# Patient Record
Sex: Male | Born: 1940 | Race: White | Hispanic: No | Smoking: Former smoker
Health system: Southern US, Community
[De-identification: ages and names within clinical notes are randomized; demographics above are authoritative.]

## PROBLEM LIST (undated history)

## (undated) DIAGNOSIS — M109 Gout, unspecified: Secondary | ICD-10-CM

## (undated) DIAGNOSIS — G4700 Insomnia, unspecified: Secondary | ICD-10-CM

## (undated) DIAGNOSIS — K219 Gastro-esophageal reflux disease without esophagitis: Secondary | ICD-10-CM

## (undated) DIAGNOSIS — I700 Atherosclerosis of aorta: Secondary | ICD-10-CM

## (undated) DIAGNOSIS — E669 Obesity, unspecified: Secondary | ICD-10-CM

## (undated) DIAGNOSIS — I7121 Aneurysm of the ascending aorta, without rupture: Secondary | ICD-10-CM

## (undated) DIAGNOSIS — R9431 Abnormal electrocardiogram [ECG] [EKG]: Secondary | ICD-10-CM

## (undated) DIAGNOSIS — F419 Anxiety disorder, unspecified: Secondary | ICD-10-CM

## (undated) DIAGNOSIS — I2510 Atherosclerotic heart disease of native coronary artery without angina pectoris: Secondary | ICD-10-CM

## (undated) DIAGNOSIS — I10 Essential (primary) hypertension: Secondary | ICD-10-CM

## (undated) DIAGNOSIS — Z87442 Personal history of urinary calculi: Secondary | ICD-10-CM

## (undated) DIAGNOSIS — E785 Hyperlipidemia, unspecified: Secondary | ICD-10-CM

## (undated) DIAGNOSIS — I712 Thoracic aortic aneurysm, without rupture: Secondary | ICD-10-CM

## (undated) HISTORY — DX: Abnormal electrocardiogram (ECG) (EKG): R94.31

## (undated) HISTORY — DX: Thoracic aortic aneurysm, without rupture: I71.2

## (undated) HISTORY — DX: Anxiety disorder, unspecified: F41.9

## (undated) HISTORY — DX: Atherosclerotic heart disease of native coronary artery without angina pectoris: I25.10

## (undated) HISTORY — PX: KIDNEY STONE SURGERY: SHX686

## (undated) HISTORY — DX: Hyperlipidemia, unspecified: E78.5

## (undated) HISTORY — DX: Aneurysm of the ascending aorta, without rupture: I71.21

## (undated) HISTORY — DX: Personal history of urinary calculi: Z87.442

## (undated) HISTORY — DX: Essential (primary) hypertension: I10

## (undated) HISTORY — DX: Insomnia, unspecified: G47.00

## (undated) HISTORY — DX: Gout, unspecified: M10.9

## (undated) HISTORY — DX: Obesity, unspecified: E66.9

## (undated) HISTORY — DX: Gastro-esophageal reflux disease without esophagitis: K21.9

## (undated) HISTORY — PX: HERNIA REPAIR: SHX51

## (undated) HISTORY — DX: Atherosclerosis of aorta: I70.0

---

## 1999-01-18 ENCOUNTER — Ambulatory Visit (HOSPITAL_COMMUNITY): Admission: RE | Admit: 1999-01-18 | Discharge: 1999-01-18 | Payer: Self-pay | Admitting: Urology

## 1999-01-18 ENCOUNTER — Encounter: Payer: Self-pay | Admitting: Urology

## 2001-07-06 ENCOUNTER — Observation Stay (HOSPITAL_COMMUNITY): Admission: EM | Admit: 2001-07-06 | Discharge: 2001-07-07 | Payer: Self-pay | Admitting: Emergency Medicine

## 2001-07-06 ENCOUNTER — Encounter: Payer: Self-pay | Admitting: Emergency Medicine

## 2001-07-09 ENCOUNTER — Encounter: Admission: RE | Admit: 2001-07-09 | Discharge: 2001-07-09 | Payer: Self-pay | Admitting: Family Medicine

## 2001-09-30 ENCOUNTER — Other Ambulatory Visit: Admission: RE | Admit: 2001-09-30 | Discharge: 2001-09-30 | Payer: Self-pay | Admitting: Gastroenterology

## 2001-09-30 ENCOUNTER — Encounter (INDEPENDENT_AMBULATORY_CARE_PROVIDER_SITE_OTHER): Payer: Self-pay | Admitting: Specialist

## 2007-08-18 ENCOUNTER — Ambulatory Visit (HOSPITAL_COMMUNITY): Admission: RE | Admit: 2007-08-18 | Discharge: 2007-08-18 | Payer: Self-pay | Admitting: Orthopedic Surgery

## 2010-08-01 ENCOUNTER — Ambulatory Visit (HOSPITAL_COMMUNITY): Admission: RE | Admit: 2010-08-01 | Discharge: 2010-08-01 | Payer: Self-pay | Admitting: Ophthalmology

## 2010-08-22 ENCOUNTER — Ambulatory Visit (HOSPITAL_COMMUNITY): Admission: RE | Admit: 2010-08-22 | Discharge: 2010-08-22 | Payer: Self-pay | Admitting: Ophthalmology

## 2011-02-14 LAB — HEMOGLOBIN AND HEMATOCRIT, BLOOD
HCT: 39.7 % (ref 39.0–52.0)
Hemoglobin: 13.4 g/dL (ref 13.0–17.0)

## 2011-02-14 LAB — BASIC METABOLIC PANEL
BUN: 11 mg/dL (ref 6–23)
CO2: 26 mEq/L (ref 19–32)
Calcium: 9.1 mg/dL (ref 8.4–10.5)
Chloride: 105 mEq/L (ref 96–112)
Creatinine, Ser: 1 mg/dL (ref 0.4–1.5)
GFR calc Af Amer: >60 mL/min (ref >60)
GFR calc non Af Amer: >60 mL/min (ref >60)
Glucose, Bld: 96 mg/dL (ref 70–99)
Potassium: 3.8 mEq/L (ref 3.5–5.1)
Sodium: 138 mEq/L (ref 135–145)

## 2011-04-15 NOTE — Op Note (Signed)
NAMESTEPFON, Joseph [name]                  ACCOUNT NO.:  [id number]   MEDICAL RECORD NO.:  [id number]          PATIENT TYPE:  AMB   LOCATION:  DAY                          FACILITY:  [facility]   PHYSICIAN:  Ronald A. Gioffre, M.D.DATE OF BIRTH:  January 18, 1941   DATE OF PROCEDURE:  08/18/2007  DATE OF DISCHARGE:                               OPERATIVE REPORT   SURGEON:  Dr. Darrelyn Hillock.   ASSISTANT:  Nurse.   PREOPERATIVE DIAGNOSES:  1. Partial tear of the quadriceps tendon right knee.  2. Torn medial meniscus right knee.  3. 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   POSTOPERATIVE DIAGNOSES:  1. Partial tear of the quadriceps tendon right knee.  2. Torn medial meniscus right knee.  3. 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   OPERATION:  1. Diagnostic arthroscopy right knee.  2. Medial meniscectomy right knee.  3. Abrasion chondroplasty medial femoral condyle right knee.   DESCRIPTION OF PROCEDURE:  Under general anesthesia, routine orthopedic  prep and drape of the right lower extremity was carried out.  He had 1  gram of IV Ancef.  A small punctate incision was made in the  suprapatellar pouch, inflow cannula was inserted, knee was distended  with saline.  Another small punctate incision made in the anterolateral  joint.  The arthroscope was entered from a lateral approach and a  complete diagnostic arthroscopy was carried out.  I went up into the  suprapatellar pouch, there was no obvious complete disruption of the  quadriceps tendon.  MRI showed the same. It just showed a small tear  that was really a nonoperative problem. We went down to the lateral  joint.  The lateral joint was clean, it looked fine, meniscus was  intact.  The anterior cruciate and posterior cruciates were definitely  intact. I probed those and took a photo of those.  I went over the  medial joint.  He had obvious degenerative changes in the medial femoral  condyle or it could have just been  a chondral-type defect from the fall  that he had.  Anyway, I introduced the shaver suction device and did an  abrasion chondroplasty.  Following that I went down and probed the  medial meniscus. As the medial meniscus was split from the posterior up  anteriorly, I simply shaved out the torn portion and left the normal  portion intact.  I probed the rim of the meniscus as well as the entire  meniscus and made sure there were no loose pieces, there were not.  I  thoroughly irrigated out the area, removed the fluid, closed all three  punctate incisions with 3-0 nylon suture and injected 30 mL of 0.5%  Marcaine with epinephrine in the knee joint and a sterile Neosporin  dressing was applied.  1. Postop he will be on aspirin 325 mg one twice a day starting today      and for a 2 weeks.  2. He will be on Percocet 10/650 one every 4 hours p.r.n. for pain.  3. He will be on crutches, partial to full weightbearing.  4. I will see him in 10-12 days for follow-up.           ______________________________  Georges Lynch. Darrelyn Hillock, M.D.     RAG/MEDQ  D:  08/18/2007  T:  08/18/2007  Job:  [id number]

## 2011-04-18 NOTE — Discharge Summary (Signed)
[place]  Patient:    Joseph [name], Joseph [name]                         MRN: [id number] Adm. Date:  [id number] Disc. Date: [id number] Attending:  Doneta Public Dictator:   Michell Heinrich, M.D. CC:         Monica Becton, M.D., [facility].   Discharge Summary  ADMISSION DIAGNOSIS:  Atypical chest pain.  DISCHARGE DIAGNOSIS:  Atypical chest pain, ruled out for myocardial infarction.  DISCHARGE MEDICATIONS: 1. Protonix 40 mg 1 tablet daily. 2. Aspirin 81 mg 1 tablet daily.  CONSULTATIONS:  None.  PROCEDURES:  Two-dimensional echo done on July 07, 2001, to evaluate ejection fraction and wall motion.  Results are pending at the time of discharge.  HISTORY OF PRESENT ILLNESS:  For complete H&P, please see resident H&P on chart.  Briefly, this is a 70 year old white man with no prior cardiac history.  He presented from [facility] with complaint of chest pain and shortness of breath.  Initial evaluation at [facility] did reveal asymmetrical right lower extremity swelling, and there was worry about possible pulmonary embolism.  He was evaluated in our emergency department and received a spiral CT to rule out pulmonary embolism.  The spiral CT showed no pulmonary embolism and extended down to the legs and ruled out DVT.  Further evaluation in the emergency department consisted of cardiac enzymes and an EKG.  Cardiac enzymes were negative, and EKG showed T-wave inversion in lead V1 and in lead III, which matched an EKG which was done earlier in the day at [facility]. No prior EKG for comparison.  Given the presentation and the patients age and family history of coronary artery disease, he was admitted for further rule out of myocardial infarction and possible risk stratification.  HOSPITAL COURSE: #1 - CHEST PAIN:  The patient became chest pain-free without nitroglycerin  or morphine.  He was admitted to a telemetry unit, and serial cardiac enzymes were obtained which were all negative.  Repeat EKG on the morning after admission was unchanged from admission EKG.  Due to his history of increasing shortness of breath with exertion, there was worry about possible mild heart failure symptoms.  For this reason, he was sent for a 2-D echocardiogram. This was done prior to his discharge, but the results were pending at the time of discharge.  Results of a fasting lipid panel were also pending at the time of discharge.  It was believed that the patients chest pain could have been GI in nature, and the patient was placed on Protonix at discharge.  He was also told to continue his 81 mg aspirin per day.  Appropriate follow-up would most likely consist of an outpatient treadmill or Cardiolite stress test to further assess for coronary artery disease.  DISPOSITION:  The patient was discharged to home.  CONDITION ON DISCHARGE:  Improved.  DISCHARGE INSTRUCTIONS:  He was instructed that if he should have recurrent chest pain or shortness of breath he should seek medical attention.  FOLLOW-UP:  He was instructed to call Dr. Christell Constant at [facility] for further follow-up appointment and, at that time, should discuss cardiology referral/stress test with him. DD:  07/07/01 TD:  07/09/01 Job: [id number]

## 2011-09-11 LAB — BASIC METABOLIC PANEL
BUN: 11
Creatinine, Ser: 0.93
GFR calc Af Amer: >60
GFR calc non Af Amer: >60
Potassium: 3.7

## 2011-09-11 LAB — HEMOGLOBIN AND HEMATOCRIT, BLOOD
HCT: 41.4
Hemoglobin: 14.3

## 2011-09-11 LAB — PROTIME-INR: INR: 0.9

## 2013-03-22 ENCOUNTER — Ambulatory Visit (INDEPENDENT_AMBULATORY_CARE_PROVIDER_SITE_OTHER): Payer: Medicare Other | Admitting: Nurse Practitioner

## 2013-03-22 ENCOUNTER — Encounter: Payer: Self-pay | Admitting: Nurse Practitioner

## 2013-03-22 VITALS — BP 115/70 | HR 76 | Temp 96.9°F | Ht 67.0 in | Wt 257.0 lb

## 2013-03-22 DIAGNOSIS — R972 Elevated prostate specific antigen [PSA]: Secondary | ICD-10-CM

## 2013-03-22 DIAGNOSIS — I10 Essential (primary) hypertension: Secondary | ICD-10-CM | POA: Insufficient documentation

## 2013-03-22 DIAGNOSIS — E785 Hyperlipidemia, unspecified: Secondary | ICD-10-CM | POA: Insufficient documentation

## 2013-03-22 DIAGNOSIS — G4700 Insomnia, unspecified: Secondary | ICD-10-CM

## 2013-03-22 LAB — COMPLETE METABOLIC PANEL WITH GFR
AST: 19 U/L (ref 0–37)
Albumin: 4.1 g/dL (ref 3.5–5.2)
Alkaline Phosphatase: 55 U/L (ref 39–117)
GFR, Est Non African American: 78 mL/min
Glucose, Bld: 103 mg/dL — ABNORMAL HIGH (ref 70–99)
Potassium: 4.6 mEq/L (ref 3.5–5.3)
Sodium: 139 mEq/L (ref 135–145)
Total Protein: 6.9 g/dL (ref 6.0–8.3)

## 2013-03-22 MED ORDER — ZOLPIDEM TARTRATE 10 MG PO TABS: 10.0000 mg | ORAL_TABLET | Freq: Every evening | ORAL | Status: DC | PRN

## 2013-03-22 NOTE — Progress Notes (Signed)
  Subjective:    Patient ID: Joseph [name], male    DOB: 1941/04/05, 72 y.o.   MRN: [id number]  Hypertension This is a chronic problem. The current episode started more than 1 year ago. The problem is unchanged. The problem is controlled. Pertinent negatives include no chest pain, headaches, malaise/fatigue, neck pain, palpitations, peripheral edema or shortness of breath. There are no associated agents to hypertension. Risk factors for coronary artery disease include male gender, obesity, sedentary lifestyle and dyslipidemia. Past treatments include ACE inhibitors. The current treatment provides significant improvement. Compliance problems include diet and exercise.   Hyperlipidemia This is a chronic problem. The current episode started more than 1 year ago. The problem is uncontrolled. Recent lipid tests were reviewed and are high. Exacerbating diseases include obesity. Factors aggravating his hyperlipidemia include thiazides. Pertinent negatives include no chest pain, leg pain, myalgias or shortness of breath. He is currently on no antihyperlipidemic treatment (patient stopped taking because made him hurt). The current treatment provides moderate improvement of lipids. Compliance problems include adherence to diet and adherence to exercise.  Risk factors for coronary artery disease include male sex and obesity.  INsomnia Ambien to sleep. Works well most of the time. Feels rested upon awakening High normal PSA level Patient denies any trouble passing water. Stream weaker than use to be. Has seen Dr. Annabell Howells in the past.    Review of Systems  Constitutional: Negative for malaise/fatigue.  HENT: Negative for neck pain.   Respiratory: Negative for shortness of breath.   Cardiovascular: Negative for chest pain and palpitations.  Musculoskeletal: Negative for myalgias.  Neurological: Negative for headaches.  All other systems reviewed and are negative.       Objective:   Physical Exam   Constitutional: He is oriented to person, place, and time. He appears well-developed and well-nourished.  HENT:  Head: Normocephalic.  Right Ear: External ear normal.  Left Ear: External ear normal.  Nose: Nose normal.  Mouth/Throat: Oropharynx is clear and moist.  Eyes: EOM are normal. Pupils are equal, round, and reactive to light.  Neck: Normal range of motion. Neck supple. No thyromegaly present.  Cardiovascular: Normal rate, regular rhythm, normal heart sounds and intact distal pulses.   No murmur heard. Pulmonary/Chest: Effort normal and breath sounds normal. He has no wheezes. He has no rales.  Abdominal: Soft. Bowel sounds are normal.  Genitourinary:  Prostate exam deferred to urologist  Musculoskeletal: Normal range of motion.  Neurological: He is alert and oriented to person, place, and time.  Skin: Skin is warm and dry.  Psychiatric: He has a normal mood and affect. His behavior is normal. Judgment and thought content normal.   BP 115/70  Pulse 76  Temp(Src) 96.9 F (36.1 C) (Oral)  Ht 5\' 7"  (1.702 m)  Wt 257 lb (116.574 kg)  BMI 40.24 kg/m2        Assessment & Plan:  1. Essential hypertension, benign Low Na+ diet - COMPLETE METABOLIC PANEL WITH GFR  2. Other and unspecified hyperlipidemia Low fat diet and exercise - NMR Lipoprofile with Lipids  3. Insomnia Bedtime routine - zolpidem (AMBIEN) 10 MG tablet; Take 1 tablet (10 mg total) by mouth at bedtime as needed for sleep.  Dispense: 30 tablet; Refill: 2  4. Rising PSA level Referral to Dr. Annabell Howells - PSA Mary-Margaret Daphine Deutscher, FNP

## 2013-03-22 NOTE — Patient Instructions (Signed)
Prostate-Specific Antigen The prostate-specific antigen (PSA) is a blood test. It is used to help detect early forms of prostate cancer. The test is usually used along with other tests. The test is also used to follow the course of those who already have prostate cancer or who have been treated for prostate cancer. Some factors interfere with the results of the PSA. The factors listed below will either increase or decrease the PSA levels. They are:  Prescriptions used for male baldness.  Some herbs.  Active prostate infection.  Prior instrumentation or urinary catheterization.  Ejaculation up to 2 days prior to testing.  A noncancerous enlargement of the prostate.  Inflammation of the prostate.  Active urinary tract infection. If your test results are elevated, your caregiver will discuss the results with you. Your caregiver will also let you know if more evaluation is needed. PREPARATION FOR TEST No preparation or fasting is necessary. NORMAL FINDINGS Less than 4 ng/mL or Less than 35mcg/L (SI units) Ranges for normal findings may vary among different laboratories and hospitals. You should always check with your caregiver after having lab work or other tests done to discuss the meaning of your test results and whether your values are considered within normal limits. MEANING OF TEST  A normal value means prostate cancer is less likely. The chance of having prostate cancer increases if the value is between 4 ng/mL and 10 ng/mL. However, further testing will be needed. Values above 10 ng/mL indicate that there is a much higher chance of having prostate cancer (if the above situations that raise PSA are not present). Your caregiver will go over your test results with you and discuss the importance of this test. If this value is elevated, your caregiver may recommend further testing or evaluation. OBTAINING THE TEST RESULTS It is your responsibility to obtain your test results. Ask the lab or  department performing the test when and how you will get your results. Document Released: 12/20/2004 Document Revised: 02/09/2012 Document Reviewed: 06/25/2007 [facility] Patient Information 2013 [place], [place].

## 2013-03-23 ENCOUNTER — Other Ambulatory Visit: Payer: Self-pay | Admitting: Nurse Practitioner

## 2013-03-23 LAB — NMR LIPOPROFILE WITH LIPIDS
Cholesterol, Total: 208 mg/dL — ABNORMAL HIGH (ref <200)
Large HDL-P: 1.3 umol/L — ABNORMAL LOW (ref >=4.8)
Large VLDL-P: 5 nmol/L — ABNORMAL HIGH (ref <=2.7)
Triglycerides: 163 mg/dL — ABNORMAL HIGH (ref <150)

## 2013-03-23 MED ORDER — ATORVASTATIN CALCIUM 40 MG PO TABS: 40.0000 mg | ORAL_TABLET | Freq: Every day | ORAL | Status: DC

## 2013-03-23 NOTE — Progress Notes (Signed)
PATIENT DOES NOT TAKE LIPTOR I SHEERED RX

## 2013-03-24 ENCOUNTER — Telehealth: Payer: Self-pay | Admitting: Nurse Practitioner

## 2013-03-24 NOTE — Telephone Encounter (Signed)
Patient aware calling back on labs

## 2013-03-24 NOTE — Progress Notes (Signed)
Called patient on labs

## 2013-04-06 ENCOUNTER — Other Ambulatory Visit: Payer: Self-pay | Admitting: Nurse Practitioner

## 2013-04-06 MED ORDER — ROSUVASTATIN CALCIUM 10 MG PO TABS: 10.0000 mg | ORAL_TABLET | Freq: Every day | ORAL | Status: DC

## 2013-04-06 NOTE — Telephone Encounter (Signed)
Pt aware and rx sent in 

## 2013-04-09 ENCOUNTER — Telehealth: Payer: Self-pay | Admitting: Nurse Practitioner

## 2013-04-12 ENCOUNTER — Other Ambulatory Visit: Payer: Self-pay | Admitting: Nurse Practitioner

## 2013-04-12 MED ORDER — ATORVASTATIN CALCIUM 40 MG PO TABS: 40.0000 mg | ORAL_TABLET | Freq: Every day | ORAL | Status: DC

## 2013-04-13 NOTE — Telephone Encounter (Signed)
Patient aware.

## 2013-04-13 NOTE — Telephone Encounter (Signed)
lipitor RX sent in on 04/12/13

## 2013-04-29 ENCOUNTER — Ambulatory Visit (INDEPENDENT_AMBULATORY_CARE_PROVIDER_SITE_OTHER): Payer: Medicare Other | Admitting: Urology

## 2013-04-29 DIAGNOSIS — N509 Disorder of male genital organs, unspecified: Secondary | ICD-10-CM

## 2013-04-29 DIAGNOSIS — N138 Other obstructive and reflux uropathy: Secondary | ICD-10-CM

## 2013-04-29 DIAGNOSIS — R351 Nocturia: Secondary | ICD-10-CM

## 2013-04-29 DIAGNOSIS — R972 Elevated prostate specific antigen [PSA]: Secondary | ICD-10-CM

## 2013-04-29 DIAGNOSIS — N401 Enlarged prostate with lower urinary tract symptoms: Secondary | ICD-10-CM

## 2013-05-30 ENCOUNTER — Other Ambulatory Visit (INDEPENDENT_AMBULATORY_CARE_PROVIDER_SITE_OTHER): Payer: Medicare Other

## 2013-05-30 DIAGNOSIS — R7309 Other abnormal glucose: Secondary | ICD-10-CM

## 2013-05-30 DIAGNOSIS — R7989 Other specified abnormal findings of blood chemistry: Secondary | ICD-10-CM

## 2013-05-30 LAB — GLUCOSE, POCT (MANUAL RESULT ENTRY)

## 2013-06-22 ENCOUNTER — Other Ambulatory Visit: Payer: Self-pay | Admitting: Nurse Practitioner

## 2013-06-23 NOTE — Telephone Encounter (Signed)
Last seen and filled 03/22/13   MMM   If approved route to nurse and call in

## 2013-06-24 NOTE — Telephone Encounter (Signed)
Called into pharmacy and left on voicemail 

## 2013-06-29 ENCOUNTER — Ambulatory Visit (INDEPENDENT_AMBULATORY_CARE_PROVIDER_SITE_OTHER): Payer: Medicare Other | Admitting: Nurse Practitioner

## 2013-06-29 ENCOUNTER — Encounter: Payer: Self-pay | Admitting: Nurse Practitioner

## 2013-06-29 VITALS — BP 134/82 | HR 66 | Temp 97.8°F | Ht 67.0 in | Wt 250.5 lb

## 2013-06-29 DIAGNOSIS — G4700 Insomnia, unspecified: Secondary | ICD-10-CM

## 2013-06-29 DIAGNOSIS — E785 Hyperlipidemia, unspecified: Secondary | ICD-10-CM

## 2013-06-29 DIAGNOSIS — I10 Essential (primary) hypertension: Secondary | ICD-10-CM

## 2013-06-29 DIAGNOSIS — N400 Enlarged prostate without lower urinary tract symptoms: Secondary | ICD-10-CM

## 2013-06-29 MED ORDER — ZOLPIDEM TARTRATE 10 MG PO TABS: 10.0000 mg | ORAL_TABLET | Freq: Every evening | ORAL | Status: DC | PRN

## 2013-06-29 MED ORDER — BENAZEPRIL HCL 20 MG PO TABS: 20.0000 mg | ORAL_TABLET | Freq: Every day | ORAL | Status: DC

## 2013-06-29 MED ORDER — ATORVASTATIN CALCIUM 40 MG PO TABS: 40.0000 mg | ORAL_TABLET | Freq: Every day | ORAL | Status: DC

## 2013-06-29 MED ORDER — FINASTERIDE 5 MG PO TABS: 5.0000 mg | ORAL_TABLET | Freq: Every day | ORAL | Status: DC

## 2013-06-29 MED ORDER — HYDROCHLOROTHIAZIDE 25 MG PO TABS: 25.0000 mg | ORAL_TABLET | Freq: Every day | ORAL | Status: DC

## 2013-06-29 NOTE — Progress Notes (Signed)
Subjective:    Patient ID: Joseph [name], male    DOB: 1941-03-05, 72 y.o.   MRN: [id number]  Hypertension This is a chronic problem. The current episode started more than 1 year ago. The problem has been resolved since onset. The problem is controlled. Associated symptoms include anxiety. Pertinent negatives include no blurred vision, headaches, malaise/fatigue, orthopnea, palpitations, peripheral edema, shortness of breath or sweats. There are no associated agents to hypertension. Risk factors for coronary artery disease include dyslipidemia, obesity and male gender. Past treatments include ACE inhibitors and diuretics. The current treatment provides moderate improvement. Compliance problems include diet and exercise.   Hyperlipidemia This is a chronic problem. The current episode started more than 1 year ago. The problem is uncontrolled. Recent lipid tests were reviewed and are high. Exacerbating diseases include obesity. He has no history of diabetes or hypothyroidism. Pertinent negatives include no shortness of breath. Current antihyperlipidemic treatment includes statins. The current treatment provides moderate improvement of lipids. Compliance problems include adherence to diet and adherence to exercise.  Risk factors for coronary artery disease include hypertension, male sex and obesity.  Insomnia Ambien- works well - can't sleep without it BPH Has appointment with Dr. Donnetta Hail in a month and he wants PSA level drawn- Voiding without difficulty   Review of Systems  Constitutional: Negative for malaise/fatigue.  Eyes: Negative for blurred vision.  Respiratory: Negative for shortness of breath.   Cardiovascular: Negative for palpitations and orthopnea.  Neurological: Negative for headaches.       Objective:   Physical Exam  Constitutional: He is oriented to person, place, and time. He appears well-developed and well-nourished.  HENT:  Head: Normocephalic.  Right Ear: External ear  normal.  Left Ear: External ear normal.  Nose: Nose normal.  Mouth/Throat: Oropharynx is clear and moist.  Eyes: EOM are normal. Pupils are equal, round, and reactive to light.  Neck: Normal range of motion. Neck supple. No thyromegaly present.  Cardiovascular: Normal rate, regular rhythm, normal heart sounds and intact distal pulses.   No murmur heard. Large varicose veins bilaterally   Pulmonary/Chest: Effort normal and breath sounds normal. He has no wheezes. He has no rales.  Abdominal: Soft. Bowel sounds are normal.  Musculoskeletal: Normal range of motion.  Neurological: He is alert and oriented to person, place, and time.  Skin: Skin is warm and dry.  Psychiatric: He has a normal mood and affect. His behavior is normal. Judgment and thought content normal.          Assessment & Plan:  1. Essential hypertension, benign Low Na diet and encourage excerise - CMP14+EGFR - hydrochlorothiazide (HYDRODIURIL) 25 MG tablet; Take 1 tablet (25 mg total) by mouth daily.  Dispense: 30 tablet; Refill: 6 - benazepril (LOTENSIN) 20 MG tablet; Take 1 tablet (20 mg total) by mouth daily.  Dispense: 30 tablet; Refill: 6  2. Other and unspecified hyperlipidemia Low fat diet - NMR, lipoprofile - atorvastatin (LIPITOR) 40 MG tablet; Take 1 tablet (40 mg total) by mouth daily.  Dispense: 30 tablet; Refill: 5  3. Insomnia Bed time ritual - zolpidem (AMBIEN) 10 MG tablet; Take 1 tablet (10 mg total) by mouth at bedtime as needed for sleep.  Dispense: 30 tablet; Refill: 2  4. BPH (benign prostatic hyperplasia) Keep follow up with Dr. Wilson Singer - PSA, total and free - finasteride (PROSCAR) 5 MG tablet; Take 1 tablet (5 mg total) by mouth daily.  Dispense: 30 tablet; Refill: 6  Health Maintenance discussed. Hemoccult cards given.  Mary-Margaret Hassell Done, FNP

## 2013-06-29 NOTE — Patient Instructions (Signed)
Health Maintenance, Males A healthy lifestyle and preventative care can promote health and wellness.  Maintain regular health, dental, and eye exams.  Eat a healthy diet. Foods like vegetables, fruits, whole grains, low-fat dairy products, and lean protein foods contain the nutrients you need without too many calories. Decrease your intake of foods high in solid fats, added sugars, and salt. Get information about a proper diet from your caregiver, if necessary.  Regular physical exercise is one of the most important things you can do for your health. Most adults should get at least 150 minutes of moderate-intensity exercise (any activity that increases your heart rate and causes you to sweat) each week. In addition, most adults need muscle-strengthening exercises on 2 or more days a week.   Maintain a healthy weight. The body mass index (BMI) is a screening tool to identify possible weight problems. It provides an estimate of body fat based on height and weight. Your caregiver can help determine your BMI, and can help you achieve or maintain a healthy weight. For adults 20 years and older:  A BMI below 18.5 is considered underweight.  A BMI of 18.5 to 24.9 is normal.  A BMI of 25 to 29.9 is considered overweight.  A BMI of 30 and above is considered obese.  Maintain normal blood lipids and cholesterol by exercising and minimizing your intake of saturated fat. Eat a balanced diet with plenty of fruits and vegetables. Blood tests for lipids and cholesterol should begin at age 20 and be repeated every 5 years. If your lipid or cholesterol levels are high, you are over 50, or you are a high risk for heart disease, you may need your cholesterol levels checked more frequently.Ongoing high lipid and cholesterol levels should be treated with medicines, if diet and exercise are not effective.  If you smoke, find out from your caregiver how to quit. If you do not use tobacco, do not start.  If you  choose to drink alcohol, do not exceed 2 drinks per day. One drink is considered to be 12 ounces (355 mL) of beer, 5 ounces (148 mL) of wine, or 1.5 ounces (44 mL) of liquor.  Avoid use of street drugs. Do not share needles with anyone. Ask for help if you need support or instructions about stopping the use of drugs.  High blood pressure causes heart disease and increases the risk of stroke. Blood pressure should be checked at least every 1 to 2 years. Ongoing high blood pressure should be treated with medicines if weight loss and exercise are not effective.  If you are 45 to 70 years old, ask your caregiver if you should take aspirin to prevent heart disease.  Diabetes screening involves taking a blood sample to check your fasting blood sugar level. This should be done once every 3 years, after age 45, if you are within normal weight and without risk factors for diabetes. Testing should be considered at a younger age or be carried out more frequently if you are overweight and have at least 1 risk factor for diabetes.  Colorectal cancer can be detected and often prevented. Most routine colorectal cancer screening begins at the age of 50 and continues through age 75. However, your caregiver may recommend screening at an earlier age if you have risk factors for colon cancer. On a yearly basis, your caregiver may provide home test kits to check for hidden blood in the stool. Use of a small camera at the end of a tube,   to directly examine the colon (sigmoidoscopy or colonoscopy), can detect the earliest forms of colorectal cancer. Talk to your caregiver about this at age 50, when routine screening begins. Direct examination of the colon should be repeated every 5 to 10 years through age 75, unless early forms of pre-cancerous polyps or small growths are found.  Hepatitis C blood testing is recommended for all people born from 1945 through 1965 and any individual with known risks for hepatitis C.  Healthy  men should no longer receive prostate-specific antigen (PSA) blood tests as part of routine cancer screening. Consult with your caregiver about prostate cancer screening.  Testicular cancer screening is not recommended for adolescents or adult males who have no symptoms. Screening includes self-exam, caregiver exam, and other screening tests. Consult with your caregiver about any symptoms you have or any concerns you have about testicular cancer.  Practice safe sex. Use condoms and avoid high-risk sexual practices to reduce the spread of sexually transmitted infections (STIs).  Use sunscreen with a sun protection factor (SPF) of 30 or greater. Apply sunscreen liberally and repeatedly throughout the day. You should seek shade when your shadow is shorter than you. Protect yourself by wearing long sleeves, pants, a wide-brimmed hat, and sunglasses year round, whenever you are outdoors.  Notify your caregiver of new moles or changes in moles, especially if there is a change in shape or color. Also notify your caregiver if a mole is larger than the size of a pencil eraser.  A one-time screening for abdominal aortic aneurysm (AAA) and surgical repair of large AAAs by sound wave imaging (ultrasonography) is recommended for ages 65 to 75 years who are current or former smokers.  Stay current with your immunizations. Document Released: 05/15/2008 Document Revised: 02/09/2012 Document Reviewed: 04/14/2011 [employer] Patient Information 2014 [employer].  

## 2013-06-30 ENCOUNTER — Other Ambulatory Visit (INDEPENDENT_AMBULATORY_CARE_PROVIDER_SITE_OTHER): Payer: Medicare Other

## 2013-06-30 DIAGNOSIS — Z1212 Encounter for screening for malignant neoplasm of rectum: Secondary | ICD-10-CM

## 2013-07-01 LAB — FECAL OCCULT BLOOD, IMMUNOCHEMICAL: Fecal Occult Bld: NEGATIVE

## 2013-07-05 LAB — NMR, LIPOPROFILE
Cholesterol: 179 mg/dL (ref <200)
HDL Particle Number: 31.8 umol/L (ref >=30.5)
LDL Particle Number: 1687 nmol/L — ABNORMAL HIGH (ref <1000)
LDLC SERPL CALC-MCNC: 111 mg/dL — ABNORMAL HIGH (ref <100)
Triglycerides by NMR: 129 mg/dL (ref <150)

## 2013-07-05 LAB — PSA, TOTAL AND FREE: PSA, Free Pct: 13.1 %

## 2013-07-05 LAB — CMP14+EGFR
ALT: 16 IU/L (ref 0–44)
Albumin: 3.9 g/dL (ref 3.5–4.8)
BUN: 10 mg/dL (ref 8–27)
Calcium: 9.7 mg/dL (ref 8.6–10.2)
Chloride: 100 mmol/L (ref 97–108)
GFR calc Af Amer: 96 mL/min/{1.73_m2} (ref >59)
GFR calc non Af Amer: 83 mL/min/{1.73_m2} (ref >59)
Glucose: 98 mg/dL (ref 65–99)
Potassium: 4.6 mmol/L (ref 3.5–5.2)
Total Protein: 6.6 g/dL (ref 6.0–8.5)

## 2013-07-06 ENCOUNTER — Other Ambulatory Visit: Payer: Self-pay | Admitting: Nurse Practitioner

## 2013-07-06 DIAGNOSIS — E785 Hyperlipidemia, unspecified: Secondary | ICD-10-CM

## 2013-07-06 MED ORDER — EZETIMIBE 10 MG PO TABS: 10.0000 mg | ORAL_TABLET | Freq: Every day | ORAL | Status: DC

## 2013-07-29 ENCOUNTER — Ambulatory Visit (INDEPENDENT_AMBULATORY_CARE_PROVIDER_SITE_OTHER): Payer: Medicare Other | Admitting: Urology

## 2013-07-29 DIAGNOSIS — N401 Enlarged prostate with lower urinary tract symptoms: Secondary | ICD-10-CM

## 2013-07-29 DIAGNOSIS — R6882 Decreased libido: Secondary | ICD-10-CM

## 2013-07-29 DIAGNOSIS — N529 Male erectile dysfunction, unspecified: Secondary | ICD-10-CM

## 2013-08-19 ENCOUNTER — Ambulatory Visit (INDEPENDENT_AMBULATORY_CARE_PROVIDER_SITE_OTHER): Payer: Medicare Other | Admitting: Family Medicine

## 2013-08-19 VITALS — BP 119/74 | HR 84 | Temp 97.5°F | Wt 257.0 lb

## 2013-08-19 DIAGNOSIS — L0390 Cellulitis, unspecified: Secondary | ICD-10-CM

## 2013-08-19 DIAGNOSIS — R21 Rash and other nonspecific skin eruption: Secondary | ICD-10-CM

## 2013-08-19 DIAGNOSIS — L0291 Cutaneous abscess, unspecified: Secondary | ICD-10-CM

## 2013-08-19 MED ORDER — NYSTATIN-TRIAMCINOLONE 100000-0.1 UNIT/GM-% EX OINT: TOPICAL_OINTMENT | Freq: Two times a day (BID) | CUTANEOUS | Status: DC

## 2013-08-19 MED ORDER — CEPHALEXIN 500 MG PO CAPS: 500.0000 mg | ORAL_CAPSULE | Freq: Four times a day (QID) | ORAL | Status: DC

## 2013-08-19 NOTE — Progress Notes (Signed)
  Subjective:    Patient ID: Joseph [name], male    DOB: 30-Jul-1941, 72 y.o.   MRN: [id number]  HPI This 72 y.o. male presents for evaluation of rash on his left ankle.  He was working In the yard last week and he noticed a rash on his left ankle.  He states he gets some Itching and he put some hydrocortisone cream on it.  He states it did have fever in it but is now Getting better..   Review of Systems C/o rash No chest pain, SOB, HA, dizziness, vision change, N/V, diarrhea, constipation, dysuria, urinary urgency or frequency, myalgias, arthralgias or rash.     Objective:   Physical Exam Vital signs noted  Well developed well nourished male.  HEENT - Head atraumatic Normocephalic Respiratory - Lungs CTA bilateral Cardiac - RRR S1 and S2 without murmur Skin - Anular erythematous rash on left ankle.       Assessment & Plan:  Cellulitis - Plan: cephALEXin (KEFLEX) 500 MG capsule, nystatin-triamcinolone ointment (MYCOLOG)  Rash and nonspecific skin eruption - Plan: nystatin-triamcinolone ointment (MYCOLOG)  Follow up prn if rash persist or worsens.

## 2013-08-19 NOTE — Patient Instructions (Signed)

## 2013-09-30 ENCOUNTER — Ambulatory Visit (INDEPENDENT_AMBULATORY_CARE_PROVIDER_SITE_OTHER): Payer: Medicare Other | Admitting: Nurse Practitioner

## 2013-09-30 ENCOUNTER — Encounter: Payer: Self-pay | Admitting: Nurse Practitioner

## 2013-09-30 VITALS — BP 131/76 | HR 68 | Temp 97.4°F | Ht 67.0 in | Wt 250.0 lb

## 2013-09-30 DIAGNOSIS — G4700 Insomnia, unspecified: Secondary | ICD-10-CM

## 2013-09-30 DIAGNOSIS — E785 Hyperlipidemia, unspecified: Secondary | ICD-10-CM

## 2013-09-30 DIAGNOSIS — I10 Essential (primary) hypertension: Secondary | ICD-10-CM

## 2013-09-30 MED ORDER — ATORVASTATIN CALCIUM 40 MG PO TABS: 40.0000 mg | ORAL_TABLET | Freq: Every day | ORAL | Status: DC

## 2013-09-30 MED ORDER — BENAZEPRIL HCL 20 MG PO TABS: 20.0000 mg | ORAL_TABLET | Freq: Every day | ORAL | Status: DC

## 2013-09-30 MED ORDER — ZOLPIDEM TARTRATE 10 MG PO TABS: 10.0000 mg | ORAL_TABLET | Freq: Every evening | ORAL | Status: DC | PRN

## 2013-09-30 MED ORDER — HYDROCHLOROTHIAZIDE 25 MG PO TABS: 25.0000 mg | ORAL_TABLET | Freq: Every day | ORAL | Status: DC

## 2013-09-30 NOTE — Progress Notes (Signed)
Subjective:    Patient ID: Joseph [name], male    DOB: 1941/06/13, 72 y.o.   MRN: [id number]  Hypertension This is a chronic problem. The current episode started more than 1 year ago. The problem has been resolved since onset. The problem is controlled. Associated symptoms include anxiety and palpitations. Pertinent negatives include no blurred vision, headaches, malaise/fatigue, orthopnea, peripheral edema, shortness of breath or sweats. There are no associated agents to hypertension. Risk factors for coronary artery disease include dyslipidemia, obesity and male gender. Past treatments include ACE inhibitors and diuretics. The current treatment provides moderate improvement. Compliance problems include diet and exercise.  There is no history of a thyroid problem. There is no history of sleep apnea.  Hyperlipidemia This is a chronic problem. The current episode started more than 1 year ago. The problem is uncontrolled. Recent lipid tests were reviewed and are high. Exacerbating diseases include obesity. He has no history of diabetes or hypothyroidism. Pertinent negatives include no shortness of breath. Current antihyperlipidemic treatment includes statins. The current treatment provides moderate improvement of lipids. Compliance problems include adherence to diet and adherence to exercise.  Risk factors for coronary artery disease include hypertension, male sex and obesity.  Insomnia Ambien- works well - can't sleep without it BPH Pt sees Dr. Donnetta Hail and he took pt off proscar-Pt was developing breast, wt gain, and decreased sex drive- Voiding without difficulty   Review of Systems  Constitutional: Negative for malaise/fatigue.  Eyes: Negative for blurred vision.  Respiratory: Negative for shortness of breath.   Cardiovascular: Positive for palpitations. Negative for orthopnea.  Neurological: Negative for headaches.  All other systems reviewed and are negative.       Objective:   Physical  Exam  Constitutional: He is oriented to person, place, and time. He appears well-developed and well-nourished.  HENT:  Head: Normocephalic.  Right Ear: External ear normal.  Left Ear: External ear normal.  Nose: Nose normal.  Mouth/Throat: Oropharynx is clear and moist.  Eyes: EOM are normal. Pupils are equal, round, and reactive to light.  Neck: Normal range of motion. Neck supple. No thyromegaly present.  Cardiovascular: Normal rate, regular rhythm, normal heart sounds and intact distal pulses.   No murmur heard.    Pulmonary/Chest: Effort normal and breath sounds normal. He has no wheezes. He has no rales.  Abdominal: Soft. Bowel sounds are normal.  Musculoskeletal: Normal range of motion.  Neurological: He is alert and oriented to person, place, and time.  Skin: Skin is warm and dry.  Psychiatric: He has a normal mood and affect. His behavior is normal. Judgment and thought content normal.    BP 131/76  Pulse 68  Temp(Src) 97.4 F (36.3 C) (Oral)  Ht 5\' 7"  (1.702 m)  Wt 250 lb (113.399 kg)  BMI 39.15 kg/m2       Assessment & Plan:   1. Hypertension   2. Insomnia   3. Hyperlipidemia LDL goal < 100   4. Other and unspecified hyperlipidemia   5. Essential hypertension, benign    Orders Placed This Encounter  Procedures  . CMP14+EGFR  . NMR, lipoprofile   Meds ordered this encounter  Medications  . atorvastatin (LIPITOR) 40 MG tablet    Sig: Take 1 tablet (40 mg total) by mouth daily.    Dispense:  90 tablet    Refill:  3    Order Specific Question:  Supervising Provider    Answer:  Ernestina Penna [[id number]]  . hydrochlorothiazide (HYDRODIURIL) 25 MG  tablet    Sig: Take 1 tablet (25 mg total) by mouth daily.    Dispense:  90 tablet    Refill:  3    Order Specific Question:  Supervising Provider    Answer:  Ernestina Penna [[id number]]  . benazepril (LOTENSIN) 20 MG tablet    Sig: Take 1 tablet (20 mg total) by mouth daily.    Dispense:  90 tablet    Refill:  3     Order Specific Question:  Supervising Provider    Answer:  Ernestina Penna [[id number]]  . zolpidem (AMBIEN) 10 MG tablet    Sig: Take 1 tablet (10 mg total) by mouth at bedtime as needed for sleep.    Dispense:  30 tablet    Refill:  2    Order Specific Question:  Supervising Provider    Answer:  Deborra Medina    Continue all meds Labs pending Diet and exercise encouraged Health maintenance reviewed Follow up in 6 months  Mary-Margaret Daphine Deutscher, FNP

## 2013-09-30 NOTE — Patient Instructions (Signed)
Palpitations  A palpitation is the feeling that your heartbeat is irregular or is faster than normal. It may feel like your heart is fluttering or skipping a beat. Palpitations are usually not a serious problem. However, in some cases, you may need further medical evaluation. CAUSES  Palpitations can be caused by:  Smoking.  Caffeine or other stimulants, such as diet pills or energy drinks.  Alcohol.  Stress and anxiety.  Strenuous physical activity.  Fatigue.  Certain medicines.  Heart disease, especially if you have a history of arrhythmias. This includes atrial fibrillation, atrial flutter, or supraventricular tachycardia.  An improperly working pacemaker or defibrillator. DIAGNOSIS  To find the cause of your palpitations, your caregiver will take your history and perform a physical exam. Tests may also be done, including:  Electrocardiography (ECG). This test records the heart's electrical activity.  Cardiac monitoring. This allows your caregiver to monitor your heart rate and rhythm in real time.  Holter monitor. This is a portable device that records your heartbeat and can help diagnose heart arrhythmias. It allows your caregiver to track your heart activity for several days, if needed.  Stress tests by exercise or by giving medicine that makes the heart beat faster. TREATMENT  Treatment of palpitations depends on the cause of your symptoms and can vary greatly. Most cases of palpitations do not require any treatment other than time, relaxation, and monitoring your symptoms. Other causes, such as atrial fibrillation, atrial flutter, or supraventricular tachycardia, usually require further treatment. HOME CARE INSTRUCTIONS   Avoid:  Caffeinated coffee, tea, soft drinks, diet pills, and energy drinks.  Chocolate.  Alcohol.  Stop smoking if you smoke.  Reduce your stress and anxiety. Things that can help you relax include:  A method that measures bodily functions so  you can learn to control them (biofeedback).  Yoga.  Meditation.  Physical activity such as swimming, jogging, or walking.  Get plenty of rest and sleep. SEEK MEDICAL CARE IF:   You continue to have a fast or irregular heartbeat beyond 24 hours.  Your palpitations occur more often. SEEK IMMEDIATE MEDICAL CARE IF:  You develop chest pain or shortness of breath.  You have a severe headache.  You feel dizzy, or you faint. MAKE SURE YOU:  Understand these instructions.  Will watch your condition.  Will get help right away if you are not doing well or get worse. Document Released: 11/14/2000 Document Revised: 05/18/2012 Document Reviewed: 01/16/2012 [employer] Patient Information 2014 [employer].  

## 2013-10-02 LAB — CMP14+EGFR
ALT: 15 IU/L (ref 0–44)
AST: 19 IU/L (ref 0–40)
Alkaline Phosphatase: 61 IU/L (ref 39–117)
BUN/Creatinine Ratio: 10 (ref 10–22)
CO2: 26 mmol/L (ref 18–29)
Calcium: 8.5 mg/dL — ABNORMAL LOW (ref 8.6–10.2)
Chloride: 97 mmol/L (ref 97–108)
Creatinine, Ser: 0.96 mg/dL (ref 0.76–1.27)
Potassium: 3.7 mmol/L (ref 3.5–5.2)
Sodium: 139 mmol/L (ref 134–144)

## 2013-10-02 LAB — NMR, LIPOPROFILE
HDL Cholesterol by NMR: 37 mg/dL — ABNORMAL LOW (ref >=40)
HDL Particle Number: 28.9 umol/L — ABNORMAL LOW (ref >=30.5)
LDL Particle Number: 796 nmol/L (ref <1000)
LDLC SERPL CALC-MCNC: 54 mg/dL (ref <100)
LP-IR Score: 67 — ABNORMAL HIGH (ref <=45)

## 2013-10-21 ENCOUNTER — Ambulatory Visit (INDEPENDENT_AMBULATORY_CARE_PROVIDER_SITE_OTHER): Payer: Medicare Other | Admitting: Urology

## 2013-10-21 DIAGNOSIS — N529 Male erectile dysfunction, unspecified: Secondary | ICD-10-CM

## 2013-10-21 DIAGNOSIS — N401 Enlarged prostate with lower urinary tract symptoms: Secondary | ICD-10-CM

## 2014-01-02 ENCOUNTER — Ambulatory Visit (INDEPENDENT_AMBULATORY_CARE_PROVIDER_SITE_OTHER): Payer: Medicare HMO | Admitting: Nurse Practitioner

## 2014-01-02 ENCOUNTER — Encounter: Payer: Self-pay | Admitting: Nurse Practitioner

## 2014-01-02 VITALS — BP 135/75 | HR 76 | Temp 97.3°F | Ht 67.0 in | Wt 255.0 lb

## 2014-01-02 DIAGNOSIS — Z23 Encounter for immunization: Secondary | ICD-10-CM

## 2014-01-02 DIAGNOSIS — E785 Hyperlipidemia, unspecified: Secondary | ICD-10-CM

## 2014-01-02 DIAGNOSIS — G4700 Insomnia, unspecified: Secondary | ICD-10-CM

## 2014-01-02 DIAGNOSIS — I10 Essential (primary) hypertension: Secondary | ICD-10-CM

## 2014-01-02 MED ORDER — ZOLPIDEM TARTRATE 10 MG PO TABS: 10.0000 mg | ORAL_TABLET | Freq: Every evening | ORAL | Status: DC | PRN

## 2014-01-02 NOTE — Patient Instructions (Signed)

## 2014-01-02 NOTE — Progress Notes (Signed)
Subjective:    Patient ID: Joseph [name], male    DOB: [birth year], 73 y.o.   MRN: [id number]  Patient in today for follow up- doing well- no complaints today.  Hypertension This is a chronic problem. The current episode started more than 1 year ago. The problem has been resolved since onset. The problem is controlled. Associated symptoms include anxiety and palpitations. Pertinent negatives include no blurred vision, headaches, malaise/fatigue, orthopnea, peripheral edema, shortness of breath or sweats. There are no associated agents to hypertension. Risk factors for coronary artery disease include dyslipidemia, obesity and male gender. Past treatments include ACE inhibitors and diuretics. The current treatment provides moderate improvement. Compliance problems include diet and exercise.  There is no history of a thyroid problem. There is no history of sleep apnea.  Hyperlipidemia This is a chronic problem. The current episode started more than 1 year ago. The problem is uncontrolled. Recent lipid tests were reviewed and are high. Exacerbating diseases include obesity. He has no history of diabetes or hypothyroidism. Pertinent negatives include no shortness of breath. Current antihyperlipidemic treatment includes statins. The current treatment provides moderate improvement of lipids. Compliance problems include adherence to diet and adherence to exercise.  Risk factors for coronary artery disease include hypertension, male sex and obesity.  Insomnia Ambien- works well - can't sleep without it BPH Pt sees Dr. Carmelina Noun and he took pt off proscar-Pt was developing breast, wt gain, and decreased sex drive- Voiding without difficulty   Review of Systems  Constitutional: Negative for malaise/fatigue.  Eyes: Negative for blurred vision.  Respiratory: Negative for shortness of breath.   Cardiovascular: Positive for palpitations. Negative for orthopnea.  Neurological: Negative for headaches.  All other  systems reviewed and are negative.       Objective:   Physical Exam  Constitutional: He is oriented to person, place, and time. He appears well-developed and well-nourished.  HENT:  Head: Normocephalic.  Right Ear: External ear normal.  Left Ear: External ear normal.  Nose: Nose normal.  Mouth/Throat: Oropharynx is clear and moist.  Eyes: EOM are normal. Pupils are equal, round, and reactive to light.  Neck: Normal range of motion. Neck supple. No thyromegaly present.  Cardiovascular: Normal rate, regular rhythm, normal heart sounds and intact distal pulses.   No murmur heard.    Pulmonary/Chest: Effort normal and breath sounds normal. He has no wheezes. He has no rales.  Abdominal: Soft. Bowel sounds are normal.  Musculoskeletal: Normal range of motion.  Neurological: He is alert and oriented to person, place, and time.  Skin: Skin is warm and dry.  Psychiatric: He has a normal mood and affect. His behavior is normal. Judgment and thought content normal.    BP 135/75  Pulse 76  Temp(Src) 97.3 F (36.3 C) (Oral)  Ht _0  (1.702 m)  Wt 255 lb (115.667 kg)  BMI 39.93 kg/m2       Assessment & Plan:   1. Essential hypertension, benign   2. Insomnia   3. Other and unspecified hyperlipidemia    Orders Placed This Encounter  Procedures  . CMP14+EGFR  . NMR, lipoprofile     Meds ordered this encounter  Medications  . zolpidem (AMBIEN) 10 MG tablet    Sig: Take 1 tablet (10 mg total) by mouth at bedtime as needed for sleep.    Dispense:  90 tablet    Refill:  0    Order Specific Question:  Supervising Provider    Answer:  Chipper Herb [  [id number]]    Labs pending Health maintenance reviewed Diet and exercise encouraged Continue all meds Follow up  In 3 months   [place], FNP

## 2014-01-02 NOTE — Addendum Note (Signed)
Addended by: Thana Ates on: 01/02/2014 09:34 AM   Modules accepted: Orders

## 2014-01-03 LAB — CMP14+EGFR
ALK PHOS: 65 IU/L (ref 39–117)
ALT: 14 IU/L (ref 0–44)
AST: 19 IU/L (ref 0–40)
Albumin/Globulin Ratio: 2 (ref 1.1–2.5)
Albumin: 4.1 g/dL (ref 3.5–4.8)
BUN / CREAT RATIO: 12 (ref 10–22)
BUN: 12 mg/dL (ref 8–27)
CHLORIDE: 101 mmol/L (ref 97–108)
CO2: 24 mmol/L (ref 18–29)
CREATININE: 0.98 mg/dL (ref 0.76–1.27)
Calcium: 9.6 mg/dL (ref 8.6–10.2)
GFR calc Af Amer: 89 mL/min/{1.73_m2} (ref >59)
GFR calc non Af Amer: 77 mL/min/{1.73_m2} (ref >59)
GLOBULIN, TOTAL: 2.1 g/dL (ref 1.5–4.5)
Glucose: 89 mg/dL (ref 65–99)
Potassium: 4.4 mmol/L (ref 3.5–5.2)
SODIUM: 141 mmol/L (ref 134–144)
Total Bilirubin: 0.6 mg/dL (ref 0.0–1.2)
Total Protein: 6.2 g/dL (ref 6.0–8.5)

## 2014-01-03 LAB — NMR, LIPOPROFILE
Cholesterol: 129 mg/dL (ref <200)
HDL Cholesterol by NMR: 35 mg/dL — ABNORMAL LOW (ref >=40)
HDL PARTICLE NUMBER: 27.3 umol/L — AB (ref >=30.5)
LDL Particle Number: 1031 nmol/L — ABNORMAL HIGH (ref <1000)
LDL Size: 20.7 nm (ref >20.5)
LDLC SERPL CALC-MCNC: 64 mg/dL (ref <100)
LP-IR SCORE: 65 — AB (ref <=45)
SMALL LDL PARTICLE NUMBER: 487 nmol/L (ref <=527)
Triglycerides by NMR: 150 mg/dL — ABNORMAL HIGH (ref <150)

## 2014-01-04 ENCOUNTER — Telehealth: Payer: Self-pay | Admitting: *Deleted

## 2014-01-04 NOTE — Telephone Encounter (Signed)
LM, all labs look good.

## 2014-01-04 NOTE — Telephone Encounter (Signed)
Message copied by Shelbie Ammons on Wed Jan 04, 2014  9:49 AM ------      Message from: Chevis Pretty      Created: Wed Jan 04, 2014  7:59 AM       All lba slook really goood continue current meds and recheck in 3 months ------

## 2014-03-09 ENCOUNTER — Ambulatory Visit (INDEPENDENT_AMBULATORY_CARE_PROVIDER_SITE_OTHER): Payer: Medicare HMO

## 2014-03-09 ENCOUNTER — Encounter: Payer: Self-pay | Admitting: Nurse Practitioner

## 2014-03-09 ENCOUNTER — Ambulatory Visit (INDEPENDENT_AMBULATORY_CARE_PROVIDER_SITE_OTHER): Payer: Medicare HMO | Admitting: Nurse Practitioner

## 2014-03-09 ENCOUNTER — Encounter (INDEPENDENT_AMBULATORY_CARE_PROVIDER_SITE_OTHER): Payer: Self-pay

## 2014-03-09 VITALS — BP 147/86 | HR 96 | Temp 97.8°F | Ht 67.0 in | Wt 252.2 lb

## 2014-03-09 DIAGNOSIS — M25579 Pain in unspecified ankle and joints of unspecified foot: Secondary | ICD-10-CM

## 2014-03-09 MED ORDER — TRAMADOL HCL 50 MG PO TABS: 50.0000 mg | ORAL_TABLET | Freq: Three times a day (TID) | ORAL | Status: DC | PRN

## 2014-03-09 NOTE — Progress Notes (Signed)
   Subjective:    Patient ID: Joseph [name], male    DOB: [birth year], 73 y.o.   MRN: [id number]  HPI Patient in c/o left foot pain that started 2 weeks ago- hurts to walk on- swelling-worse at night.    Review of Systems  Respiratory: Negative.   Cardiovascular: Negative.   Musculoskeletal: Positive for joint swelling.  All other systems reviewed and are negative.      Objective:   Physical Exam  Constitutional: He is oriented to person, place, and time. He appears well-developed and well-nourished.  Cardiovascular: Normal rate, regular rhythm and normal heart sounds.   Pulmonary/Chest: Effort normal and breath sounds normal.  Musculoskeletal:  Left foot swelling with pain on dorsal surface on palpation.  Neurological: He is alert and oriented to person, place, and time.  Skin: Skin is warm.   BP 147/86  Pulse 96  Temp(Src) 97.8 F (36.6 C) (Oral)  Ht 5\' 7"  (1.702 m)  Wt 252 lb 3.2 oz (114.397 kg)  BMI 39.49 kg/m2 Left foot xray- no fracture visible-Preliminary reading by Ronnald Collum, FNP  [facility]        Assessment & Plan:   1. Pain in joint, ankle and foot   * Possible gout Meds ordered this encounter  Medications  . traMADol (ULTRAM) 50 MG tablet    Sig: Take 1 tablet (50 mg total) by mouth every 8 (eight) hours as needed.    Dispense:  30 tablet    Refill:  0    Order Specific Question:  Supervising Provider    Answer:  Chipper Herb [[id number]]   Orders Placed This Encounter  Procedures  . DG Foot Complete Left    Standing Status: Future     Number of Occurrences: 1     Standing Expiration Date: 05/09/2015    Order Specific Question:  Reason for Exam (SYMPTOM  OR DIAGNOSIS REQUIRED)    Answer:  left lateral foot pain    Order Specific Question:  Preferred imaging location?    Answer:  Internal  . Arthritis Panel  . Vitamin B12   Rest Ice foot Keep elevated Sedation precautions with ultram Labs pending  [place], FNP

## 2014-03-09 NOTE — Patient Instructions (Signed)

## 2014-03-10 LAB — ARTHRITIS PANEL
BASOS ABS: 0.1 10*3/uL (ref 0.0–0.2)
Basos: 1 %
Eos: 3 %
Eosinophils Absolute: 0.2 10*3/uL (ref 0.0–0.4)
HCT: 42.2 % (ref 37.5–51.0)
Hemoglobin: 14.3 g/dL (ref 12.6–17.7)
IMMATURE GRANULOCYTES: 0 %
Immature Grans (Abs): 0 10*3/uL (ref 0.0–0.1)
Lymphocytes Absolute: 1.2 10*3/uL (ref 0.7–3.1)
Lymphs: 18 %
MCH: 31.5 pg (ref 26.6–33.0)
MCHC: 33.9 g/dL (ref 31.5–35.7)
MCV: 93 fL (ref 79–97)
Monocytes Absolute: 0.6 10*3/uL (ref 0.1–0.9)
Monocytes: 8 %
Neutrophils Absolute: 4.9 10*3/uL (ref 1.4–7.0)
Neutrophils Relative %: 70 %
PLATELETS: 212 10*3/uL (ref 150–379)
RBC: 4.54 x10E6/uL (ref 4.14–5.80)
RDW: 13.6 % (ref 12.3–15.4)
Rhuematoid fact SerPl-aCnc: 8.2 IU/mL (ref 0.0–13.9)
Uric Acid: 10.1 mg/dL — ABNORMAL HIGH (ref 3.7–8.6)
WBC: 7.1 10*3/uL (ref 3.4–10.8)

## 2014-03-10 LAB — VITAMIN B12: Vitamin B-12: 731 pg/mL (ref 211–946)

## 2014-03-13 ENCOUNTER — Other Ambulatory Visit: Payer: Self-pay | Admitting: Nurse Practitioner

## 2014-03-13 MED ORDER — ALLOPURINOL 300 MG PO TABS: 300.0000 mg | ORAL_TABLET | Freq: Every day | ORAL | Status: DC

## 2014-04-04 ENCOUNTER — Encounter: Payer: Self-pay | Admitting: Nurse Practitioner

## 2014-04-04 ENCOUNTER — Ambulatory Visit (INDEPENDENT_AMBULATORY_CARE_PROVIDER_SITE_OTHER): Payer: Medicare HMO | Admitting: Nurse Practitioner

## 2014-04-04 VITALS — BP 127/76 | HR 78 | Temp 97.4°F | Ht 67.0 in | Wt 248.6 lb

## 2014-04-04 DIAGNOSIS — Z6838 Body mass index (BMI) 38.0-38.9, adult: Secondary | ICD-10-CM

## 2014-04-04 DIAGNOSIS — M109 Gout, unspecified: Secondary | ICD-10-CM

## 2014-04-04 DIAGNOSIS — G4700 Insomnia, unspecified: Secondary | ICD-10-CM

## 2014-04-04 DIAGNOSIS — Z713 Dietary counseling and surveillance: Secondary | ICD-10-CM

## 2014-04-04 DIAGNOSIS — I10 Essential (primary) hypertension: Secondary | ICD-10-CM

## 2014-04-04 DIAGNOSIS — Z125 Encounter for screening for malignant neoplasm of prostate: Secondary | ICD-10-CM

## 2014-04-04 DIAGNOSIS — E785 Hyperlipidemia, unspecified: Secondary | ICD-10-CM

## 2014-04-04 MED ORDER — ZOLPIDEM TARTRATE 10 MG PO TABS: 10.0000 mg | ORAL_TABLET | Freq: Every evening | ORAL | Status: DC | PRN

## 2014-04-04 MED ORDER — TRAMADOL HCL 50 MG PO TABS: 50.0000 mg | ORAL_TABLET | Freq: Three times a day (TID) | ORAL | Status: DC | PRN

## 2014-04-04 NOTE — Patient Instructions (Signed)

## 2014-04-04 NOTE — Progress Notes (Signed)
Subjective:    Patient ID: Joseph [name], male    DOB: 1941/07/30, 73 y.o.   MRN: [id number]  Patient here today for follow up of chronic medical problems.  Hypertension This is a chronic problem. The current episode started more than 1 year ago. The problem has been resolved since onset. The problem is controlled. Associated symptoms include anxiety and palpitations. Pertinent negatives include no blurred vision, headaches, malaise/fatigue, orthopnea, peripheral edema, shortness of breath or sweats. There are no associated agents to hypertension. Risk factors for coronary artery disease include dyslipidemia, obesity and male gender. Past treatments include ACE inhibitors and diuretics. The current treatment provides moderate improvement. Compliance problems include diet and exercise.  There is no history of a thyroid problem. There is no history of sleep apnea.  Hyperlipidemia This is a chronic problem. The current episode started more than 1 year ago. The problem is uncontrolled. Recent lipid tests were reviewed and are high. Exacerbating diseases include obesity. He has no history of diabetes or hypothyroidism. Pertinent negatives include no shortness of breath. Current antihyperlipidemic treatment includes statins. The current treatment provides moderate improvement of lipids. Compliance problems include adherence to diet and adherence to exercise.  Risk factors for coronary artery disease include hypertension, male sex and obesity.  Insomnia Ambien- works well - can't sleep without it BPH Pt sees Dr. Carmelina Noun and he took pt off proscar-Pt was developing breast, wt gain, and decreased sex drive- Voiding without difficulty   Review of Systems  Constitutional: Negative for malaise/fatigue.  Eyes: Negative for blurred vision.  Respiratory: Negative for shortness of breath.   Cardiovascular: Positive for palpitations. Negative for orthopnea.  Neurological: Negative for headaches.  All other systems  reviewed and are negative.      Objective:   Physical Exam  Constitutional: He is oriented to person, place, and time. He appears well-developed and well-nourished.  HENT:  Head: Normocephalic.  Right Ear: External ear normal.  Left Ear: External ear normal.  Nose: Nose normal.  Mouth/Throat: Oropharynx is clear and moist.  Eyes: EOM are normal. Pupils are equal, round, and reactive to light.  Neck: Normal range of motion. Neck supple. No thyromegaly present.  Cardiovascular: Normal rate, regular rhythm, normal heart sounds and intact distal pulses.   No murmur heard.    Pulmonary/Chest: Effort normal and breath sounds normal. He has no wheezes. He has no rales.  Abdominal: Soft. Bowel sounds are normal.  Musculoskeletal: Normal range of motion.  Neurological: He is alert and oriented to person, place, and time.  Skin: Skin is warm and dry.  Psychiatric: He has a normal mood and affect. His behavior is normal. Judgment and thought content normal.    BP 127/76  Pulse 78  Temp(Src) 97.4 F (36.3 C) (Oral)  Ht '5\' 7"'  (1.702 m)  Wt 248 lb 9.6 oz (112.764 kg)  BMI 38.93 kg/m2       Assessment & Plan:   1. Essential hypertension, benign   2. Insomnia   3. Hyperlipidemia LDL goal < 100   4. Gout   5. BMI 38.0-38.9,adult   6. Weight loss counseling, encounter for    Orders Placed This Encounter  Procedures  . CMP14+EGFR  . NMR, lipoprofile   Meds ordered this encounter  Medications  . zolpidem (AMBIEN) 10 MG tablet    Sig: Take 1 tablet (10 mg total) by mouth at bedtime as needed for sleep.    Dispense:  90 tablet    Refill:  0  Order Specific Question:  Supervising Provider    Answer:  Chipper Herb [[id number]]  . traMADol (ULTRAM) 50 MG tablet    Sig: Take 1 tablet (50 mg total) by mouth every 8 (eight) hours as needed.    Dispense:  30 tablet    Refill:  0    Order Specific Question:  Supervising Provider    Answer:  Chipper Herb [[id number]]   Refuses  colonoscopy  Labs pending Health maintenance reviewed Diet and exercise encouraged Continue all meds Follow up  In 3 months   [place], FNP

## 2014-04-05 LAB — CMP14+EGFR
ALBUMIN: 4 g/dL (ref 3.5–4.8)
ALK PHOS: 72 IU/L (ref 39–117)
ALT: 12 IU/L (ref 0–44)
AST: 19 IU/L (ref 0–40)
Albumin/Globulin Ratio: 1.6 (ref 1.1–2.5)
BUN/Creatinine Ratio: 14 (ref 10–22)
BUN: 13 mg/dL (ref 8–27)
CALCIUM: 9.6 mg/dL (ref 8.6–10.2)
CO2: 24 mmol/L (ref 18–29)
CREATININE: 0.94 mg/dL (ref 0.76–1.27)
Chloride: 98 mmol/L (ref 97–108)
GFR calc Af Amer: 94 mL/min/{1.73_m2} (ref >59)
GFR calc non Af Amer: 81 mL/min/{1.73_m2} (ref >59)
GLOBULIN, TOTAL: 2.5 g/dL (ref 1.5–4.5)
Glucose: 95 mg/dL (ref 65–99)
Potassium: 4.2 mmol/L (ref 3.5–5.2)
SODIUM: 138 mmol/L (ref 134–144)
Total Bilirubin: 0.6 mg/dL (ref 0.0–1.2)
Total Protein: 6.5 g/dL (ref 6.0–8.5)

## 2014-04-05 LAB — NMR, LIPOPROFILE
CHOLESTEROL: 114 mg/dL (ref <200)
HDL CHOLESTEROL BY NMR: 35 mg/dL — AB (ref >=40)
HDL PARTICLE NUMBER: 26.6 umol/L — AB (ref >=30.5)
LDL Particle Number: 842 nmol/L (ref <1000)
LDL Size: 20.9 nm (ref >20.5)
LDLC SERPL CALC-MCNC: 64 mg/dL (ref <100)
LP-IR Score: 57 — ABNORMAL HIGH (ref <=45)
SMALL LDL PARTICLE NUMBER: 366 nmol/L (ref <=527)
Triglycerides by NMR: 77 mg/dL (ref <150)

## 2014-04-05 LAB — PSA, TOTAL AND FREE
PSA FREE: 0.78 ng/mL
PSA, Free Pct: 15.9 %
PSA: 4.9 ng/mL — ABNORMAL HIGH (ref 0.0–4.0)

## 2014-04-06 ENCOUNTER — Telehealth: Payer: Self-pay | Admitting: Nurse Practitioner

## 2014-04-07 NOTE — Telephone Encounter (Signed)
Patient aware of results and labs mailed to patient

## 2014-04-21 ENCOUNTER — Ambulatory Visit (INDEPENDENT_AMBULATORY_CARE_PROVIDER_SITE_OTHER): Payer: Commercial Managed Care - HMO | Admitting: Urology

## 2014-04-21 DIAGNOSIS — N401 Enlarged prostate with lower urinary tract symptoms: Secondary | ICD-10-CM

## 2014-04-21 DIAGNOSIS — R972 Elevated prostate specific antigen [PSA]: Secondary | ICD-10-CM

## 2014-04-21 DIAGNOSIS — N138 Other obstructive and reflux uropathy: Secondary | ICD-10-CM | POA: Diagnosis not present

## 2014-04-21 DIAGNOSIS — R351 Nocturia: Secondary | ICD-10-CM | POA: Diagnosis not present

## 2014-05-10 ENCOUNTER — Other Ambulatory Visit: Payer: Self-pay | Admitting: Nurse Practitioner

## 2014-05-11 NOTE — Telephone Encounter (Signed)
Last seen 04/04/14  MMM  IF approved print and route to nurse

## 2014-05-12 NOTE — Telephone Encounter (Signed)
Ca pick rx up Monday when i return to sign

## 2014-05-12 NOTE — Telephone Encounter (Signed)
Will have to wait until I return for refill

## 2014-05-12 NOTE — Telephone Encounter (Signed)
Patient aware.

## 2014-05-25 ENCOUNTER — Telehealth: Payer: Self-pay | Admitting: Nurse Practitioner

## 2014-05-25 MED ORDER — TRAMADOL HCL 50 MG PO TABS: ORAL_TABLET | ORAL | Status: DC

## 2014-05-25 NOTE — Telephone Encounter (Signed)
Patient aware.

## 2014-05-25 NOTE — Telephone Encounter (Signed)
rx ready for pickup 

## 2014-06-13 ENCOUNTER — Other Ambulatory Visit (INDEPENDENT_AMBULATORY_CARE_PROVIDER_SITE_OTHER): Payer: Medicare HMO

## 2014-06-13 DIAGNOSIS — Z125 Encounter for screening for malignant neoplasm of prostate: Secondary | ICD-10-CM

## 2014-06-14 LAB — PSA, TOTAL AND FREE
PSA, Free Pct: 20.3 %
PSA, Free: 0.59 ng/mL
PSA: 2.9 ng/mL (ref 0.0–4.0)

## 2014-06-16 ENCOUNTER — Ambulatory Visit (INDEPENDENT_AMBULATORY_CARE_PROVIDER_SITE_OTHER): Payer: Commercial Managed Care - HMO | Admitting: Urology

## 2014-06-16 DIAGNOSIS — Z87442 Personal history of urinary calculi: Secondary | ICD-10-CM

## 2014-06-16 DIAGNOSIS — N401 Enlarged prostate with lower urinary tract symptoms: Secondary | ICD-10-CM

## 2014-06-16 DIAGNOSIS — R972 Elevated prostate specific antigen [PSA]: Secondary | ICD-10-CM

## 2014-07-06 ENCOUNTER — Telehealth: Payer: Self-pay | Admitting: Nurse Practitioner

## 2014-07-06 DIAGNOSIS — G4700 Insomnia, unspecified: Secondary | ICD-10-CM

## 2014-07-06 MED ORDER — ZOLPIDEM TARTRATE 10 MG PO TABS: 10.0000 mg | ORAL_TABLET | Freq: Every evening | ORAL | Status: DC | PRN

## 2014-07-06 MED ORDER — TRAMADOL HCL 50 MG PO TABS: ORAL_TABLET | ORAL | Status: DC

## 2014-07-06 NOTE — Telephone Encounter (Signed)
Patient came to pick up.

## 2014-07-06 NOTE — Telephone Encounter (Signed)
rx ready for pickup 

## 2014-07-10 ENCOUNTER — Ambulatory Visit (INDEPENDENT_AMBULATORY_CARE_PROVIDER_SITE_OTHER): Payer: Medicare HMO

## 2014-07-10 ENCOUNTER — Encounter: Payer: Self-pay | Admitting: Nurse Practitioner

## 2014-07-10 ENCOUNTER — Ambulatory Visit (INDEPENDENT_AMBULATORY_CARE_PROVIDER_SITE_OTHER): Payer: Medicare HMO | Admitting: Nurse Practitioner

## 2014-07-10 VITALS — BP 124/80 | HR 76 | Temp 97.3°F | Ht 67.0 in | Wt 249.2 lb

## 2014-07-10 DIAGNOSIS — Z87891 Personal history of nicotine dependence: Secondary | ICD-10-CM

## 2014-07-10 DIAGNOSIS — Z713 Dietary counseling and surveillance: Secondary | ICD-10-CM

## 2014-07-10 DIAGNOSIS — M1A09X0 Idiopathic chronic gout, multiple sites, without tophus (tophi): Secondary | ICD-10-CM

## 2014-07-10 DIAGNOSIS — N400 Enlarged prostate without lower urinary tract symptoms: Secondary | ICD-10-CM | POA: Insufficient documentation

## 2014-07-10 DIAGNOSIS — I10 Essential (primary) hypertension: Secondary | ICD-10-CM

## 2014-07-10 DIAGNOSIS — G4700 Insomnia, unspecified: Secondary | ICD-10-CM

## 2014-07-10 DIAGNOSIS — M1A00X0 Idiopathic chronic gout, unspecified site, without tophus (tophi): Secondary | ICD-10-CM

## 2014-07-10 DIAGNOSIS — Z6839 Body mass index (BMI) 39.0-39.9, adult: Secondary | ICD-10-CM

## 2014-07-10 DIAGNOSIS — E785 Hyperlipidemia, unspecified: Secondary | ICD-10-CM

## 2014-07-10 MED ORDER — TRAMADOL HCL 50 MG PO TABS: ORAL_TABLET | ORAL | Status: DC

## 2014-07-10 MED ORDER — ALLOPURINOL 300 MG PO TABS: 300.0000 mg | ORAL_TABLET | Freq: Every day | ORAL | Status: DC

## 2014-07-10 MED ORDER — BENAZEPRIL HCL 20 MG PO TABS: 20.0000 mg | ORAL_TABLET | Freq: Every day | ORAL | Status: DC

## 2014-07-10 MED ORDER — HYDROCHLOROTHIAZIDE 25 MG PO TABS: 25.0000 mg | ORAL_TABLET | Freq: Every day | ORAL | Status: DC

## 2014-07-10 MED ORDER — ATORVASTATIN CALCIUM 40 MG PO TABS: 40.0000 mg | ORAL_TABLET | Freq: Every day | ORAL | Status: DC

## 2014-07-10 NOTE — Progress Notes (Addendum)
Subjective:    Patient ID: Joseph [name], male    DOB: 1941-06-05, 73 y.o.   MRN: [id number]  Patient here today for follow up of chronic medical problems. NO complaints  today  Hypertension This is a chronic problem. The current episode started more than 1 year ago. The problem has been resolved since onset. The problem is controlled. Associated symptoms include anxiety and palpitations. Pertinent negatives include no blurred vision, headaches, malaise/fatigue, orthopnea, peripheral edema, shortness of breath or sweats. There are no associated agents to hypertension. Risk factors for coronary artery disease include dyslipidemia, obesity and male gender. Past treatments include ACE inhibitors and diuretics. The current treatment provides moderate improvement. Compliance problems include diet and exercise.  There is no history of a thyroid problem. There is no history of sleep apnea.  Hyperlipidemia This is a chronic problem. The current episode started more than 1 year ago. The problem is uncontrolled. Recent lipid tests were reviewed and are high. Exacerbating diseases include obesity. He has no history of diabetes or hypothyroidism. Pertinent negatives include no shortness of breath. Current antihyperlipidemic treatment includes statins. The current treatment provides moderate improvement of lipids. Compliance problems include adherence to diet and adherence to exercise.  Risk factors for coronary artery disease include hypertension, male sex and obesity.  Insomnia Ambien- works well - can't sleep without it BPH Pt sees Dr. Carmelina Noun and he took pt off proscar-Pt was developing breast, wt gain, and decreased sex drive- Voiding without difficulty GOUT  Usually in his feet- had a flare up about 2 months ago- only last couple of days  Review of Systems  Constitutional: Negative for malaise/fatigue.  Eyes: Negative for blurred vision.  Respiratory: Negative for shortness of breath.   Cardiovascular:  Positive for palpitations. Negative for orthopnea.  Neurological: Negative for headaches.  All other systems reviewed and are negative.      Objective:   Physical Exam  Constitutional: He is oriented to person, place, and time. He appears well-developed and well-nourished.  HENT:  Head: Normocephalic.  Right Ear: External ear normal.  Left Ear: External ear normal.  Nose: Nose normal.  Mouth/Throat: Oropharynx is clear and moist.  Eyes: EOM are normal. Pupils are equal, round, and reactive to light.  Neck: Normal range of motion. Neck supple. No thyromegaly present.  Cardiovascular: Normal rate, regular rhythm, normal heart sounds and intact distal pulses.   No murmur heard.    Pulmonary/Chest: Effort normal and breath sounds normal. He has no wheezes. He has no rales.  Abdominal: Soft. Bowel sounds are normal.  Musculoskeletal: Normal range of motion.  Neurological: He is alert and oriented to person, place, and time.  Skin: Skin is warm and dry.  Psychiatric: He has a normal mood and affect. His behavior is normal. Judgment and thought content normal.    BP 124/80  Pulse 76  Temp(Src) 97.3 F (36.3 C) (Oral)  Ht 5' 7" (1.702 m)  Wt 249 lb 3.2 oz (113.036 kg)  BMI 39.02 kg/m2  EKG- NSR-Mary-Margaret Hassell Done, FNP  Chest x ray- normal- cardiomegaly-Preliminary reading by Ronnald Collum, FNP  [facility]      Assessment & Plan:    1. Insomnia   2. Hyperlipidemia with target LDL less than 100   3. Idiopathic chronic gout of multiple sites without tophus   4. Essential hypertension, benign   5. BMI 39.0-39.9,adult   6. Weight loss counseling, encounter for   7. BPH (benign prostatic hyperplasia)   8. Hx of tobacco use,  presenting hazards to health   9. Other and unspecified hyperlipidemia    Orders Placed This Encounter  Procedures  . DG Chest 2 View    Standing Status: Future     Number of Occurrences:      Standing Expiration Date: 09/09/2015    Order Specific Question:   Reason for Exam (SYMPTOM  OR DIAGNOSIS REQUIRED)    Answer:  tobacco hx    Order Specific Question:  Preferred imaging location?    Answer:  Internal  . CMP14+EGFR  . NMR, lipoprofile  . EKG 12-Lead   Meds ordered this encounter  Medications  . hydrochlorothiazide (HYDRODIURIL) 25 MG tablet    Sig: Take 1 tablet (25 mg total) by mouth daily.    Dispense:  90 tablet    Refill:  3    Order Specific Question:  Supervising Provider    Answer:  MOORE, DONALD W [[id number]]  . benazepril (LOTENSIN) 20 MG tablet    Sig: Take 1 tablet (20 mg total) by mouth daily.    Dispense:  90 tablet    Refill:  3    Order Specific Question:  Supervising Provider    Answer:  MOORE, DONALD W [[id number]]  . atorvastatin (LIPITOR) 40 MG tablet    Sig: Take 1 tablet (40 mg total) by mouth daily.    Dispense:  90 tablet    Refill:  3    Order Specific Question:  Supervising Provider    Answer:  MOORE, DONALD W [[id number]]  . allopurinol (ZYLOPRIM) 300 MG tablet    Sig: Take 1 tablet (300 mg total) by mouth daily.    Dispense:  90 tablet    Refill:  1    Order Specific Question:  Supervising Provider    Answer:  MOORE, DONALD W [[id number]]  . traMADol (ULTRAM) 50 MG tablet    Sig: TAKE ONE TABLET BY MOUTH EVERY 8 HOURS AS NEEDED    Dispense:  30 tablet    Refill:  1    DO NOT FILL TILL 08/08/14    Order Specific Question:  Supervising Provider    Answer:  MOORE, DONALD W [[id number]]  hemoccult cards given to patient with directions Refuses colonoscopy, zostavax and tetanus- told to come in for tetanus if gets cut pr dog bite Labs pending Health maintenance reviewed Diet and exercise encouraged Continue all meds Follow up  In 3 months   Mary-Margaret Martin, FNP    

## 2014-07-10 NOTE — Patient Instructions (Signed)
Exercise to Lose Weight Exercise and a healthy diet may help you lose weight. Your doctor may suggest specific exercises. EXERCISE IDEAS AND TIPS  Choose low-cost things you enjoy doing, such as walking, bicycling, or exercising to workout videos.  Take stairs instead of the elevator.  Walk during your lunch break.  Park your car further away from work or school.  Go to a gym or an exercise class.  Start with 5 to 10 minutes of exercise each day. Build up to 30 minutes of exercise 4 to 6 days a week.  Wear shoes with good support and comfortable clothes.  Stretch before and after working out.  Work out until you breathe harder and your heart beats faster.  Drink extra water when you exercise.  Do not do so much that you hurt yourself, feel dizzy, or get very short of breath. Exercises that burn about 150 calories:  Running 1  miles in 15 minutes.  Playing volleyball for 45 to 60 minutes.  Washing and waxing a car for 45 to 60 minutes.  Playing touch football for 45 minutes.  Walking 1  miles in 35 minutes.  Pushing a stroller 1  miles in 30 minutes.  Playing basketball for 30 minutes.  Raking leaves for 30 minutes.  Bicycling 5 miles in 30 minutes.  Walking 2 miles in 30 minutes.  Dancing for 30 minutes.  Shoveling snow for 15 minutes.  Swimming laps for 20 minutes.  Walking up stairs for 15 minutes.  Bicycling 4 miles in 15 minutes.  Gardening for 30 to 45 minutes.  Jumping rope for 15 minutes.  Washing windows or floors for 45 to 60 minutes. Document Released: 12/20/2010 Document Revised: 02/09/2012 Document Reviewed: 12/20/2010 [employer] Patient Information 2015 [employer]. This information is not intended to replace advice given to you by your health care provider. Make sure you discuss any questions you have with your health care provider.  

## 2014-07-11 LAB — CMP14+EGFR
A/G RATIO: 1.7 (ref 1.1–2.5)
ALT: 15 IU/L (ref 0–44)
AST: 16 IU/L (ref 0–40)
Albumin: 3.9 g/dL (ref 3.5–4.8)
Alkaline Phosphatase: 66 IU/L (ref 39–117)
BUN/Creatinine Ratio: 14 (ref 10–22)
BUN: 12 mg/dL (ref 8–27)
CALCIUM: 9.7 mg/dL (ref 8.6–10.2)
CO2: 24 mmol/L (ref 18–29)
Chloride: 101 mmol/L (ref 97–108)
Creatinine, Ser: 0.88 mg/dL (ref 0.76–1.27)
GFR calc Af Amer: 100 mL/min/{1.73_m2} (ref >59)
GFR, EST NON AFRICAN AMERICAN: 86 mL/min/{1.73_m2} (ref >59)
GLOBULIN, TOTAL: 2.3 g/dL (ref 1.5–4.5)
Glucose: 97 mg/dL (ref 65–99)
Potassium: 4.1 mmol/L (ref 3.5–5.2)
SODIUM: 140 mmol/L (ref 134–144)
Total Bilirubin: 0.4 mg/dL (ref 0.0–1.2)
Total Protein: 6.2 g/dL (ref 6.0–8.5)

## 2014-07-11 LAB — NMR, LIPOPROFILE
CHOLESTEROL: 123 mg/dL (ref 100–199)
HDL CHOLESTEROL BY NMR: 37 mg/dL — AB (ref >39)
HDL PARTICLE NUMBER: 29 umol/L — AB (ref >=30.5)
LDL Particle Number: 797 nmol/L (ref <1000)
LDL Size: 20.4 nm (ref >20.5)
LDLC SERPL CALC-MCNC: 65 mg/dL (ref 0–99)
LP-IR Score: 76 — ABNORMAL HIGH (ref <=45)
SMALL LDL PARTICLE NUMBER: 399 nmol/L (ref <=527)
Triglycerides by NMR: 104 mg/dL (ref 0–149)

## 2014-07-13 ENCOUNTER — Other Ambulatory Visit: Payer: Medicare HMO

## 2014-07-13 DIAGNOSIS — Z1212 Encounter for screening for malignant neoplasm of rectum: Secondary | ICD-10-CM

## 2014-07-14 LAB — FECAL OCCULT BLOOD, IMMUNOCHEMICAL: Fecal Occult Bld: NEGATIVE

## 2014-10-05 ENCOUNTER — Encounter: Payer: Self-pay | Admitting: Nurse Practitioner

## 2014-10-05 ENCOUNTER — Ambulatory Visit (INDEPENDENT_AMBULATORY_CARE_PROVIDER_SITE_OTHER): Payer: Medicare HMO | Admitting: Nurse Practitioner

## 2014-10-05 VITALS — BP 131/78 | HR 82 | Temp 97.6°F | Ht 67.0 in | Wt 251.4 lb

## 2014-10-05 DIAGNOSIS — Z23 Encounter for immunization: Secondary | ICD-10-CM

## 2014-10-05 DIAGNOSIS — M1A09X0 Idiopathic chronic gout, multiple sites, without tophus (tophi): Secondary | ICD-10-CM

## 2014-10-05 DIAGNOSIS — E785 Hyperlipidemia, unspecified: Secondary | ICD-10-CM

## 2014-10-05 DIAGNOSIS — I10 Essential (primary) hypertension: Secondary | ICD-10-CM

## 2014-10-05 DIAGNOSIS — G4700 Insomnia, unspecified: Secondary | ICD-10-CM

## 2014-10-05 DIAGNOSIS — N400 Enlarged prostate without lower urinary tract symptoms: Secondary | ICD-10-CM

## 2014-10-05 MED ORDER — BENAZEPRIL HCL 20 MG PO TABS: 20.0000 mg | ORAL_TABLET | Freq: Every day | ORAL | Status: DC

## 2014-10-05 MED ORDER — ALLOPURINOL 300 MG PO TABS: 300.0000 mg | ORAL_TABLET | Freq: Every day | ORAL | Status: DC

## 2014-10-05 MED ORDER — HYDROCHLOROTHIAZIDE 25 MG PO TABS
25.0000 mg | ORAL_TABLET | Freq: Every day | ORAL | Status: DC
Start: 2014-10-05 — End: 2015-10-30

## 2014-10-05 MED ORDER — ATORVASTATIN CALCIUM 40 MG PO TABS: 40.0000 mg | ORAL_TABLET | Freq: Every day | ORAL | Status: DC

## 2014-10-05 MED ORDER — ZOLPIDEM TARTRATE 10 MG PO TABS: 10.0000 mg | ORAL_TABLET | Freq: Every evening | ORAL | Status: DC | PRN

## 2014-10-05 MED ORDER — TRAMADOL HCL 50 MG PO TABS: ORAL_TABLET | ORAL | Status: DC

## 2014-10-05 NOTE — Patient Instructions (Addendum)

## 2014-10-05 NOTE — Addendum Note (Signed)
Addended by: Jamelle Haring on: 10/05/2014 11:35 AM   Modules accepted: Orders

## 2014-10-05 NOTE — Progress Notes (Signed)
Subjective:    Patient ID: Joseph [name], male    DOB: 1941-11-21, 73 y.o.   MRN: [id number]  Patient here today for follow up of chronic medical problems. NO complaints  today  Hypertension This is a chronic problem. The current episode started more than 1 year ago. The problem is unchanged. The problem is controlled. Associated symptoms include palpitations. Pertinent negatives include no headaches or shortness of breath. Risk factors for coronary artery disease include dyslipidemia and male gender. Past treatments include diuretics and ACE inhibitors. The current treatment provides significant improvement. Compliance problems include diet and exercise.   Hyperlipidemia This is a chronic problem. The current episode started more than 1 year ago. The problem is controlled. Recent lipid tests were reviewed and are normal. He has no history of diabetes, hypothyroidism or obesity. There are no known factors aggravating his hyperlipidemia. Pertinent negatives include no shortness of breath. Current antihyperlipidemic treatment includes statins. The current treatment provides moderate improvement of lipids. Compliance problems include adherence to diet and adherence to exercise.  Risk factors for coronary artery disease include dyslipidemia, hypertension and obesity.  Insomnia Ambien- works well - can't sleep without it BPH Pt sees Dr. Donnetta Hail and he took pt off proscar-Pt was developing breast, wt gain, and decreased sex drive- Voiding without difficulty GOUT  Usually in his feet- had a flare up about 2 months ago- only last couple of days  Review of Systems  Respiratory: Negative for shortness of breath.   Cardiovascular: Positive for palpitations.  Neurological: Negative for headaches.  All other systems reviewed and are negative.      Objective:   Physical Exam  Constitutional: He is oriented to person, place, and time. He appears well-developed and well-nourished.  HENT:  Head:  Normocephalic.  Right Ear: External ear normal.  Left Ear: External ear normal.  Nose: Nose normal.  Mouth/Throat: Oropharynx is clear and moist.  Eyes: EOM are normal. Pupils are equal, round, and reactive to light.  Neck: Normal range of motion. Neck supple. No JVD present. No thyromegaly present.  Cardiovascular: Normal rate, regular rhythm, normal heart sounds and intact distal pulses.  Exam reveals no gallop and no friction rub.   No murmur heard. Pulmonary/Chest: Effort normal and breath sounds normal. No respiratory distress. He has no wheezes. He has no rales. He exhibits no tenderness.  Abdominal: Soft. Bowel sounds are normal. He exhibits no mass. There is no tenderness.  Genitourinary:  Refuses rectal exam  Musculoskeletal: Normal range of motion. He exhibits no edema.  Lymphadenopathy:    He has no cervical adenopathy.  Neurological: He is alert and oriented to person, place, and time. No cranial nerve deficit.  Skin: Skin is warm and dry.  Psychiatric: He has a normal mood and affect. His behavior is normal. Judgment and thought content normal.    BP 131/78 mmHg  Pulse 82  Temp(Src) 97.6 F (36.4 C) (Oral)  Ht 5\' 7"  (1.702 m)  Wt 251 lb 6.4 oz (114.034 kg)  BMI 39.37 kg/m2        Assessment & Plan:  1. Essential hypertension, benign Low na+ diet - CMP14+EGFR - hydrochlorothiazide (HYDRODIURIL) 25 MG tablet; Take 1 tablet (25 mg total) by mouth daily.  Dispense: 90 tablet; Refill: 3 - benazepril (LOTENSIN) 20 MG tablet; Take 1 tablet (20 mg total) by mouth daily.  Dispense: 90 tablet; Refill: 3  2. BPH (benign prostatic hyperplasia) - PSA, total and free  3. Hyperlipidemia with target LDL less than  100 Low fat diet - NMR, lipoprofile - atorvastatin (LIPITOR) 40 MG tablet; Take 1 tablet (40 mg total) by mouth daily.  Dispense: 90 tablet; Refill: 3  4. Insomnia Bedtime ritual - zolpidem (AMBIEN) 10 MG tablet; Take 1 tablet (10 mg total) by mouth at bedtime  as needed for sleep.  Dispense: 90 tablet; Refill: 0  5. Idiopathic chronic gout of multiple sites without tophus - allopurinol (ZYLOPRIM) 300 MG tablet; Take 1 tablet (300 mg total) by mouth daily.  Dispense: 90 tablet; Refill: 1 - traMADol (ULTRAM) 50 MG tablet; TAKE ONE TABLET BY MOUTH EVERY 8 HOURS AS NEEDED  Dispense: 30 tablet; Refill: 1  6. BMI >40 Discussed diet and exercise for person with BMI >25 Will recheck weight in 3-6 months   Labs pending Health maintenance reviewed Diet and exercise encouraged Continue all meds Follow up  In 3 months   [place], FNP

## 2014-10-06 LAB — CMP14+EGFR
ALT: 17 IU/L (ref 0–44)
AST: 20 IU/L (ref 0–40)
Albumin/Globulin Ratio: 1.6 (ref 1.1–2.5)
Albumin: 4.1 g/dL (ref 3.5–4.8)
Alkaline Phosphatase: 71 IU/L (ref 39–117)
BUN/Creatinine Ratio: 11 (ref 10–22)
BUN: 11 mg/dL (ref 8–27)
CALCIUM: 9.5 mg/dL (ref 8.6–10.2)
CHLORIDE: 97 mmol/L (ref 97–108)
CO2: 23 mmol/L (ref 18–29)
Creatinine, Ser: 1.02 mg/dL (ref 0.76–1.27)
GFR calc Af Amer: 85 mL/min/{1.73_m2} (ref >59)
GFR calc non Af Amer: 74 mL/min/{1.73_m2} (ref >59)
GLUCOSE: 83 mg/dL (ref 65–99)
Globulin, Total: 2.5 g/dL (ref 1.5–4.5)
POTASSIUM: 4 mmol/L (ref 3.5–5.2)
SODIUM: 140 mmol/L (ref 134–144)
TOTAL PROTEIN: 6.6 g/dL (ref 6.0–8.5)
Total Bilirubin: 0.8 mg/dL (ref 0.0–1.2)

## 2014-10-06 LAB — NMR, LIPOPROFILE
Cholesterol: 135 mg/dL (ref 100–199)
HDL CHOLESTEROL BY NMR: 39 mg/dL — AB (ref >39)
HDL Particle Number: 28.4 umol/L — ABNORMAL LOW (ref >=30.5)
LDL PARTICLE NUMBER: 1022 nmol/L — AB (ref <1000)
LDL Size: 20.5 nm (ref >20.5)
LDL-C: 70 mg/dL (ref 0–99)
LP-IR Score: 75 — ABNORMAL HIGH (ref <=45)
Small LDL Particle Number: 413 nmol/L (ref <=527)
TRIGLYCERIDES BY NMR: 130 mg/dL (ref 0–149)

## 2014-10-06 LAB — PSA, TOTAL AND FREE
PSA FREE PCT: 25 %
PSA, Free: 0.85 ng/mL
PSA: 3.4 ng/mL (ref 0.0–4.0)

## 2014-12-28 ENCOUNTER — Other Ambulatory Visit: Payer: Self-pay | Admitting: Nurse Practitioner

## 2014-12-28 NOTE — Telephone Encounter (Signed)
rx ready for pickup 

## 2014-12-28 NOTE — Telephone Encounter (Signed)
Last seen 10/05/14 MMM If approved print and route to nurse

## 2015-01-08 ENCOUNTER — Ambulatory Visit (INDEPENDENT_AMBULATORY_CARE_PROVIDER_SITE_OTHER): Payer: Medicare HMO | Admitting: Nurse Practitioner

## 2015-01-08 ENCOUNTER — Encounter: Payer: Self-pay | Admitting: Nurse Practitioner

## 2015-01-08 VITALS — BP 126/79 | HR 79 | Temp 96.7°F | Ht 67.0 in | Wt 253.0 lb

## 2015-01-08 DIAGNOSIS — E785 Hyperlipidemia, unspecified: Secondary | ICD-10-CM | POA: Diagnosis not present

## 2015-01-08 DIAGNOSIS — N400 Enlarged prostate without lower urinary tract symptoms: Secondary | ICD-10-CM | POA: Diagnosis not present

## 2015-01-08 DIAGNOSIS — G8929 Other chronic pain: Secondary | ICD-10-CM

## 2015-01-08 DIAGNOSIS — M1A09X0 Idiopathic chronic gout, multiple sites, without tophus (tophi): Secondary | ICD-10-CM | POA: Diagnosis not present

## 2015-01-08 DIAGNOSIS — M79606 Pain in leg, unspecified: Secondary | ICD-10-CM

## 2015-01-08 DIAGNOSIS — G4700 Insomnia, unspecified: Secondary | ICD-10-CM

## 2015-01-08 DIAGNOSIS — I10 Essential (primary) hypertension: Secondary | ICD-10-CM | POA: Diagnosis not present

## 2015-01-08 MED ORDER — TRAMADOL HCL 50 MG PO TABS: 50.0000 mg | ORAL_TABLET | Freq: Three times a day (TID) | ORAL | Status: DC | PRN

## 2015-01-08 MED ORDER — ZOLPIDEM TARTRATE 10 MG PO TABS: 10.0000 mg | ORAL_TABLET | Freq: Every evening | ORAL | Status: DC | PRN

## 2015-01-08 NOTE — Patient Instructions (Addendum)
°Insomnia °Insomnia is frequent trouble falling and/or staying asleep. Insomnia can be a long term problem or a short term problem. Both are common. Insomnia can be a short term problem when the wakefulness is related to a certain stress or worry. Long term insomnia is often related to ongoing stress during waking hours and/or poor sleeping habits. Overtime, sleep deprivation itself can make the problem worse. Every little thing feels more severe because you are overtired and your ability to cope is decreased. °CAUSES  °· Stress, anxiety, and depression. °· Poor sleeping habits. °· Distractions such as TV in the bedroom. °· Naps close to bedtime. °· Engaging in emotionally charged conversations before bed. °· Technical reading before sleep. °· Alcohol and other sedatives. They may make the problem worse. They can hurt normal sleep patterns and normal dream activity. °· Stimulants such as caffeine for several hours prior to bedtime. °· Pain syndromes and shortness of breath can cause insomnia. °· Exercise late at night. °· Changing time zones may cause sleeping problems (jet lag). °It is sometimes helpful to have someone observe your sleeping patterns. They should look for periods of not breathing during the night (sleep apnea). They should also look to see how long those periods last. If you live alone or observers are uncertain, you can also be observed at a sleep clinic where your sleep patterns will be professionally monitored. Sleep apnea requires a checkup and treatment. Give your caregivers your medical history. Give your caregivers observations your family has made about your sleep.  °SYMPTOMS  °· Not feeling rested in the morning. °· Anxiety and restlessness at bedtime. °· Difficulty falling and staying asleep. °TREATMENT  °· Your caregiver may prescribe treatment for an underlying medical disorders. Your caregiver can give advice or help if you are using alcohol or other drugs for self-medication.  Treatment of underlying problems will usually eliminate insomnia problems. °· Medications can be prescribed for short time use. They are generally not recommended for lengthy use. °· Over-the-counter sleep medicines are not recommended for lengthy use. They can be habit forming. °· You can promote easier sleeping by making lifestyle changes such as: °¨ Using relaxation techniques that help with breathing and reduce muscle tension. °¨ Exercising earlier in the day. °¨ Changing your diet and the time of your last meal. No night time snacks. °¨ Establish a regular time to go to bed. °· Counseling can help with stressful problems and worry. °· Soothing music and white noise may be helpful if there are background noises you cannot remove. °· Stop tedious detailed work at least one hour before bedtime. °HOME CARE INSTRUCTIONS  °· Keep a diary. Inform your caregiver about your progress. This includes any medication side effects. See your caregiver regularly. Take note of: °¨ Times when you are asleep. °¨ Times when you are awake during the night. °¨ The quality of your sleep. °¨ How you feel the next day. °This information will help your caregiver care for you. °· Get out of bed if you are still awake after 15 minutes. Read or do some quiet activity. Keep the lights down. Wait until you feel sleepy and go back to bed. °· Keep regular sleeping and waking hours. Avoid naps. °· Exercise regularly. °· Avoid distractions at bedtime. Distractions include watching television or engaging in any intense or detailed activity like attempting to balance the household checkbook. °· Develop a bedtime ritual. Keep a familiar routine of bathing, brushing your teeth, climbing into bed at the same   time each night, listening to soothing music. Routines increase the success of falling to sleep faster.  Use relaxation techniques. This can be using breathing and muscle tension release routines. It can also include visualizing peaceful scenes. You can  also help control troubling or intruding thoughts by keeping your mind occupied with boring or repetitive thoughts like the old concept of counting sheep. You can make it more creative like imagining planting one beautiful flower after another in your backyard garden.  During your day, work to eliminate stress. When this is not possible use some of the previous suggestions to help reduce the anxiety that accompanies stressful situations. MAKE SURE YOU:   Understand these instructions.  Will watch your condition.  Will get help right away if you are not doing well or get worse. Document Released: 11/14/2000 Document Revised: 02/09/2012 Document Reviewed: 12/15/2007 [facility] Patient Information 2015 [place], [place]. This information is not intended to replace advice given to you by your health care provider. Make sure you discuss any questions you have with your health care provider.   What are Advance Directives? A living will allows you to document your wishes concerning medical treatments at the end of life.   Before your living will can guide medical decision-making two physicians must certify: You are unable to make medical decisions,  You are in the medical condition specified in the state's living will law (such as "terminal illness" or "permanent unconsciousness"),  Other requirements also may apply, depending upon the state. A medical power of attorney (or healthcare proxy) allows you to appoint a person you trust as your healthcare agent (or surrogate decision maker), who is authorized to make medical decisions on your behalf.   Before a medical power of attorney goes into effect a persons physician must conclude that they are unable to make their own medical decisions. In addition: If a person regains the ability to make decisions, the agent cannot continue to act on the person's behalf.  Many states have additional requirements that apply only to decisions about life-sustaining  medical treatments.  For example, before your agent can refuse a life-sustaining treatment on your behalf, a second physician may have to confirm your doctor's assessment that you are incapable of making treatment decisions. What Else Do I Need to Know?  Advance directives are legally valid throughout the [place]. While you do not need a lawyer to fill out an advance directive, your advance directive becomes legally valid as soon as you sign them in front of the required witnesses. The laws governing advance directives vary from state to state, so it is important to complete and sign advance directives that comply with your state's law. Also, advance directives can have different titles in different states.  Emergency medical technicians cannot honor living wills or medical powers of attorney. Once emergency personnel have been called, they must do what is necessary to stabilize a person for transfer to a hospital, both from accident sites and from a home or other facility. After a physician fully evaluates the person's condition and determines the underlying conditions, advance directives can be implemented.  One states advance directive does not always work in another state. Some states do honor advance directives from another state; others will honor out-of-state advance directives as long as they are similar to the state's own law; and some states do not have an answer to this question. The best solution is if you spend a significant amount of time in more than one state, you  should complete the advance directives for all the states you spend a significant amount of time in.  Advance directives do not expire. An advance directive remains in effect until you change it. If you complete a new advance directive, it invalidates the previous one.  You should review your advance directives periodically to ensure that they still reflect your wishes. If you want to change anything in an advance directive  once you have completed it, you should complete a whole new document. [facility], [email or web address]

## 2015-01-08 NOTE — Progress Notes (Signed)
Subjective:    Patient ID: Joseph [name], male    DOB: 1941-07-21, 74 y.o.   MRN: [id number]  Patient here today for follow up of chronic medical problems. No acute complaints  Today.  Hypertension This is a chronic problem. The current episode started more than 1 year ago. The problem is unchanged. The problem is controlled. Pertinent negatives include no headaches or shortness of breath. Risk factors for coronary artery disease include dyslipidemia and male gender. Past treatments include diuretics and ACE inhibitors. The current treatment provides significant improvement. Compliance problems include diet and exercise.   Hyperlipidemia This is a chronic problem. The current episode started more than 1 year ago. The problem is controlled. Recent lipid tests were reviewed and are normal. He has no history of diabetes, hypothyroidism or obesity. There are no known factors aggravating his hyperlipidemia. Pertinent negatives include no shortness of breath. Current antihyperlipidemic treatment includes statins. The current treatment provides moderate improvement of lipids. Compliance problems include adherence to diet and adherence to exercise.  Risk factors for coronary artery disease include dyslipidemia, hypertension and obesity.  Insomnia Ambien- works well - can't sleep without it BPH Pt voiding without difficulty. He stopped seeing Joseph [name].  GOUT  Usually in his feet- he has not have an attack for some time. Taking allopurinol 330m.   Review of Systems  Respiratory: Negative for shortness of breath.   Musculoskeletal: Negative.   Skin: Negative.   Neurological: Negative for headaches.  Hematological: Negative.   Psychiatric/Behavioral: Negative.   All other systems reviewed and are negative.      Objective:   Physical Exam  Constitutional: He is oriented to person, place, and time. He appears well-developed and well-nourished.  HENT:  Head: Normocephalic.  Right Ear: External ear  normal.  Left Ear: External ear normal.  Nose: Nose normal.  Mouth/Throat: Oropharynx is clear and moist.  Eyes: EOM are normal. Pupils are equal, round, and reactive to light.  Neck: Normal range of motion. Neck supple. No JVD present. No thyromegaly present.  Cardiovascular: Normal rate, regular rhythm, normal heart sounds and intact distal pulses.  Exam reveals no gallop and no friction rub.   No murmur heard. Pulmonary/Chest: Effort normal and breath sounds normal. No respiratory distress. He has no wheezes. He has no rales. He exhibits no tenderness.  Abdominal: Soft. Bowel sounds are normal. He exhibits no mass. There is no tenderness.  Musculoskeletal: Normal range of motion. He exhibits no edema.  Lymphadenopathy:    He has no cervical adenopathy.  Neurological: He is alert and oriented to person, place, and time. No cranial nerve deficit.  Skin: Skin is warm and dry.  Psychiatric: He has a normal mood and affect. His behavior is normal. Judgment and thought content normal.    BP 126/79 mmHg  Pulse 79  Temp(Src) 96.7 F (35.9 C) (Oral)  Ht '5\' 7"'  (1.702 m)  Wt 253 lb (114.76 kg)  BMI 39.62 kg/m2        Assessment & Plan:  1. Hyperlipidemia with target LDL less than 100 Low fat diet - NMR, lipoprofile  2. Essential hypertension, benign Do not add slat to diet - CMP14+EGFR  3. BPH (benign prostatic hyperplasia) Is no longer seeing dr. WJeffie [name] patient choise - PSA, total and free  4. Idiopathic chronic gout of multiple sites without tophus  5. Insomnia Bedtime ritual - zolpidem (AMBIEN) 10 MG tablet; Take 1 tablet (10 mg total) by mouth at bedtime as needed. for sleep  Dispense:  90 tablet; Refill: 1  6. Chronic leg pain, unspecified laterality Keep legs warm at night - traMADol (ULTRAM) 50 MG tablet; Take 1 tablet (50 mg total) by mouth every 8 (eight) hours as needed.  Dispense: 30 tablet; Refill: 1    Labs pending Health maintenance reviewed Diet and  exercise encouraged Continue all meds Follow up  In 3 month   [place], FNP

## 2015-01-09 LAB — CMP14+EGFR
A/G RATIO: 1.5 (ref 1.1–2.5)
ALT: 14 IU/L (ref 0–44)
AST: 23 IU/L (ref 0–40)
Albumin: 4 g/dL (ref 3.5–4.8)
Alkaline Phosphatase: 70 IU/L (ref 39–117)
BUN/Creatinine Ratio: 11 (ref 10–22)
BUN: 11 mg/dL (ref 8–27)
Bilirubin Total: 0.7 mg/dL (ref 0.0–1.2)
CO2: 23 mmol/L (ref 18–29)
CREATININE: 1 mg/dL (ref 0.76–1.27)
Calcium: 9.5 mg/dL (ref 8.6–10.2)
Chloride: 100 mmol/L (ref 97–108)
GFR calc Af Amer: 87 mL/min/{1.73_m2} (ref >59)
GFR, EST NON AFRICAN AMERICAN: 75 mL/min/{1.73_m2} (ref >59)
GLOBULIN, TOTAL: 2.6 g/dL (ref 1.5–4.5)
Glucose: 93 mg/dL (ref 65–99)
Potassium: 4.3 mmol/L (ref 3.5–5.2)
Sodium: 139 mmol/L (ref 134–144)
TOTAL PROTEIN: 6.6 g/dL (ref 6.0–8.5)

## 2015-01-09 LAB — NMR, LIPOPROFILE
Cholesterol: 133 mg/dL (ref 100–199)
HDL Cholesterol by NMR: 38 mg/dL — ABNORMAL LOW (ref >39)
HDL PARTICLE NUMBER: 30.9 umol/L (ref >=30.5)
LDL Particle Number: 914 nmol/L (ref <1000)
LDL SIZE: 20.5 nm (ref >20.5)
LDL-C: 69 mg/dL (ref 0–99)
LP-IR SCORE: 70 — AB (ref <=45)
Small LDL Particle Number: 422 nmol/L (ref <=527)
TRIGLYCERIDES BY NMR: 130 mg/dL (ref 0–149)

## 2015-01-09 LAB — PSA, TOTAL AND FREE
PSA FREE: 0.75 ng/mL
PSA, Free Pct: 22.7 %
PSA: 3.3 ng/mL (ref 0.0–4.0)

## 2015-03-22 ENCOUNTER — Other Ambulatory Visit: Payer: Self-pay | Admitting: Nurse Practitioner

## 2015-03-26 ENCOUNTER — Other Ambulatory Visit: Payer: Self-pay | Admitting: Nurse Practitioner

## 2015-03-27 NOTE — Telephone Encounter (Signed)
Pt aware written Rx is at front desk ready for pickup  

## 2015-03-27 NOTE — Telephone Encounter (Signed)
Ultram rx ready for pick up  

## 2015-03-27 NOTE — Telephone Encounter (Signed)
Last seen 01/28/15  MMM If approved print

## 2015-04-18 ENCOUNTER — Ambulatory Visit (INDEPENDENT_AMBULATORY_CARE_PROVIDER_SITE_OTHER): Payer: Commercial Managed Care - HMO | Admitting: Nurse Practitioner

## 2015-04-18 ENCOUNTER — Encounter: Payer: Self-pay | Admitting: Nurse Practitioner

## 2015-04-18 VITALS — BP 140/85 | HR 84 | Temp 96.6°F | Ht 67.0 in | Wt 260.0 lb

## 2015-04-18 DIAGNOSIS — M1A09X0 Idiopathic chronic gout, multiple sites, without tophus (tophi): Secondary | ICD-10-CM | POA: Diagnosis not present

## 2015-04-18 DIAGNOSIS — N400 Enlarged prostate without lower urinary tract symptoms: Secondary | ICD-10-CM | POA: Diagnosis not present

## 2015-04-18 DIAGNOSIS — G4700 Insomnia, unspecified: Secondary | ICD-10-CM

## 2015-04-18 DIAGNOSIS — I10 Essential (primary) hypertension: Secondary | ICD-10-CM

## 2015-04-18 DIAGNOSIS — I7300 Raynaud's syndrome without gangrene: Secondary | ICD-10-CM | POA: Diagnosis not present

## 2015-04-18 DIAGNOSIS — Z23 Encounter for immunization: Secondary | ICD-10-CM | POA: Diagnosis not present

## 2015-04-18 DIAGNOSIS — E785 Hyperlipidemia, unspecified: Secondary | ICD-10-CM

## 2015-04-18 MED ORDER — ALLOPURINOL 300 MG PO TABS: 300.0000 mg | ORAL_TABLET | Freq: Every day | ORAL | Status: DC

## 2015-04-18 MED ORDER — TRAMADOL HCL 50 MG PO TABS: 50.0000 mg | ORAL_TABLET | Freq: Three times a day (TID) | ORAL | Status: DC | PRN

## 2015-04-18 MED ORDER — NIFEDIPINE ER OSMOTIC RELEASE 30 MG PO TB24: 30.0000 mg | ORAL_TABLET | Freq: Every day | ORAL | Status: DC

## 2015-04-18 MED ORDER — ATORVASTATIN CALCIUM 40 MG PO TABS: 40.0000 mg | ORAL_TABLET | Freq: Every day | ORAL | Status: DC

## 2015-04-18 NOTE — Patient Instructions (Signed)
Raynaud's Syndrome Raynaud's Syndrome is a disorder of the blood vessels in your hands and feet. It occurs when small arteries of the arms/hands or legs/feet become sensitive to cold or emotional upset. This causes the arteries to constrict, or narrow, and reduces blood flow to the area. The color in the fingers or toes changes from white to bluish to red and this is not usually painful. There may be numbness and tingling. Sores on the skin (ulcers) can form. Symptoms are usually relieved by warming. HOME CARE INSTRUCTIONS   Avoid exposure to cold. Keep your whole body warm and dry. Dress in layers. Wear mittens or gloves when handling ice or frozen food and when outdoors. Use holders for glasses or cans containing cold drinks. If possible, stay indoors during cold weather.  Limit your use of caffeine. Switch to decaffeinated coffee, tea, and soda pop. Avoid chocolate.  Avoid smoking or being around cigarette smoke. Smoke will make symptoms worse.  Wear loose fitting socks and comfortable, roomy shoes.  Avoid vibrating tools and machinery.  If possible, avoid stressful and emotional situations. Exercise, meditation and yoga may help you cope with stress. Biofeedback may be useful.  Ask your caregiver about medicine (calcium channel blockers) that may control Raynaud's phenomena. SEEK MEDICAL CARE IF:   Your discomfort becomes worse, despite conservative treatment.  You develop sores on your fingers and toes that do not heal. Document Released: 11/14/2000 Document Revised: 02/09/2012 Document Reviewed: 11/21/2008 [employer] Patient Information 2015 [employer]. This information is not intended to replace advice given to you by your health care provider. Make sure you discuss any questions you have with your health care provider.  

## 2015-04-18 NOTE — Progress Notes (Signed)
Subjective:    Patient ID: Joseph [name], male    DOB: [birth year], 74 y.o.   MRN: [id number]  Patient here today for follow up of chronic medical problems. No acute complaints  Today. Patient says tips of fingers are cold all the time and turn blue- sits with gloves on at home.  Hypertension This is a chronic problem. The current episode started more than 1 year ago. The problem is unchanged. The problem is controlled. Pertinent negatives include no headaches or shortness of breath. Risk factors for coronary artery disease include dyslipidemia and male gender. Past treatments include diuretics and ACE inhibitors. The current treatment provides significant improvement. Compliance problems include diet and exercise.   Hyperlipidemia This is a chronic problem. The current episode started more than 1 year ago. The problem is controlled. Recent lipid tests were reviewed and are normal. He has no history of diabetes, hypothyroidism or obesity. There are no known factors aggravating his hyperlipidemia. Pertinent negatives include no shortness of breath. Current antihyperlipidemic treatment includes statins. The current treatment provides moderate improvement of lipids. Compliance problems include adherence to diet and adherence to exercise.  Risk factors for coronary artery disease include dyslipidemia, hypertension and obesity.  Insomnia Ambien- works well - can't sleep without it BPH Pt voiding without difficulty. He stopped seeing Carmelina Noun.  GOUT  Usually in his feet- he has not have an attack for some time. Taking allopurinol 3110m.   Review of Systems  Respiratory: Negative for shortness of breath.   Musculoskeletal: Negative.   Skin: Negative.   Neurological: Negative for headaches.  Hematological: Negative.   Psychiatric/Behavioral: Negative.   All other systems reviewed and are negative.      Objective:   Physical Exam  Constitutional: He is oriented to person, place, and time. He appears  well-developed and well-nourished.  HENT:  Head: Normocephalic.  Right Ear: External ear normal.  Left Ear: External ear normal.  Nose: Nose normal.  Mouth/Throat: Oropharynx is clear and moist.  Eyes: EOM are normal. Pupils are equal, round, and reactive to light.  Neck: Normal range of motion. Neck supple. No JVD present. No thyromegaly present.  Cardiovascular: Normal rate, regular rhythm, normal heart sounds and intact distal pulses.  Exam reveals no gallop and no friction rub.   No murmur heard. (+) allen test bil hands  Pulmonary/Chest: Effort normal and breath sounds normal. No respiratory distress. He has no wheezes. He has no rales. He exhibits no tenderness.  Abdominal: Soft. Bowel sounds are normal. He exhibits no mass. There is no tenderness.  Musculoskeletal: Normal range of motion. He exhibits no edema.  Lymphadenopathy:    He has no cervical adenopathy.  Neurological: He is alert and oriented to person, place, and time. No cranial nerve deficit.  Skin: Skin is warm and dry.  Psychiatric: He has a normal mood and affect. His behavior is normal. Judgment and thought content normal.    BP 140/85 mmHg  Pulse 84  Temp(Src) 96.6 F (35.9 C) (Oral)  Ht _0  (1.702 m)  Wt 260 lb (117.935 kg)  BMI 40.71 kg/m2         Assessment & Plan:   1. Essential hypertension, benign Do not add salt to diet - CMP14+EGFR  2. BPH (benign prostatic hyperplasia)  3. Insomnia Bedtime ritual  4. Hyperlipidemia with target LDL less than 100 Low fat diet - atorvastatin (LIPITOR) 40 MG tablet; Take 1 tablet (40 mg total) by mouth daily.  Dispense: 90 tablet; Refill: 3 -  NMR, lipoprofile  5. Idiopathic chronic gout of multiple sites without tophus Low purine diet - allopurinol (ZYLOPRIM) 300 MG tablet; Take 1 tablet (300 mg total) by mouth daily.  Dispense: 90 tablet; Refill: 1 - traMADol (ULTRAM) 50 MG tablet; Take 1 tablet (50 mg total) by mouth every 8 (eight) hours as needed.   Dispense: 30 tablet; Refill: 0  6. Severe obesity (BMI >= 40) Discussed diet and exercise for person with BMI >25 Will recheck weight in 3-6 months   7. Raynauds disease Keep hands warm- wear gloves if helps - NIFEdipine (PROCARDIA-XL/ADALAT-CC/NIFEDICAL-XL) 30 MG 24 hr tablet; Take 1 tablet (30 mg total) by mouth daily.  Dispense: 90 tablet; Refill: 1    Labs pending Health maintenance reviewed Diet and exercise encouraged Continue all meds Follow up  In 3 month   [place], FNP

## 2015-04-19 ENCOUNTER — Telehealth: Payer: Self-pay | Admitting: *Deleted

## 2015-04-19 LAB — CMP14+EGFR
ALBUMIN: 4.3 g/dL (ref 3.5–4.8)
ALT: 18 IU/L (ref 0–44)
AST: 18 IU/L (ref 0–40)
Albumin/Globulin Ratio: 1.7 (ref 1.1–2.5)
Alkaline Phosphatase: 71 IU/L (ref 39–117)
BUN/Creatinine Ratio: 12 (ref 10–22)
BUN: 12 mg/dL (ref 8–27)
Bilirubin Total: 0.6 mg/dL (ref 0.0–1.2)
CO2: 25 mmol/L (ref 18–29)
CREATININE: 1.02 mg/dL (ref 0.76–1.27)
Calcium: 9.4 mg/dL (ref 8.6–10.2)
Chloride: 100 mmol/L (ref 97–108)
GFR calc Af Amer: 84 mL/min/{1.73_m2} (ref >59)
GFR calc non Af Amer: 73 mL/min/{1.73_m2} (ref >59)
Globulin, Total: 2.6 g/dL (ref 1.5–4.5)
Glucose: 102 mg/dL — ABNORMAL HIGH (ref 65–99)
Potassium: 4.3 mmol/L (ref 3.5–5.2)
Sodium: 141 mmol/L (ref 134–144)
TOTAL PROTEIN: 6.9 g/dL (ref 6.0–8.5)

## 2015-04-19 LAB — NMR, LIPOPROFILE
CHOLESTEROL: 149 mg/dL (ref 100–199)
HDL Cholesterol by NMR: 43 mg/dL (ref >39)
HDL PARTICLE NUMBER: 31.5 umol/L (ref >=30.5)
LDL Particle Number: 1016 nmol/L — ABNORMAL HIGH (ref <1000)
LDL Size: 20.9 nm (ref >20.5)
LDL-C: 78 mg/dL (ref 0–99)
LP-IR Score: 82 — ABNORMAL HIGH (ref <=45)
Small LDL Particle Number: 492 nmol/L (ref <=527)
TRIGLYCERIDES BY NMR: 142 mg/dL (ref 0–149)

## 2015-04-19 NOTE — Telephone Encounter (Signed)
-----   Message from [facility], [place] sent at 04/19/2015  1:04 PM EDT ----- Kidney and liver function stable Cholesterol looks great Continue current meds- low fat diet and exercise and recheck in 3 months

## 2015-04-19 NOTE — Telephone Encounter (Signed)
Pt notified of results Verbalizes understanding 

## 2015-06-04 DIAGNOSIS — E785 Hyperlipidemia, unspecified: Secondary | ICD-10-CM | POA: Diagnosis not present

## 2015-06-04 DIAGNOSIS — N200 Calculus of kidney: Secondary | ICD-10-CM | POA: Diagnosis not present

## 2015-06-04 DIAGNOSIS — R079 Chest pain, unspecified: Secondary | ICD-10-CM | POA: Diagnosis not present

## 2015-06-04 DIAGNOSIS — R0789 Other chest pain: Secondary | ICD-10-CM | POA: Diagnosis not present

## 2015-06-04 DIAGNOSIS — N400 Enlarged prostate without lower urinary tract symptoms: Secondary | ICD-10-CM | POA: Diagnosis not present

## 2015-06-04 DIAGNOSIS — K219 Gastro-esophageal reflux disease without esophagitis: Secondary | ICD-10-CM | POA: Diagnosis not present

## 2015-06-04 DIAGNOSIS — D1803 Hemangioma of intra-abdominal structures: Secondary | ICD-10-CM | POA: Diagnosis not present

## 2015-06-04 DIAGNOSIS — I712 Thoracic aortic aneurysm, without rupture: Secondary | ICD-10-CM | POA: Diagnosis not present

## 2015-06-04 DIAGNOSIS — R110 Nausea: Secondary | ICD-10-CM | POA: Diagnosis not present

## 2015-06-04 DIAGNOSIS — R1013 Epigastric pain: Secondary | ICD-10-CM | POA: Diagnosis not present

## 2015-06-04 DIAGNOSIS — R0602 Shortness of breath: Secondary | ICD-10-CM | POA: Diagnosis not present

## 2015-06-04 DIAGNOSIS — I359 Nonrheumatic aortic valve disorder, unspecified: Secondary | ICD-10-CM | POA: Diagnosis not present

## 2015-06-04 DIAGNOSIS — I10 Essential (primary) hypertension: Secondary | ICD-10-CM | POA: Diagnosis not present

## 2015-06-05 DIAGNOSIS — K219 Gastro-esophageal reflux disease without esophagitis: Secondary | ICD-10-CM | POA: Diagnosis not present

## 2015-06-05 DIAGNOSIS — I359 Nonrheumatic aortic valve disorder, unspecified: Secondary | ICD-10-CM | POA: Diagnosis not present

## 2015-06-05 DIAGNOSIS — I351 Nonrheumatic aortic (valve) insufficiency: Secondary | ICD-10-CM | POA: Diagnosis not present

## 2015-06-05 DIAGNOSIS — R0602 Shortness of breath: Secondary | ICD-10-CM | POA: Diagnosis not present

## 2015-06-05 DIAGNOSIS — E785 Hyperlipidemia, unspecified: Secondary | ICD-10-CM | POA: Diagnosis not present

## 2015-06-05 DIAGNOSIS — D1803 Hemangioma of intra-abdominal structures: Secondary | ICD-10-CM | POA: Diagnosis not present

## 2015-06-05 DIAGNOSIS — N200 Calculus of kidney: Secondary | ICD-10-CM | POA: Diagnosis not present

## 2015-06-05 DIAGNOSIS — I517 Cardiomegaly: Secondary | ICD-10-CM | POA: Diagnosis not present

## 2015-06-05 DIAGNOSIS — I10 Essential (primary) hypertension: Secondary | ICD-10-CM | POA: Diagnosis not present

## 2015-06-05 DIAGNOSIS — R0789 Other chest pain: Secondary | ICD-10-CM | POA: Diagnosis not present

## 2015-06-05 DIAGNOSIS — R1013 Epigastric pain: Secondary | ICD-10-CM | POA: Diagnosis not present

## 2015-06-06 DIAGNOSIS — R079 Chest pain, unspecified: Secondary | ICD-10-CM | POA: Diagnosis not present

## 2015-06-06 DIAGNOSIS — K30 Functional dyspepsia: Secondary | ICD-10-CM | POA: Diagnosis not present

## 2015-06-06 DIAGNOSIS — D1803 Hemangioma of intra-abdominal structures: Secondary | ICD-10-CM | POA: Diagnosis not present

## 2015-06-06 DIAGNOSIS — R0602 Shortness of breath: Secondary | ICD-10-CM | POA: Diagnosis not present

## 2015-06-06 DIAGNOSIS — R0789 Other chest pain: Secondary | ICD-10-CM | POA: Diagnosis not present

## 2015-06-06 DIAGNOSIS — R1013 Epigastric pain: Secondary | ICD-10-CM | POA: Diagnosis not present

## 2015-06-06 DIAGNOSIS — K219 Gastro-esophageal reflux disease without esophagitis: Secondary | ICD-10-CM | POA: Diagnosis not present

## 2015-06-06 DIAGNOSIS — E785 Hyperlipidemia, unspecified: Secondary | ICD-10-CM | POA: Diagnosis not present

## 2015-06-06 DIAGNOSIS — I712 Thoracic aortic aneurysm, without rupture: Secondary | ICD-10-CM | POA: Diagnosis not present

## 2015-06-06 DIAGNOSIS — I10 Essential (primary) hypertension: Secondary | ICD-10-CM | POA: Diagnosis not present

## 2015-06-06 DIAGNOSIS — N200 Calculus of kidney: Secondary | ICD-10-CM | POA: Diagnosis not present

## 2015-06-06 DIAGNOSIS — I359 Nonrheumatic aortic valve disorder, unspecified: Secondary | ICD-10-CM | POA: Diagnosis not present

## 2015-06-12 ENCOUNTER — Other Ambulatory Visit: Payer: Self-pay | Admitting: Nurse Practitioner

## 2015-06-12 ENCOUNTER — Ambulatory Visit (INDEPENDENT_AMBULATORY_CARE_PROVIDER_SITE_OTHER): Payer: Commercial Managed Care - HMO | Admitting: Physician Assistant

## 2015-06-12 ENCOUNTER — Encounter: Payer: Self-pay | Admitting: Physician Assistant

## 2015-06-12 VITALS — BP 138/83 | HR 107 | Temp 98.3°F | Ht 67.0 in | Wt 258.0 lb

## 2015-06-12 DIAGNOSIS — I712 Thoracic aortic aneurysm, without rupture: Secondary | ICD-10-CM

## 2015-06-12 DIAGNOSIS — M1A09X0 Idiopathic chronic gout, multiple sites, without tophus (tophi): Secondary | ICD-10-CM

## 2015-06-12 DIAGNOSIS — I7121 Aneurysm of the ascending aorta, without rupture: Secondary | ICD-10-CM

## 2015-06-12 MED ORDER — TRAMADOL HCL 50 MG PO TABS: 50.0000 mg | ORAL_TABLET | Freq: Three times a day (TID) | ORAL | Status: DC | PRN

## 2015-06-12 NOTE — Progress Notes (Signed)
   Subjective:    Patient ID: Joseph [name], male    DOB: 03/29/1941, 74 y.o.   MRN: [id number]  HPI 74 y/o male with h/o HTN, hyperlipidemia, obesity presents for referral to Cardiothoracic surgeon after being diagnosed with a fusiform 4.8 cm ascending aortic aneurysm without dissection or acute vascular process on 06/12/15, seen on CT at [facility] after he presented to the ED with c/o SOB.   He would also like a refill of his Tramadol today for his gout     Review of Systems  Constitutional: Positive for diaphoresis.  Respiratory: Positive for shortness of breath.   Cardiovascular: Positive for chest pain (occasional ). Negative for leg swelling.  Gastrointestinal:       GERD  Psychiatric/Behavioral: The patient is nervous/anxious.        Objective:   Physical Exam  Constitutional: He is oriented to person, place, and time. He appears well-developed and well-nourished. No distress.  HENT:  Head: Normocephalic and atraumatic.  Cardiovascular: Normal rate, regular rhythm and normal heart sounds.  Exam reveals no gallop and no friction rub.   No murmur heard. Abdominal:  Abdominal obesity   Musculoskeletal: He exhibits no edema.  Neurological: He is alert and oriented to person, place, and time.  Skin: He is not diaphoretic.  Psychiatric: He has a normal mood and affect. His behavior is normal. Judgment and thought content normal.  Nursing note and vitals reviewed.  Filed Vitals:   06/12/15 1012  BP: 138/83  Pulse: 107  Temp: 98.3 F (36.8 C)          Assessment & Plan:  1. Ascending aortic aneurysm  - Ambulatory referral to Cardiothoracic Surgery  2. Idiopathic chronic gout of multiple sites without tophus  - traMADol (ULTRAM) 50 MG tablet; Take 1 tablet (50 mg total) by mouth every 8 (eight) hours as needed.  Dispense: 60 tablet; Refill: 0    Tiffany A. Benjamin Stain PA-C

## 2015-06-12 NOTE — Telephone Encounter (Signed)
Last seen 04/18/15 MMM If approved print

## 2015-06-18 ENCOUNTER — Other Ambulatory Visit: Payer: Self-pay

## 2015-06-18 ENCOUNTER — Encounter: Payer: Self-pay | Admitting: *Deleted

## 2015-06-20 ENCOUNTER — Encounter: Payer: Self-pay | Admitting: Cardiothoracic Surgery

## 2015-06-20 ENCOUNTER — Institutional Professional Consult (permissible substitution) (INDEPENDENT_AMBULATORY_CARE_PROVIDER_SITE_OTHER): Payer: Commercial Managed Care - HMO | Admitting: Cardiothoracic Surgery

## 2015-06-20 VITALS — BP 139/82 | HR 86 | Resp 16 | Ht 68.0 in | Wt 251.0 lb

## 2015-06-20 DIAGNOSIS — I712 Thoracic aortic aneurysm, without rupture, unspecified: Secondary | ICD-10-CM

## 2015-06-20 NOTE — Progress Notes (Signed)
[address]       [place] [address]             [phone]                    Cataldo F Latif [place] Medical Record #[id number] Date of Birth: [birth year]  Referring: Hinda Lenis Primary Care: Marline Backbone, PA-C  Chief Complaint:    Chief Complaint  Patient presents with  . TAA    eval..Marland KitchenCT CHEST @ [facility]    History of Present Illness:    Joseph [name] 74 y.o. male is seen in the office in follow-up after a recent admission to [facility]. The patient notes that on July 4 holiday he developed epigastric and substernal chest pain radiating to the left neck into both arms, with numbness in his hands. At the time he was camping at [facility] and went to the [facility] emergency room and was admitted. He was hospitalized for several days. No cardiac catheterization was performed. I do not have any results of cardiac enzymes. The patient did have a CT scan of the chest which revealed a 4.8 cm dilatation of the ascending aorta. On discharge the patient was told to see a thoracic surgeon. He comes to the office today at the urging of Tiffany Capri PA, at medical practice in [place]. He denies any similar symptoms since discharge home but was told to limit his activity. He does note that he fatigues easily and has shortness of breath with exertion. He has chronic lower extremity edema    Current Activity/ Functional Status:  Patient is independent with mobility/ambulation, transfers, ADL's, IADL's.   Zubrod Score: At the time of surgery this patient's most appropriate activity status/level should be described as: []     0    Normal activity, no symptoms [x]     1    Restricted in physical strenuous activity but ambulatory, able to do out light work []     2    Ambulatory and capable of self care, unable to do work activities, up and about               >50 % of waking hours                              []     3    Only limited self care, in bed greater  than 50% of waking hours []     4    Completely disabled, no self care, confined to bed or chair []     5    Moribund   Past Medical History  Diagnosis Date  . GERD (gastroesophageal reflux disease)   . Hypertension   . Anxiety   . Insomnia   . Gout   . History of kidney stones     Past Surgical History  Procedure Laterality Date  . Hernia repair    . Kidney stone surgery      Family History  Problem Relation Age of Onset  . Heart disease Father   . Diabetes Sister   . Heart disease Brother   . Deep vein thrombosis Brother   . Congestive Heart Failure Brother    patient has history of sudden death of his father at age 17 and his brother at age 55-  these were attributed to myocardial infarctions but there is no documented autopsy .  The patient notes both of them died at home suddenly    History   Social History  . Marital Status: Married    Spouse Name: N/A  . Number of Children: N/A  . Years of Education: N/A   Occupational History  . Not on file.   Social History Main Topics  . Smoking status: Former Smoker -- 0.50 packs/day for 1 years    Types: Cigarettes    Quit date: 03/22/1958  . Smokeless tobacco: Former Systems developer    Types: Chew     Comment: [place]  . Alcohol Use: No  . Drug Use: No  . Sexual Activity: No   Other Topics Concern  . Not on file   Social History Narrative    History  Smoking status  . Former Smoker -- 0.50 packs/day for 1 years  . Types: Cigarettes  . Quit date: 03/22/1958  Smokeless tobacco  . Former Systems developer  . Types: Chew    Comment: QUIT CHEWING IN 1989    History  Alcohol Use No     Allergies  Allergen Reactions  . Carafate [Sucralfate] Other (See Comments)    TOOK SKIN FROM HIS MOUTH  . Ciprofloxacin Hcl Other (See Comments)    CAN'T REMEMBER  . Prevacid [Lansoprazole] Other (See Comments)    MADE HIM FEEL BAD  . Sulfa Antibiotics Diarrhea    SEVERE    Current Outpatient Prescriptions  Medication Sig  Dispense Refill  . allopurinol (ZYLOPRIM) 300 MG tablet Take 1 tablet (300 mg total) by mouth daily. 90 tablet 1  . aspirin 81 MG tablet Take 81 mg by mouth daily.    Marland Kitchen atorvastatin (LIPITOR) 40 MG tablet Take 1 tablet (40 mg total) by mouth daily. 90 tablet 3  . benazepril (LOTENSIN) 20 MG tablet Take 1 tablet (20 mg total) by mouth daily. 90 tablet 3  . fish oil-omega-3 fatty acids 1000 MG capsule Take 2 g by mouth daily.    . hydrochlorothiazide (HYDRODIURIL) 25 MG tablet Take 1 tablet (25 mg total) by mouth daily. 90 tablet 3  . Multiple Vitamins-Minerals (COMPLETE MULTIVITAMIN/MINERAL PO) Take 1 tablet by mouth daily.    Marland Kitchen NIFEdipine (PROCARDIA-XL/ADALAT-CC/NIFEDICAL-XL) 30 MG 24 hr tablet Take 1 tablet (30 mg total) by mouth daily. 90 tablet 1  . Potassium 99 MG TABS Take 1 tablet by mouth daily.    . traMADol (ULTRAM) 50 MG tablet Take 1 tablet (50 mg total) by mouth every 8 (eight) hours as needed. (Patient taking differently: Take 50 mg by mouth every 8 (eight) hours as needed. USUALLY TAKES I/2 TAB) 60 tablet 0  . zolpidem (AMBIEN) 10 MG tablet Take 1 tablet (10 mg total) by mouth at bedtime as needed. for sleep 90 tablet 1   No current facility-administered medications for this visit.      Review of Systems:     Cardiac Review of Systems: Y or N  Chest Pain [  y  ]  Resting SOB [ y  ] Exertional SOB  Blue.Reese  ]  Orthopnea Blue.Reese  ]   Pedal Edema Blue.Reese   ]    Palpitations [ n ] Syncope  [n  ]   Presyncope [  y ]  General Review of Systems: [Y] = yes [  ]=no Constitional: recent weight change [  ];  Wt loss over the last 3 months [   ] anorexia [  ]; fatigue Blue.Reese  ]; nausea [  ]; night sweats [  n  ]; fever [  ]; or chills [  ];          Dental: poor dentition[  ]; Last Dentist visit:   Eye : blurred vision [  ]; diplopia [   ]; vision changes [  ];  Amaurosis fugax[  ]; Resp: cough [  ];  wheezing[  ];  hemoptysis[  ]; shortness of breathy[  ]; paroxysmal nocturnal dyspnea[  ]; dyspnea on  exertion[  ]; or orthopnea[  ];  GI:  gallstones[  ], vomiting[  ];  dysphagia[  ]; melena[  ];  hematochezia [  ]; heartburn[  ];   Hx of  Colonoscopy[ pylops removed ?10 years ago ]; GU: kidney stones [  ]; hematuria[  ];   dysuria [  ];  nocturia[  ];  history of     obstruction [  ]; urinary frequency [  ]             Skin: rash, swelling[  ];, hair loss[  ];  peripheral edema[  ];  or itching[  ]; Musculosketetal: myalgias[  ];  joint swelling[  ];  joint erythema[  ];  joint pain[  ];  back pain[  ];  Heme/Lymph: bruising[  ];  bleeding[  ];  anemia[  ];  Neuro: TIA[n  ];  headaches[ n ];  stroke[ n ];  vertigo[  ];  seizures[  ];   paresthesias[  ];  difficulty walking[  ];  Psych:depression[  ]; anxiety[  ];  Endocrine: diabetes[ n ];  thyroid dysfunction[n  ];  Immunizations: Flu up to date Blue.Reese  ]; Pneumococcal up to date Blue.Reese  ];  Other:  Physical Exam: BP 139/82 mmHg  Pulse 86  Resp 16  Ht 5\' 8"  (1.727 m)  Wt 251 lb (113.853 kg)  BMI 38.17 kg/m2  SpO2 95%  PHYSICAL EXAMINATION: General appearance: alert, cooperative, appears older than stated age, no distress and morbidly obese Head: Normocephalic, without obvious abnormality, atraumatic Neck: no adenopathy, no carotid bruit, no JVD, supple, symmetrical, trachea midline and thyroid not enlarged, symmetric, no tenderness/mass/nodules Lymph nodes: Cervical, supraclavicular, and axillary nodes normal. Resp: clear to auscultation bilaterally Back: symmetric, no curvature. ROM normal. No CVA tenderness. 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 the knee, he does have palpable DP and PT pulses bilaterally  Diagnostic  Studies & Laboratory data:     Recent Radiology Findings:  Results are viewed on disk will be loaded int pacs system     Recent Lab Findings: Lab Results  Component Value Date   WBC 7.1 03/09/2014   HGB 14.3 03/09/2014   HCT 42.2 03/09/2014   PLT 212 03/09/2014   GLUCOSE 102* 04/18/2015   CHOL 149 04/18/2015   TRIG 142 04/18/2015   HDL 43 04/18/2015   LDLCALC 65 07/10/2014   ALT 18 04/18/2015   AST 18 04/18/2015   NA 141 04/18/2015   K 4.3 04/18/2015   CL 100 04/18/2015   CREATININE 1.02 04/18/2015   BUN 12 04/18/2015   CO2 25 04/18/2015   INR 0.9 08/16/2007   Stress test:done [facility]  CLINICAL DATA:  Chest pain  EXAM: MYOCARDIAL IMAGING WITH SPECT (  REST AND PHARMACOLOGIC-STRESS)  GATED LEFT VENTRICULAR WALL MOTION STUDY  LEFT VENTRICULAR EJECTION FRACTION  TECHNIQUE: Standard myocardial SPECT imaging was performed after resting intravenous injection of 11.0 mCi Tc-89m sestamibi. Subsequently, intravenous infusion of Lexiscan was performed under the supervision of the Cardiology staff. At peak effect of the drug, 32.6 mCi Tc-81m sestamibi was injected intravenously and standard myocardial SPECT imaging was performed. Quantitative gated imaging was also performed to evaluate left ventricular wall motion, and estimate left ventricular ejection fraction.  COMPARISON:  None.  FINDINGS: Perfusion: Regional images of the left ventricular myocardium show no fixed defect to suggest infarct or reversible lesion to suggest ischemic change. No abnormality is identified on the SPECT study.  Wall Motion: Left ventricle is rather prominent. There is mild apical hypokinesis. No other wall motion abnormality is identified in the left ventricle.  Left Ventricular Ejection Fraction: 47 %  End diastolic volume 258 ml  End systolic volume 69 ml  IMPRESSION: 1. No reversible ischemia or infarction.  2. There is left ventricular prominence. There is mild  apical hypokinesis. No other wall motion abnormalities are identified.  3. Left ventricular ejection fraction 47%  4. Intermediate risk stress test findings*.  *2012 Appropriate Use Criteria for Coronary Revascularization Focused Update: J Am Coll Cardiol. 5277;82(4):235-361. http://content.airportbarriers.com.aspx?articleid=1201161   Electronically Signed   By: Lowella Grip III M.D.   On: 06/06/2015 13:05  No significant chest pain symptoms reported  No significant arrhythmia noted  This is not a final report, NM images to follow  No significant ST segment changes or arrhythmias were noted during  stress  Uneventful Lexiscan injection.  Nuclear perfusion report to follow under separate cover  CT: CLINICAL DATA:  Epigastric pain. Shortness of breath, nausea. Back pain. History of hypertension, Raynaud's disease, hernia repair.  EXAM: CT ANGIOGRAPHY CHEST, ABDOMEN AND PELVIS  TECHNIQUE: Multidetector CT imaging through the chest, abdomen and pelvis was performed using the standard protocol during bolus administration of intravenous contrast. Multiplanar reconstructed images and MIPs were obtained and reviewed to evaluate the vascular anatomy.  CONTRAST:  100 cc Omnipaque 350  COMPARISON:  None.  FINDINGS: CTA CHEST FINDINGS  Mediastinum: Fusiform ascending aorta is enlarged, 4.8 cm with moderate calcific atherosclerosis of the aortic arch and descending aorta. Subcentimeter focal intimal hematoma descending aorta. No abnormal density within upper along the course of the aorta. Small amount of free fluid in the is sulcus, normal. No dissection, suspicious luminal irregularity/ulceration, contrast extravasation or periaortic hematoma. Heart size is mildly enlarged. Mild coronary artery calcifications. No pericardial fluid collection. No mediastinal lymphadenopathy.  Lungs: No pleural effusion or focal consolidation. Minimal pleural thickening/scarring  of the RIGHT major fissure. Tracheobronchial tree is patent and midline, no pneumothorax.  Soft tissues and osseous structures: Mild degenerative change of the thoracic spine. No destructive bony lesions. Mild gynecomastia.  Review of the MIP images confirms the above findings.  CTA ABDOMEN AND PELVIS FINDINGS  Retroperitoneum: Aortoiliac vessels are normal in course and caliber with moderate to severe calcific atherosclerosis. Celiac trunk origin is widely patent. Mild calcific atherosclerosis of the origin of the renal arteries, superior mesenteric artery. Inferior mesenteric artery is patent.  Organs: 2.5 cm flash filling hemangioma RIGHT lobe of the liver. 2.3 cm low-density cyst segment 4. No intrahepatic biliary dilatation. Spleen, gallbladder are normal. Fatty pancreas, otherwise unremarkable. Thickened adrenal glands can be seen with hyperplasia without discrete nodule.  GI tract: Small hiatal hernia. Stomach, small large bowel are normal in course and caliber without inflammatory  changes though sensitivity may be decreased by lack of enteric contrast.  GU tract: Kidneys are orthotopic, normal size morphology. 4.7 cm exophytic LEFT lower pole cyst. 3 mm LEFT upper pole nonobstructing nephrolithiasis. 2.2 cm exophytic RIGHT interpolar cyst. Prostate is partially distended. Moderate prostatomegaly invading the base of the bladder.  Peritoneum: No intraperitoneal free fluid or free air. No lymphadenopathy by CT size criteria.  Soft tissues and osseous structures: Moderate to large LEFT, small RIGHT fat containing inguinal hernias. Small fat containing umbilical hernia. Grade 1 L5-S1 anterolisthesis, chronic bilateral L5 pars interarticularis defects, moderate to severe L5-S1 neural foraminal narrowing.  Review of the MIP images confirms the above findings.  IMPRESSION: CTA CHEST: Fusiform 4.8 cm ascending aortic aneurysm without dissection or acute vascular process.  Recommend semi-annual imaging followup by CTA or MRA and referral to cardiothoracic surgery if not already obtained. This recommendation follows 2010 ACCF/AHA/AATS/ACR/ASA/SCA/SCAI/SIR/STS/SVM Guidelines for the Diagnosis and Management of Patients With Thoracic Aortic Disease. Circulation. 2010; 121: G921-J941.  No acute cardiopulmonary process.  CTA ABDOMEN AND PELVIS: Atherosclerosis of the aortoiliac vessels without high-grade stenosis or acute vascular process.  No acute intra-abdominal or pelvic process. 3 mm nonobstructing LEFT upper pole nephrolithiasis.  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  Prostatomegaly.   Electronically Signed   By: Elon Alas M.D.   On: 06/05/2015 00:21  ECHO: Transthoracic Echocardiography Report (TTE)  Demographics  Patient Name      Joseph [name]     Gender                Male  Patient Number    [id number]   Race                  Caucasian  Visit Number      [id number]    Room Number           588  Accession Number  [id number]  Date of Study         06/05/2015  Date of Birth     10/13/1941     Referring Physician   ED Fay Records, MD  Age               20 year(s)     Sonographer           Marikate Tennis,                                                         RDCS, BS                                   Interpreting          Rozann Lesches, MD                                   Physician Procedure Type of Study  TTE procedure: ECHOCARDIOGRAM FOLLOW UP/ LIMITED W DOPPLER. Procedure date Date: 06/05/2015 Start: 09:20 AM Technical Quality: Adequate visualization Study Location: Portable Indications: Chest pain. Patient Status: Routine Height: 68.11 inches Weight: 255.74 pounds BSA: 2.27 m2 BMI: 38.76 kg/m2 Rhythm: Normal sinus rhythm HR: 82 bpm BP: 116/69 mmHg Conclusions Summary Mild concentric left ventricular hypertrophy Normal left ventricular  systolic function with no appreciable segmental abnormality. Mild  AR. Signature ------------------------------------------------------------------------------  Electronically signed by Rozann Lesches, MD(Interpreting physician) on 06/05/2015  01:27 PM ------------------------------------------------------------------------------ Findings Mitral Valve Structurally normal mitral valve with good mobility and no significant regurgitation by color flow doppler examination. Aortic Valve Structurally normal aortic valve with good leaflet mobility, and mild regurgitation by color flow doppler examination. Tricuspid Valve Tricuspid valve is structurally normal. Mild tricuspid regurgitation by color flow doppler examination. Pulmonic Valve Pulmonic valve is structurally normal. No Doppler evidence of pulmonic stenosis or insufficiency. Left Atrium Normal size left atrium. Left Ventricle Mild concentric left ventricular hypertrophy Normal left ventricular systolic function with no appreciable segmental abnormality. Right Atrium Mild right atrial enlargement Right Ventricle Mildly dilated right ventricle. Pericardial Effusion No evidence of pericardial effusion. Miscellaneous Aortic root dimension within normal limits. IVC dilated. M-Mode/2D Measurements & Calculations  LV Diastolic Dimension:  LV Systolic Dimension:     LA Dimension: 3.42 cmAO  5.56 cm                  2.92 cm                    Root Dimension: 3.89 cm  LV FS:47.48 %            LV Volume Diastolic: 768  LV PW Diastolic: 1.4 cm  ml  Septum Diastolic: 1.15   LV Volume Systolic: 72.6  cm                       ml                           LV EDV/LV EDV Index: 127   LA/Aorta: 0.88                           ml/56 m2LV ESV/LV ESV                           Index: 50.5 ml/22 m2                           EF Calculated: 60.24 % Doppler Measurements & Calculations  MV Peak E-Wave: 72.7 cm/s AV Peak Velocity: 111  MV Peak A-Wave: 86.4 cm/s cm/s                     Estimated RVSP: 32.8 mmHg  MV  E/A Ratio: 0.84        AV Peak Gradient: 4.93   Estimated RAP:10 mmHg  MV Peak Gradient: 2.11    mmHg  mmHg                                                     PV Peak Velocity: 102  TDI E' Velocity: 15 cm/s                           cm/s  PV Peak Gradient: 4.16                                                     mmHg  E/E' prime 4.9 [address] [place], [place] [address] [phone]   Aortic Size Index=  4.8       /Body surface area is 2.34 meters squared. = 2.05    < 2.75 cm/m2      4% risk per year 2.75 to 4.25          8% risk per year > 4.25 cm/m2    20% risk per year  A  Assessment / Plan:   Fusiform 4.8 cm ascending aortic aneurysm without dissection or acute vascular process with recent admission to [facility] for chest pain- although the patient had a stress test with No reversible ischemia or infarction.  Left ventricular ejection fraction 47% . Intermediate risk stress test findings. Echocardiogram reveals no evidence of bicuspid aortic valve.-I've discussed with patient the finding of a 4.8 cm aneurysm is approximately 4% risk per year of acute aortic dissection or rupture. The disease process and signs and symptoms are discussed in detail with the patient and his family. At this point is unclear if he also has coronary artery disease but is surely at high risk and will be referred to establish care by cardiologist. I plan to see him back in 6 months with a follow-up CTA of the chest to evaluate his ascending aorta  We discussed the need to lose weight and obtain good blood pressure control.  I  spent 72minutes counseling the patient face to face and 50% or more the  time was spent in counseling and coordination of care. The total time spent in the appointment was 60 minutes.  Grace Isaac MD      [address] [place] [address] Office [phone]   Beeper [phone]  06/20/2015 2:57  PM

## 2015-06-20 NOTE — Patient Instructions (Signed)
Thoracic Aortic Aneurysm An aneurysm is a bulge in an artery. It happens when the wall of the artery is weakened or damaged. If the aneurysm gets too big, it bursts (ruptures) and severe bleeding occurs. A thoracic aortic aneurysm is an aneurysm that occurs in the first part of the aorta, between the heart and the diaphragm. The aorta is the main artery and supplies blood from the heart to the rest of the body. A thoracic aortic aneurysm can enlarge and rupture or blood can flow between the layers of the wall of the aorta through a tear (aorticdissection). Both of these conditions can cause bleeding inside the body and can be life threatening unless diagnosed and treated promptly. CAUSES  The exact cause of a thoracic aortic aneurysm is often unknown. Some contributing factors are:   A hardening of the arteries caused by the buildup of fat and other substances in the lining of a blood vessel (arteriosclerosis).  Inflammation of the walls of an artery (arteritis).  Connective tissue diseases, such as Marfan syndrome.  Injury or trauma to the aorta.  An infection, such as syphilis or staphylococcus, in the wall of the aorta (infectious aortitis) caused by bacteria. RISK FACTORS  Risk factors that contribute to a thoracic aortic aneurysm may include:  Age older than 19 years.  High blood pressure (hypertension).  Male gender.  Ethnicity (white race).  Obesity.  Family history of aneurysm (first degree relatives only).  Tobacco use. PREVENTION  The following healthy lifestyle habits may help decrease your risk of a thoracic aortic aneurysm:  Quitting smoking. Smoking can raise your blood pressure and cause arteriosclerosis.  Limiting or avoiding alcohol.  Keeping your blood pressure, blood sugar level, and cholesterol levels within normal limits.  Decreasing your salt intake. In some people, too much salt can raise blood pressure and increase your risk of abdominal aortic  aneurysm.  Eating a diet low in saturated fats and cholesterol.  Increasing your fiber intake by including whole grains, vegetables, and fruits in your diet. Eating these foods may help lower blood pressure.  Maintaining a healthy weight.  Staying physically active and exercising regularly. SYMPTOMS  The symptoms of thoracic aortic aneurysm may vary depending on the size and rate of growth of the aneurysm. Most grow slowly and do not have any symptoms. When symptoms do occur, they may include:  Pain (chest, back, sides, or abdomen). The pain may vary in intensity. A sudden onset of severe pain may indicate that the aneurysm has ruptured.  Hoarseness.  Cough.  Shortness of breath.  Swallowing problems.  Nausea or vomiting or both. DIAGNOSIS  Since most unruptured thoracic aortic aneurysms have no symptoms, they are often discovered during diagnostic exams for other conditions. An aneurysm may be found during the following procedures:  Ultrasonography (a one-time screening for thoracic aortic aneurysm by ultrasonography is also recommended for all men aged 63-75 years who have ever smoked).  X-ray exams.  A CT scan.  An MRI.  Angiography or arteriography. TREATMENT  Treatment of a thoracic aortic aneurysm depends on the size of your aneurysm, your age, and risk factors for rupture. Medicine to control blood pressure and pain may be used to manage aneurysms smaller than 2.3 in (6 cm). Regular monitoring for enlargement may be recommended by your health care provider if:  The aneurysm is 1.2-1.5 in (3-4 cm) in size (an annual ultrasonography may be recommended).  The aneurysm is 1.5-1.8 in (4-4.5 cm) in size (an ultrasonography every 6  months may be recommended).  The aneurysm is larger than 1.8 in (4.5 cm) in size (your health care provider may ask that you be examined by a vascular surgeon). If your aneurysm is larger than 2.2 in (5.5 cm) or if it is enlarging quickly,  surgical repair may be recommended. There are two main methods for repair of an aneurysm:   Endovascular repair (a minimally invasive surgery).  Open repair. This method is used if an endovascular repair is not possible. Document Released: 11/17/2005 Document Revised: 09/07/2013 Document Reviewed: 05/30/2013 [facility] Patient Information 2015 [place], [place]. This information is not intended to replace advice given to you by your health care provider. Make sure you discuss any questions you have with your health care provider.  Aortic Dissection Aortic dissection happens when there is a tear in the inner wall of the aorta. The aorta is the largest blood vessel in the body. It carries blood from the heart to the arteries, and those arteries send blood to all parts of the body. When there is a tear, the blood enters inside the aortic wall and creates a new space for the blood. This causes the aorta to split even more. The collection of blood into this new space may lower the amount of blood that reaches the rest of the body. A dissection causes the aortic wall to be weakened and can cause rupture. Death can occur. It is critical to have medical care right away. CAUSES  Aortic dissection is caused by two things:  A small tear that develops in a weak or injured part of the aortic wall.  The tear spreads inside the aortic wall and the aorta becomes "double-barreled." The following increase the risk of aortic dissection:  High blood pressure.  Hardening of the walls of arteries.  Use of cocaine.  Blunt injury to the chest.  Genetic disorders that affect the connective tissue (one example is Marfan syndrome).  Problems (such as infection) that affect either the aorta or the heart valve.  Problems that affect the tissue that holds together different parts of your body (connective tissue). For example, a few uncommon arthritis problems can increase the chance of an aortic dissection. SYMPTOMS     Sudden onset of severe chest pain. The pain may be sharp, stabbing, or ripping in nature.  The pain may then shift to the shoulder, arm, neck, jaw, abdomen, or hips. Other symptoms may include:  Breathing trouble.  Weakness of any part of the body.  Decreased feeling of any part of the body.  Fainting.  Dizziness.  Feeling sick to your stomach (nausea) and vomiting.  Sweating a lot.  Confusion/dazed. DIAGNOSIS  Testing may include the following:  Electrocardiogram.  Chest x-ray.  Imaging study done after injecting a dye to clearly view the blood vessel (aortic angiography).  Specialized x-ray of the chest is done after injecting a dye (CT scan) or an MRI.  A test where the image of the heart is studied using sound waves (Echocardiogram). TREATMENT  Treatment will vary depending on what part of the aorta has the problem. Your caregiver may admit you into an intensive care unit. Medicines that reduce the blood pressure and strong painkillers may be given right away. In many cases, heart medicines may be given to relieve the symptoms and help blood pressure.  If medicines are used first and they do not help, then surgery may be done to repair or replace the damaged portion of the aorta. In some cases, surgery is  needed right away. You may have to stay in the hospital for 7-10 days. If the aortic valve is damaged due to aortic dissection, repair or replacement of the valve may be done. If the arteries of the heart are affected, bypass surgery of the heart may be done. THIS IS AN EMERGENCY. Call your local emergency services (911 in [place] right away or the closest medical facility.  Document Released: 02/24/2008 Document Revised: 09/07/2013 Document Reviewed: 02/24/2008 [facility] Patient Information 2015 [place], [place]. This information is not intended to replace advice given to you by your health care provider. Make sure you discuss any questions you have with your health care  provider.

## 2015-06-22 ENCOUNTER — Ambulatory Visit: Payer: Self-pay | Admitting: Cardiology

## 2015-06-22 ENCOUNTER — Encounter: Payer: Self-pay | Admitting: *Deleted

## 2015-06-22 ENCOUNTER — Encounter: Payer: Self-pay | Admitting: Cardiology

## 2015-06-22 ENCOUNTER — Other Ambulatory Visit: Payer: Self-pay | Admitting: Cardiology

## 2015-06-22 ENCOUNTER — Telehealth: Payer: Self-pay | Admitting: Cardiology

## 2015-06-22 ENCOUNTER — Ambulatory Visit (INDEPENDENT_AMBULATORY_CARE_PROVIDER_SITE_OTHER): Payer: Commercial Managed Care - HMO | Admitting: Cardiology

## 2015-06-22 VITALS — BP 126/66 | HR 98

## 2015-06-22 DIAGNOSIS — R9439 Abnormal result of other cardiovascular function study: Secondary | ICD-10-CM | POA: Diagnosis not present

## 2015-06-22 DIAGNOSIS — I208 Other forms of angina pectoris: Secondary | ICD-10-CM

## 2015-06-22 DIAGNOSIS — I712 Thoracic aortic aneurysm, without rupture: Secondary | ICD-10-CM

## 2015-06-22 DIAGNOSIS — I2089 Other forms of angina pectoris: Secondary | ICD-10-CM

## 2015-06-22 DIAGNOSIS — I7121 Aneurysm of the ascending aorta, without rupture: Secondary | ICD-10-CM

## 2015-06-22 DIAGNOSIS — I10 Essential (primary) hypertension: Secondary | ICD-10-CM

## 2015-06-22 NOTE — Patient Instructions (Signed)
Your physician has requested that you have a cardiac catheterization. Cardiac catheterization is used to diagnose and/or treat various heart conditions. Doctors may recommend this procedure for a number of different reasons. The most common reason is to evaluate chest pain. Chest pain can be a symptom of coronary artery disease (CAD), and cardiac catheterization can show whether plaque is narrowing or blocking your heart's arteries. This procedure is also used to evaluate the valves, as well as measure the blood flow and oxygen levels in different parts of your heart. For further information please visit [email or web address]. Please follow instruction sheet, as given. Continue all current medications. Follow up given at time of discharge.

## 2015-06-22 NOTE — Telephone Encounter (Signed)
Left heart cath - Wednesday, 7/27 - 10:00 - Joseph [name]  Checking percert

## 2015-06-22 NOTE — Progress Notes (Signed)
Cardiology Office Note  Date: 06/22/2015   ID: Joseph, [name] 02/02/1941, MRN [id number]  PCP: Marline Backbone, PA-C  Consulting Cardiologist: Rozann Lesches, MD   Chief Complaint  Patient presents with  . Cardiac evaluation    History of Present Illness: ANTHONNY [name] is a 74 y.o. male referred for cardiology consultation by Dr. Servando Snare who recently evaluated the patient on July 20. I reviewed the available records. 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 This was documented during an admission to [facility] earlier in July for an evaluation of chest pain. Records showed normal troponin I levels at that time as well as an echocardiogram and Myoview which are outlined below. He had what was described as a structurally normal aortic valve with mild aortic regurgitation and preserved LVEF by echocardiography. The Myoview was interpreted as intermediate risk, however showed no evidence of ischemia or infarction and LVEF was 47% with apical hypokinesis.  He is here today with his wife for evaluation. He states that he has had intermittent feelings of chest and epigastric discomfort with activity, recalls having diaphoresis and shortness of breath in early July with his chest pain prompting admission to the hospital. This has been an intermittent problem for at least the last several months. He does not identify these symptoms with all of his activities however, usually when he is overheated or pushing himself.  He states that he and his wife are now "on a diet." He is cutting back caloric intake, has also tried to do some walking indoors when he is at [employer] with his wife. He feels like he has had to rest more when he doses usual walking.   Past Medical History  Diagnosis Date  . GERD (gastroesophageal reflux disease)   . Essential hypertension   . Anxiety   . Insomnia   . Gout   .  History of kidney stones   . Ascending aortic aneurysm     Past Surgical History  Procedure Laterality Date  . Hernia repair    . Kidney stone surgery      Current Outpatient Prescriptions  Medication Sig Dispense Refill  . allopurinol (ZYLOPRIM) 300 MG tablet Take 1 tablet (300 mg total) by mouth daily. 90 tablet 1  . aspirin 81 MG tablet Take 81 mg by mouth daily.    Marland Kitchen atorvastatin (LIPITOR) 40 MG tablet Take 1 tablet (40 mg total) by mouth daily. 90 tablet 3  . benazepril (LOTENSIN) 20 MG tablet Take 1 tablet (20 mg total) by mouth daily. 90 tablet 3  . fish oil-omega-3 fatty acids 1000 MG capsule Take 2 g by mouth daily.    . hydrochlorothiazide (HYDRODIURIL) 25 MG tablet Take 1 tablet (25 mg total) by mouth daily. 90 tablet 3  . Multiple Vitamins-Minerals (COMPLETE MULTIVITAMIN/MINERAL PO) Take 1 tablet by mouth daily.    Marland Kitchen NIFEdipine (PROCARDIA-XL/ADALAT-CC/NIFEDICAL-XL) 30 MG 24 hr tablet Take 1 tablet (30 mg total) by mouth daily. 90 tablet 1  . Potassium 99 MG TABS Take 1 tablet by mouth daily.    . traMADol (ULTRAM) 50 MG tablet Take 1 tablet (50 mg total) by mouth every 8 (eight) hours as needed. (Patient taking differently: Take 50 mg by mouth every 8 (eight) hours as needed. USUALLY TAKES I/2 TAB) 60 tablet 0  . zolpidem (AMBIEN) 10 MG tablet Take 1 tablet (10 mg  total) by mouth at bedtime as needed. for sleep 90 tablet 1   No current facility-administered medications for this visit.    Allergies:  Carafate; Ciprofloxacin hcl; Prevacid; and Sulfa antibiotics   Social History: The patient  reports that he quit smoking about 57 years ago. His smoking use included Cigarettes. He has a .5 pack-year smoking history. He has quit using smokeless tobacco. His smokeless tobacco use included Chew. He reports that he does not drink alcohol or use illicit drugs.   Family History: The patient's family history includes Congestive Heart Failure in his brother; Deep vein thrombosis in his  brother; Diabetes in his sister; Heart disease in his brother and father.   ROS:  Please see the history of present illness. Otherwise, complete review of systems is positive for. Varicose veins with chronic leg edema. Intermittent anxiety and "jitteriness." Leg pain when he stands, possibly related to spinal stenosis or disc disease. All other systems are reviewed and negative.   Physical Exam: VS:  BP 126/66 mmHg  Pulse 98  SpO2 95%, BMI There is no weight on file to calculate BMI.  Wt Readings from Last 3 Encounters:  06/20/15 251 lb (113.853 kg)  06/12/15 258 lb (117.028 kg)  04/18/15 260 lb (117.935 kg)     General: Mildly obese male, appears comfortable at rest. HEENT: Conjunctiva and lids normal, oropharynx clear. Neck: Supple, increased girth without obvious elevated JVP or carotid bruits, no thyromegaly. Lungs: Clear to auscultation, nonlabored breathing at rest. Cardiac: Regular rate and rhythm, no S3 or significant systolic murmur, no pericardial rub. Abdomen: Soft, nontender, protuberant, bowel sounds present. Extremities: 1+ leg edema with venous varicosities, distal pulses 2+. Skin: Warm and dry. Musculoskeletal: No kyphosis. Neuropsychiatric: Alert and oriented x3, affect grossly appropriate.   ECG: ECG is ordered today and shows sinus rhythm with borderline low voltage, decreased R wave progression and nonspecific T-wave changes.   Recent Labwork: 04/18/2015: ALT 18; AST 18; BUN 12; Creatinine, Ser 1.02; Potassium 4.3; Sodium 141     Component Value Date/Time   CHOL 149 04/18/2015 0942   CHOL 208* 03/22/2013 0928   TRIG 142 04/18/2015 0942   TRIG 163* 03/22/2013 0928   HDL 43 04/18/2015 0942   HDL 39* 03/22/2013 0928   LDLCALC 65 07/10/2014 0936   LDLCALC 136* 03/22/2013 0928    Other Studies Reviewed Today:  Echocardiogram 06/05/2015: Findings Mitral Valve Structurally normal mitral valve with good mobility and no significant regurgitation by color flow  doppler examination. Aortic Valve Structurally normal aortic valve with good leaflet mobility, and mild regurgitation by color flow doppler examination. Tricuspid Valve Tricuspid valve is structurally normal. Mild tricuspid regurgitation by color flow doppler examination. Pulmonic Valve Pulmonic valve is structurally normal. No Doppler evidence of pulmonic stenosis or insufficiency. Left Atrium Normal size left atrium. Left Ventricle Mild concentric left ventricular hypertrophy Normal left ventricular systolic function with no appreciable segmental abnormality. Right Atrium Mild right atrial enlargement Right Ventricle Mildly dilated right ventricle. Pericardial Effusion No evidence of pericardial effusion. Miscellaneous Aortic root dimension within normal limits. IVC dilated.  [place] 06/06/2015: FINDINGS: Perfusion: Regional images of the left ventricular myocardium show no fixed defect to suggest infarct or reversible lesion to suggest ischemic change. No abnormality is identified on the SPECT study.  Wall Motion: Left ventricle is rather prominent. There is mild apical hypokinesis. No other wall motion abnormality is identified in the left ventricle.  Left Ventricular Ejection Fraction: 47 %  End diastolic volume 660 ml  End systolic volume 69 ml  IMPRESSION: 1. No reversible ischemia or infarction.  2. There is left ventricular prominence. There is mild apical hypokinesis. No other wall motion abnormalities are identified.  3. Left ventricular ejection fraction 47%  4. Intermediate risk stress test findings*.  Assessment and Plan:  1. Patient referred for cardiac evaluation with history of intermittent chest discomfort, at times associated with diaphoresis and shortness of breath, usually with activities that are more intense than his basic ADLs. Relative decrease in stamina also noted. Extensive records are reviewed. Recent hospitalization in [facility] revealed no evidence of ACS by cardiac enzymes. Noninvasive cardiac testing was somewhat equivocal. Echocardiography indicated normal LVEF, however Myoview suggested LVEF 47% with apical hypokinesis, interpreted as an intermediate risk study although no ischemia described. He has cardiac risk factors including age and gender as well as hypertension, and a concurrent diagnosis of ascending aortic aneurysm. We discussed proceeding to a diagnostic cardiac catheterization to more clearly evaluate his coronary anatomy. After reviewing the risks and benefits, he is in agreement to proceed.  2. Ascending aortic aneurysm, 4.8 cm. This is being followed by Dr. Servando Snare. No associated bicuspid aortic valve by echocardiogram done in [facility].  3. Essential hypertension, blood pressure control is good today.  Current medicines were reviewed with the patient today.   Orders Placed This Encounter  Procedures  . EKG 12-Lead    Disposition: FU with me in 1 month.   Signed, Satira Sark, MD, [facility] 06/22/2015 10:25 AM    [facility] [place], [place], [place] [address] Phone: [phone]; Fax: [phone]

## 2015-06-27 ENCOUNTER — Ambulatory Visit (HOSPITAL_COMMUNITY)
Admission: RE | Admit: 2015-06-27 | Discharge: 2015-06-27 | Disposition: A | Discharge status: Home / Self Care | Payer: Commercial Managed Care - HMO | Source: Ambulatory Visit | Attending: Cardiovascular Disease | Admitting: Cardiovascular Disease

## 2015-06-27 ENCOUNTER — Encounter (HOSPITAL_COMMUNITY)
Admission: RE | Disposition: A | Discharge status: Home / Self Care | Payer: Commercial Managed Care - HMO | Source: Ambulatory Visit | Attending: Cardiovascular Disease

## 2015-06-27 DIAGNOSIS — F419 Anxiety disorder, unspecified: Secondary | ICD-10-CM | POA: Diagnosis not present

## 2015-06-27 DIAGNOSIS — K219 Gastro-esophageal reflux disease without esophagitis: Secondary | ICD-10-CM | POA: Diagnosis not present

## 2015-06-27 DIAGNOSIS — R9439 Abnormal result of other cardiovascular function study: Secondary | ICD-10-CM | POA: Diagnosis present

## 2015-06-27 DIAGNOSIS — Z87442 Personal history of urinary calculi: Secondary | ICD-10-CM | POA: Diagnosis not present

## 2015-06-27 DIAGNOSIS — E669 Obesity, unspecified: Secondary | ICD-10-CM | POA: Insufficient documentation

## 2015-06-27 DIAGNOSIS — G4700 Insomnia, unspecified: Secondary | ICD-10-CM | POA: Insufficient documentation

## 2015-06-27 DIAGNOSIS — I712 Thoracic aortic aneurysm, without rupture: Secondary | ICD-10-CM | POA: Diagnosis not present

## 2015-06-27 DIAGNOSIS — I2510 Atherosclerotic heart disease of native coronary artery without angina pectoris: Secondary | ICD-10-CM | POA: Diagnosis not present

## 2015-06-27 DIAGNOSIS — M109 Gout, unspecified: Secondary | ICD-10-CM | POA: Diagnosis not present

## 2015-06-27 DIAGNOSIS — Z7982 Long term (current) use of aspirin: Secondary | ICD-10-CM | POA: Diagnosis not present

## 2015-06-27 DIAGNOSIS — Z87891 Personal history of nicotine dependence: Secondary | ICD-10-CM | POA: Insufficient documentation

## 2015-06-27 DIAGNOSIS — I10 Essential (primary) hypertension: Secondary | ICD-10-CM | POA: Insufficient documentation

## 2015-06-27 HISTORY — PX: CARDIAC CATHETERIZATION: SHX172

## 2015-06-27 LAB — CBC
HEMATOCRIT: 39.1 % (ref 39.0–52.0)
HEMOGLOBIN: 13.2 g/dL (ref 13.0–17.0)
MCH: 31.8 pg (ref 26.0–34.0)
MCHC: 33.8 g/dL (ref 30.0–36.0)
MCV: 94.2 fL (ref 78.0–100.0)
Platelets: 182 10*3/uL (ref 150–400)
RBC: 4.15 MIL/uL — ABNORMAL LOW (ref 4.22–5.81)
RDW: 14 % (ref 11.5–15.5)
WBC: 8.1 10*3/uL (ref 4.0–10.5)

## 2015-06-27 LAB — BASIC METABOLIC PANEL
Anion gap: 8 (ref 5–15)
BUN: 14 mg/dL (ref 6–20)
CO2: 28 mmol/L (ref 22–32)
Calcium: 9 mg/dL (ref 8.9–10.3)
Chloride: 100 mmol/L — ABNORMAL LOW (ref 101–111)
Creatinine, Ser: 0.96 mg/dL (ref 0.61–1.24)
GFR calc Af Amer: >60 mL/min (ref >60)
GFR calc non Af Amer: >60 mL/min (ref >60)
Glucose, Bld: 103 mg/dL — ABNORMAL HIGH (ref 65–99)
POTASSIUM: 3.4 mmol/L — AB (ref 3.5–5.1)
SODIUM: 136 mmol/L (ref 135–145)

## 2015-06-27 LAB — PROTIME-INR
INR: 1.08 (ref 0.00–1.49)
Prothrombin Time: 14.2 seconds (ref 11.6–15.2)

## 2015-06-27 SURGERY — LEFT HEART CATH AND CORONARY ANGIOGRAPHY
Anesthesia: LOCAL

## 2015-06-27 MED ORDER — VERAPAMIL HCL 2.5 MG/ML IV SOLN
INTRAVENOUS | Status: AC
  Filled 2015-06-27: qty 2

## 2015-06-27 MED ORDER — MIDAZOLAM HCL 2 MG/2ML IJ SOLN
INTRAMUSCULAR | Status: DC | PRN
  Administered 2015-06-27 (×2): 1 mg via INTRAVENOUS

## 2015-06-27 MED ORDER — FENTANYL CITRATE (PF) 100 MCG/2ML IJ SOLN
INTRAMUSCULAR | Status: AC
  Filled 2015-06-27: qty 2

## 2015-06-27 MED ORDER — NITROGLYCERIN 1 MG/10 ML FOR IR/CATH LAB
INTRA_ARTERIAL | Status: DC | PRN
  Administered 2015-06-27: 11:00:00

## 2015-06-27 MED ORDER — MIDAZOLAM HCL 2 MG/2ML IJ SOLN
INTRAMUSCULAR | Status: AC
Start: 2015-06-27 — End: 2015-06-27
  Filled 2015-06-27: qty 2

## 2015-06-27 MED ORDER — SODIUM CHLORIDE 0.9 % IJ SOLN: 3.0000 mL | INTRAMUSCULAR | Status: DC | PRN

## 2015-06-27 MED ORDER — RADIAL COCKTAIL (HEPARIN/VERAPAMIL/LIDOCAINE/NITRO)
Status: DC | PRN
  Administered 2015-06-27: 15 via INTRA_ARTERIAL

## 2015-06-27 MED ORDER — HEPARIN SODIUM (PORCINE) 1000 UNIT/ML IJ SOLN
INTRAMUSCULAR | Status: DC | PRN
  Administered 2015-06-27: 5000 [IU] via INTRAVENOUS

## 2015-06-27 MED ORDER — SODIUM CHLORIDE 0.9 % IV SOLN: 250.0000 mL | INTRAVENOUS | Status: DC | PRN

## 2015-06-27 MED ORDER — LIDOCAINE HCL (PF) 1 % IJ SOLN
INTRAMUSCULAR | Status: DC | PRN
  Administered 2015-06-27: 5 mL

## 2015-06-27 MED ORDER — SODIUM CHLORIDE 0.9 % IV SOLN
INTRAVENOUS | Status: DC
Start: 2015-06-27 — End: 2015-06-27
  Administered 2015-06-27: 09:00:00 via INTRAVENOUS

## 2015-06-27 MED ORDER — SODIUM CHLORIDE 0.9 % IJ SOLN: 3.0000 mL | Freq: Two times a day (BID) | INTRAMUSCULAR | Status: DC

## 2015-06-27 MED ORDER — SODIUM CHLORIDE 0.9 % WEIGHT BASED INFUSION: 3.0000 mL/kg/h | INTRAVENOUS | Status: AC

## 2015-06-27 MED ORDER — ONDANSETRON HCL 4 MG/2ML IJ SOLN: 4.0000 mg | Freq: Four times a day (QID) | INTRAMUSCULAR | Status: DC | PRN

## 2015-06-27 MED ORDER — ASPIRIN 81 MG PO CHEW
81.0000 mg | CHEWABLE_TABLET | ORAL | Status: AC
  Administered 2015-06-27: 81 mg via ORAL

## 2015-06-27 MED ORDER — ACETAMINOPHEN 325 MG PO TABS: 650.0000 mg | ORAL_TABLET | ORAL | Status: DC | PRN

## 2015-06-27 MED ORDER — NITROGLYCERIN 1 MG/10 ML FOR IR/CATH LAB
INTRA_ARTERIAL | Status: AC
  Filled 2015-06-27: qty 10

## 2015-06-27 MED ORDER — SODIUM CHLORIDE 0.9 % IV SOLN: INTRAVENOUS | Status: DC | PRN

## 2015-06-27 MED ORDER — HEPARIN SODIUM (PORCINE) 1000 UNIT/ML IJ SOLN
INTRAMUSCULAR | Status: AC
  Filled 2015-06-27: qty 1

## 2015-06-27 MED ORDER — HEPARIN (PORCINE) IN NACL 2-0.9 UNIT/ML-% IJ SOLN
INTRAMUSCULAR | Status: AC
  Filled 2015-06-27: qty 1000

## 2015-06-27 MED ORDER — ASPIRIN 81 MG PO CHEW
CHEWABLE_TABLET | ORAL | Status: AC
  Administered 2015-06-27: 81 mg via ORAL
  Filled 2015-06-27: qty 1

## 2015-06-27 MED ORDER — ASPIRIN EC 81 MG PO TBEC
81.0000 mg | DELAYED_RELEASE_TABLET | Freq: Every day | ORAL | Status: DC
  Filled 2015-06-27: qty 1

## 2015-06-27 MED ORDER — FENTANYL CITRATE (PF) 100 MCG/2ML IJ SOLN
INTRAMUSCULAR | Status: DC | PRN
  Administered 2015-06-27 (×2): 25 ug via INTRAVENOUS

## 2015-06-27 MED ORDER — LIDOCAINE HCL (PF) 1 % IJ SOLN
INTRAMUSCULAR | Status: AC
  Filled 2015-06-27: qty 30

## 2015-06-27 SURGICAL SUPPLY — 12 items
CATH INFINITI 5FR ANG PIGTAIL (CATHETERS) ×3 IMPLANT
CATH INFINITI JR4 5F (CATHETERS) ×3 IMPLANT
CATH OPTITORQUE TIG 4.0 5F (CATHETERS) ×3 IMPLANT
DEVICE RAD COMP TR BAND LRG (VASCULAR PRODUCTS) ×3 IMPLANT
GLIDESHEATH SLEND A-KIT 6F 22G (SHEATH) ×3 IMPLANT
GLIDESHEATH SLEND SS 6F .021 (SHEATH) ×3 IMPLANT
KIT HEART LEFT (KITS) ×3 IMPLANT
PACK CARDIAC CATHETERIZATION (CUSTOM PROCEDURE TRAY) ×3 IMPLANT
SYR MEDRAD MARK V 150ML (SYRINGE) ×3 IMPLANT
TRANSDUCER W/STOPCOCK (MISCELLANEOUS) ×3 IMPLANT
TUBING CIL FLEX 10 FLL-RA (TUBING) ×3 IMPLANT
WIRE SAFE-T 1.5MM-J .035X260CM (WIRE) ×3 IMPLANT

## 2015-06-27 NOTE — Interval H&P Note (Signed)
Cath Lab Visit (complete for each Cath Lab visit)  Clinical Evaluation Leading to the Procedure:   ACS: No.  Non-ACS:    Anginal Classification: CCS III  Anti-ischemic medical therapy: Minimal Therapy (1 class of medications)  Non-Invasive Test Results: Intermediate-risk stress test findings: cardiac mortality 1-3%/year  Prior CABG: No previous CABG      History and Physical Interval Note:  06/27/2015 11:13 AM  Joseph [name]  has presented today for surgery, with the diagnosis of abnormal myoview, angina  The various methods of treatment have been discussed with the patient and family. After consideration of risks, benefits and other options for treatment, the patient has consented to  Procedure(s): Left Heart Cath and Coronary Angiography (N/A) as a surgical intervention .  The patient's history has been reviewed, patient examined, no change in status, stable for surgery.  I have reviewed the patient's chart and labs.  Questions were answered to the patient's satisfaction.     Kol Consuegra A

## 2015-06-27 NOTE — Research (Signed)
CADLAD Informed Consent   Subject Name: Joseph [name]  Subject met inclusion and exclusion criteria.  The informed consent form, study requirements and expectations were reviewed with the subject and questions and concerns were addressed prior to the signing of the consent form.  The subject verbalized understanding of the trail requirements.  The subject agreed to participate in the CADLAD trial and signed the informed consent.  The informed consent was obtained prior to performance of any protocol-specific procedures for the subject.  A copy of the signed informed consent was given to the subject and a copy was placed in the subject's medical record.  Hedrick,Tammy W 06/27/2015, 0830

## 2015-06-27 NOTE — Discharge Instructions (Signed)
Radial Site Care °Refer to this sheet in the next few weeks. These instructions provide you with information on caring for yourself after your procedure. Your caregiver may also give you more specific instructions. Your treatment has been planned according to current medical practices, but problems sometimes occur. Call your caregiver if you have any problems or questions after your procedure. °HOME CARE INSTRUCTIONS °· You may shower the day after the procedure. Remove the bandage (dressing) and gently wash the site with plain soap and water. Gently pat the site dry. °· Do not apply powder or lotion to the site. °· Do not submerge the affected site in water for 3 to 5 days. °· Inspect the site at least twice daily. °· Do not flex or bend the affected arm for 24 hours. °· No lifting over 5 pounds (2.3 kg) for 5 days after your procedure. °· Do not drive home if you are discharged the same day of the procedure. Have someone else drive you. °· You may drive 24 hours after the procedure unless otherwise instructed by your caregiver. °· Do not operate machinery or power tools for 24 hours. °· A responsible adult should be with you for the first 24 hours after you arrive home. °What to expect: °· Any bruising will usually fade within 1 to 2 weeks. °· Blood that collects in the tissue (hematoma) may be painful to the touch. It should usually decrease in size and tenderness within 1 to 2 weeks. °SEEK IMMEDIATE MEDICAL CARE IF: °· You have unusual pain at the radial site. °· You have redness, warmth, swelling, or pain at the radial site. °· You have drainage (other than a small amount of blood on the dressing). °· You have chills. °· You have a fever or persistent symptoms for more than 72 hours. °· You have a fever and your symptoms suddenly get worse. °· Your arm becomes pale, cool, tingly, or numb. °· You have heavy bleeding from the site. Hold pressure on the site. °Document Released: 12/20/2010 Document Revised:  02/09/2012 Document Reviewed: 12/20/2010 °[employer]® Patient Information ©2015 [employer]. This information is not intended to replace advice given to you by your health care provider. Make sure you discuss any questions you have with your health care provider. ° °

## 2015-06-27 NOTE — H&P (View-Only) (Signed)
Cardiology Office Note  Date: 06/22/2015   ID: Joseph [name], Joseph [name] 10-15-1941, MRN [id number]  PCP: Marline Backbone, PA-C  Consulting Cardiologist: Rozann Lesches, MD   Chief Complaint  Patient presents with  . Cardiac evaluation    History of Present Illness: Joseph [name] is a 74 y.o. male referred for cardiology consultation by Dr. Servando Snare who recently evaluated the patient on July 20. I reviewed the available records. 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 This was documented during an admission to [facility] earlier in July for an evaluation of chest pain. Records showed normal troponin I levels at that time as well as an echocardiogram and Myoview which are outlined below. He had what was described as a structurally normal aortic valve with mild aortic regurgitation and preserved LVEF by echocardiography. The Myoview was interpreted as intermediate risk, however showed no evidence of ischemia or infarction and LVEF was 47% with apical hypokinesis.  He is here today with his wife for evaluation. He states that he has had intermittent feelings of chest and epigastric discomfort with activity, recalls having diaphoresis and shortness of breath in early July with his chest pain prompting admission to the hospital. This has been an intermittent problem for at least the last several months. He does not identify these symptoms with all of his activities however, usually when he is overheated or pushing himself.  He states that he and his wife are now "on a diet." He is cutting back caloric intake, has also tried to do some walking indoors when he is at [place] with his wife. He feels like he has had to rest more when he doses usual walking.   Past Medical History  Diagnosis Date  . GERD (gastroesophageal reflux disease)   . Essential hypertension   . Anxiety   . Insomnia   . Gout   .  History of kidney stones   . Ascending aortic aneurysm     Past Surgical History  Procedure Laterality Date  . Hernia repair    . Kidney stone surgery      Current Outpatient Prescriptions  Medication Sig Dispense Refill  . allopurinol (ZYLOPRIM) 300 MG tablet Take 1 tablet (300 mg total) by mouth daily. 90 tablet 1  . aspirin 81 MG tablet Take 81 mg by mouth daily.    Marland Kitchen atorvastatin (LIPITOR) 40 MG tablet Take 1 tablet (40 mg total) by mouth daily. 90 tablet 3  . benazepril (LOTENSIN) 20 MG tablet Take 1 tablet (20 mg total) by mouth daily. 90 tablet 3  . fish oil-omega-3 fatty acids 1000 MG capsule Take 2 g by mouth daily.    . hydrochlorothiazide (HYDRODIURIL) 25 MG tablet Take 1 tablet (25 mg total) by mouth daily. 90 tablet 3  . Multiple Vitamins-Minerals (COMPLETE MULTIVITAMIN/MINERAL PO) Take 1 tablet by mouth daily.    Marland Kitchen NIFEdipine (PROCARDIA-XL/ADALAT-CC/NIFEDICAL-XL) 30 MG 24 hr tablet Take 1 tablet (30 mg total) by mouth daily. 90 tablet 1  . Potassium 99 MG TABS Take 1 tablet by mouth daily.    . traMADol (ULTRAM) 50 MG tablet Take 1 tablet (50 mg total) by mouth every 8 (eight) hours as needed. (Patient taking differently: Take 50 mg by mouth every 8 (eight) hours as needed. USUALLY TAKES I/2 TAB) 60 tablet 0  . zolpidem (AMBIEN) 10 MG tablet Take 1 tablet (10 mg  total) by mouth at bedtime as needed. for sleep 90 tablet 1   No current facility-administered medications for this visit.    Allergies:  Carafate; Ciprofloxacin hcl; Prevacid; and Sulfa antibiotics   Social History: The patient  reports that he quit smoking about 57 years ago. His smoking use included Cigarettes. He has a .5 pack-year smoking history. He has quit using smokeless tobacco. His smokeless tobacco use included Chew. He reports that he does not drink alcohol or use illicit drugs.   Family History: The patient's family history includes Congestive Heart Failure in his brother; Deep vein thrombosis in his  brother; Diabetes in his sister; Heart disease in his brother and father.   ROS:  Please see the history of present illness. Otherwise, complete review of systems is positive for. Varicose veins with chronic leg edema. Intermittent anxiety and "jitteriness." Leg pain when he stands, possibly related to spinal stenosis or disc disease. All other systems are reviewed and negative.   Physical Exam: VS:  BP 126/66 mmHg  Pulse 98  SpO2 95%, BMI There is no weight on file to calculate BMI.  Wt Readings from Last 3 Encounters:  06/20/15 251 lb (113.853 kg)  06/12/15 258 lb (117.028 kg)  04/18/15 260 lb (117.935 kg)     General: Mildly obese male, appears comfortable at rest. HEENT: Conjunctiva and lids normal, oropharynx clear. Neck: Supple, increased girth without obvious elevated JVP or carotid bruits, no thyromegaly. Lungs: Clear to auscultation, nonlabored breathing at rest. Cardiac: Regular rate and rhythm, no S3 or significant systolic murmur, no pericardial rub. Abdomen: Soft, nontender, protuberant, bowel sounds present. Extremities: 1+ leg edema with venous varicosities, distal pulses 2+. Skin: Warm and dry. Musculoskeletal: No kyphosis. Neuropsychiatric: Alert and oriented x3, affect grossly appropriate.   ECG: ECG is ordered today and shows sinus rhythm with borderline low voltage, decreased R wave progression and nonspecific T-wave changes.   Recent Labwork: 04/18/2015: ALT 18; AST 18; BUN 12; Creatinine, Ser 1.02; Potassium 4.3; Sodium 141     Component Value Date/Time   CHOL 149 04/18/2015 0942   CHOL 208* 03/22/2013 0928   TRIG 142 04/18/2015 0942   TRIG 163* 03/22/2013 0928   HDL 43 04/18/2015 0942   HDL 39* 03/22/2013 0928   LDLCALC 65 07/10/2014 0936   LDLCALC 136* 03/22/2013 0928    Other Studies Reviewed Today:  Echocardiogram 06/05/2015: Findings Mitral Valve Structurally normal mitral valve with good mobility and no significant regurgitation by color flow  doppler examination. Aortic Valve Structurally normal aortic valve with good leaflet mobility, and mild regurgitation by color flow doppler examination. Tricuspid Valve Tricuspid valve is structurally normal. Mild tricuspid regurgitation by color flow doppler examination. Pulmonic Valve Pulmonic valve is structurally normal. No Doppler evidence of pulmonic stenosis or insufficiency. Left Atrium Normal size left atrium. Left Ventricle Mild concentric left ventricular hypertrophy Normal left ventricular systolic function with no appreciable segmental abnormality. Right Atrium Mild right atrial enlargement Right Ventricle Mildly dilated right ventricle. Pericardial Effusion No evidence of pericardial effusion. Miscellaneous Aortic root dimension within normal limits. IVC dilated.  [place] 06/06/2015: FINDINGS: Perfusion: Regional images of the left ventricular myocardium show no fixed defect to suggest infarct or reversible lesion to suggest ischemic change. No abnormality is identified on the SPECT study.  Wall Motion: Left ventricle is rather prominent. There is mild apical hypokinesis. No other wall motion abnormality is identified in the left ventricle.  Left Ventricular Ejection Fraction: 47 %  End diastolic volume 798 ml  End systolic volume 69 ml  IMPRESSION: 1. No reversible ischemia or infarction.  2. There is left ventricular prominence. There is mild apical hypokinesis. No other wall motion abnormalities are identified.  3. Left ventricular ejection fraction 47%  4. Intermediate risk stress test findings*.  Assessment and Plan:  1. Patient referred for cardiac evaluation with history of intermittent chest discomfort, at times associated with diaphoresis and shortness of breath, usually with activities that are more intense than his basic ADLs. Relative decrease in stamina also noted. Extensive records are reviewed. Recent hospitalization in [facility] revealed no evidence of ACS by cardiac enzymes. Noninvasive cardiac testing was somewhat equivocal. Echocardiography indicated normal LVEF, however Myoview suggested LVEF 47% with apical hypokinesis, interpreted as an intermediate risk study although no ischemia described. He has cardiac risk factors including age and gender as well as hypertension, and a concurrent diagnosis of ascending aortic aneurysm. We discussed proceeding to a diagnostic cardiac catheterization to more clearly evaluate his coronary anatomy. After reviewing the risks and benefits, he is in agreement to proceed.  2. Ascending aortic aneurysm, 4.8 cm. This is being followed by Dr. Servando Snare. No associated bicuspid aortic valve by echocardiogram done in [facility].  3. Essential hypertension, blood pressure control is good today.  Current medicines were reviewed with the patient today.   Orders Placed This Encounter  Procedures  . EKG 12-Lead    Disposition: FU with me in 1 month.   Signed, Satira Sark, MD, [facility] 06/22/2015 10:25 AM    [facility] [place], [place],  [address] Phone: [phone]; Fax: [phone]

## 2015-06-28 ENCOUNTER — Encounter (HOSPITAL_COMMUNITY): Payer: Self-pay | Admitting: Cardiovascular Disease

## 2015-06-28 MED FILL — Verapamil HCl IV Soln 2.5 MG/ML: INTRAVENOUS | Qty: 2 | Status: AC

## 2015-06-29 ENCOUNTER — Telehealth: Payer: Self-pay | Admitting: *Deleted

## 2015-06-29 NOTE — Telephone Encounter (Signed)
Pt wife called office asking about medication after cath d/c not being called in. Did not see in H&P or D/C summary where medication was changed and wife didn't know. Told pt would confirm meds with provider. Will forward to Dr. Claiborne Billings

## 2015-07-02 MED ORDER — METOPROLOL TARTRATE 25 MG PO TABS: 12.5000 mg | ORAL_TABLET | Freq: Two times a day (BID) | ORAL | Status: DC

## 2015-07-02 NOTE — Telephone Encounter (Signed)
Medication sent to pharmacy. F/u with Dr. Domenic Polite can schedule 8/30 but this would be 5 weeks post cath. Will forward to Dr. Domenic Polite if appt OK or move to sooner appt in [facility]

## 2015-07-02 NOTE — Telephone Encounter (Signed)
That should be fine. He had only mild atherosclerosis at heart catheterization.

## 2015-07-02 NOTE — Telephone Encounter (Signed)
Can try adding low dose metoprolol 12.5 mg bid and f/u with Dr. Domenic Polite

## 2015-07-12 ENCOUNTER — Ambulatory Visit (INDEPENDENT_AMBULATORY_CARE_PROVIDER_SITE_OTHER): Payer: Commercial Managed Care - HMO | Admitting: Family Medicine

## 2015-07-12 VITALS — BP 114/69 | HR 82 | Temp 97.0°F | Ht 67.0 in | Wt 258.0 lb

## 2015-07-12 DIAGNOSIS — F411 Generalized anxiety disorder: Secondary | ICD-10-CM | POA: Diagnosis not present

## 2015-07-12 DIAGNOSIS — I10 Essential (primary) hypertension: Secondary | ICD-10-CM | POA: Diagnosis not present

## 2015-07-12 DIAGNOSIS — I712 Thoracic aortic aneurysm, without rupture: Secondary | ICD-10-CM

## 2015-07-12 DIAGNOSIS — I7121 Aneurysm of the ascending aorta, without rupture: Secondary | ICD-10-CM

## 2015-07-12 MED ORDER — CLONAZEPAM 0.5 MG PO TABS: 0.5000 mg | ORAL_TABLET | Freq: Two times a day (BID) | ORAL | Status: DC | PRN

## 2015-07-12 NOTE — Progress Notes (Signed)
Subjective:  Patient ID: Joseph [name], male    DOB: 1941-06-05  Age: 73 y.o. MRN: [id number]  CC: Anxiety   HPI Joseph [name] presents for patient went to the cardiologist and was told that he had an aneurysm of the ascending aorta. It is by his history 4.7 cm. He was told that they did not want to do anything with it. He is concerned about his chest pain intermittently being a sign that he is going to rupture his aneurysm patient has had some intermittent chest pain evaluated by his cardiologist.  History Joseph [name] has a past medical history of GERD (gastroesophageal reflux disease); Essential hypertension; Anxiety; Insomnia; Gout; History of kidney stones; and Ascending aortic aneurysm.   He has past surgical history that includes Hernia repair; Kidney stone surgery; and Cardiac catheterization (N/A, 06/27/2015).   His family history includes Congestive Heart Failure in his brother; Deep vein thrombosis in his brother; Diabetes in his sister; Heart disease in his brother and father.He reports that he quit smoking about 57 years ago. His smoking use included Cigarettes. He has a .5 pack-year smoking history. He has quit using smokeless tobacco. His smokeless tobacco use included Chew. He reports that he does not drink alcohol or use illicit drugs.  Outpatient Prescriptions Prior to Visit  Medication Sig Dispense Refill  . allopurinol (ZYLOPRIM) 300 MG tablet Take 1 tablet (300 mg total) by mouth daily. 90 tablet 1  . aspirin 81 MG tablet Take 81 mg by mouth daily.    Marland Kitchen atorvastatin (LIPITOR) 40 MG tablet Take 1 tablet (40 mg total) by mouth daily. 90 tablet 3  . benazepril (LOTENSIN) 20 MG tablet Take 1 tablet (20 mg total) by mouth daily. 90 tablet 3  . docusate sodium (COLACE) 100 MG capsule Take 100 mg by mouth daily as needed for mild constipation.    . fish oil-omega-3 fatty acids 1000 MG capsule Take 2 g by mouth daily.    . hydrochlorothiazide (HYDRODIURIL) 25 MG tablet Take 1 tablet (25  mg total) by mouth daily. 90 tablet 3  . metoprolol tartrate (LOPRESSOR) 25 MG tablet Take 0.5 tablets (12.5 mg total) by mouth 2 (two) times daily. 90 tablet 3  . Multiple Vitamins-Minerals (COMPLETE MULTIVITAMIN/MINERAL PO) Take 1 tablet by mouth daily.    Marland Kitchen NIFEdipine (PROCARDIA-XL/ADALAT-CC/NIFEDICAL-XL) 30 MG 24 hr tablet Take 1 tablet (30 mg total) by mouth daily. 90 tablet 1  . Potassium 99 MG TABS Take 1 tablet by mouth daily.    . traMADol (ULTRAM) 50 MG tablet Take 1 tablet (50 mg total) by mouth every 8 (eight) hours as needed. (Patient taking differently: Take 50 mg by mouth every 8 (eight) hours as needed for moderate pain. USUALLY TAKES I/2 TAB) 60 tablet 0  . zolpidem (AMBIEN) 10 MG tablet Take 1 tablet (10 mg total) by mouth at bedtime as needed. for sleep 90 tablet 1   No facility-administered medications prior to visit.    ROS Review of Systems  Constitutional: Negative for fever, chills and diaphoresis.  HENT: Negative for congestion, rhinorrhea and sore throat.   Respiratory: Negative for cough, shortness of breath and wheezing.   Cardiovascular: Positive for chest pain. Negative for palpitations.  Gastrointestinal: Negative for nausea, vomiting, abdominal pain, diarrhea, constipation and abdominal distention.  Genitourinary: Negative for dysuria and frequency.  Musculoskeletal: Negative for joint swelling and arthralgias.  Skin: Negative for rash.  Neurological: Negative for headaches.  Psychiatric/Behavioral: Positive for dysphoric mood and agitation. Negative for  behavioral problems and self-injury. The patient is nervous/anxious.     Objective:  BP 114/69 mmHg  Pulse 82  Temp(Src) 97 F (36.1 C) (Oral)  Ht 5\' 7"  (1.702 m)  Wt 258 lb (117.028 kg)  BMI 40.40 kg/m2  BP Readings from Last 3 Encounters:  07/12/15 114/69  06/27/15 123/61  06/22/15 126/66    Wt Readings from Last 3 Encounters:  07/12/15 258 lb (117.028 kg)  06/27/15 251 lb (113.853 kg)    06/20/15 251 lb (113.853 kg)     Physical Exam  Constitutional: He is oriented to person, place, and time. He appears well-developed and well-nourished. No distress.  HENT:  Head: Normocephalic and atraumatic.  Right Ear: External ear normal.  Left Ear: External ear normal.  Nose: Nose normal.  Mouth/Throat: Oropharynx is clear and moist.  Eyes: Conjunctivae and EOM are normal. Pupils are equal, round, and reactive to light.  Neck: Normal range of motion. Neck supple. No thyromegaly present.  Cardiovascular: Normal rate, regular rhythm and normal heart sounds.   No murmur heard. Pulmonary/Chest: Effort normal and breath sounds normal. No respiratory distress. He has no wheezes. He has no rales.  Abdominal: Soft. Bowel sounds are normal. He exhibits no distension. There is no tenderness.  Lymphadenopathy:    He has no cervical adenopathy.  Neurological: He is alert and oriented to person, place, and time. He has normal reflexes.  Skin: Skin is warm and dry.  Psychiatric: He has a normal mood and affect. His behavior is normal. Judgment and thought content normal.    No results found for: HGBA1C  Lab Results  Component Value Date   WBC 8.1 06/27/2015   HGB 13.2 06/27/2015   HCT 39.1 06/27/2015   PLT 182 06/27/2015   GLUCOSE 103* 06/27/2015   CHOL 149 04/18/2015   TRIG 142 04/18/2015   HDL 43 04/18/2015   LDLCALC 65 07/10/2014   ALT 18 04/18/2015   AST 18 04/18/2015   NA 136 06/27/2015   K 3.4* 06/27/2015   CL 100* 06/27/2015   CREATININE 0.96 06/27/2015   BUN 14 06/27/2015   CO2 28 06/27/2015   PSA 3.3 01/08/2015   INR 1.08 06/27/2015    No results found.  Assessment & Plan:   Joseph [name] was seen today for anxiety.  Diagnoses and all orders for this visit:  Ascending aortic aneurysm  Essential hypertension, benign  Generalized anxiety disorder  Other orders -     clonazePAM (KLONOPIN) 0.5 MG tablet; Take 1 tablet (0.5 mg total) by mouth 2 (two) times daily  as needed for anxiety.  I am having Joseph [name] start on clonazePAM. I am also having him maintain his fish oil-omega-3 fatty acids, hydrochlorothiazide, benazepril, zolpidem, aspirin, allopurinol, atorvastatin, NIFEdipine, traMADol, Multiple Vitamins-Minerals (COMPLETE MULTIVITAMIN/MINERAL PO), Potassium, docusate sodium, and metoprolol tartrate.  Meds ordered this encounter  Medications  . clonazePAM (KLONOPIN) 0.5 MG tablet    Sig: Take 1 tablet (0.5 mg total) by mouth 2 (two) times daily as needed for anxiety.    Dispense:  60 tablet    Refill:  0   30 minutes were spent with the patient more than half of which was in counseling regarding his condition. Specifically we discussed the aneurysm repair as being deferred at the recommendation of cardiology due to the risk level of the aneurysm being low. The patient did not initially understand why the repair was not recommended. He did not understand that the size of the aneurysm determined risk and therefore  whether it was necessary to have surgery. He was reassured that it was below the size generally considered for surgery. That indicated that his safety level was good. Patient voiced understanding and reassurance.  Follow-up: Return in about 1 month (around 08/12/2015), or if symptoms worsen or fail to improve.  Claretta Fraise, M.D.

## 2015-07-27 ENCOUNTER — Ambulatory Visit (INDEPENDENT_AMBULATORY_CARE_PROVIDER_SITE_OTHER): Payer: Commercial Managed Care - HMO | Admitting: Nurse Practitioner

## 2015-07-27 ENCOUNTER — Encounter: Payer: Self-pay | Admitting: Nurse Practitioner

## 2015-07-27 VITALS — BP 127/81 | HR 93 | Temp 98.2°F | Ht 67.0 in | Wt 247.6 lb

## 2015-07-27 DIAGNOSIS — I7121 Aneurysm of the ascending aorta, without rupture: Secondary | ICD-10-CM | POA: Insufficient documentation

## 2015-07-27 DIAGNOSIS — G4700 Insomnia, unspecified: Secondary | ICD-10-CM

## 2015-07-27 DIAGNOSIS — I712 Thoracic aortic aneurysm, without rupture: Secondary | ICD-10-CM | POA: Insufficient documentation

## 2015-07-27 DIAGNOSIS — M1A09X0 Idiopathic chronic gout, multiple sites, without tophus (tophi): Secondary | ICD-10-CM

## 2015-07-27 DIAGNOSIS — I10 Essential (primary) hypertension: Secondary | ICD-10-CM | POA: Diagnosis not present

## 2015-07-27 DIAGNOSIS — I8393 Asymptomatic varicose veins of bilateral lower extremities: Secondary | ICD-10-CM

## 2015-07-27 DIAGNOSIS — I7300 Raynaud's syndrome without gangrene: Secondary | ICD-10-CM | POA: Diagnosis not present

## 2015-07-27 DIAGNOSIS — N400 Enlarged prostate without lower urinary tract symptoms: Secondary | ICD-10-CM

## 2015-07-27 DIAGNOSIS — E785 Hyperlipidemia, unspecified: Secondary | ICD-10-CM

## 2015-07-27 NOTE — Patient Instructions (Signed)
Stress and Stress Management Stress is a normal reaction to life events. It is what you feel when life demands more than you are used to or more than you can handle. Some stress can be useful. For example, the stress reaction can help you catch the last bus of the day, study for a test, or meet a deadline at work. But stress that occurs too often or for too long can cause problems. It can affect your emotional health and interfere with relationships and normal daily activities. Too much stress can weaken your immune system and increase your risk for physical illness. If you already have a medical problem, stress can make it worse. CAUSES  All sorts of life events may cause stress. An event that causes stress for one person may not be stressful for another person. Major life events commonly cause stress. These may be positive or negative. Examples include losing your job, moving into a new home, getting married, having a baby, or losing a loved one. Less obvious life events may also cause stress, especially if they occur day after day or in combination. Examples include working long hours, driving in traffic, caring for children, being in debt, or being in a difficult relationship. SIGNS AND SYMPTOMS Stress may cause emotional symptoms including, the following:  Anxiety. This is feeling worried, afraid, on edge, overwhelmed, or out of control.  Anger. This is feeling irritated or impatient.  Depression. This is feeling sad, down, helpless, or guilty.  Difficulty focusing, remembering, or making decisions. Stress may cause physical symptoms, including the following:   Aches and pains. These may affect your head, neck, back, stomach, or other areas of your body.  Tight muscles or clenched jaw.  Low energy or trouble sleeping. Stress may cause unhealthy behaviors, including the following:   Eating to feel better (overeating) or skipping meals.  Sleeping too little, too much, or both.  Working  too much or putting off tasks (procrastination).  Smoking, drinking alcohol, or using drugs to feel better. DIAGNOSIS  Stress is diagnosed through an assessment by your health care provider. Your health care provider will ask questions about your symptoms and any stressful life events.Your health care provider will also ask about your medical history and may order blood tests or other tests. Certain medical conditions and medicine can cause physical symptoms similar to stress. Mental illness can cause emotional symptoms and unhealthy behaviors similar to stress. Your health care provider may refer you to a mental health professional for further evaluation.  TREATMENT  Stress management is the recommended treatment for stress.The goals of stress management are reducing stressful life events and coping with stress in healthy ways.  Techniques for reducing stressful life events include the following:  Stress identification. Self-monitor for stress and identify what causes stress for you. These skills may help you to avoid some stressful events.  Time management. Set your priorities, keep a calendar of events, and learn to say "no." These tools can help you avoid making too many commitments. Techniques for coping with stress include the following:  Rethinking the problem. Try to think realistically about stressful events rather than ignoring them or overreacting. Try to find the positives in a stressful situation rather than focusing on the negatives.  Exercise. Physical exercise can release both physical and emotional tension. The key is to find a form of exercise you enjoy and do it regularly.  Relaxation techniques. These relax the body and mind. Examples include yoga, meditation, tai chi, biofeedback, deep  breathing, progressive muscle relaxation, listening to music, being out in nature, journaling, and other hobbies. Again, the key is to find one or more that you enjoy and can do  regularly.  Healthy lifestyle. Eat a balanced diet, get plenty of sleep, and do not smoke. Avoid using alcohol or drugs to relax.  Strong support network. Spend time with family, friends, or other people you enjoy being around.Express your feelings and talk things over with someone you trust. Counseling or talktherapy with a mental health professional may be helpful if you are having difficulty managing stress on your own. Medicine is typically not recommended for the treatment of stress.Talk to your health care provider if you think you need medicine for symptoms of stress. HOME CARE INSTRUCTIONS  Keep all follow-up visits as directed by your health care provider.  Take all medicines as directed by your health care provider. SEEK MEDICAL CARE IF:  Your symptoms get worse or you start having new symptoms.  You feel overwhelmed by your problems and can no longer manage them on your own. SEEK IMMEDIATE MEDICAL CARE IF:  You feel like hurting yourself or someone else. Document Released: 05/13/2001 Document Revised: 04/03/2014 Document Reviewed: 07/12/2013 [employer] Patient Information 2015 [employer]. This information is not intended to replace advice given to you by your health care provider. Make sure you discuss any questions you have with your health care provider.  

## 2015-07-27 NOTE — Progress Notes (Signed)
Subjective:    Patient ID: Joseph [name], male    DOB: 11-25-1941, 74 y.o.   MRN: [id number]  Patient here today for follow up of chronic medical problems. No acute complaints .  * was camping July 4 weekend and had an episode with his heart and had to go to hospital- was found to have a thoracic aortic aneurysm- not a candidate for surgery right now. Was put on metoprolol 25 mg 1/2 BID by cardiologits Dr. Duard Brady- doing okay right now  Hyperlipidemia This is a chronic problem. The current episode started more than 1 year ago. The problem is controlled. Recent lipid tests were reviewed and are normal. He has no history of diabetes, hypothyroidism or obesity. There are no known factors aggravating his hyperlipidemia. Pertinent negatives include no chest pain or shortness of breath. Current antihyperlipidemic treatment includes statins. The current treatment provides moderate improvement of lipids. Compliance problems include adherence to diet and adherence to exercise.  Risk factors for coronary artery disease include dyslipidemia, hypertension and obesity.  Hypertension This is a chronic problem. The current episode started more than 1 year ago. The problem is unchanged. The problem is controlled. Pertinent negatives include no chest pain, headaches, palpitations or shortness of breath. Risk factors for coronary artery disease include dyslipidemia and male gender. Past treatments include diuretics and ACE inhibitors. The current treatment provides significant improvement. Compliance problems include diet and exercise.   Insomnia Ambien- works well - can't sleep without it BPH Pt voiding without difficulty. He stopped seeing Carmelina Noun.  GOUT  Usually in his feet- he has not have an attack for some time. Taking allopurinol 316m.   Review of Systems  Constitutional: Negative.   HENT: Negative.   Respiratory: Negative.  Negative for shortness of breath.   Cardiovascular: Negative.  Negative for chest  pain, palpitations and leg swelling.  Genitourinary: Negative.   Musculoskeletal: Negative.   Skin: Negative.   Neurological: Negative for headaches.  Hematological: Negative.   Psychiatric/Behavioral: Negative.   All other systems reviewed and are negative.      Objective:   Physical Exam  Constitutional: He is oriented to person, place, and time. He appears well-developed and well-nourished.  HENT:  Head: Normocephalic.  Right Ear: External ear normal.  Left Ear: External ear normal.  Nose: Nose normal.  Mouth/Throat: Oropharynx is clear and moist.  Eyes: EOM are normal. Pupils are equal, round, and reactive to light.  Neck: Normal range of motion. Neck supple. No JVD present. No thyromegaly present.  Cardiovascular: Normal rate, regular rhythm, normal heart sounds and intact distal pulses.  Exam reveals no gallop and no friction rub.   No murmur heard. (+) allen test bil hands  Pulmonary/Chest: Effort normal and breath sounds normal. No respiratory distress. He has no wheezes. He has no rales. He exhibits no tenderness.  Abdominal: Soft. Bowel sounds are normal. He exhibits no mass. There is no tenderness.  Musculoskeletal: Normal range of motion. He exhibits no edema.  Lymphadenopathy:    He has no cervical adenopathy.  Neurological: He is alert and oriented to person, place, and time. No cranial nerve deficit.  Skin: Skin is warm and dry.  Psychiatric: He has a normal mood and affect. His behavior is normal. Judgment and thought content normal.    BP 127/81 mmHg  Pulse 93  Temp(Src) 98.2 F (36.8 C) (Oral)  Ht 5' 7" (1.702 m)  Wt 247 lb 9.6 oz (112.311 kg)  BMI 38.77 kg/m2  Assessment & Plan:   1. Essential hypertension, benign Do not add salt to diet - CMP14+EGFR  2. Raynauds disease  3. BPH (benign prostatic hyperplasia)  4. Hyperlipidemia with target LDL less than 100 Low fat diet - Lipid panel  5. Insomnia Bedtime ritual  6. Idiopathic  chronic gout of multiple sites without tophus  7. Severe obesity (BMI >= 40) Discussed diet and exercise for person with BMI >25 Will recheck weight in 3-6 months   8. Thoracic ascending aortic aneurysm Stress management  9. Varicose veins of both lower extremities    Labs pending Health maintenance reviewed Diet and exercise encouraged Continue all meds Follow up  In 3 months   Mary-Margaret Martin, FNP    

## 2015-07-28 LAB — CMP14+EGFR
ALT: 19 IU/L (ref 0–44)
AST: 20 IU/L (ref 0–40)
Albumin/Globulin Ratio: 1.7 (ref 1.1–2.5)
Albumin: 4.3 g/dL (ref 3.5–4.8)
Alkaline Phosphatase: 60 IU/L (ref 39–117)
BUN/Creatinine Ratio: 12 (ref 10–22)
BUN: 11 mg/dL (ref 8–27)
Bilirubin Total: 0.6 mg/dL (ref 0.0–1.2)
CHLORIDE: 96 mmol/L — AB (ref 97–108)
CO2: 21 mmol/L (ref 18–29)
CREATININE: 0.93 mg/dL (ref 0.76–1.27)
Calcium: 9.9 mg/dL (ref 8.6–10.2)
GFR, EST AFRICAN AMERICAN: 94 mL/min/{1.73_m2} (ref >59)
GFR, EST NON AFRICAN AMERICAN: 82 mL/min/{1.73_m2} (ref >59)
GLUCOSE: 106 mg/dL — AB (ref 65–99)
Globulin, Total: 2.5 g/dL (ref 1.5–4.5)
Potassium: 3.9 mmol/L (ref 3.5–5.2)
Sodium: 138 mmol/L (ref 134–144)
Total Protein: 6.8 g/dL (ref 6.0–8.5)

## 2015-07-28 LAB — LIPID PANEL
CHOL/HDL RATIO: 2.6 ratio (ref 0.0–5.0)
Cholesterol, Total: 121 mg/dL (ref 100–199)
HDL: 46 mg/dL (ref >39)
LDL CALC: 54 mg/dL (ref 0–99)
Triglycerides: 106 mg/dL (ref 0–149)
VLDL CHOLESTEROL CAL: 21 mg/dL (ref 5–40)

## 2015-07-30 ENCOUNTER — Ambulatory Visit (INDEPENDENT_AMBULATORY_CARE_PROVIDER_SITE_OTHER): Payer: Commercial Managed Care - HMO | Admitting: Cardiology

## 2015-07-30 ENCOUNTER — Encounter: Payer: Self-pay | Admitting: Cardiology

## 2015-07-30 VITALS — BP 130/74 | HR 72 | Ht 68.0 in | Wt 249.0 lb

## 2015-07-30 DIAGNOSIS — I10 Essential (primary) hypertension: Secondary | ICD-10-CM | POA: Diagnosis not present

## 2015-07-30 DIAGNOSIS — I7121 Aneurysm of the ascending aorta, without rupture: Secondary | ICD-10-CM

## 2015-07-30 DIAGNOSIS — I712 Thoracic aortic aneurysm, without rupture: Secondary | ICD-10-CM | POA: Diagnosis not present

## 2015-07-30 DIAGNOSIS — I2510 Atherosclerotic heart disease of native coronary artery without angina pectoris: Secondary | ICD-10-CM

## 2015-07-30 NOTE — Patient Instructions (Signed)
Your physician wants you to follow-up in: 1 year with Dr. McDowell You will receive a reminder letter in the mail two months in advance. If you don't receive a letter, please call our office to schedule the follow-up appointment.  Your physician recommends that you continue on your current medications as directed. Please refer to the Current Medication list given to you today.  Thank you for choosing  [facility]!!   

## 2015-07-30 NOTE — Progress Notes (Signed)
Cardiology Office Note  Date: 07/30/2015   ID: Joseph, [name] Jan 21, 1941, MRN [id number]  PCP: Chevis Pretty, FNP  Primary Cardiologist: Rozann Lesches, MD   Chief Complaint  Patient presents with  . Follow-up cardiac catheterization    History of Present Illness: Joseph [name] is a 74 y.o. [name] seen in consultation in July and referred for a diagnostic cardiac catheterization to further evaluate intermittent chest discomfort noted in association with equivocal noninvasive cardiac testing, and also an ascending aortic aneurysm of 4.8 cm being followed by Dr. Servando Snare.   Cardiac catheterization was performed by Dr. Claiborne Billings on July 27 and revealed only mild atherosclerosis including 25% LAD stenosis and 30-40% RCA stenosis, LVEF 45-50% with mild inferior hypokinesis. Medical therapy was recommended. We discussed the results today.  Joseph is here with his wife, overall seems to be doing reasonably well. I reviewed his home blood pressure checks, very well controlled.  Joseph will be seeing Dr. Servando Snare for follow-up of his ascending aortic aneurysm around the end of the year.   Past Medical History  Diagnosis Date  . GERD (gastroesophageal reflux disease)   . Essential hypertension   . Anxiety   . Insomnia   . Gout   . History of kidney stones   . Ascending aortic aneurysm     Current Outpatient Prescriptions  Medication Sig Dispense Refill  . allopurinol (ZYLOPRIM) 300 MG tablet Take 1 tablet (300 mg total) by mouth daily. 90 tablet 1  . aspirin 81 MG tablet Take 81 mg by mouth daily.    Marland Kitchen atorvastatin (LIPITOR) 40 MG tablet Take 1 tablet (40 mg total) by mouth daily. 90 tablet 3  . benazepril (LOTENSIN) 20 MG tablet Take 1 tablet (20 mg total) by mouth daily. 90 tablet 3  . clonazePAM (KLONOPIN) 0.5 MG tablet Take 1 tablet (0.5 mg total) by mouth 2 (two) times daily as needed for anxiety. 60 tablet 0  . docusate sodium (COLACE) 100 MG capsule Take 100 mg by mouth daily as  needed for mild constipation.    . fish oil-omega-3 fatty acids 1000 MG capsule Take 2 g by mouth daily.    . hydrochlorothiazide (HYDRODIURIL) 25 MG tablet Take 1 tablet (25 mg total) by mouth daily. 90 tablet 3  . metoprolol tartrate (LOPRESSOR) 25 MG tablet Take 0.5 tablets (12.5 mg total) by mouth 2 (two) times daily. 90 tablet 3  . Multiple Vitamins-Minerals (COMPLETE MULTIVITAMIN/MINERAL PO) Take 1 tablet by mouth daily.    Marland Kitchen NIFEdipine (PROCARDIA-XL/ADALAT-CC/NIFEDICAL-XL) 30 MG 24 hr tablet Take 1 tablet (30 mg total) by mouth daily. 90 tablet 1  . Potassium 99 MG TABS Take 1 tablet by mouth daily.    . traMADol (ULTRAM) 50 MG tablet Take 1 tablet (50 mg total) by mouth every 8 (eight) hours as needed. (Patient taking differently: Take 50 mg by mouth every 8 (eight) hours as needed for moderate pain. USUALLY TAKES I/2 TAB) 60 tablet 0  . zolpidem (AMBIEN) 10 MG tablet Take 1 tablet (10 mg total) by mouth at bedtime as needed. for sleep 90 tablet 1   No current facility-administered medications for this visit.    Allergies:  Carafate; Ciprofloxacin hcl; Prevacid; and Sulfa antibiotics   Social History: The patient  reports that Joseph quit smoking about 57 years ago. His smoking use included Cigarettes. Joseph has a .5 pack-year smoking history. Joseph has quit using smokeless tobacco. His smokeless tobacco use included Chew. Joseph reports that Joseph does  not drink alcohol or use illicit drugs.   ROS:  Please see the history of present illness. Otherwise, complete review of systems is positive for anxiety.  All other systems are reviewed and negative.   Physical Exam: VS:  BP 130/74 mmHg  Pulse 72  Ht 5\' 8"  (1.727 m)  Wt 249 lb (112.946 kg)  BMI 37.87 kg/m2  SpO2 95%, BMI Body mass index is 37.87 kg/(m^2).  Wt Readings from Last 3 Encounters:  07/30/15 249 lb (112.946 kg)  07/27/15 247 lb 9.6 oz (112.311 kg)  07/12/15 258 lb (117.028 kg)    General: Obese [name], appears comfortable at  rest. HEENT: Conjunctiva and lids normal, oropharynx clear. Neck: Supple, increased girth without obvious elevated JVP or carotid bruits, no thyromegaly. Lungs: Clear to auscultation, nonlabored breathing at rest. Cardiac: Regular rate and rhythm, no S3 or significant systolic murmur, no pericardial rub. Abdomen: Soft, nontender, protuberant, bowel sounds present. Extremities: 1+ leg edema with venous varicosities, distal pulses 2+. Skin: Warm and dry. Musculoskeletal: No kyphosis. Neuropsychiatric: Alert and oriented x3, affect grossly appropriate.   ECG: ECG is not ordered today.   Recent Labwork: 06/27/2015: Hemoglobin 13.2; Platelets 182 07/27/2015: ALT 19; AST 20; BUN 11; Creatinine, Ser 0.93; Potassium 3.9; Sodium 138     Component Value Date/Time   CHOL 121 07/27/2015 1252   CHOL 149 04/18/2015 0942   CHOL 208* 03/22/2013 0928   TRIG 106 07/27/2015 1252   TRIG 142 04/18/2015 0942   TRIG 163* 03/22/2013 0928   HDL 46 07/27/2015 1252   HDL 43 04/18/2015 0942   HDL 39* 03/22/2013 0928   CHOLHDL 2.6 07/27/2015 1252   LDLCALC 54 07/27/2015 1252   LDLCALC 65 07/10/2014 0936   LDLCALC 136* 03/22/2013 0928    Assessment and Plan:  1. Mild coronary atherosclerosis by recent cardiac catheterization, asymptomatic. Continue medical therapy which now includes aspirin, beta blocker, ACE inhibitor, and statin.  2. 4.8 cm ascending aortic aneurysm, being followed by Dr. Servando Snare.  3. Essential hypertension, recent blood pressure control has been good.  Current medicines were reviewed with the patient today.  Disposition: FU with me in 1 year.   Signed, Satira Sark, MD, [facility] 07/30/2015 5:35 PM    [facility], [place], [place] [address] Phone: [phone]; Fax: [phone]

## 2015-08-09 ENCOUNTER — Other Ambulatory Visit: Payer: Self-pay | Admitting: Nurse Practitioner

## 2015-08-09 NOTE — Telephone Encounter (Signed)
Last filled 06/25/15, last seen 07/27/15. Call in at [facility]

## 2015-08-09 NOTE — Telephone Encounter (Signed)
Please call in ambien with 1 refills 

## 2015-08-09 NOTE — Telephone Encounter (Signed)
rx called into pharmacy

## 2015-08-16 ENCOUNTER — Other Ambulatory Visit: Payer: Self-pay | Admitting: Family Medicine

## 2015-08-16 NOTE — Telephone Encounter (Signed)
Last seen 07/27/15  MMM If approved route to nurse to call into [facility]

## 2015-08-16 NOTE — Telephone Encounter (Signed)
Please call in klonipin with 1 refills 

## 2015-08-16 NOTE — Telephone Encounter (Signed)
Refill called to [facility] 

## 2015-08-22 ENCOUNTER — Telehealth: Payer: Self-pay | Admitting: Nurse Practitioner

## 2015-10-04 ENCOUNTER — Other Ambulatory Visit: Payer: Self-pay | Admitting: Nurse Practitioner

## 2015-10-08 ENCOUNTER — Other Ambulatory Visit: Payer: Self-pay | Admitting: Nurse Practitioner

## 2015-10-30 ENCOUNTER — Encounter: Payer: Self-pay | Admitting: Nurse Practitioner

## 2015-10-30 ENCOUNTER — Ambulatory Visit (INDEPENDENT_AMBULATORY_CARE_PROVIDER_SITE_OTHER): Payer: Commercial Managed Care - HMO | Admitting: Nurse Practitioner

## 2015-10-30 VITALS — BP 124/77 | HR 72 | Temp 97.1°F | Ht 68.0 in | Wt 251.0 lb

## 2015-10-30 DIAGNOSIS — I712 Thoracic aortic aneurysm, without rupture: Secondary | ICD-10-CM

## 2015-10-30 DIAGNOSIS — M1A09X0 Idiopathic chronic gout, multiple sites, without tophus (tophi): Secondary | ICD-10-CM | POA: Diagnosis not present

## 2015-10-30 DIAGNOSIS — I10 Essential (primary) hypertension: Secondary | ICD-10-CM | POA: Diagnosis not present

## 2015-10-30 DIAGNOSIS — Z1212 Encounter for screening for malignant neoplasm of rectum: Secondary | ICD-10-CM

## 2015-10-30 DIAGNOSIS — E785 Hyperlipidemia, unspecified: Secondary | ICD-10-CM | POA: Diagnosis not present

## 2015-10-30 DIAGNOSIS — I7121 Aneurysm of the ascending aorta, without rupture: Secondary | ICD-10-CM

## 2015-10-30 DIAGNOSIS — E876 Hypokalemia: Secondary | ICD-10-CM | POA: Diagnosis not present

## 2015-10-30 DIAGNOSIS — N400 Enlarged prostate without lower urinary tract symptoms: Secondary | ICD-10-CM

## 2015-10-30 DIAGNOSIS — Z23 Encounter for immunization: Secondary | ICD-10-CM | POA: Diagnosis not present

## 2015-10-30 DIAGNOSIS — G4700 Insomnia, unspecified: Secondary | ICD-10-CM | POA: Diagnosis not present

## 2015-10-30 MED ORDER — HYDROCHLOROTHIAZIDE 25 MG PO TABS: 25.0000 mg | ORAL_TABLET | Freq: Every day | ORAL | Status: DC

## 2015-10-30 MED ORDER — POTASSIUM 99 MG PO TABS: 1.0000 | ORAL_TABLET | Freq: Every day | ORAL | Status: DC

## 2015-10-30 MED ORDER — BENAZEPRIL HCL 20 MG PO TABS: 20.0000 mg | ORAL_TABLET | Freq: Every day | ORAL | Status: DC

## 2015-10-30 MED ORDER — NIFEDIPINE ER OSMOTIC RELEASE 30 MG PO TB24: 30.0000 mg | ORAL_TABLET | Freq: Every day | ORAL | Status: DC

## 2015-10-30 MED ORDER — ZOLPIDEM TARTRATE 10 MG PO TABS: ORAL_TABLET | ORAL | Status: DC

## 2015-10-30 NOTE — Progress Notes (Signed)
Subjective:    Patient ID: Joseph [name], male    DOB: [birth year], 74 y.o.   MRN: [id number]  Patient here today for follow up of chronic medical problems. No acute complaints .  Hyperlipidemia This is a chronic problem. The current episode started more than 1 year ago. The problem is controlled. Recent lipid tests were reviewed and are normal. He has no history of diabetes, hypothyroidism or obesity. There are no known factors aggravating his hyperlipidemia. Pertinent negatives include no chest pain or shortness of breath. Current antihyperlipidemic treatment includes statins. The current treatment provides moderate improvement of lipids. Compliance problems include adherence to diet and adherence to exercise.  Risk factors for coronary artery disease include dyslipidemia, hypertension and obesity.  Hypertension This is a chronic problem. The current episode started more than 1 year ago. The problem is unchanged. The problem is controlled. Pertinent negatives include no chest pain, headaches, palpitations or shortness of breath. Risk factors for coronary artery disease include dyslipidemia and male gender. Past treatments include diuretics and ACE inhibitors. The current treatment provides significant improvement. Compliance problems include diet and exercise.   Insomnia Ambien- works well - can't sleep without it BPH Pt voiding without difficulty. He stopped seeing Carmelina Noun.  GOUT  Usually in his feet- he has not have an attack for some time. Taking allopurinol 345m.  GAD Has klonopin rx but only takes occasionally- has not taken one in several weeks. Thoracic aortic aneurysm Being watch by cardiology- no problems that he is aware of.    Review of Systems  Constitutional: Negative.   HENT: Negative.   Respiratory: Negative.  Negative for shortness of breath.   Cardiovascular: Negative.  Negative for chest pain, palpitations and leg swelling.  Genitourinary: Negative.   Musculoskeletal:  Negative.   Skin: Negative.   Neurological: Negative for headaches.  Hematological: Negative.   Psychiatric/Behavioral: Negative.   All other systems reviewed and are negative.      Objective:   Physical Exam  Constitutional: He is oriented to person, place, and time. He appears well-developed and well-nourished.  HENT:  Head: Normocephalic.  Right Ear: External ear normal.  Left Ear: External ear normal.  Nose: Nose normal.  Mouth/Throat: Oropharynx is clear and moist.  Eyes: EOM are normal. Pupils are equal, round, and reactive to light.  Neck: Normal range of motion. Neck supple. No JVD present. No thyromegaly present.  Cardiovascular: Normal rate, regular rhythm, normal heart sounds and intact distal pulses.  Exam reveals no gallop and no friction rub.   No murmur heard. (+) allen test bil hands  Pulmonary/Chest: Effort normal and breath sounds normal. No respiratory distress. He has no wheezes. He has no rales. He exhibits no tenderness.  Abdominal: Soft. Bowel sounds are normal. He exhibits no mass. There is no tenderness.  Musculoskeletal: Normal range of motion. He exhibits no edema.  Lymphadenopathy:    He has no cervical adenopathy.  Neurological: He is alert and oriented to person, place, and time. No cranial nerve deficit.  Skin: Skin is warm and dry.  Psychiatric: He has a normal mood and affect. His behavior is normal. Judgment and thought content normal.    BP 124/77 mmHg  Pulse 72  Temp(Src) 97.1 F (36.2 C) (Oral)  Ht '5\' 8"'  (1.727 m)  Wt 251 lb (113.853 kg)  BMI 38.17 kg/m2       Assessment & Plan:   1. Essential hypertension, benign Do not add salt to diet - hydrochlorothiazide (HYDRODIURIL) 25 MG  tablet; Take 1 tablet (25 mg total) by mouth daily.  Dispense: 90 tablet; Refill: 3 - benazepril (LOTENSIN) 20 MG tablet; Take 1 tablet (20 mg total) by mouth daily.  Dispense: 90 tablet; Refill: 1 - CMP14+EGFR  2. Thoracic ascending aortic aneurysm  ([place]) Keep follow  Up with cardiologist - NIFEdipine (PROCARDIA-XL/ADALAT-CC/NIFEDICAL-XL) 30 MG 24 hr tablet; Take 1 tablet (30 mg total) by mouth daily.  Dispense: 90 tablet; Refill: 1  3. BPH (benign prostatic hyperplasia)  4. Hyperlipidemia with target LDL less than 100 Low fat diet - Lipid panel  5. Insomnia Bedtime ritual - zolpidem (AMBIEN) 10 MG tablet; TAKE ONE TABLET BY MOUTH ONCE DAILY AT BEDTIME AS NEEDED FOR  SLEEP  Dispense: 90 tablet; Refill: 1  6. Idiopathic chronic gout of multiple sites without tophus  7. Morbid obesity, unspecified obesity type ([place]) Discussed diet and exercise for person with BMI >25 Will recheck weight in 3-6 months   8. Hypokalemia - Potassium 99 MG TABS; Take 1 tablet (99 mg total) by mouth daily.  Dispense: 330 each; Refill: 1  9. Screening for malignant neoplasm of the rectum - Fecal occult blood, imunochemical; Future   Counseling done on all immunizations given today- flu and zostavax Labs pending Health maintenance reviewed Diet and exercise encouraged Continue all meds Follow up  In 3 month   Morris, FNP

## 2015-10-30 NOTE — Patient Instructions (Signed)

## 2015-10-30 NOTE — Addendum Note (Signed)
Addended by: Rolena Infante on: 10/30/2015 12:52 PM   Modules accepted: Orders

## 2015-10-31 LAB — CMP14+EGFR
ALBUMIN: 4.1 g/dL (ref 3.5–4.8)
ALT: 18 IU/L (ref 0–44)
AST: 22 IU/L (ref 0–40)
Albumin/Globulin Ratio: 1.8 (ref 1.1–2.5)
Alkaline Phosphatase: 62 IU/L (ref 39–117)
BUN / CREAT RATIO: 15 (ref 10–22)
BUN: 14 mg/dL (ref 8–27)
Bilirubin Total: 0.5 mg/dL (ref 0.0–1.2)
CALCIUM: 9.6 mg/dL (ref 8.6–10.2)
CO2: 22 mmol/L (ref 18–29)
CREATININE: 0.94 mg/dL (ref 0.76–1.27)
Chloride: 100 mmol/L (ref 97–106)
GFR, EST AFRICAN AMERICAN: 93 mL/min/{1.73_m2} (ref >59)
GFR, EST NON AFRICAN AMERICAN: 81 mL/min/{1.73_m2} (ref >59)
GLOBULIN, TOTAL: 2.3 g/dL (ref 1.5–4.5)
Glucose: 98 mg/dL (ref 65–99)
Potassium: 4.1 mmol/L (ref 3.5–5.2)
SODIUM: 140 mmol/L (ref 136–144)
TOTAL PROTEIN: 6.4 g/dL (ref 6.0–8.5)

## 2015-10-31 LAB — LIPID PANEL
CHOL/HDL RATIO: 3.2 ratio (ref 0.0–5.0)
CHOLESTEROL TOTAL: 135 mg/dL (ref 100–199)
HDL: 42 mg/dL (ref >39)
LDL CALC: 69 mg/dL (ref 0–99)
Triglycerides: 119 mg/dL (ref 0–149)
VLDL CHOLESTEROL CAL: 24 mg/dL (ref 5–40)

## 2015-11-01 ENCOUNTER — Other Ambulatory Visit: Payer: Commercial Managed Care - HMO

## 2015-11-01 DIAGNOSIS — Z1212 Encounter for screening for malignant neoplasm of rectum: Secondary | ICD-10-CM

## 2015-11-01 NOTE — Progress Notes (Signed)
Lab only 

## 2015-11-02 LAB — FECAL OCCULT BLOOD, IMMUNOCHEMICAL: FECAL OCCULT BLD: NEGATIVE

## 2015-11-03 IMAGING — CR DG FOOT COMPLETE 3+V*L*
3 series · 3 of 3 positions shown · non-contrast
Comparison: None.

CLINICAL DATA: Foot pain

EXAM:
LEFT FOOT - COMPLETE 3+ VIEW

[view not recorded (1 of 3)]
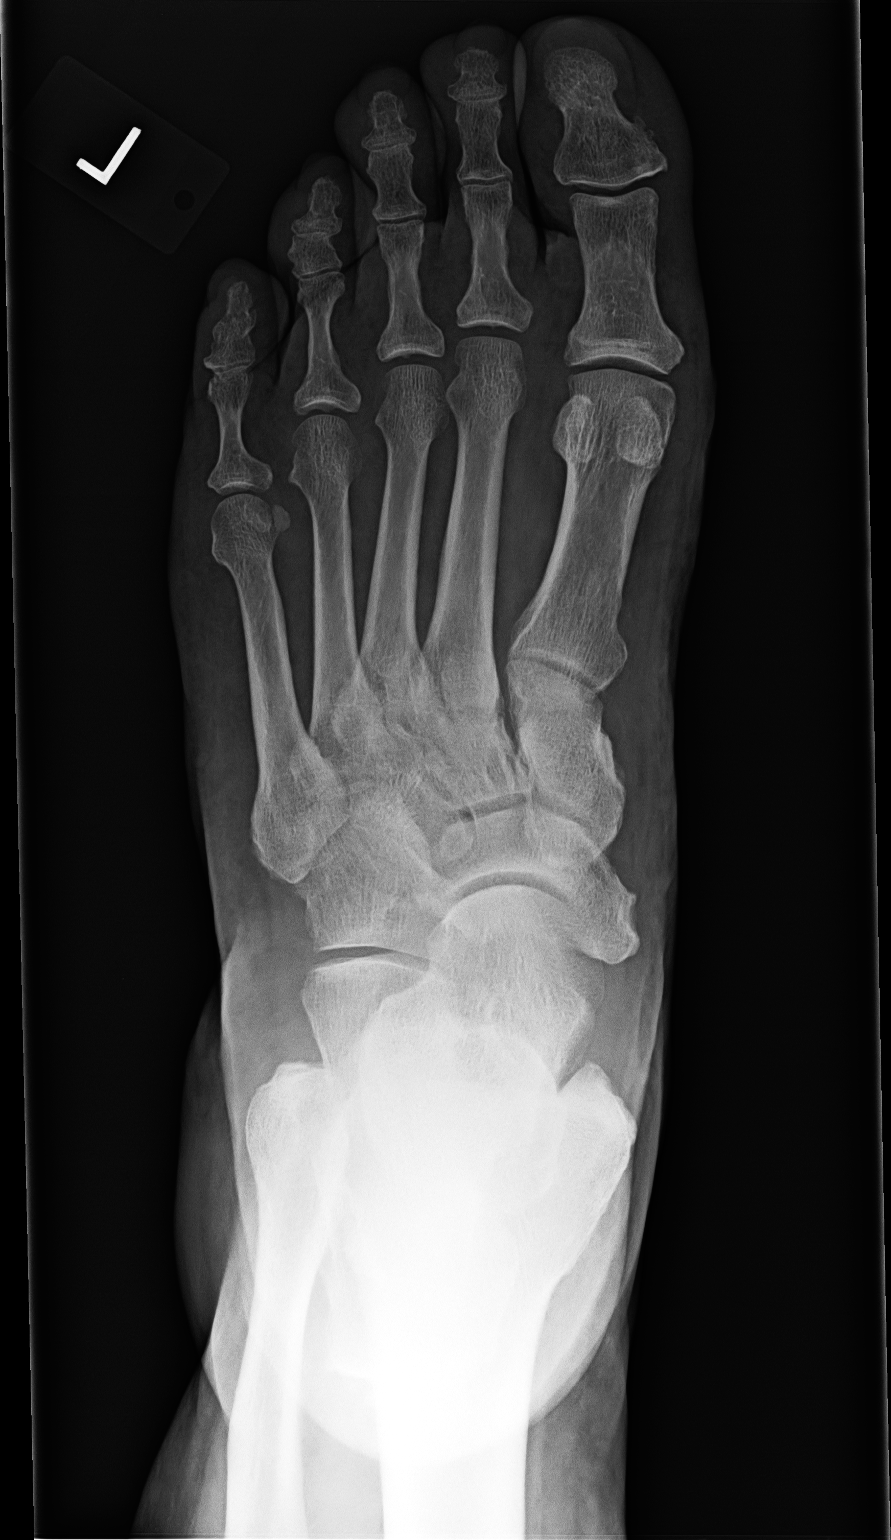

[view not recorded (2 of 3)]
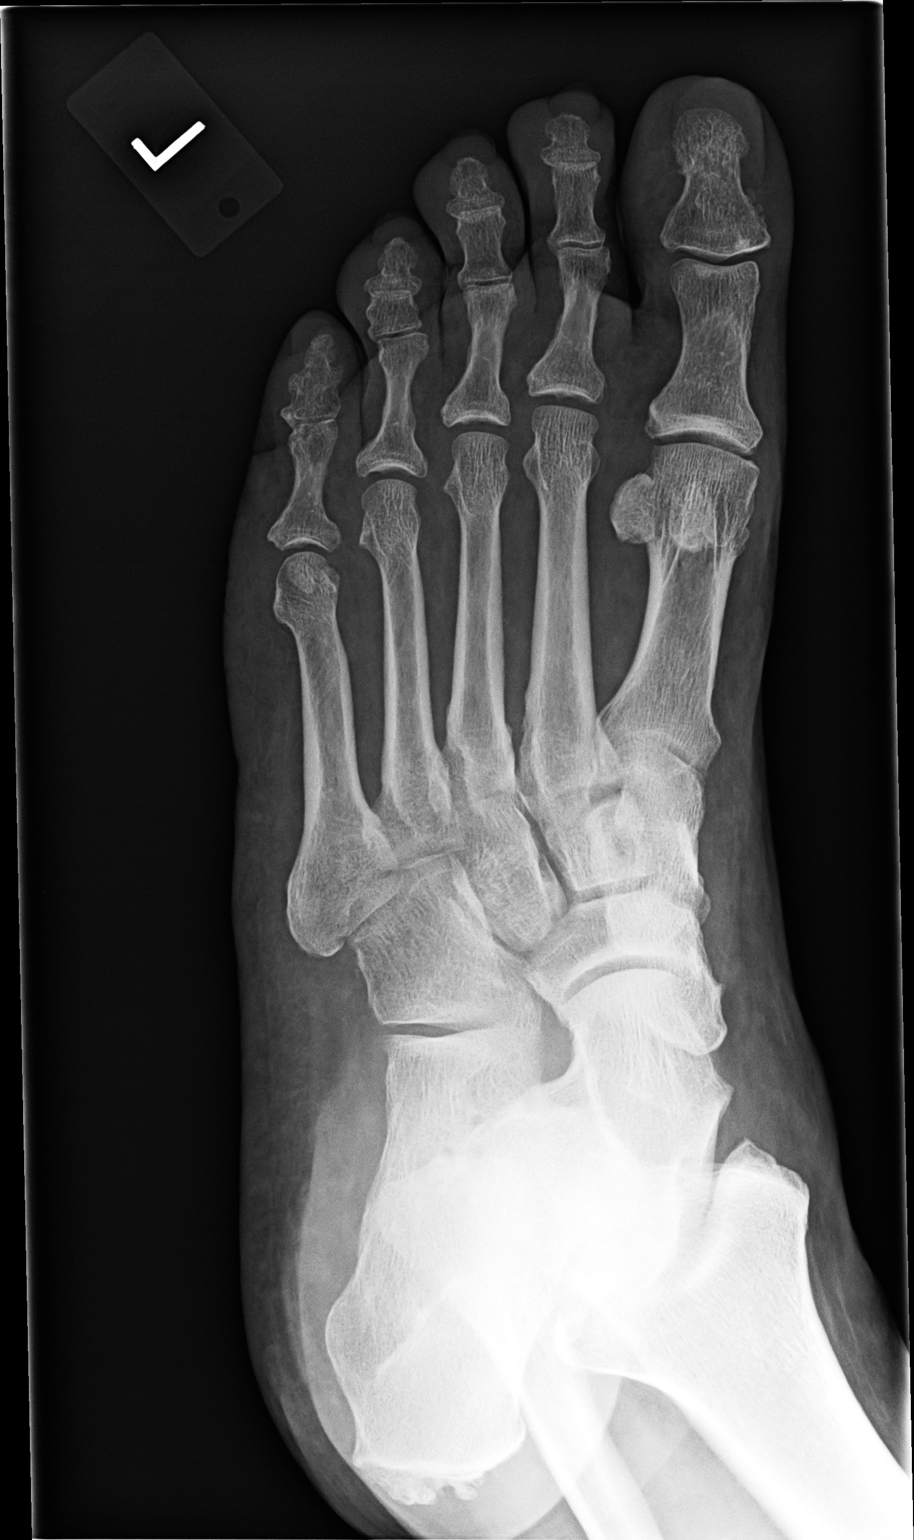

[view not recorded (3 of 3)]
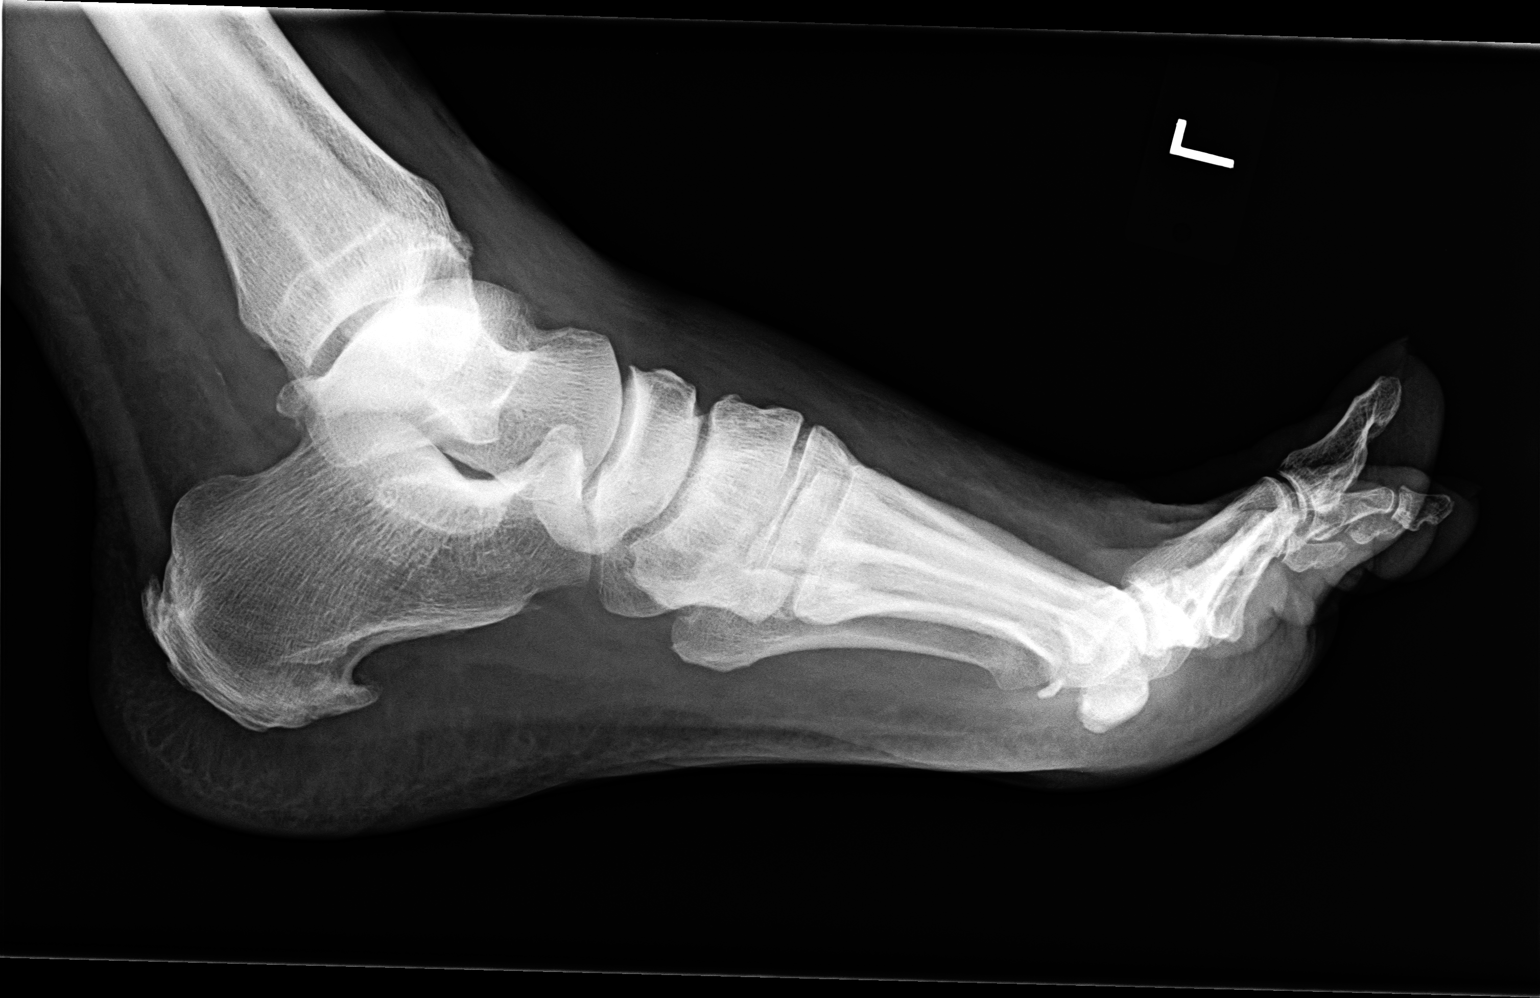

[3 of 3 positions shown; findings below may reference images not displayed]

FINDINGS: Negative for fracture. No significant arthropathy. Degenerative
spurring of the calcaneus.
IMPRESSION: Calcaneal spurring.  No acute bony abnormality.

## 2015-11-28 ENCOUNTER — Other Ambulatory Visit: Payer: Self-pay | Admitting: *Deleted

## 2015-11-28 DIAGNOSIS — I712 Thoracic aortic aneurysm, without rupture, unspecified: Secondary | ICD-10-CM

## 2015-12-17 ENCOUNTER — Telehealth: Payer: Self-pay | Admitting: Nurse Practitioner

## 2015-12-17 DIAGNOSIS — I712 Thoracic aortic aneurysm, without rupture: Secondary | ICD-10-CM | POA: Diagnosis not present

## 2015-12-17 LAB — CREATININE, ISTAT: Creatinine, IStat: 0.9 mg/dL (ref 0.6–1.3)

## 2015-12-18 NOTE — Telephone Encounter (Signed)
appt made

## 2015-12-20 ENCOUNTER — Ambulatory Visit (INDEPENDENT_AMBULATORY_CARE_PROVIDER_SITE_OTHER): Payer: Commercial Managed Care - HMO | Admitting: Cardiothoracic Surgery

## 2015-12-20 ENCOUNTER — Ambulatory Visit
Admission: RE | Admit: 2015-12-20 | Discharge: 2015-12-20 | Disposition: A | Discharge status: Home / Self Care | Payer: Commercial Managed Care - HMO | Source: Ambulatory Visit | Attending: Cardiothoracic Surgery | Admitting: Cardiothoracic Surgery

## 2015-12-20 ENCOUNTER — Encounter: Payer: Self-pay | Admitting: Cardiothoracic Surgery

## 2015-12-20 VITALS — BP 139/80 | HR 80 | Resp 20 | Ht 68.0 in | Wt 250.0 lb

## 2015-12-20 DIAGNOSIS — I712 Thoracic aortic aneurysm, without rupture, unspecified: Secondary | ICD-10-CM

## 2015-12-20 MED ORDER — IOPAMIDOL (ISOVUE-370) INJECTION 76%
75.0000 mL | Freq: Once | INTRAVENOUS | Status: AC | PRN
  Administered 2015-12-20: 75 mL via INTRAVENOUS

## 2015-12-20 NOTE — Progress Notes (Signed)
[address]       [place] [address]             [phone]                    Makar F [name] [place] Medical Record [id number] Date of Birth: [birth year]  Referring: Hinda Lenis Primary Care: Chevis Pretty, FNP  Chief Complaint:    No chief complaint on file.   History of Present Illness:    Joseph [name] 75 y.o. male is seen in the office in follow-up after a recent admission to [facility]. The patient notes that on July 4 holiday he developed epigastric and substernal chest pain radiating to the left neck into both arms, with numbness in his hands. At the time he was camping at [facility] and went to the [facility] emergency room and was admitted. He was hospitalized for several days. No cardiac catheterization was performed. I do not have any results of cardiac enzymes. The patient did have a CT scan of the chest which revealed a 4.8 cm dilatation of the ascending aorta. On discharge the patient was told to see a thoracic surgeon. He comes to the office today at the urging of Tiffany Corsi PA, at medical practice in [place]. He denies any similar symptoms since discharge home but was told to limit his activity. He does note that he fatigues easily and has shortness of breath with exertion. He has chronic lower extremity edema  Since last seen had cardiac cath: Conclusion     Mid LAD lesion, 25% stenosed.  Mid RCA lesion, 35% stenosed.  There is mild left ventricular systolic dysfunction.  Mild LV dysfunction with an ejection fraction of 45-50% with a subtle region of mild mid inferior hypocontractility.  Mild nonobstructive 2 vessel CAD with 25% smooth stenosis in the LAD immediately after the takeoff of the first diagonal vessel; normal left circumflex artery; dominant right coronary artery with smooth 30-40% mid stenosis.  RECOMMENDATION:  Medical therapy.      Current Activity/ Functional Status:  Patient is  independent with mobility/ambulation, transfers, ADL's, IADL's.   Zubrod Score: At the time of surgery this patient's most appropriate activity status/level should be described as: []     0    Normal activity, no symptoms [x]     1    Restricted in physical strenuous activity but ambulatory, able to do out light work []     2    Ambulatory and capable of self care, unable to do work activities, up and about               >50 % of waking hours                              []     3    Only limited self care, in bed greater than 50% of waking hours []     4    Completely disabled, no self care, confined to bed or chair []     5    Moribund   Past Medical History  Diagnosis Date  . GERD (gastroesophageal reflux disease)   . Essential hypertension   . Anxiety   . Insomnia   . Gout   . History of kidney stones   . Ascending aortic aneurysm [facility])     Past Surgical History  Procedure Laterality Date  .  Hernia repair    . Kidney stone surgery    . Cardiac catheterization N/A 06/27/2015    Procedure: Left Heart Cath and Coronary Angiography;  Surgeon: Troy Sine, MD;  Location: [facility];  Service: Cardiovascular;  Laterality: N/A;    Family History  Problem Relation Age of Onset  . Heart disease Father   . Diabetes Sister   . Heart disease Brother   . Deep vein thrombosis Brother   . Congestive Heart Failure Brother    patient has history of sudden death of his father at age 39 and his brother at age 105-  these were attributed to myocardial infarctions but there is no documented autopsy . The patient notes both of them died at home suddenly    Social History   Social History  . Marital Status: Married    Spouse Name: N/A  . Number of Children: N/A  . Years of Education: N/A   Occupational History  . Not on file.   Social History Main Topics  . Smoking status: Former Smoker -- 0.50 packs/day for 1 years    Types: Cigarettes    Quit date: 03/22/1958  . Smokeless  tobacco: Former Systems developer    Types: Chew     Comment: [place]  . Alcohol Use: No  . Drug Use: No  . Sexual Activity: No   Other Topics Concern  . Not on file   Social History Narrative    History  Smoking status  . Former Smoker -- 0.50 packs/day for 1 years  . Types: Cigarettes  . Quit date: 03/22/1958  Smokeless tobacco  . Former Systems developer  . Types: Chew    Comment: QUIT CHEWING IN 1989    History  Alcohol Use No     Allergies  Allergen Reactions  . Carafate [Sucralfate] Other (See Comments)    TOOK SKIN FROM HIS MOUTH  . Ciprofloxacin Hcl Other (See Comments)    CAN'T REMEMBER  . Prevacid [Lansoprazole] Other (See Comments)    MADE HIM FEEL BAD  . Sulfa Antibiotics Diarrhea    SEVERE    Current Outpatient Prescriptions  Medication Sig Dispense Refill  . allopurinol (ZYLOPRIM) 300 MG tablet TAKE ONE TABLET BY MOUTH ONCE DAILY 90 tablet 1  . aspirin 81 MG tablet Take 81 mg by mouth daily.    Marland Kitchen atorvastatin (LIPITOR) 40 MG tablet Take 1 tablet (40 mg total) by mouth daily. 90 tablet 3  . benazepril (LOTENSIN) 20 MG tablet Take 1 tablet (20 mg total) by mouth daily. 90 tablet 1  . clonazePAM (KLONOPIN) 0.5 MG tablet TAKE ONE TABLET BY MOUTH TWICE DAILY AS NEEDED FOR ANXIETY 60 tablet 1  . docusate sodium (COLACE) 100 MG capsule Take 100 mg by mouth daily as needed for mild constipation.    . fish oil-omega-3 fatty acids 1000 MG capsule Take 2 g by mouth daily.    . hydrochlorothiazide (HYDRODIURIL) 25 MG tablet Take 1 tablet (25 mg total) by mouth daily. 90 tablet 3  . metoprolol tartrate (LOPRESSOR) 25 MG tablet Take 0.5 tablets (12.5 mg total) by mouth 2 (two) times daily. 90 tablet 3  . Multiple Vitamins-Minerals (COMPLETE MULTIVITAMIN/MINERAL PO) Take 1 tablet by mouth daily.    Marland Kitchen NIFEdipine (PROCARDIA-XL/ADALAT-CC/NIFEDICAL-XL) 30 MG 24 hr tablet Take 1 tablet (30 mg total) by mouth daily. 90 tablet 1  . Potassium 99 MG TABS Take 1 tablet (99 mg total) by  mouth daily. 330 each 1  .  traMADol (ULTRAM) 50 MG tablet Take 1 tablet (50 mg total) by mouth every 8 (eight) hours as needed. (Patient taking differently: Take 50 mg by mouth every 8 (eight) hours as needed for moderate pain. USUALLY TAKES I/2 TAB) 60 tablet 0  . zolpidem (AMBIEN) 10 MG tablet TAKE ONE TABLET BY MOUTH ONCE DAILY AT BEDTIME AS NEEDED FOR  SLEEP 90 tablet 1   No current facility-administered medications for this visit.      Review of Systems:     Cardiac Review of Systems: Y or N  Chest Pain [  y  ]  Resting SOB [ y  ] Exertional SOB  Blue.Reese  ]  Orthopnea Blue.Reese  ]   Pedal Edema Blue.Reese   ]    Palpitations [ n ] Syncope  [n  ]   Presyncope [  y ]  General Review of Systems: [Y] = yes [  ]=no Constitional: recent weight change [  ];  Wt loss over the last 3 months [   ] anorexia [  ]; fatigue Blue.Reese  ]; nausea [  ]; night sweats [n  ]; fever [  ]; or chills [  ];          Dental: poor dentition[  ]; Last Dentist visit:   Eye : blurred vision [  ]; diplopia [   ]; vision changes [  ];  Amaurosis fugax[  ]; Resp: cough [  ];  wheezing[  ];  hemoptysis[  ]; shortness of breathy[  ]; paroxysmal nocturnal dyspnea[  ]; dyspnea on exertion[  ]; or orthopnea[  ];  GI:  gallstones[  ], vomiting[  ];  dysphagia[  ]; melena[  ];  hematochezia [  ]; heartburn[  ];   Hx of  Colonoscopy[ pylops removed ?10 years ago ]; GU: kidney stones [  ]; hematuria[  ];   dysuria [  ];  nocturia[  ];  history of     obstruction [  ]; urinary frequency [  ]             Skin: rash, swelling[  ];, hair loss[  ];  peripheral edema[  ];  or itching[  ]; Musculosketetal: myalgias[  ];  joint swelling[  ];  joint erythema[  ];  joint pain[  ];  back pain[  ];  Heme/Lymph: bruising[  ];  bleeding[  ];  anemia[  ];  Neuro: TIA[n  ];  headaches[ n ];  stroke[ n ];  vertigo[  ];  seizures[  ];   paresthesias[  ];  difficulty walking[  ];  Psych:depression[  ]; anxiety[  ];  Endocrine: diabetes[ n ];  thyroid dysfunction[n   ];  Immunizations: Flu up to date Blue.Reese  ]; Pneumococcal up to date Blue.Reese  ];  Other:  Physical Exam: There were no vitals taken for this visit.  PHYSICAL EXAMINATION: General appearance: alert, cooperative, appears older than stated age, no distress and morbidly obese Head: Normocephalic, without obvious abnormality, atraumatic Neck: no adenopathy, no carotid bruit, no JVD, supple, symmetrical, trachea midline and thyroid not enlarged, symmetric, no tenderness/mass/nodules Lymph nodes: Cervical, supraclavicular, and axillary nodes normal. Resp: clear to auscultation bilaterally Back: symmetric, no curvature. ROM normal. No CVA tenderness. 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 below the knee, he does have palpable DP and PT pulses bilaterally  Diagnostic Studies & Laboratory data:     Recent Radiology Findings:  Results are viewed on disk will be loaded int pacs system     Recent Lab Findings: Lab Results  Component Value Date   WBC 8.1 06/27/2015   HGB 13.2 06/27/2015   HCT 39.1 06/27/2015   PLT 182 06/27/2015   GLUCOSE 98 10/30/2015   CHOL 135 10/30/2015   TRIG 119 10/30/2015   HDL 42 10/30/2015   LDLCALC 69 10/30/2015   ALT 18 10/30/2015   AST 22 10/30/2015   NA 140 10/30/2015   K 4.1 10/30/2015   CL 100 10/30/2015   CREATININE 0.94 10/30/2015   BUN 14 10/30/2015   CO2 22 10/30/2015   INR 1.08 06/27/2015   Ct Angio Chest Aorta W/cm &/or Wo/cm  12/20/2015  CLINICAL DATA:  TAA. Occ chest pain. 10# wt loss. Nonsmoker. No prev surg or hx ca. Prev Ct 2002 purged, report available. EXAM: CT ANGIOGRAPHY CHEST WITH CONTRAST TECHNIQUE: Multidetector CT imaging of the chest  was performed using the standard protocol during bolus administration of intravenous contrast. Multiplanar CT image reconstructions and MIPs were obtained to evaluate the vascular anatomy. CONTRAST:  75 cc Isovue 370 COMPARISON:  Report of previous CT exam 07/06/2001, CT of the chest 06/04/2015 from [place]: Heart: Coronary artery calcification is present. Trace pericardial effusion. Heart size is normal. Vascular structures: Ascending aortic aneurysm is 4.8 cm. No dissection. Moderate atherosclerotic calcification of the thoracic aorta. Moderate atherosclerotic calcification of the descending aorta, particularly at the level of the diaphragmatic hiatus. Mediastinum/thyroid: Left thyroid nodule measures 1.0 cm. No significant mediastinal, hilar, or axillary adenopathy. Lungs/Airways: No pulmonary nodules, pleural effusions, or infiltrates. Upper abdomen: Hypervascular 2.3 cm lesion identified at the dome of the right hepatic lobe, stable in appearance and consistent with benign hemangioma. A stable cyst is seen in the lateral segment of the left hepatic lobe measuring 2.4 cm. Findings are consistent with benign cyst. Small splenules are incidentally noted in the left upper quadrant the abdomen. Small hiatal hernia. Chest wall/osseous structures: Mild spondylosis of the thoracic spine. No suspicious lytic or blastic lesions are identified. Review of the MIP images confirms the above findings. IMPRESSION: 1. Ascending aortic aneurysm, 4.8 cm. Appearance is stable compared with previous CT of the chest from July 2016. Recommend semi-annual imaging followup by CTA or MRA and referral to cardiothoracic surgery if not already obtained. This recommendation follows 2010 ACCF/AHA/AATS/ACR/ASA/SCA/SCAI/SIR/STS/SVM Guidelines for the Diagnosis and Management of Patients With Thoracic Aortic Disease. Circulation. 2010; 121: [id number] 2. Coronary artery disease. 3. Trace pericardial effusion. 4. Benign  appearing left thyroid nodule. No further evaluation is felt to be necessary based on consensus criteria. 5. Stable benign liver lesions. Electronically Signed   By: Nolon Nations M.D.   On: 12/20/2015 15:05    I have independently reviewed the above radiology studies  and reviewed the findings with the patient.     Stress test:done [facility]  CLINICAL DATA:  Chest pain  EXAM: MYOCARDIAL IMAGING WITH SPECT (REST AND PHARMACOLOGIC-STRESS)  GATED LEFT VENTRICULAR WALL MOTION STUDY  LEFT VENTRICULAR EJECTION FRACTION  TECHNIQUE: Standard myocardial SPECT imaging was performed after resting intravenous injection of 11.0 mCi Tc-3m sestamibi. Subsequently, intravenous infusion of Lexiscan was performed under the supervision of the Cardiology staff. At peak effect of the drug,  32.6 mCi Tc-38m sestamibi was injected intravenously and standard myocardial SPECT imaging was performed. Quantitative gated imaging was also performed to evaluate left ventricular wall motion, and estimate left ventricular ejection fraction.  COMPARISON:  None.  FINDINGS: Perfusion: Regional images of the left ventricular myocardium show no fixed defect to suggest infarct or reversible lesion to suggest ischemic change. No abnormality is identified on the SPECT study.  Wall Motion: Left ventricle is rather prominent. There is mild apical hypokinesis. No other wall motion abnormality is identified in the left ventricle.  Left Ventricular Ejection Fraction: 47 %  End diastolic volume [id number] ml  End systolic volume 69 ml  IMPRESSION: 1. No reversible ischemia or infarction.  2. There is left ventricular prominence. There is mild apical hypokinesis. No other wall motion abnormalities are identified.  3. Left ventricular ejection fraction 47%  4. Intermediate risk stress test findings*.  *2012 Appropriate Use Criteria for Coronary Revascularization Focused Update: J Am Coll Cardiol.  [id number]. http://content.airportbarriers.com.aspx?articleid=1201161   Electronically Signed   By: Lowella Grip III M.D.   On: 06/06/2015 13:05  No significant chest pain symptoms reported  No significant arrhythmia noted  This is not a final report, NM images to follow  No significant ST segment changes or arrhythmias were noted during  stress  Uneventful Lexiscan injection.  Nuclear perfusion report to follow under separate cover  CT: CLINICAL DATA:  Epigastric pain. Shortness of breath, nausea. Back pain. History of hypertension, Raynaud's disease, hernia repair.  EXAM: CT ANGIOGRAPHY CHEST, ABDOMEN AND PELVIS  TECHNIQUE: Multidetector CT imaging through the chest, abdomen and pelvis was performed using the standard protocol during bolus administration of intravenous contrast. Multiplanar reconstructed images and MIPs were obtained and reviewed to evaluate the vascular anatomy.  CONTRAST:  100 cc Omnipaque 350  COMPARISON:  None.  FINDINGS: CTA CHEST FINDINGS  Mediastinum: Fusiform ascending aorta is enlarged, 4.8 cm with moderate calcific atherosclerosis of the aortic arch and descending aorta. Subcentimeter focal intimal hematoma descending aorta. No abnormal density within upper along the course of the aorta. Small amount of free fluid in the is sulcus, normal. No dissection, suspicious luminal irregularity/ulceration, contrast extravasation or periaortic hematoma. Heart size is mildly enlarged. Mild coronary artery calcifications. No pericardial fluid collection. No mediastinal lymphadenopathy.  Lungs: No pleural effusion or focal consolidation. Minimal pleural thickening/scarring of the RIGHT major fissure. Tracheobronchial tree is patent and midline, no pneumothorax.  Soft tissues and osseous structures: Mild degenerative change of the thoracic spine. No destructive bony lesions. Mild gynecomastia.  Review of the MIP images confirms  the above findings.  CTA ABDOMEN AND PELVIS FINDINGS  Retroperitoneum: Aortoiliac vessels are normal in course and caliber with moderate to severe calcific atherosclerosis. Celiac trunk origin is widely patent. Mild calcific atherosclerosis of the origin of the renal arteries, superior mesenteric artery. Inferior mesenteric artery is patent.  Organs: 2.5 cm flash filling hemangioma RIGHT lobe of the liver. 2.3 cm low-density cyst segment 4. No intrahepatic biliary dilatation. Spleen, gallbladder are normal. Fatty pancreas, otherwise unremarkable. Thickened adrenal glands can be seen with hyperplasia without discrete nodule.  GI tract: Small hiatal hernia. Stomach, small large bowel are normal in course and caliber without inflammatory changes though sensitivity may be decreased by lack of enteric contrast.  GU tract: Kidneys are orthotopic, normal size morphology. 4.7 cm exophytic LEFT lower pole cyst. 3 mm LEFT upper pole nonobstructing nephrolithiasis. 2.2 cm exophytic RIGHT interpolar cyst. Prostate is partially distended. Moderate prostatomegaly invading the base of the bladder.  Peritoneum: No intraperitoneal free fluid or free air. No lymphadenopathy by CT size criteria.  Soft tissues and osseous structures: Moderate to large LEFT, small RIGHT fat containing inguinal hernias. Small fat containing umbilical hernia. Grade 1 L5-S1 anterolisthesis, chronic bilateral L5 pars interarticularis defects, moderate to severe L5-S1 neural foraminal narrowing.  Review of the MIP images confirms the above findings.  IMPRESSION: CTA CHEST: Fusiform 4.8 cm ascending aortic aneurysm without dissection or acute vascular process. Recommend semi-annual imaging followup by CTA or MRA and referral to cardiothoracic surgery if not already obtained. This recommendation follows 2010 ACCF/AHA/AATS/ACR/ASA/SCA/SCAI/SIR/STS/SVM Guidelines for the Diagnosis and Management of Patients With  Thoracic Aortic Disease. Circulation. 2010; 121SP:[id number].  No acute cardiopulmonary process.  CTA ABDOMEN AND PELVIS: Atherosclerosis of the aortoiliac vessels without high-grade stenosis or acute vascular process.  No acute intra-abdominal or pelvic process. 3 mm nonobstructing LEFT upper pole nephrolithiasis.  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  Prostatomegaly.   Electronically Signed   By: Elon Alas M.D.   On: 06/05/2015 00:21  ECHO: Transthoracic Echocardiography Report (TTE)  Demographics  Patient Name      MAMOUDOU DIGIROLAMO     Gender                Male  Patient Number    [id number]   Race                  Caucasian  Visit Number      [id number]    Room Number           [id number]  Accession Number  [id number]  Date of Study         06/05/2015  Date of Birth     09-Mar-1941     Referring Physician   ED Fay Records, MD  Age               36 year(s)     Sonographer           Marikate Tennis,                                                         RDCS, BS                                   Interpreting          Rozann Lesches, MD                                   Physician Procedure Type of Study  TTE procedure: ECHOCARDIOGRAM FOLLOW UP/ LIMITED W DOPPLER. Procedure date Date: 06/05/2015 Start: 09:20 AM Technical Quality: Adequate visualization Study Location: Portable Indications: Chest pain. Patient Status: Routine Height: 68.11 inches Weight: 255.74 pounds BSA: 2.27 m2 BMI: 38.76 kg/m2 Rhythm: Normal sinus rhythm HR: 82 bpm BP: 116/69 mmHg Conclusions Summary Mild concentric left ventricular hypertrophy Normal left ventricular systolic function with no appreciable segmental abnormality. Mild AR. Signature ------------------------------------------------------------------------------  Electronically signed by Rozann Lesches, MD(Interpreting physician) on 06/05/2015  01:27 PM ------------------------------------------------------------------------------ Findings Mitral  Valve Structurally normal mitral valve with good mobility and no significant regurgitation by color flow doppler examination. Aortic Valve Structurally normal aortic valve with good  leaflet mobility, and mild regurgitation by color flow doppler examination. Tricuspid Valve Tricuspid valve is structurally normal. Mild tricuspid regurgitation by color flow doppler examination. Pulmonic Valve Pulmonic valve is structurally normal. No Doppler evidence of pulmonic stenosis or insufficiency. Left Atrium Normal size left atrium. Left Ventricle Mild concentric left ventricular hypertrophy Normal left ventricular systolic function with no appreciable segmental abnormality. Right Atrium Mild right atrial enlargement Right Ventricle Mildly dilated right ventricle. Pericardial Effusion No evidence of pericardial effusion. Miscellaneous Aortic root dimension within normal limits. IVC dilated. M-Mode/2D Measurements & Calculations  LV Diastolic Dimension:  LV Systolic Dimension:     LA Dimension: 3.42 cmAO  5.56 cm                  2.92 cm                    Root Dimension: 3.89 cm  LV FS:47.48 %            LV Volume Diastolic: [id number]  LV PW Diastolic: 1.4 cm  ml  Septum Diastolic: 0000000   LV Volume Systolic: 123XX123  cm                       ml                           LV EDV/LV EDV Index: 127   LA/Aorta: 0.88                           ml/56 m2LV ESV/LV ESV                           Index: 50.5 ml/22 m2                           EF Calculated: 60.24 % Doppler Measurements & Calculations  MV Peak E-Wave: 72.7 cm/s AV Peak Velocity: 111  MV Peak A-Wave: 86.4 cm/s cm/s                     Estimated RVSP: 32.8 mmHg  MV E/A Ratio: 0.84        AV Peak Gradient: 4.93   Estimated RAP:10 mmHg  MV Peak Gradient: 2.11    mmHg  mmHg                                                     PV Peak Velocity: 102  TDI E' Velocity: 15 cm/s                           cm/s                                                      PV Peak Gradient: 4.16  mmHg  E/E' prime 4.9 [address] [place], [place] [address] [phone]   Aortic Size Index=  4.8       /There is no height or weight on file to calculate BSA. = 2.05    < 2.75 cm/m2      4% risk per year 2.75 to 4.25          8% risk per year > 4.25 cm/m2    20% risk per year  A  Assessment / Plan:  Ascending aortic aneurysm, 4.8 cm. Appearance is stable compared with previous CT of the chest from July 2016.- We'll obtain follow-up CTA  of the chest in 8 months We discussed the need to lose weight and obtain good blood pressure control.    Grace Isaac MD      [address] [place], [address] Office [phone]   Beeper [phone]  12/20/2015 10:48 AM

## 2016-01-24 ENCOUNTER — Other Ambulatory Visit: Payer: Self-pay | Admitting: Nurse Practitioner

## 2016-01-24 ENCOUNTER — Other Ambulatory Visit: Payer: Self-pay | Admitting: Physician Assistant

## 2016-01-24 NOTE — Telephone Encounter (Signed)
Ultram rx ready for pick up  

## 2016-01-24 NOTE — Telephone Encounter (Signed)
Please call in klonopin with 1 refills 

## 2016-01-24 NOTE — Telephone Encounter (Signed)
Last seen 10/30/15  MMM If approved route to nurse to call into [facility]

## 2016-01-24 NOTE — Telephone Encounter (Signed)
Last seen 10/30/15 MMM  If approved print 

## 2016-01-25 NOTE — Telephone Encounter (Signed)
Pt aware written Rx is at front desk ready for pickup  

## 2016-01-25 NOTE — Telephone Encounter (Signed)
Refill called to [facility] 

## 2016-02-11 ENCOUNTER — Ambulatory Visit (INDEPENDENT_AMBULATORY_CARE_PROVIDER_SITE_OTHER): Payer: Commercial Managed Care - HMO | Admitting: Nurse Practitioner

## 2016-02-11 ENCOUNTER — Encounter: Payer: Self-pay | Admitting: Nurse Practitioner

## 2016-02-11 VITALS — BP 115/73 | HR 64 | Temp 96.8°F | Ht 68.0 in | Wt 260.0 lb

## 2016-02-11 DIAGNOSIS — E785 Hyperlipidemia, unspecified: Secondary | ICD-10-CM

## 2016-02-11 DIAGNOSIS — G4700 Insomnia, unspecified: Secondary | ICD-10-CM

## 2016-02-11 DIAGNOSIS — E876 Hypokalemia: Secondary | ICD-10-CM

## 2016-02-11 DIAGNOSIS — I10 Essential (primary) hypertension: Secondary | ICD-10-CM

## 2016-02-11 DIAGNOSIS — I712 Thoracic aortic aneurysm, without rupture: Secondary | ICD-10-CM | POA: Diagnosis not present

## 2016-02-11 DIAGNOSIS — I7121 Aneurysm of the ascending aorta, without rupture: Secondary | ICD-10-CM

## 2016-02-11 DIAGNOSIS — M1A09X0 Idiopathic chronic gout, multiple sites, without tophus (tophi): Secondary | ICD-10-CM

## 2016-02-11 DIAGNOSIS — N400 Enlarged prostate without lower urinary tract symptoms: Secondary | ICD-10-CM

## 2016-02-11 DIAGNOSIS — I7300 Raynaud's syndrome without gangrene: Secondary | ICD-10-CM

## 2016-02-11 LAB — LIPID PANEL
CHOL/HDL RATIO: 3.3 ratio (ref 0.0–5.0)
Cholesterol, Total: 126 mg/dL (ref 100–199)
HDL: 38 mg/dL — ABNORMAL LOW (ref >39)
LDL Calculated: 65 mg/dL (ref 0–99)
Triglycerides: 114 mg/dL (ref 0–149)
VLDL Cholesterol Cal: 23 mg/dL (ref 5–40)

## 2016-02-11 LAB — CMP14+EGFR
A/G RATIO: 1.4 (ref 1.2–2.2)
ALT: 17 IU/L (ref 0–44)
AST: 20 IU/L (ref 0–40)
Albumin: 3.9 g/dL (ref 3.5–4.8)
Alkaline Phosphatase: 64 IU/L (ref 39–117)
BILIRUBIN TOTAL: 0.4 mg/dL (ref 0.0–1.2)
BUN/Creatinine Ratio: 16 (ref 10–22)
BUN: 14 mg/dL (ref 8–27)
CALCIUM: 9.3 mg/dL (ref 8.6–10.2)
CHLORIDE: 99 mmol/L (ref 96–106)
CO2: 25 mmol/L (ref 18–29)
Creatinine, Ser: 0.88 mg/dL (ref 0.76–1.27)
GFR, EST AFRICAN AMERICAN: 99 mL/min/{1.73_m2} (ref >59)
GFR, EST NON AFRICAN AMERICAN: 85 mL/min/{1.73_m2} (ref >59)
GLOBULIN, TOTAL: 2.7 g/dL (ref 1.5–4.5)
Glucose: 93 mg/dL (ref 65–99)
POTASSIUM: 4.2 mmol/L (ref 3.5–5.2)
SODIUM: 140 mmol/L (ref 134–144)
TOTAL PROTEIN: 6.6 g/dL (ref 6.0–8.5)

## 2016-02-11 NOTE — Progress Notes (Addendum)
Subjective:    Patient ID: Joseph [name], male    DOB: Jun 27, 1941, 75 y.o.   MRN: [id number]  Patient here today for follow up of chronic medical problems. No acute complaints .  Hyperlipidemia This is a chronic problem. The current episode started more than 1 year ago. The problem is controlled. Recent lipid tests were reviewed and are normal. He has no history of diabetes, hypothyroidism or obesity. There are no known factors aggravating his hyperlipidemia. Pertinent negatives include no chest pain or shortness of breath. Current antihyperlipidemic treatment includes statins. The current treatment provides moderate improvement of lipids. Compliance problems include adherence to diet and adherence to exercise.  Risk factors for coronary artery disease include dyslipidemia, hypertension and obesity.  Hypertension This is a chronic problem. The current episode started more than 1 year ago. The problem is unchanged. The problem is controlled. Pertinent negatives include no chest pain, headaches, palpitations or shortness of breath. Risk factors for coronary artery disease include dyslipidemia and male gender. Past treatments include diuretics and ACE inhibitors. The current treatment provides significant improvement. Compliance problems include diet and exercise.   Insomnia Ambien- works well - can't sleep without it BPH Pt voiding without difficulty. He stopped seeing Carmelina Noun.  GOUT  Usually in his feet- he has not have an attack for some time. Taking allopurinol 356m.  GAD Has klonopin rx but only takes occasionally- has not taken one in several weeks. Thoracic aortic aneurysm Being watch by cardiology- no problems that he is aware of. raynauds syndrome Procardia- helps some- still has to sleep in cotton gloves   Review of Systems  Constitutional: Negative.   HENT: Negative.   Eyes: Negative.   Respiratory: Negative.  Negative for shortness of breath.   Cardiovascular: Negative.  Negative  for chest pain, palpitations and leg swelling.  Gastrointestinal: Negative.   Endocrine: Negative.   Genitourinary: Negative.   Musculoskeletal: Negative.   Skin: Negative.   Allergic/Immunologic: Negative.   Neurological: Negative.  Negative for headaches.  Hematological: Negative.   Psychiatric/Behavioral: Negative.   All other systems reviewed and are negative.      Objective:   Physical Exam  Constitutional: He is oriented to person, place, and time. Vital signs are normal. He appears well-developed and well-nourished.  HENT:  Head: Normocephalic.  Right Ear: Hearing, tympanic membrane and external ear normal.  Left Ear: External ear normal.  Nose: Nose normal.  Mouth/Throat: Uvula is midline and oropharynx is clear and moist.  Eyes: Conjunctivae and EOM are normal. Pupils are equal, round, and reactive to light.  Neck: Trachea normal and normal range of motion. Neck supple. No JVD present. No thyromegaly present.  Cardiovascular: Normal rate, regular rhythm, normal heart sounds and intact distal pulses.  Exam reveals no gallop and no friction rub.   No murmur heard. (+) allen test bil hands  Pulmonary/Chest: Effort normal and breath sounds normal. No respiratory distress. He has no wheezes. He has no rales. He exhibits no tenderness.  Abdominal: Soft. Bowel sounds are normal. He exhibits no mass. There is no tenderness.  Musculoskeletal: Normal range of motion. He exhibits no edema.  Lymphadenopathy:    He has no cervical adenopathy.  Neurological: He is alert and oriented to person, place, and time. No cranial nerve deficit.  Skin: Skin is warm, dry and intact.  Psychiatric: He has a normal mood and affect. His speech is normal and behavior is normal. Judgment and thought content normal. Cognition and memory are normal.  BP 115/73 mmHg  Pulse 64  Temp(Src) 96.8 F (36 C) (Oral)  Ht '5\' 8"'  (1.727 m)  Wt 260 lb (117.935 kg)  BMI 39.54 kg/m2       Assessment &  Plan:  1. Essential hypertension, benign Do not add salt to diet  2. Raynauds disease Keep hands warm  3. Thoracic ascending aortic aneurysm ([place])  4. BPH (benign prostatic hyperplasia)  5. Hyperlipidemia with target LDL less than 100 Low fat diet  6. Insomnia Bedtime ritual  7. Idiopathic chronic gout of multiple sites without tophus  8. Morbid obesity, unspecified obesity type ([place]) Discussed diet and exercise for person with BMI >25 Will recheck weight in 3-6 months  9. Hypokalemia   Orders Placed This Encounter  Procedures  . CMP14+EGFR  . Lipid panel   Labs pending Health maintenance reviewed Diet and exercise encouraged Continue all meds Follow up  In 3 month   [place], FNP

## 2016-02-11 NOTE — Addendum Note (Signed)
Addended by: Chevis Pretty on: 02/11/2016 08:42 AM   Modules accepted: Orders

## 2016-02-11 NOTE — Patient Instructions (Signed)

## 2016-03-27 ENCOUNTER — Other Ambulatory Visit: Payer: Self-pay | Admitting: Nurse Practitioner

## 2016-03-27 NOTE — Telephone Encounter (Signed)
Ultram rx ready for pick up  

## 2016-03-27 NOTE — Telephone Encounter (Signed)
Last filled 01/25/2016

## 2016-03-28 ENCOUNTER — Other Ambulatory Visit: Payer: Self-pay | Admitting: Nurse Practitioner

## 2016-03-28 NOTE — Telephone Encounter (Signed)
Aware, script is ready. 

## 2016-05-15 ENCOUNTER — Other Ambulatory Visit: Payer: Self-pay | Admitting: Nurse Practitioner

## 2016-05-16 ENCOUNTER — Other Ambulatory Visit: Payer: Self-pay | Admitting: Family Medicine

## 2016-05-16 ENCOUNTER — Other Ambulatory Visit: Payer: Self-pay | Admitting: Nurse Practitioner

## 2016-05-16 NOTE — Telephone Encounter (Signed)
Go ahead and give him one refill of each, we'll print. He will have to follow up with this in the future with Shelah Lewandowsky prior to next refill

## 2016-05-16 NOTE — Telephone Encounter (Signed)
Last seen 02/11/16  MMM If approved route to nurse to call into [facility]

## 2016-05-16 NOTE — Telephone Encounter (Signed)
I already approved this one earlier today I don't know if it didn't print out

## 2016-05-16 NOTE — Telephone Encounter (Signed)
rx called to [facility] and lm for patient

## 2016-05-16 NOTE — Telephone Encounter (Signed)
rx called to [facility] for patient

## 2016-05-16 NOTE — Telephone Encounter (Signed)
I already approved this one earlier today

## 2016-05-16 NOTE — Telephone Encounter (Signed)
last seen 02/15/16  MMM If approved route to nurse to call into [facility]

## 2016-05-16 NOTE — Telephone Encounter (Signed)
Last seen 02/11/16  MMM  If approved route to nurse to call into [facility]

## 2016-05-26 ENCOUNTER — Ambulatory Visit (INDEPENDENT_AMBULATORY_CARE_PROVIDER_SITE_OTHER): Payer: Commercial Managed Care - HMO | Admitting: Nurse Practitioner

## 2016-05-26 ENCOUNTER — Encounter: Payer: Self-pay | Admitting: Nurse Practitioner

## 2016-05-26 VITALS — BP 127/80 | HR 92 | Temp 98.5°F | Ht 68.0 in | Wt 260.4 lb

## 2016-05-26 DIAGNOSIS — N400 Enlarged prostate without lower urinary tract symptoms: Secondary | ICD-10-CM | POA: Diagnosis not present

## 2016-05-26 DIAGNOSIS — G4700 Insomnia, unspecified: Secondary | ICD-10-CM

## 2016-05-26 DIAGNOSIS — M1A09X0 Idiopathic chronic gout, multiple sites, without tophus (tophi): Secondary | ICD-10-CM

## 2016-05-26 DIAGNOSIS — I10 Essential (primary) hypertension: Secondary | ICD-10-CM

## 2016-05-26 DIAGNOSIS — E876 Hypokalemia: Secondary | ICD-10-CM | POA: Diagnosis not present

## 2016-05-26 DIAGNOSIS — I712 Thoracic aortic aneurysm, without rupture: Secondary | ICD-10-CM

## 2016-05-26 DIAGNOSIS — F411 Generalized anxiety disorder: Secondary | ICD-10-CM

## 2016-05-26 DIAGNOSIS — E785 Hyperlipidemia, unspecified: Secondary | ICD-10-CM

## 2016-05-26 DIAGNOSIS — I7121 Aneurysm of the ascending aorta, without rupture: Secondary | ICD-10-CM

## 2016-05-26 DIAGNOSIS — I7300 Raynaud's syndrome without gangrene: Secondary | ICD-10-CM

## 2016-05-26 MED ORDER — TRAMADOL HCL 50 MG PO TABS: 50.0000 mg | ORAL_TABLET | Freq: Three times a day (TID) | ORAL | Status: DC | PRN

## 2016-05-26 MED ORDER — METOPROLOL TARTRATE 25 MG PO TABS: 12.5000 mg | ORAL_TABLET | Freq: Two times a day (BID) | ORAL | Status: DC

## 2016-05-26 MED ORDER — ZOLPIDEM TARTRATE 10 MG PO TABS: ORAL_TABLET | ORAL | Status: DC

## 2016-05-26 MED ORDER — ATORVASTATIN CALCIUM 40 MG PO TABS: 40.0000 mg | ORAL_TABLET | Freq: Every day | ORAL | Status: DC

## 2016-05-26 MED ORDER — BENAZEPRIL HCL 20 MG PO TABS: 20.0000 mg | ORAL_TABLET | Freq: Every day | ORAL | Status: DC

## 2016-05-26 MED ORDER — HYDROCHLOROTHIAZIDE 25 MG PO TABS: 25.0000 mg | ORAL_TABLET | Freq: Every day | ORAL | Status: DC

## 2016-05-26 MED ORDER — CLONAZEPAM 0.5 MG PO TABS: 0.5000 mg | ORAL_TABLET | Freq: Two times a day (BID) | ORAL | Status: DC | PRN

## 2016-05-26 MED ORDER — NIFEDIPINE ER OSMOTIC RELEASE 30 MG PO TB24: 30.0000 mg | ORAL_TABLET | Freq: Every day | ORAL | Status: DC

## 2016-05-26 MED ORDER — POTASSIUM 99 MG PO TABS: 1.0000 | ORAL_TABLET | Freq: Every day | ORAL | Status: DC

## 2016-05-26 NOTE — Patient Instructions (Signed)
Stress and Stress Management Stress is a normal reaction to life events. It is what you feel when life demands more than you are used to or more than you can handle. Some stress can be useful. For example, the stress reaction can help you catch the last bus of the day, study for a test, or meet a deadline at work. But stress that occurs too often or for too long can cause problems. It can affect your emotional health and interfere with relationships and normal daily activities. Too much stress can weaken your immune system and increase your risk for physical illness. If you already have a medical problem, stress can make it worse. CAUSES  All sorts of life events may cause stress. An event that causes stress for one person may not be stressful for another person. Major life events commonly cause stress. These may be positive or negative. Examples include losing your job, moving into a new home, getting married, having a baby, or losing a loved one. Less obvious life events may also cause stress, especially if they occur day after day or in combination. Examples include working long hours, driving in traffic, caring for children, being in debt, or being in a difficult relationship. SIGNS AND SYMPTOMS Stress may cause emotional symptoms including, the following:  Anxiety. This is feeling worried, afraid, on edge, overwhelmed, or out of control.  Anger. This is feeling irritated or impatient.  Depression. This is feeling sad, down, helpless, or guilty.  Difficulty focusing, remembering, or making decisions. Stress may cause physical symptoms, including the following:   Aches and pains. These may affect your head, neck, back, stomach, or other areas of your body.  Tight muscles or clenched jaw.  Low energy or trouble sleeping. Stress may cause unhealthy behaviors, including the following:   Eating to feel better (overeating) or skipping meals.  Sleeping too little, too much, or both.  Working  too much or putting off tasks (procrastination).  Smoking, drinking alcohol, or using drugs to feel better. DIAGNOSIS  Stress is diagnosed through an assessment by your health care provider. Your health care provider will ask questions about your symptoms and any stressful life events.Your health care provider will also ask about your medical history and may order blood tests or other tests. Certain medical conditions and medicine can cause physical symptoms similar to stress. Mental illness can cause emotional symptoms and unhealthy behaviors similar to stress. Your health care provider may refer you to a mental health professional for further evaluation.  TREATMENT  Stress management is the recommended treatment for stress.The goals of stress management are reducing stressful life events and coping with stress in healthy ways.  Techniques for reducing stressful life events include the following:  Stress identification. Self-monitor for stress and identify what causes stress for you. These skills may help you to avoid some stressful events.  Time management. Set your priorities, keep a calendar of events, and learn to say "no." These tools can help you avoid making too many commitments. Techniques for coping with stress include the following:  Rethinking the problem. Try to think realistically about stressful events rather than ignoring them or overreacting. Try to find the positives in a stressful situation rather than focusing on the negatives.  Exercise. Physical exercise can release both physical and emotional tension. The key is to find a form of exercise you enjoy and do it regularly.  Relaxation techniques. These relax the body and mind. Examples include yoga, meditation, tai chi, biofeedback, deep  breathing, progressive muscle relaxation, listening to music, being out in nature, journaling, and other hobbies. Again, the key is to find one or more that you enjoy and can do  regularly.  Healthy lifestyle. Eat a balanced diet, get plenty of sleep, and do not smoke. Avoid using alcohol or drugs to relax.  Strong support network. Spend time with family, friends, or other people you enjoy being around.Express your feelings and talk things over with someone you trust. Counseling or talktherapy with a mental health professional may be helpful if you are having difficulty managing stress on your own. Medicine is typically not recommended for the treatment of stress.Talk to your health care provider if you think you need medicine for symptoms of stress. HOME CARE INSTRUCTIONS  Keep all follow-up visits as directed by your health care provider.  Take all medicines as directed by your health care provider. SEEK MEDICAL CARE IF:  Your symptoms get worse or you start having new symptoms.  You feel overwhelmed by your problems and can no longer manage them on your own. SEEK IMMEDIATE MEDICAL CARE IF:  You feel like hurting yourself or someone else.   This information is not intended to replace advice given to you by your health care provider. Make sure you discuss any questions you have with your health care provider.   Document Released: 05/13/2001 Document Revised: 12/08/2014 Document Reviewed: 07/12/2013 Elsevier Interactive Patient Education 2016 [employer]  

## 2016-05-26 NOTE — Progress Notes (Signed)
Subjective:    Patient ID: Joseph [name], male    DOB: [birth year], 75 y.o.   MRN: [id number]  Patient here today for follow up of chronic medical problems.  Outpatient Encounter Prescriptions as of 05/26/2016  Medication Sig  . allopurinol (ZYLOPRIM) 300 MG tablet TAKE ONE TABLET BY MOUTH ONCE DAILY  . aspirin 81 MG tablet Take 81 mg by mouth daily.  Marland Kitchen atorvastatin (LIPITOR) 40 MG tablet Take 1 tablet (40 mg total) by mouth daily.  . benazepril (LOTENSIN) 20 MG tablet Take 1 tablet (20 mg total) by mouth daily.  . clonazePAM (KLONOPIN) 0.5 MG tablet TAKE ONE TABLET BY MOUTH TWICE DAILY AS NEEDED FOR ANXIETY  . docusate sodium (COLACE) 100 MG capsule Take 100 mg by mouth daily as needed for mild constipation.  . fish oil-omega-3 fatty acids 1000 MG capsule Take 2 g by mouth daily.  . hydrochlorothiazide (HYDRODIURIL) 25 MG tablet Take 1 tablet (25 mg total) by mouth daily.  . metoprolol tartrate (LOPRESSOR) 25 MG tablet Take 0.5 tablets (12.5 mg total) by mouth 2 (two) times daily.  . Multiple Vitamins-Minerals (COMPLETE MULTIVITAMIN/MINERAL PO) Take 1 tablet by mouth daily.  Marland Kitchen NIFEdipine (PROCARDIA-XL/ADALAT-CC/NIFEDICAL-XL) 30 MG 24 hr tablet Take 1 tablet (30 mg total) by mouth daily.  . Potassium 99 MG TABS Take 1 tablet (99 mg total) by mouth daily.  . traMADol (ULTRAM) 50 MG tablet TAKE ONE TABLET BY MOUTH EVERY 8 HOURS AS NEEDED  . zolpidem (AMBIEN) 10 MG tablet TAKE ONE TABLET BY MOUTH ONCE DAILY AT BEDTIME AS NEEDED FOR  SLEEP   No facility-administered encounter medications on file as of 05/26/2016.     Hyperlipidemia This is a chronic problem. The current episode started more than 1 year ago. The problem is controlled. Recent lipid tests were reviewed and are normal. He has no history of diabetes, hypothyroidism or obesity. There are no known factors aggravating his hyperlipidemia. Pertinent negatives include no chest pain or shortness of breath. Current antihyperlipidemic  treatment includes statins. The current treatment provides moderate improvement of lipids. Compliance problems include adherence to diet and adherence to exercise.  Risk factors for coronary artery disease include dyslipidemia, hypertension and obesity.  Hypertension This is a chronic problem. The current episode started more than 1 year ago. The problem is unchanged. The problem is controlled. Pertinent negatives include no chest pain, headaches, palpitations or shortness of breath. Risk factors for coronary artery disease include dyslipidemia and male gender. Past treatments include diuretics and ACE inhibitors. The current treatment provides significant improvement. Compliance problems include diet and exercise.   Insomnia Ambien- works well - can't sleep without it BPH Pt voiding without difficulty. He stopped seeing Joseph [name].  GOUT  Usually in his feet- he has not have an attack for some time. Taking allopurinol 335m.  GAD On klonopin and has been taking 2x a day for several weeks now- says that he has been very anxious lately Thoracic aortic aneurysm Being watch by cardiology- no problems that he is aware of. raynauds syndrome Procardia- helps some- still has to sleep in cotton gloves hypokalemia Currently taking potassium supplements  Review of Systems  Constitutional: Negative.   HENT: Negative.   Eyes: Negative.   Respiratory: Negative.  Negative for shortness of breath.   Cardiovascular: Negative.  Negative for chest pain, palpitations and leg swelling.  Gastrointestinal: Negative.   Endocrine: Negative.   Genitourinary: Negative.   Musculoskeletal: Negative.   Skin: Negative.   Allergic/Immunologic: Negative.   Neurological:  Negative.  Negative for headaches.  Hematological: Negative.   Psychiatric/Behavioral: Negative.   All other systems reviewed and are negative.      Objective:   Physical Exam  Constitutional: He is oriented to person, place, and time. Vital signs  are normal. He appears well-developed and well-nourished.  HENT:  Head: Normocephalic.  Right Ear: Hearing, tympanic membrane and external ear normal.  Left Ear: External ear normal.  Nose: Nose normal.  Mouth/Throat: Uvula is midline and oropharynx is clear and moist.  Eyes: Conjunctivae and EOM are normal. Pupils are equal, round, and reactive to light.  Neck: Trachea normal and normal range of motion. Neck supple. No JVD present. No thyromegaly present.  Cardiovascular: Normal rate, regular rhythm, normal heart sounds and intact distal pulses.  Exam reveals no gallop and no friction rub.   No murmur heard. (+) allen test bil hands  Pulmonary/Chest: Effort normal and breath sounds normal. No respiratory distress. He has no wheezes. He has no rales. He exhibits no tenderness.  Abdominal: Soft. Bowel sounds are normal. He exhibits no mass. There is no tenderness.  Musculoskeletal: Normal range of motion. He exhibits no edema.  Lymphadenopathy:    He has no cervical adenopathy.  Neurological: He is alert and oriented to person, place, and time. No cranial nerve deficit.  Skin: Skin is warm, dry and intact.  Psychiatric: He has a normal mood and affect. His speech is normal and behavior is normal. Judgment and thought content normal. Cognition and memory are normal.    BP 127/80 mmHg  Pulse 92  Temp(Src) 98.5 F (36.9 C) (Oral)  Ht _0  (1.727 m)  Wt 260 lb 6.4 oz (118.117 kg)  BMI 39.60 kg/m2       Assessment & Plan:  1. Essential hypertension, benign Do not add salt to diet - benazepril (LOTENSIN) 20 MG tablet; Take 1 tablet (20 mg total) by mouth daily.  Dispense: 90 tablet; Refill: 1 - hydrochlorothiazide (HYDRODIURIL) 25 MG tablet; Take 1 tablet (25 mg total) by mouth daily.  Dispense: 90 tablet; Refill: 3 - metoprolol tartrate (LOPRESSOR) 25 MG tablet; Take 0.5 tablets (12.5 mg total) by mouth 2 (two) times daily.  Dispense: 90 tablet; Refill: 3 - CMP14+EGFR  2. Raynauds  disease - traMADol (ULTRAM) 50 MG tablet; Take 1 tablet (50 mg total) by mouth every 8 (eight) hours as needed.  Dispense: 60 tablet; Refill: 2  3. Thoracic ascending aortic aneurysm ([facility]) - NIFEdipine (PROCARDIA-XL/ADALAT-CC/NIFEDICAL-XL) 30 MG 24 hr tablet; Take 1 tablet (30 mg total) by mouth daily.  Dispense: 90 tablet; Refill: 1  4. BPH (benign prostatic hyperplasia) Follow up with urologist  5. Hyperlipidemia with target LDL less than 100 Low fat diet - atorvastatin (LIPITOR) 40 MG tablet; Take 1 tablet (40 mg total) by mouth daily.  Dispense: 90 tablet; Refill: 3 - Lipid panel  6. Insomnia Bedtime ritual Wants to increase to 1 1/2 daily but would rather him take melatonin nightly with Lorrin Mais for now - zolpidem (AMBIEN) 10 MG tablet; TAKE ONE TABLET BY MOUTH ONCE DAILY AT BEDTIME AS NEEDED FOR  SLEEP  Dispense: 90 tablet; Refill: 1  7. Idiopathic chronic gout of multiple sites without tophus No recent flare ups  8. Morbid obesity, unspecified obesity type ([place]) Discussed diet and exercise for person with BMI >25 Will recheck weight in 3-6 months  9. Hypokalemia - Potassium 99 MG TABS; Take 1 tablet (99 mg total) by mouth daily.  Dispense: 330 each; Refill: 1  10. GAD (generalized anxiety disorder) Stress management - clonazePAM (KLONOPIN) 0.5 MG tablet; Take 1 tablet (0.5 mg total) by mouth 2 (two) times daily as needed. for anxiety  Dispense: 60 tablet; Refill: 2    Labs pending Health maintenance reviewed Diet and exercise encouraged Continue all meds Follow up  In 3 months   [place], FNP

## 2016-05-27 LAB — CMP14+EGFR
ALK PHOS: 62 IU/L (ref 39–117)
ALT: 16 IU/L (ref 0–44)
AST: 19 IU/L (ref 0–40)
Albumin/Globulin Ratio: 1.7 (ref 1.2–2.2)
Albumin: 4 g/dL (ref 3.5–4.8)
BUN/Creatinine Ratio: 14 (ref 10–24)
BUN: 14 mg/dL (ref 8–27)
Bilirubin Total: 0.4 mg/dL (ref 0.0–1.2)
CALCIUM: 9 mg/dL (ref 8.6–10.2)
CO2: 20 mmol/L (ref 18–29)
CREATININE: 0.97 mg/dL (ref 0.76–1.27)
Chloride: 105 mmol/L (ref 96–106)
GFR calc Af Amer: 89 mL/min/{1.73_m2} (ref >59)
GFR, EST NON AFRICAN AMERICAN: 77 mL/min/{1.73_m2} (ref >59)
GLOBULIN, TOTAL: 2.3 g/dL (ref 1.5–4.5)
GLUCOSE: 102 mg/dL — AB (ref 65–99)
Potassium: 3.6 mmol/L (ref 3.5–5.2)
Sodium: 144 mmol/L (ref 134–144)
Total Protein: 6.3 g/dL (ref 6.0–8.5)

## 2016-05-27 LAB — LIPID PANEL
CHOLESTEROL TOTAL: 123 mg/dL (ref 100–199)
Chol/HDL Ratio: 3.1 ratio units (ref 0.0–5.0)
HDL: 40 mg/dL (ref >39)
LDL Calculated: 62 mg/dL (ref 0–99)
TRIGLYCERIDES: 103 mg/dL (ref 0–149)
VLDL CHOLESTEROL CAL: 21 mg/dL (ref 5–40)

## 2016-07-03 ENCOUNTER — Other Ambulatory Visit: Payer: Self-pay | Admitting: Nurse Practitioner

## 2016-07-28 ENCOUNTER — Other Ambulatory Visit: Payer: Self-pay | Admitting: *Deleted

## 2016-07-28 DIAGNOSIS — I7121 Aneurysm of the ascending aorta, without rupture: Secondary | ICD-10-CM

## 2016-07-28 DIAGNOSIS — I712 Thoracic aortic aneurysm, without rupture: Secondary | ICD-10-CM

## 2016-07-28 NOTE — Progress Notes (Signed)
Cardiology Office Note  Date: 07/29/2016   ID: Joseph, [name] [birth year], MRN [id number]  PCP: Joseph Pretty, FNP  Primary Cardiologist: Joseph Lesches, MD   Chief Complaint  Patient presents with  . Coronary atherosclerosis  . Ascending thoracic aortic aneurysm    History of Present Illness: Joseph [name] is a 75 y.o. male last seen in August 2016. He presents for a routine follow-up visit. He does not report any significant angina symptoms or change in stamina. I reviewed his ECG today which shows sinus rhythm with low voltage and decreased R wave progression with nonspecific T-wave changes.  Follow-up with Dr. Servando [name] noted in January of this year. He had stable-appearing 4.8 cm ascending aortic aneurysm at that time. Reimaging is planned later this year.  We discussed his medications which are outlined below. Blood pressure is adequately controlled on current regimen.  Past Medical History:  Diagnosis Date  . Anxiety   . Ascending aortic aneurysm ([place])   . Essential hypertension   . GERD (gastroesophageal reflux disease)   . Gout   . History of kidney stones   . Insomnia     Current Outpatient Prescriptions  Medication Sig Dispense Refill  . allopurinol (ZYLOPRIM) 300 MG tablet TAKE ONE TABLET BY MOUTH ONCE DAILY 90 tablet 0  . aspirin 81 MG tablet Take 81 mg by mouth daily.    Marland Kitchen atorvastatin (LIPITOR) 40 MG tablet Take 1 tablet (40 mg total) by mouth daily. 90 tablet 3  . benazepril (LOTENSIN) 20 MG tablet Take 1 tablet (20 mg total) by mouth daily. 90 tablet 1  . clonazePAM (KLONOPIN) 0.5 MG tablet Take 1 tablet (0.5 mg total) by mouth 2 (two) times daily as needed. for anxiety 60 tablet 2  . docusate sodium (COLACE) 100 MG capsule Take 100 mg by mouth daily as needed for mild constipation.    . fish oil-omega-3 fatty acids 1000 MG capsule Take 2 g by mouth daily.    . hydrochlorothiazide (HYDRODIURIL) 25 MG tablet Take 1 tablet (25 mg total) by mouth  daily. 90 tablet 3  . metoprolol tartrate (LOPRESSOR) 25 MG tablet Take 0.5 tablets (12.5 mg total) by mouth 2 (two) times daily. 90 tablet 3  . Multiple Vitamins-Minerals (COMPLETE MULTIVITAMIN/MINERAL PO) Take 1 tablet by mouth daily.    Marland Kitchen NIFEdipine (PROCARDIA-XL/ADALAT-CC/NIFEDICAL-XL) 30 MG 24 hr tablet Take 1 tablet (30 mg total) by mouth daily. 90 tablet 1  . Potassium 99 MG TABS Take 1 tablet (99 mg total) by mouth daily. 330 each 1  . traMADol (ULTRAM) 50 MG tablet Take 1 tablet (50 mg total) by mouth every 8 (eight) hours as needed. 60 tablet 2  . zolpidem (AMBIEN) 10 MG tablet TAKE ONE TABLET BY MOUTH ONCE DAILY AT BEDTIME AS NEEDED FOR  SLEEP 90 tablet 1   No current facility-administered medications for this visit.    Allergies:  Carafate [sucralfate]; Ciprofloxacin hcl; Prevacid [lansoprazole]; and Sulfa antibiotics   Social History: The patient  reports that he quit smoking about 58 years ago. His smoking use included Cigarettes. He has a 0.50 pack-year smoking history. He has quit using smokeless tobacco. His smokeless tobacco use included Chew. He reports that he does not drink alcohol or use drugs.   ROS:  Please see the history of present illness. Otherwise, complete review of systems is positive for pseudo-claudication when standing suggesting lumbar disc disease or spinal stenosis.  All other systems are reviewed and negative.   Physical  Exam: VS:  BP 130/78   Pulse 72   Ht 5\' 8"  (1.727 m)   Wt 265 lb (120.2 kg)   SpO2 97%   BMI 40.29 kg/m , BMI Body mass index is 40.29 kg/m.  Wt Readings from Last 3 Encounters:  07/29/16 265 lb (120.2 kg)  05/26/16 260 lb 6.4 oz (118.1 kg)  02/11/16 260 lb (117.9 kg)    General: Obese male, appears comfortable at rest. HEENT: Conjunctiva and lids normal, oropharynx clear. Neck: Supple, increased girth without obvious elevated JVP or carotid bruits, no thyromegaly. Lungs: Clear to auscultation, nonlabored breathing at  rest. Cardiac: Regular rate and rhythm, no S3 or significant systolic murmur, no pericardial rub. Abdomen: Soft, nontender, protuberant, bowel sounds present. Extremities: 1+ leg edema with venous varicosities, distal pulses 2+.  ECG: I personally reviewed the tracing from 06/22/2015 which showed normal sinus rhythm with decreased R wave progression.  Recent Labwork: 05/26/2016: ALT 16; AST 19; BUN 14; Creatinine, Ser 0.97; Potassium 3.6; Sodium 144     Component Value Date/Time   CHOL 123 05/26/2016 1325   CHOL 208 (H) 03/22/2013 0928   TRIG 103 05/26/2016 1325   TRIG 142 04/18/2015 0942   TRIG 163 (H) 03/22/2013 0928   HDL 40 05/26/2016 1325   HDL 43 04/18/2015 0942   HDL 39 (L) 03/22/2013 0928   CHOLHDL 3.1 05/26/2016 1325   LDLCALC 62 05/26/2016 1325   LDLCALC 65 07/10/2014 0936   LDLCALC 136 (H) 03/22/2013 0928    Other Studies Reviewed Today:  Cardiac catheterization 06/27/2015:  Mid LAD lesion, 25% stenosed.  Mid RCA lesion, 35% stenosed.  There is mild left ventricular systolic dysfunction.   Mild LV dysfunction with an ejection fraction of 45-50% with a subtle region of mild mid inferior hypocontractility.  Mild nonobstructive 2 vessel CAD with 25% smooth stenosis in the LAD immediately after the takeoff of the first diagonal vessel; normal left circumflex artery; dominant right coronary artery with smooth 30-40% mid stenosis.  Assessment and Plan:  1. Mild coronary atherosclerosis documented at cardiac catheterization in 2016. He does not report any angina symptoms. Would continue medical therapy and observation, current regimen including aspirin, statin, ACE inhibitor, and beta blocker.  2. Ascending thoracic aortic aneurysm as outlined above, stable by imaging in January at 4.8 cm. Keep follow-up with Dr. Servando [name].  3. Morbid obesity. Weight loss encouraged.  Current medicines were reviewed with the patient today.   Orders Placed This Encounter  Procedures   . EKG 12-Lead    Disposition: Follow-up with me in one year.  Signed, Joseph Sark, MD, [facility] 07/29/2016 10:56 AM    [facility] [place], [place], [place] [address] Phone: [phone]; Fax: [phone]

## 2016-07-29 ENCOUNTER — Encounter: Payer: Self-pay | Admitting: Cardiology

## 2016-07-29 ENCOUNTER — Ambulatory Visit (INDEPENDENT_AMBULATORY_CARE_PROVIDER_SITE_OTHER): Payer: Commercial Managed Care - HMO | Admitting: Cardiology

## 2016-07-29 VITALS — BP 130/78 | HR 72 | Ht 68.0 in | Wt 265.0 lb

## 2016-07-29 DIAGNOSIS — I10 Essential (primary) hypertension: Secondary | ICD-10-CM

## 2016-07-29 DIAGNOSIS — I7121 Aneurysm of the ascending aorta, without rupture: Secondary | ICD-10-CM

## 2016-07-29 DIAGNOSIS — I2510 Atherosclerotic heart disease of native coronary artery without angina pectoris: Secondary | ICD-10-CM | POA: Diagnosis not present

## 2016-07-29 DIAGNOSIS — I712 Thoracic aortic aneurysm, without rupture: Secondary | ICD-10-CM | POA: Diagnosis not present

## 2016-07-29 NOTE — Patient Instructions (Signed)

## 2016-08-26 ENCOUNTER — Other Ambulatory Visit: Payer: Self-pay | Admitting: Cardiothoracic Surgery

## 2016-08-28 ENCOUNTER — Ambulatory Visit: Payer: Self-pay | Admitting: Cardiothoracic Surgery

## 2016-08-28 ENCOUNTER — Ambulatory Visit
Admission: RE | Admit: 2016-08-28 | Discharge: 2016-08-28 | Disposition: A | Discharge status: Home / Self Care | Payer: Commercial Managed Care - HMO | Source: Ambulatory Visit | Attending: Cardiothoracic Surgery | Admitting: Cardiothoracic Surgery

## 2016-08-28 ENCOUNTER — Encounter: Payer: Self-pay | Admitting: Nurse Practitioner

## 2016-08-28 ENCOUNTER — Ambulatory Visit (INDEPENDENT_AMBULATORY_CARE_PROVIDER_SITE_OTHER): Payer: Commercial Managed Care - HMO | Admitting: Nurse Practitioner

## 2016-08-28 ENCOUNTER — Ambulatory Visit (INDEPENDENT_AMBULATORY_CARE_PROVIDER_SITE_OTHER): Payer: Commercial Managed Care - HMO | Admitting: Cardiothoracic Surgery

## 2016-08-28 ENCOUNTER — Encounter: Payer: Self-pay | Admitting: Cardiothoracic Surgery

## 2016-08-28 VITALS — BP 114/66 | HR 80 | Temp 97.2°F | Ht 68.0 in | Wt 247.0 lb

## 2016-08-28 VITALS — BP 120/72 | HR 75 | Resp 20 | Ht 68.0 in | Wt 247.0 lb

## 2016-08-28 DIAGNOSIS — Z23 Encounter for immunization: Secondary | ICD-10-CM

## 2016-08-28 DIAGNOSIS — I7121 Aneurysm of the ascending aorta, without rupture: Secondary | ICD-10-CM

## 2016-08-28 DIAGNOSIS — I10 Essential (primary) hypertension: Secondary | ICD-10-CM

## 2016-08-28 DIAGNOSIS — N400 Enlarged prostate without lower urinary tract symptoms: Secondary | ICD-10-CM

## 2016-08-28 DIAGNOSIS — G4700 Insomnia, unspecified: Secondary | ICD-10-CM

## 2016-08-28 DIAGNOSIS — I712 Thoracic aortic aneurysm, without rupture, unspecified: Secondary | ICD-10-CM

## 2016-08-28 DIAGNOSIS — E876 Hypokalemia: Secondary | ICD-10-CM

## 2016-08-28 DIAGNOSIS — I8393 Asymptomatic varicose veins of bilateral lower extremities: Secondary | ICD-10-CM

## 2016-08-28 DIAGNOSIS — E785 Hyperlipidemia, unspecified: Secondary | ICD-10-CM

## 2016-08-28 DIAGNOSIS — I7300 Raynaud's syndrome without gangrene: Secondary | ICD-10-CM

## 2016-08-28 DIAGNOSIS — M1A09X0 Idiopathic chronic gout, multiple sites, without tophus (tophi): Secondary | ICD-10-CM

## 2016-08-28 MED ORDER — TRAMADOL HCL 50 MG PO TABS: 50.0000 mg | ORAL_TABLET | Freq: Three times a day (TID) | ORAL | 2 refills | Status: DC | PRN

## 2016-08-28 MED ORDER — IOPAMIDOL (ISOVUE-370) INJECTION 76%
75.0000 mL | Freq: Once | INTRAVENOUS | Status: AC | PRN
  Administered 2016-08-28: 75 mL via INTRAVENOUS

## 2016-08-28 NOTE — Progress Notes (Signed)
Subjective:    Patient ID: Joseph [name], male    DOB: 1940/12/11, 75 y.o.   MRN: [id number]  Patient here today for follow up of chronic medical problems. No changes since last visit. No complaints today   Outpatient Encounter Prescriptions as of 08/28/2016  Medication Sig  . allopurinol (ZYLOPRIM) 300 MG tablet TAKE ONE TABLET BY MOUTH ONCE DAILY  . aspirin 81 MG tablet Take 81 mg by mouth daily.  Marland Kitchen atorvastatin (LIPITOR) 40 MG tablet Take 1 tablet (40 mg total) by mouth daily.  . benazepril (LOTENSIN) 20 MG tablet Take 1 tablet (20 mg total) by mouth daily.  . clonazePAM (KLONOPIN) 0.5 MG tablet Take 1 tablet (0.5 mg total) by mouth 2 (two) times daily as needed. for anxiety  . docusate sodium (COLACE) 100 MG capsule Take 100 mg by mouth daily as needed for mild constipation.  . fish oil-omega-3 fatty acids 1000 MG capsule Take 2 g by mouth daily.  . hydrochlorothiazide (HYDRODIURIL) 25 MG tablet Take 1 tablet (25 mg total) by mouth daily.  . metoprolol tartrate (LOPRESSOR) 25 MG tablet Take 0.5 tablets (12.5 mg total) by mouth 2 (two) times daily.  . Multiple Vitamins-Minerals (COMPLETE MULTIVITAMIN/MINERAL PO) Take 1 tablet by mouth daily.  Marland Kitchen NIFEdipine (PROCARDIA-XL/ADALAT-CC/NIFEDICAL-XL) 30 MG 24 hr tablet Take 1 tablet (30 mg total) by mouth daily.  . Potassium 99 MG TABS Take 1 tablet (99 mg total) by mouth daily.  . traMADol (ULTRAM) 50 MG tablet Take 1 tablet (50 mg total) by mouth every 8 (eight) hours as needed.  . zolpidem (AMBIEN) 10 MG tablet TAKE ONE TABLET BY MOUTH ONCE DAILY AT BEDTIME AS NEEDED FOR  SLEEP   No facility-administered encounter medications on file as of 08/28/2016.      Hyperlipidemia  This is a chronic problem. The current episode started more than 1 year ago. The problem is controlled. Recent lipid tests were reviewed and are normal. He has no history of diabetes, hypothyroidism or obesity. There are no known factors aggravating his hyperlipidemia.  Pertinent negatives include no chest pain or shortness of breath. Current antihyperlipidemic treatment includes statins. The current treatment provides moderate improvement of lipids. Compliance problems include adherence to diet and adherence to exercise.  Risk factors for coronary artery disease include dyslipidemia, hypertension and obesity.  Hypertension  This is a chronic problem. The current episode started more than 1 year ago. The problem is unchanged. The problem is controlled. Pertinent negatives include no chest pain, headaches, palpitations or shortness of breath. Risk factors for coronary artery disease include dyslipidemia and male gender. Past treatments include diuretics and ACE inhibitors. The current treatment provides significant improvement. Compliance problems include diet and exercise.   Insomnia Ambien- works well - can't sleep without it BPH Pt voiding without difficulty. He stopped seeing Carmelina Noun.  GOUT  Usually in his feet- he has not have an attack for some time. Taking allopurinol 362m.  GAD On klonopin and has been taking 2x a day for several weeks now- says that he has been very anxious lately Thoracic aortic aneurysm Being watch by cardiology- no problems that he is aware of. He is having x ray done this afternoon for evaluation. raynauds syndrome Procardia- helps some- still has to sleep in cotton gloves hypokalemia Currently taking potassium supplements  Review of Systems  Constitutional: Negative.   HENT: Negative.   Eyes: Negative.   Respiratory: Negative.  Negative for shortness of breath.   Cardiovascular: Negative.  Negative for chest  pain, palpitations and leg swelling.  Gastrointestinal: Negative.   Endocrine: Negative.   Genitourinary: Negative.   Musculoskeletal: Negative.   Skin: Negative.   Allergic/Immunologic: Negative.   Neurological: Negative.  Negative for headaches.  Hematological: Negative.   Psychiatric/Behavioral: Negative.   All  other systems reviewed and are negative.      Objective:   Physical Exam  Constitutional: He is oriented to person, place, and time. Vital signs are normal. He appears well-developed and well-nourished.  HENT:  Head: Normocephalic.  Right Ear: Hearing, tympanic membrane and external ear normal.  Left Ear: External ear normal.  Nose: Nose normal.  Mouth/Throat: Uvula is midline and oropharynx is clear and moist.  Eyes: Conjunctivae and EOM are normal. Pupils are equal, round, and reactive to light.  Neck: Trachea normal and normal range of motion. Neck supple. No JVD present. No thyromegaly present.  Cardiovascular: Normal rate, regular rhythm, normal heart sounds and intact distal pulses.  Exam reveals no gallop and no friction rub.   No murmur heard. (+) allen test bil hands  Pulmonary/Chest: Effort normal and breath sounds normal. No respiratory distress. He has no wheezes. He has no rales. He exhibits no tenderness.  Abdominal: Soft. Bowel sounds are normal. He exhibits no mass. There is no tenderness.  Musculoskeletal: Normal range of motion. He exhibits no edema.  Lymphadenopathy:    He has no cervical adenopathy.  Neurological: He is alert and oriented to person, place, and time. No cranial nerve deficit.  Skin: Skin is warm, dry and intact.  Psychiatric: He has a normal mood and affect. His speech is normal and behavior is normal. Judgment and thought content normal. Cognition and memory are normal.    BP 114/66   Pulse 80   Temp 97.2 F (36.2 C) (Oral)   Ht _0  (1.727 m)   Wt 247 lb (112 kg)   BMI 37.56 kg/m        Assessment & Plan:  1. Raynauds disease Keep hands and feet warm - traMADol (ULTRAM) 50 MG tablet; Take 1 tablet (50 mg total) by mouth every 8 (eight) hours as needed.  Dispense: 60 tablet; Refill: 2  2. Essential hypertension, benign Do not add salt to diet - CMP14+EGFR  3. BPH (benign prostatic hyperplasia)  4. Hyperlipidemia with target LDL  less than 100 Low fta diet - Lipid panel  5. Thoracic ascending aortic aneurysm [facility]) Keep follow up appointment with cardiologist  6. Varicose veins of both lower extremities Compression socks   7. Idiopathic chronic gout of multiple sites without tophus  8. Hypokalemia  9. Insomnia Bedtime routine  10. Morbid obesity, unspecified obesity type ([place]) Discussed diet and exercise for person with BMI >25 Will recheck weight in 3-6 months    Labs pending Health maintenance reviewed Diet and exercise encouraged Continue all meds Follow up  In 3 months   [place], FNP

## 2016-08-28 NOTE — Patient Instructions (Signed)
Varicose Veins Varicose veins are veins that have become enlarged and twisted. They are usually seen in the legs but can occur in other parts of the body as well. CAUSES This condition is the result of valves in the veins not working properly. Valves in the veins help to return blood from the leg to the heart. If these valves are damaged, blood flows backward and backs up into the veins in the leg near the skin. This causes the veins to become larger. RISK FACTORS People who are on their feet a lot, who are pregnant, or who are overweight are more likely to develop varicose veins. SIGNS AND SYMPTOMS  Bulging, twisted-appearing, bluish veins, most commonly found on the legs.  Leg pain or a feeling of heaviness. These symptoms may be worse at the end of the day.  Leg swelling.  Changes in skin color. DIAGNOSIS A health care provider can usually diagnose varicose veins by examining your legs. Your health care provider may also recommend an ultrasound of your leg veins. TREATMENT Most varicose veins can be treated at home.However, other treatments are available for people who have persistent symptoms or want to improve the cosmetic appearance of the varicose veins. These treatment options include:  Sclerotherapy. A solution is injected into the vein to close it off.  Laser treatment. A laser is used to heat the vein to close it off.  Radiofrequency vein ablation. An electrical current produced by radio waves is used to close off the vein.  Phlebectomy. The vein is surgically removed through small incisions made over the varicose vein.  Vein ligation and stripping. The vein is surgically removed through incisions made over the varicose vein after the vein has been tied (ligated). HOME CARE INSTRUCTIONS  Do not stand or sit in one position for long periods of time. Do not sit with your legs crossed. Rest with your legs raised during the day.  Wear compression stockings as directed by your  health care provider. These stockings help to prevent blood clots and reduce swelling in your legs.  Do not wear other tight, encircling garments around your legs, pelvis, or waist.  Walk as much as possible to increase blood flow.  Raise the foot of your bed at night with 2-inch blocks.  If you get a cut in the skin over the vein and the vein bleeds, lie down with your leg raised and press on it with a clean cloth until the bleeding stops. Then place a bandage (dressing) on the cut. See your health care provider if it continues to bleed. SEEK MEDICAL CARE IF:  The skin around your ankle starts to break down.  You have pain, redness, tenderness, or hard swelling in your leg over a vein.  You are uncomfortable because of leg pain.   This information is not intended to replace advice given to you by your health care provider. Make sure you discuss any questions you have with your health care provider.   Document Released: 08/27/2005 Document Revised: 12/08/2014 Document Reviewed: 04/04/2014 [employer] Interactive Patient Education 2016 [employer]  

## 2016-08-28 NOTE — Progress Notes (Signed)
[address]       [place] [address]             [id number]                    Wayman F Montavon Moberly Medical Record [id number] Date of Birth: January 09, 1941  Referring: Hinda Lenis Primary Care: Chevis Pretty, FNP  Chief Complaint:    Chief Complaint  Patient presents with  . Thoracic Aortic Aneurysm    9 month f/u with CTA Chest     History of Present Illness:    Joseph [name] 75 y.o. male is seen in the office in follow-up after a recent admission to [facility]. The patient notes that on June 04 2015 holiday he developed epigastric and substernal chest pain radiating to the left neck into both arms, with numbness in his hands. At the time he was camping at [facility] and went to the [facility] emergency room and was admitted. He was hospitalized for several days. No cardiac catheterization was performed. I do not have any results of cardiac enzymes. The patient did have a CT scan of the chest which revealed a 4.8 cm dilatation of the ascending aorta. On discharge the patient was told to see a thoracic surgeon. He comes to the office today at the urging of Tiffany Koos PA, at medical practice in [place].   Since last seen he is had no further complaints, he does do light yard work without chest pain. Primarily he has been caring for his wife who has bilateral lower extremity wounds.   He comes in today with a follow-up CT scan of the chest to evaluate size of his ascending aorta.    Conclusion     Mid LAD lesion, 25% stenosed.  Mid RCA lesion, 35% stenosed.  There is mild left ventricular systolic dysfunction.  Mild LV dysfunction with an ejection fraction of 45-50% with a subtle region of mild mid inferior hypocontractility.  Mild nonobstructive 2 vessel CAD with 25% smooth stenosis in the LAD immediately after the takeoff of the first diagonal vessel; normal left circumflex artery; dominant right coronary artery with smooth  30-40% mid stenosis.  RECOMMENDATION:  Medical therapy.      Current Activity/ Functional Status:  Patient is independent with mobility/ambulation, transfers, ADL's, IADL's.   Zubrod Score: At the time of surgery this patient's most appropriate activity status/level should be described as: []     0    Normal activity, no symptoms [x]     1    Restricted in physical strenuous activity but ambulatory, able to do out light work []     2    Ambulatory and capable of self care, unable to do work activities, up and about               >50 % of waking hours                              []     3    Only limited self care, in bed greater than 50% of waking hours []     4    Completely disabled, no self care, confined to bed or chair []     5    Moribund   Past Medical History:  Diagnosis Date  . Anxiety   . Ascending aortic aneurysm ([place])   . Essential hypertension   .  GERD (gastroesophageal reflux disease)   . Gout   . History of kidney stones   . Insomnia     Past Surgical History:  Procedure Laterality Date  . CARDIAC CATHETERIZATION N/A 06/27/2015   Procedure: Left Heart Cath and Coronary Angiography;  Surgeon: Troy Sine, MD;  Location: [facility];  Service: Cardiovascular;  Laterality: N/A;  . HERNIA REPAIR    . KIDNEY STONE SURGERY      Family History  Problem Relation Age of Onset  . Heart disease Father   . Diabetes Sister   . Heart disease Brother   . Deep vein thrombosis Brother   . Congestive Heart Failure Brother    patient has history of sudden death of his father at age 35 and his brother at age 64-  these were attributed to myocardial infarctions but there is no documented autopsy . The patient notes both of them died at home suddenly    Social History   Social History  . Marital status: Married    Spouse name: N/A  . Number of children: N/A  . Years of education: N/A   Occupational History  . Not on file.   Social History Main Topics  .  Smoking status: Former Smoker    Packs/day: 0.50    Years: 1.00    Types: Cigarettes    Quit date: 03/22/1958  . Smokeless tobacco: Former Systems developer    Types: Chew     Comment: [place]  . Alcohol use No  . Drug use: No  . Sexual activity: No   Other Topics Concern  . Not on file   Social History Narrative  . No narrative on file    History  Smoking Status  . Former Smoker  . Packs/day: 0.50  . Years: 1.00  . Types: Cigarettes  . Quit date: 03/22/1958  Smokeless Tobacco  . Former Systems developer  . Types: Chew    Comment: QUIT CHEWING IN 1989    History  Alcohol Use No     Allergies  Allergen Reactions  . Carafate [Sucralfate] Other (See Comments)    TOOK SKIN FROM HIS MOUTH  . Ciprofloxacin Hcl Other (See Comments)    CAN'T REMEMBER  . Prevacid [Lansoprazole] Other (See Comments)    MADE HIM FEEL BAD  . Sulfa Antibiotics Diarrhea    SEVERE    Current Outpatient Prescriptions  Medication Sig Dispense Refill  . allopurinol (ZYLOPRIM) 300 MG tablet TAKE ONE TABLET BY MOUTH ONCE DAILY 90 tablet 0  . aspirin 81 MG tablet Take 81 mg by mouth daily.    Marland Kitchen atorvastatin (LIPITOR) 40 MG tablet Take 1 tablet (40 mg total) by mouth daily. 90 tablet 3  . benazepril (LOTENSIN) 20 MG tablet Take 1 tablet (20 mg total) by mouth daily. 90 tablet 1  . clonazePAM (KLONOPIN) 0.5 MG tablet Take 1 tablet (0.5 mg total) by mouth 2 (two) times daily as needed. for anxiety 60 tablet 2  . docusate sodium (COLACE) 100 MG capsule Take 100 mg by mouth daily as needed for mild constipation.    . fish oil-omega-3 fatty acids 1000 MG capsule Take 2 g by mouth daily.    . hydrochlorothiazide (HYDRODIURIL) 25 MG tablet Take 1 tablet (25 mg total) by mouth daily. 90 tablet 3  . metoprolol tartrate (LOPRESSOR) 25 MG tablet Take 0.5 tablets (12.5 mg total) by mouth 2 (two) times daily. 90 tablet 3  . Multiple Vitamins-Minerals (COMPLETE MULTIVITAMIN/MINERAL PO) Take  1 tablet by mouth daily.    Marland Kitchen  NIFEdipine (PROCARDIA-XL/ADALAT-CC/NIFEDICAL-XL) 30 MG 24 hr tablet Take 1 tablet (30 mg total) by mouth daily. 90 tablet 1  . Potassium 99 MG TABS Take 1 tablet (99 mg total) by mouth daily. 330 each 1  . traMADol (ULTRAM) 50 MG tablet Take 1 tablet (50 mg total) by mouth every 8 (eight) hours as needed. 60 tablet 2  . zolpidem (AMBIEN) 10 MG tablet TAKE ONE TABLET BY MOUTH ONCE DAILY AT BEDTIME AS NEEDED FOR  SLEEP 90 tablet 1   No current facility-administered medications for this visit.       Review of Systems:     Cardiac Review of Systems: Y or N  Chest Pain [  y  ]  Resting SOB [ y  ] Exertional SOB  Blue.Reese  ]  Orthopnea Blue.Reese  ]   Pedal Edema Blue.Reese   ]    Palpitations [ n ] Syncope  [n  ]   Presyncope [  y ]  General Review of Systems: [Y] = yes [  ]=no Constitional: recent weight change [  ];  Wt loss over the last 3 months [   ] anorexia [  ]; fatigue Blue.Reese  ]; nausea [  ]; night sweats [n  ]; fever [  ]; or chills [  ];          Dental: poor dentition[  ]; Last Dentist visit:   Eye : blurred vision [  ]; diplopia [   ]; vision changes [  ];  Amaurosis fugax[  ]; Resp: cough [  ];  wheezing[  ];  hemoptysis[  ]; shortness of breathy[  ]; paroxysmal nocturnal dyspnea[  ]; dyspnea on exertion[  ]; or orthopnea[  ];  GI:  gallstones[  ], vomiting[  ];  dysphagia[  ]; melena[  ];  hematochezia [  ]; heartburn[  ];   Hx of  Colonoscopy[ pylops removed ?10 years ago ]; GU: kidney stones [  ]; hematuria[  ];   dysuria [  ];  nocturia[  ];  history of     obstruction [  ]; urinary frequency [  ]             Skin: rash, swelling[  ];, hair loss[  ];  peripheral edema[  ];  or itching[  ]; Musculosketetal: myalgias[  ];  joint swelling[  ];  joint erythema[  ];  joint pain[  ];  back pain[  ];  Heme/Lymph: bruising[  ];  bleeding[  ];  anemia[  ];  Neuro: TIA[n  ];  headaches[ n ];  stroke[ n ];  vertigo[  ];  seizures[  ];   paresthesias[  ];  difficulty walking[  ];  Psych:depression[  ]; anxiety[   ];  Endocrine: diabetes[ n ];  thyroid dysfunction[n  ];  Immunizations: Flu up to date Blue.Reese  ]; Pneumococcal up to date Blue.Reese  ];  Other:  Physical Exam: BP 120/72 (BP Location: Right Arm, Patient Position: Sitting, Cuff Size: Large)   Pulse 75   Resp 20   Ht 5\' 8"  (1.727 m)   Wt 247 lb (112 kg)   SpO2 97% Comment: RA  BMI 37.56 kg/m   PHYSICAL EXAMINATION: General appearance: alert, cooperative, appears older than stated age, no distress and morbidly obese Head: Normocephalic, without obvious abnormality, atraumatic Neck: no adenopathy, no carotid bruit, no JVD, supple, symmetrical, trachea midline and thyroid not enlarged, symmetric,  no tenderness/mass/nodules Lymph nodes: Cervical, supraclavicular, and axillary nodes normal. Resp: clear to auscultation bilaterally Back: symmetric, no curvature. ROM normal. No CVA tenderness. 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   Diagnostic Studies & Laboratory data:     Recent Radiology Findings:  Ct Angio Chest Aorta W &/or Wo Contrast  Result Date: 08/28/2016 CLINICAL DATA:  Thoracic ascending aortic aneurysm.  Follow-up. EXAM: CT ANGIOGRAPHY CHEST WITH CONTRAST TECHNIQUE: Multidetector CT imaging of the chest was performed using the standard protocol during bolus administration of intravenous contrast. Multiplanar CT image reconstructions and MIPs were obtained to evaluate the vascular anatomy. Creatinine was obtained on site at [facility] at [address] Results: Creatinine 1.0 mg/dL. CONTRAST:  75 mL  Isovue 370 COMPARISON:  12/20/2015 FINDINGS: Cardiovascular: Ascending thoracic aorta near the sinotubular junction measures roughly 4.2 cm and unchanged. The mid ascending thoracic aorta measures up to 4.9 cm and stable. Proximal aortic arch measures 3.7 cm and stable. Distal aortic arch measures 3.2 cm and stable. The mid ascending thoracic aorta measures 2.9 cm and stable. 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 Overall, there is mild atherosclerotic disease throughout the thoracic aorta. There is also focal plaque at the 4 o'clock position of the aorta at the hiatus on image 104. Celiac trunk, proximal SMA and bilateral renal arteries are patent. There are coronary artery calcifications. Normal caliber of the main pulmonary arteries. Negative for thoracic aortic dissection. Mediastinum/Nodes: No chest lymphadenopathy. Lungs/Pleura: Trachea and mainstem bronchi are patent. Stable scarring in the right lower lobe and along the right major fissure. Otherwise, the lungs are clear. No pleural effusions. Upper Abdomen: Stable 2.5 cm low-density structure in the central aspect of liver probably represents a cyst. There is an exophytic right renal cyst. Normal appearance of the adrenal glands. Small stone in the left kidney without hydronephrosis. Probable small left renal cysts. Musculoskeletal: No chest wall abnormality. No acute or significant osseous findings. Review of the MIP images confirms the above findings. IMPRESSION: Stable fusiform aneurysm of the ascending thoracic aorta measuring up to 4.9 cm. Recommend semi-annual imaging followup by CTA or MRA and referral to cardiothoracic surgery if not already obtained. This recommendation follows 2010 ACCF/AHA/AATS/ACR/ASA/SCA/SCAI/SIR/STS/SVM Guidelines for the Diagnosis and Management of Patients With Thoracic Aortic Disease. Circulation. 2010; 121: [id number] No acute chest abnormality.  Electronically Signed   By: Markus Daft M.D.   On: 08/28/2016 14:05       Recent Lab Findings: Lab Results  Component Value Date   WBC 8.1 06/27/2015   HGB 13.2 06/27/2015   HCT 39.1 06/27/2015   PLT 182 06/27/2015   GLUCOSE 102 (H) 05/26/2016   CHOL 123 05/26/2016   TRIG 103 05/26/2016   HDL 40 05/26/2016   LDLCALC 62 05/26/2016   ALT 16 05/26/2016   AST 19 05/26/2016   NA 144 05/26/2016   K 3.6 05/26/2016   CL 105 05/26/2016   CREATININE 0.97 05/26/2016   BUN 14 05/26/2016   CO2 20 05/26/2016   INR 1.08  06/27/2015   Ct Angio Chest Aorta W/cm &/or Wo/cm  12/20/2015  CLINICAL DATA:  TAA. Occ chest pain. 10# wt loss. Nonsmoker. No prev surg or hx ca. Prev Ct 2002 purged, report available. EXAM: CT ANGIOGRAPHY CHEST WITH CONTRAST TECHNIQUE: Multidetector CT imaging of the chest was performed using the standard protocol during bolus administration of intravenous contrast. Multiplanar CT image reconstructions and MIPs were obtained to evaluate the vascular anatomy. CONTRAST:  75 cc Isovue 370 COMPARISON:  Report of previous CT exam 07/06/2001, CT of the chest 06/04/2015 from [place]: Heart: Coronary artery calcification is present. Trace pericardial effusion. Heart size is normal. Vascular structures: Ascending aortic aneurysm is 4.8 cm. No dissection. Moderate atherosclerotic calcification of the thoracic aorta. Moderate atherosclerotic calcification of the descending aorta, particularly at the level of the diaphragmatic hiatus. Mediastinum/thyroid: Left thyroid nodule measures 1.0 cm. No significant mediastinal, hilar, or axillary adenopathy. Lungs/Airways: No pulmonary nodules, pleural effusions, or infiltrates. Upper abdomen: Hypervascular 2.3 cm lesion identified at the dome of the right hepatic lobe, stable in appearance and consistent with benign hemangioma. A stable cyst is seen in the lateral segment of the left hepatic lobe measuring 2.4 cm. Findings are  consistent with benign cyst. Small splenules are incidentally noted in the left upper quadrant the abdomen. Small hiatal hernia. Chest wall/osseous structures: Mild spondylosis of the thoracic spine. No suspicious lytic or blastic lesions are identified. Review of the MIP images confirms the above findings. IMPRESSION: 1. Ascending aortic aneurysm, 4.8 cm. Appearance is stable compared with previous CT of the chest from July 2016. Recommend semi-annual imaging followup by CTA or MRA and referral to cardiothoracic surgery if not already obtained. This recommendation follows 2010 ACCF/AHA/AATS/ACR/ASA/SCA/SCAI/SIR/STS/SVM Guidelines for the Diagnosis and Management of Patients With Thoracic Aortic Disease. Circulation. 2010; 121: [id number] 2. Coronary artery disease. 3. Trace pericardial effusion. 4. Benign appearing left thyroid nodule. No further evaluation is felt to be necessary based on consensus criteria. 5. Stable benign liver lesions. Electronically Signed   By: Nolon Nations M.D.   On: 12/20/2015 15:05    I have independently reviewed the above radiology studies  and reviewed the findings with the patient.     Stress test:done [facility]  CLINICAL DATA:  Chest pain  EXAM: MYOCARDIAL IMAGING WITH SPECT (REST AND PHARMACOLOGIC-STRESS)  GATED LEFT VENTRICULAR WALL MOTION STUDY  LEFT VENTRICULAR EJECTION FRACTION  TECHNIQUE: Standard myocardial SPECT imaging was performed after resting intravenous injection of 11.0 mCi Tc-58m sestamibi. Subsequently, intravenous infusion of Lexiscan was performed under the supervision of the Cardiology staff. At peak effect of the drug, 32.6 mCi Tc-78m sestamibi was injected intravenously and standard myocardial SPECT imaging was performed. Quantitative gated imaging was also performed to evaluate left ventricular wall motion, and estimate left ventricular ejection fraction.  COMPARISON:  None.  FINDINGS: Perfusion: Regional images of the left  ventricular myocardium show no fixed defect to suggest infarct or reversible lesion to suggest ischemic change. No abnormality is identified on the SPECT study.  Wall Motion: Left ventricle is rather prominent. There is mild apical hypokinesis. No other wall motion abnormality is identified in the left ventricle.  Left Ventricular Ejection Fraction: 47 %  End diastolic volume [id number] ml  End systolic volume 69 ml  IMPRESSION: 1. No reversible ischemia or infarction.  2. There is left ventricular prominence. There is mild apical hypokinesis. No other wall motion abnormalities are identified.  3. Left ventricular ejection fraction 47%  4. Intermediate risk stress test findings*.  *  2012 Appropriate Use Criteria for Coronary Revascularization Focused Update: J Am Coll Cardiol. [id number]. http://content.airportbarriers.com.aspx?articleid=1201161   Electronically Signed   By: Lowella Grip III M.D.   On: 06/06/2015 13:05  No significant chest pain symptoms reported  No significant arrhythmia noted  This is not a final report, NM images to follow  No significant ST segment changes or arrhythmias were noted during  stress  Uneventful Lexiscan injection.  Nuclear perfusion report to follow under separate cover  CT: CLINICAL DATA:  Epigastric pain. Shortness of breath, nausea. Back pain. History of hypertension, Raynaud's disease, hernia repair.  EXAM: CT ANGIOGRAPHY CHEST, ABDOMEN AND PELVIS  TECHNIQUE: Multidetector CT imaging through the chest, abdomen and pelvis was performed using the standard protocol during bolus administration of intravenous contrast. Multiplanar reconstructed images and MIPs were obtained and reviewed to evaluate the vascular anatomy.  CONTRAST:  100 cc Omnipaque 350  COMPARISON:  None.  FINDINGS: CTA CHEST FINDINGS  Mediastinum: Fusiform ascending aorta is enlarged, 4.8 cm with moderate calcific atherosclerosis of the  aortic arch and descending aorta. Subcentimeter focal intimal hematoma descending aorta. No abnormal density within upper along the course of the aorta. Small amount of free fluid in the is sulcus, normal. No dissection, suspicious luminal irregularity/ulceration, contrast extravasation or periaortic hematoma. Heart size is mildly enlarged. Mild coronary artery calcifications. No pericardial fluid collection. No mediastinal lymphadenopathy.  Lungs: No pleural effusion or focal consolidation. Minimal pleural thickening/scarring of the RIGHT major fissure. Tracheobronchial tree is patent and midline, no pneumothorax.  Soft tissues and osseous structures: Mild degenerative change of the thoracic spine. No destructive bony lesions. Mild gynecomastia.  Review of the MIP images confirms the above findings.  CTA ABDOMEN AND PELVIS FINDINGS  Retroperitoneum: Aortoiliac vessels are normal in course and caliber with moderate to severe calcific atherosclerosis. Celiac trunk origin is widely patent. Mild calcific atherosclerosis of the origin of the renal arteries, superior mesenteric artery. Inferior mesenteric artery is patent.  Organs: 2.5 cm flash filling hemangioma RIGHT lobe of the liver. 2.3 cm low-density cyst segment 4. No intrahepatic biliary dilatation. Spleen, gallbladder are normal. Fatty pancreas, otherwise unremarkable. Thickened adrenal glands can be seen with hyperplasia without discrete nodule.  GI tract: Small hiatal hernia. Stomach, small large bowel are normal in course and caliber without inflammatory changes though sensitivity may be decreased by lack of enteric contrast.  GU tract: Kidneys are orthotopic, normal size morphology. 4.7 cm exophytic LEFT lower pole cyst. 3 mm LEFT upper pole nonobstructing nephrolithiasis. 2.2 cm exophytic RIGHT interpolar cyst. Prostate is partially distended. Moderate prostatomegaly invading the base of the bladder.  Peritoneum: No  intraperitoneal free fluid or free air. No lymphadenopathy by CT size criteria.  Soft tissues and osseous structures: Moderate to large LEFT, small RIGHT fat containing inguinal hernias. Small fat containing umbilical hernia. Grade 1 L5-S1 anterolisthesis, chronic bilateral L5 pars interarticularis defects, moderate to severe L5-S1 neural foraminal narrowing.  Review of the MIP images confirms the above findings.  IMPRESSION: CTA CHEST: Fusiform 4.8 cm ascending aortic aneurysm without dissection or acute vascular process. Recommend semi-annual imaging followup by CTA or MRA and referral to cardiothoracic surgery if not already obtained. This recommendation follows 2010 ACCF/AHA/AATS/ACR/ASA/SCA/SCAI/SIR/STS/SVM Guidelines for the Diagnosis and Management of Patients With Thoracic Aortic Disease. Circulation. 2010; [id number].  No acute cardiopulmonary process.  CTA ABDOMEN AND PELVIS: Atherosclerosis of the aortoiliac vessels without high-grade stenosis or acute vascular process.  No acute intra-abdominal or pelvic process. 3 mm nonobstructing LEFT upper pole nephrolithiasis.  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  Prostatomegaly.   Electronically Signed   By: Elon Alas M.D.   On: 06/05/2015 00:21  ECHO: Transthoracic Echocardiography Report (TTE)  Demographics  Patient Name      Joseph [name]     Gender                Male  Patient Number    [id number]   Race                  Caucasian  Visit Number      [id number]    Room Number           [id number]  Accession Number  [id number]  Date of Study         06/05/2015  Date of Birth     04-04-1941     Referring Physician   ED Fay Records, MD  Age               22 year(s)     Sonographer           Marikate Tennis,                                                         RDCS, BS                                   Interpreting          Rozann Lesches, MD                                   Physician Procedure Type of Study  TTE  procedure: ECHOCARDIOGRAM FOLLOW UP/ LIMITED W DOPPLER. Procedure date Date: 06/05/2015 Start: 09:20 AM Technical Quality: Adequate visualization Study Location: Portable Indications: Chest pain. Patient Status: Routine Height: 68.11 inches Weight: 255.74 pounds BSA: 2.27 m2 BMI: 38.76 kg/m2 Rhythm: Normal sinus rhythm HR: 82 bpm BP: 116/69 mmHg Conclusions Summary Mild concentric left ventricular hypertrophy Normal left ventricular systolic function with no appreciable segmental abnormality. Mild AR. Signature ------------------------------------------------------------------------------  Electronically signed by Rozann Lesches, MD(Interpreting physician) on 06/05/2015  01:27 PM ------------------------------------------------------------------------------ Findings Mitral Valve Structurally normal mitral valve with good mobility and no significant regurgitation by color flow doppler examination. Aortic Valve Structurally normal aortic valve with good leaflet mobility, and mild regurgitation by color flow doppler examination. Tricuspid Valve Tricuspid valve is structurally normal. Mild tricuspid regurgitation by color flow doppler examination. Pulmonic Valve Pulmonic valve is structurally normal. No Doppler evidence of pulmonic stenosis or insufficiency. Left Atrium Normal size left atrium. Left Ventricle Mild concentric left ventricular hypertrophy Normal left ventricular systolic function with no appreciable segmental abnormality. Right Atrium Mild right atrial enlargement Right Ventricle Mildly dilated right ventricle. Pericardial Effusion No evidence of pericardial effusion. Miscellaneous Aortic root dimension within normal limits. IVC dilated. M-Mode/2D Measurements & Calculations  LV Diastolic Dimension:  LV Systolic Dimension:     LA Dimension: 3.42 cmAO  5.56 cm                  2.92 cm                    Root Dimension:  3.89 cm  LV FS:47.48 %            LV Volume  Diastolic: [id number]  LV PW Diastolic: 1.4 cm  ml  Septum Diastolic: 0000000   LV Volume Systolic: [id number]  cm                       ml                           LV EDV/LV EDV Index: 127   LA/Aorta: 0.88                           ml/56 m2LV ESV/LV ESV                           Index: 50.5 ml/22 m2                           EF Calculated: 60.24 % Doppler Measurements & Calculations  MV Peak E-Wave: 72.7 cm/s AV Peak Velocity: 111  MV Peak A-Wave: 86.4 cm/s cm/s                     Estimated RVSP: 32.8 mmHg  MV E/A Ratio: 0.84        AV Peak Gradient: 4.93   Estimated RAP:10 mmHg  MV Peak Gradient: 2.11    mmHg  mmHg                                                     PV Peak Velocity: 102  TDI E' Velocity: 15 cm/s                           cm/s                                                     PV Peak Gradient: 4.16                                                     mmHg  E/E' prime 4.9 [address] [place], [place] [address] [phone]   Aortic Size Index=  4.8       /Body surface area is 2.32 meters squared. = 2.05    < 2.75 cm/m2      4% risk per year 2.75 to 4.25          8% risk per year > 4.25 cm/m2    20% risk per year  A  Assessment / Plan:  Ascending aortic aneurysm, 4.8 cm. Appearance is stable compared with previous CT of the chest from July 2016. Aortic Valve Structurally normal aortic valve with good leaflet mobility by echo  I've offered the patient repeat CT scan in 6-8 months however he prefers to wait 12 months, he will have a CTA  of the chest in 12 months and I will see him at that time and follow-up.  We discussed the need to lose weight and obtain good blood pressure control.    Grace Isaac MD      [facility].[address] [place] [address] Office [phone]   Beeper [phone]  08/28/2016 3:05 PM

## 2016-08-29 LAB — CMP14+EGFR
ALT: 20 IU/L (ref 0–44)
AST: 18 IU/L (ref 0–40)
Albumin/Globulin Ratio: 1.5 (ref 1.2–2.2)
Albumin: 4.1 g/dL (ref 3.5–4.8)
Alkaline Phosphatase: 63 IU/L (ref 39–117)
BUN/Creatinine Ratio: 15 (ref 10–24)
BUN: 15 mg/dL (ref 8–27)
Bilirubin Total: 0.5 mg/dL (ref 0.0–1.2)
CALCIUM: 9.7 mg/dL (ref 8.6–10.2)
CO2: 25 mmol/L (ref 18–29)
Chloride: 103 mmol/L (ref 96–106)
Creatinine, Ser: 1.02 mg/dL (ref 0.76–1.27)
GFR, EST AFRICAN AMERICAN: 84 mL/min/{1.73_m2} (ref >59)
GFR, EST NON AFRICAN AMERICAN: 73 mL/min/{1.73_m2} (ref >59)
GLUCOSE: 121 mg/dL — AB (ref 65–99)
Globulin, Total: 2.8 g/dL (ref 1.5–4.5)
Potassium: 3.6 mmol/L (ref 3.5–5.2)
Sodium: 143 mmol/L (ref 134–144)
TOTAL PROTEIN: 6.9 g/dL (ref 6.0–8.5)

## 2016-08-29 LAB — LIPID PANEL
CHOLESTEROL TOTAL: 129 mg/dL (ref 100–199)
Chol/HDL Ratio: 3.2 ratio units (ref 0.0–5.0)
HDL: 40 mg/dL (ref >39)
LDL CALC: 65 mg/dL (ref 0–99)
TRIGLYCERIDES: 121 mg/dL (ref 0–149)
VLDL CHOLESTEROL CAL: 24 mg/dL (ref 5–40)

## 2016-09-18 ENCOUNTER — Other Ambulatory Visit: Payer: Self-pay | Admitting: Nurse Practitioner

## 2016-09-18 DIAGNOSIS — F411 Generalized anxiety disorder: Secondary | ICD-10-CM

## 2016-09-18 NOTE — Telephone Encounter (Signed)
Last filled 08/20/16, last seen 08/28/16. Route to pool

## 2016-09-19 ENCOUNTER — Other Ambulatory Visit: Payer: Self-pay | Admitting: Nurse Practitioner

## 2016-09-19 DIAGNOSIS — F411 Generalized anxiety disorder: Secondary | ICD-10-CM

## 2016-09-19 NOTE — Telephone Encounter (Signed)
Please change to  Please call in clonazepam  with 2 refills

## 2016-09-19 NOTE — Telephone Encounter (Signed)
Refill called to [facility] 

## 2016-09-19 NOTE — Telephone Encounter (Signed)
Please call in clonazepam with 1 refills 

## 2016-09-22 NOTE — Telephone Encounter (Signed)
Please call in clonazepam with 1 refills 

## 2016-09-22 NOTE — Telephone Encounter (Signed)
Refill called to [facility] w/ 1 refill per written note

## 2016-10-16 ENCOUNTER — Other Ambulatory Visit: Payer: Self-pay | Admitting: Nurse Practitioner

## 2016-10-16 DIAGNOSIS — I7300 Raynaud's syndrome without gangrene: Secondary | ICD-10-CM

## 2016-10-18 ENCOUNTER — Other Ambulatory Visit: Payer: Self-pay | Admitting: Nurse Practitioner

## 2016-10-18 DIAGNOSIS — I7300 Raynaud's syndrome without gangrene: Secondary | ICD-10-CM

## 2016-10-20 ENCOUNTER — Encounter: Payer: Self-pay | Admitting: Nurse Practitioner

## 2016-10-20 ENCOUNTER — Ambulatory Visit (INDEPENDENT_AMBULATORY_CARE_PROVIDER_SITE_OTHER): Payer: Commercial Managed Care - HMO | Admitting: Nurse Practitioner

## 2016-10-20 VITALS — BP 124/76 | HR 81 | Temp 98.3°F | Ht 68.0 in | Wt 257.0 lb

## 2016-10-20 DIAGNOSIS — I7300 Raynaud's syndrome without gangrene: Secondary | ICD-10-CM | POA: Diagnosis not present

## 2016-10-20 DIAGNOSIS — Z0289 Encounter for other administrative examinations: Secondary | ICD-10-CM

## 2016-10-20 DIAGNOSIS — F1120 Opioid dependence, uncomplicated: Secondary | ICD-10-CM

## 2016-10-20 DIAGNOSIS — M1A09X0 Idiopathic chronic gout, multiple sites, without tophus (tophi): Secondary | ICD-10-CM

## 2016-10-20 DIAGNOSIS — M1990 Unspecified osteoarthritis, unspecified site: Secondary | ICD-10-CM | POA: Diagnosis not present

## 2016-10-20 MED ORDER — TRAMADOL HCL 50 MG PO TABS: 50.0000 mg | ORAL_TABLET | Freq: Two times a day (BID) | ORAL | 0 refills | Status: DC

## 2016-10-20 MED ORDER — TRAMADOL HCL 50 MG PO TABS: 50.0000 mg | ORAL_TABLET | Freq: Three times a day (TID) | ORAL | 2 refills | Status: DC | PRN

## 2016-10-20 NOTE — Progress Notes (Signed)
Subjective:    Patient ID: Joseph [name], male    DOB: Feb 10, 1941, 75 y.o.   MRN: [id number]  HPI  Patient comes in for refill of his tramadol- he has been on it for quite some time. He is suppose to use when he has gout pain. He has gout pain nightly. [place] Controlled Substance Abuse database reviewed- Yes If yes- were their any concerning findings : no concerning findings Depression screen [facility] 2/9 10/20/2016 08/28/2016 05/26/2016 02/11/2016 10/30/2015  Decreased Interest 0 0 0 0 0  Down, Depressed, Hopeless 0 0 0 0 0  PHQ - 2 Score 0 0 0 0 0  Altered sleeping - - - - -  Tired, decreased energy - - - - -  Change in appetite - - - - -  Feeling bad or failure about yourself  - - - - -  Trouble concentrating - - - - -  Moving slowly or fidgety/restless - - - - -  Suicidal thoughts - - - - -  PHQ-9 Score - - - - -    GAD 7 : Generalized Anxiety Score 10/20/2016 10/30/2015  Nervous, Anxious, on Edge 0 1  Control/stop worrying 0 0  Worry too much - different things 0 1  Trouble relaxing 0 1  Restless 0 1  Easily annoyed or irritable 0 1  Afraid - awful might happen 0 0  Total GAD 7 Score 0 5  Anxiety Difficulty Not difficult at all Somewhat difficult       [place] drug screen performed- No  SOAPP  0= never  1= seldom  2=sometimes  3= often  4= very often  How often do you have mood swings? 0 How often do you smoke a cigarette within an hour after waling up? 0 How often have you taken medication other than the way that it was prescribed?0 How often have you used illegal drugs in the past 5 years? 0 How often, in your lifetime, have you had legal problems or been arrested? 0  Score 0  Alcohol Audit - How often during the last year have found that you: 0-Never   1- Less than monthly   2- Monthly     3-Weekly     4-daily or almost daily  - found that you were not able to stop drinking once you started- 0 -failed to do what was normally expected of you because of  drinking- 0 -needed a first drink in the morning- 0 -had a feeling of guilt or remorse after drinking- 0 -are/were unable to remember what happened the night before because of your drinking- 0  0- NO   2- yes but not in last year  4- yes during last year -Have you or someone else been injured because of your drinking- 0 - Has anyone been concerned about your drinking or suggested you cut down- 0        TOTAL- 0  ( 0-7- alcohol education, 8-15- simple advice, 16-19 simple advice plus counseling, 20-40 referral for evaluation and treatment 0   [facility]  Pain assessment: Cause of pain- gout and arthritis and raynauds Pain location- gout is in left foot and arthritis is in bil lower ext- raynauds bil fingers Pain on scale of 1-10- 7-8/10 when flares up Frequency- daily as needed What increases pain-walking and doing a lot of household work What makes pain Better-none realyy- goes on despite pain Effects on ADL - not eally  Prior treatments tried  and failed- he is on allopurinol- but still has occassional gout flare ups Current treatments- ultram 50 mg no more then BID Morphine mg equivalent- 10  Pain management agreement reviewed and signed- Yes    Review of Systems  Constitutional: Negative.   HENT: Negative.   Respiratory: Negative.   Cardiovascular: Negative.   Gastrointestinal: Negative.   Musculoskeletal: Positive for arthralgias (bil legs).  Neurological: Negative.   Psychiatric/Behavioral: Negative.   All other systems reviewed and are negative.      Objective:   Physical Exam  Constitutional: He is oriented to person, place, and time. He appears well-developed and well-nourished. No distress.  Cardiovascular: Normal rate, regular rhythm and normal heart sounds.   Pulmonary/Chest: Effort normal and breath sounds normal.  Musculoskeletal:  Currently having no pain right now  Neurological: He is alert and oriented to person, place, and  time.  Skin: Skin is warm.  Psychiatric: He has a normal mood and affect. His behavior is normal. Judgment and thought content normal.   BP 124/76   Pulse 81   Temp 98.3 F (36.8 C) (Oral)   Ht 5\' 8"  (1.727 m)   Wt 257 lb (116.6 kg)   BMI 39.08 kg/m         Assessment & Plan:   1. Idiopathic chronic gout of multiple sites without tophus   2. Raynaud's disease without gangrene   3. Arthritis    Meds ordered this encounter  Medications  . traMADol (ULTRAM) 50 MG tablet    Sig: Take 1 tablet (50 mg total) by mouth every 8 (eight) hours as needed.    Dispense:  60 tablet    Refill:  2    Order Specific Question:   Supervising Provider    Answer:   Joseph [name], Joseph [name] [[id number]]  . traMADol (ULTRAM) 50 MG tablet    Sig: Take 1 tablet (50 mg total) by mouth 2 (two) times daily.    Dispense:  60 tablet    Refill:  0    DO NOT FILL TILL 11/18/16    Order Specific Question:   Supervising Provider    Answer:   Joseph [name] [[id number]]  . traMADol (ULTRAM) 50 MG tablet    Sig: Take 1 tablet (50 mg total) by mouth 2 (two) times daily.    Dispense:  60 tablet    Refill:  0    DO NOT FILL TILL1/18/18    Order Specific Question:   Supervising Provider    Answer:   Joseph [name] [[id number]]   Rest Moist heat to legs Keep appointment at end of December for chronic follow up.  Joseph Hassell Done, FNP

## 2016-10-22 ENCOUNTER — Other Ambulatory Visit: Payer: Self-pay | Admitting: Nurse Practitioner

## 2016-11-27 ENCOUNTER — Other Ambulatory Visit: Payer: Self-pay | Admitting: Nurse Practitioner

## 2016-11-27 DIAGNOSIS — G4700 Insomnia, unspecified: Secondary | ICD-10-CM

## 2016-11-27 NOTE — Telephone Encounter (Signed)
Please call in ambien with 1 refills 

## 2016-11-28 ENCOUNTER — Ambulatory Visit (INDEPENDENT_AMBULATORY_CARE_PROVIDER_SITE_OTHER): Payer: Commercial Managed Care - HMO | Admitting: Nurse Practitioner

## 2016-11-28 ENCOUNTER — Encounter: Payer: Self-pay | Admitting: Nurse Practitioner

## 2016-11-28 VITALS — BP 118/73 | HR 72 | Temp 96.8°F | Ht 68.0 in | Wt 253.0 lb

## 2016-11-28 DIAGNOSIS — Z1211 Encounter for screening for malignant neoplasm of colon: Secondary | ICD-10-CM

## 2016-11-28 DIAGNOSIS — E876 Hypokalemia: Secondary | ICD-10-CM

## 2016-11-28 DIAGNOSIS — F411 Generalized anxiety disorder: Secondary | ICD-10-CM

## 2016-11-28 DIAGNOSIS — N400 Enlarged prostate without lower urinary tract symptoms: Secondary | ICD-10-CM | POA: Diagnosis not present

## 2016-11-28 DIAGNOSIS — I10 Essential (primary) hypertension: Secondary | ICD-10-CM | POA: Diagnosis not present

## 2016-11-28 DIAGNOSIS — I7300 Raynaud's syndrome without gangrene: Secondary | ICD-10-CM | POA: Diagnosis not present

## 2016-11-28 DIAGNOSIS — F5101 Primary insomnia: Secondary | ICD-10-CM

## 2016-11-28 DIAGNOSIS — I7121 Aneurysm of the ascending aorta, without rupture: Secondary | ICD-10-CM

## 2016-11-28 DIAGNOSIS — Z1212 Encounter for screening for malignant neoplasm of rectum: Secondary | ICD-10-CM

## 2016-11-28 DIAGNOSIS — E785 Hyperlipidemia, unspecified: Secondary | ICD-10-CM | POA: Diagnosis not present

## 2016-11-28 DIAGNOSIS — I712 Thoracic aortic aneurysm, without rupture: Secondary | ICD-10-CM

## 2016-11-28 MED ORDER — ZOLPIDEM TARTRATE 10 MG PO TABS: 10.0000 mg | ORAL_TABLET | Freq: Every evening | ORAL | 0 refills | Status: DC | PRN

## 2016-11-28 MED ORDER — HYDROCHLOROTHIAZIDE 25 MG PO TABS: 25.0000 mg | ORAL_TABLET | Freq: Every day | ORAL | 3 refills | Status: DC

## 2016-11-28 MED ORDER — CLONAZEPAM 0.5 MG PO TABS: 0.5000 mg | ORAL_TABLET | Freq: Two times a day (BID) | ORAL | 2 refills | Status: DC | PRN

## 2016-11-28 MED ORDER — BENAZEPRIL HCL 20 MG PO TABS: 20.0000 mg | ORAL_TABLET | Freq: Every day | ORAL | 1 refills | Status: DC

## 2016-11-28 MED ORDER — METOPROLOL TARTRATE 25 MG PO TABS: 12.5000 mg | ORAL_TABLET | Freq: Two times a day (BID) | ORAL | 3 refills | Status: DC

## 2016-11-28 MED ORDER — POTASSIUM 99 MG PO TABS: 1.0000 | ORAL_TABLET | Freq: Every day | ORAL | 1 refills | Status: DC

## 2016-11-28 MED ORDER — ATORVASTATIN CALCIUM 40 MG PO TABS: 40.0000 mg | ORAL_TABLET | Freq: Every day | ORAL | 3 refills | Status: DC

## 2016-11-28 MED ORDER — NIFEDIPINE ER OSMOTIC RELEASE 30 MG PO TB24: 30.0000 mg | ORAL_TABLET | Freq: Every day | ORAL | 1 refills | Status: DC

## 2016-11-28 NOTE — Addendum Note (Signed)
Addended by: Rolena Infante on: 11/28/2016 04:12 PM   Modules accepted: Orders

## 2016-11-28 NOTE — Progress Notes (Signed)
Subjective:    Patient ID: Joseph [name], male    DOB: [birth year], 75 y.o.   MRN: [id number]  Patient here today for follow up of chronic medical problems. No changes since last visit. No complaints today   Outpatient Encounter Prescriptions as of 11/28/2016  Medication Sig  . allopurinol (ZYLOPRIM) 300 MG tablet TAKE ONE TABLET BY MOUTH ONCE DAILY  . aspirin 81 MG tablet Take 81 mg by mouth daily.  Marland Kitchen atorvastatin (LIPITOR) 40 MG tablet Take 1 tablet (40 mg total) by mouth daily.  . benazepril (LOTENSIN) 20 MG tablet Take 1 tablet (20 mg total) by mouth daily.  . clonazePAM (KLONOPIN) 0.5 MG tablet TAKE ONE TABLET BY MOUTH TWICE DAILY AS NEEDED  . docusate sodium (COLACE) 100 MG capsule Take 100 mg by mouth daily as needed for mild constipation.  . fish oil-omega-3 fatty acids 1000 MG capsule Take 2 g by mouth daily.  . hydrochlorothiazide (HYDRODIURIL) 25 MG tablet Take 1 tablet (25 mg total) by mouth daily.  . metoprolol tartrate (LOPRESSOR) 25 MG tablet Take 0.5 tablets (12.5 mg total) by mouth 2 (two) times daily.  . Multiple Vitamins-Minerals (COMPLETE MULTIVITAMIN/MINERAL PO) Take 1 tablet by mouth daily.  Marland Kitchen NIFEdipine (PROCARDIA-XL/ADALAT-CC/NIFEDICAL-XL) 30 MG 24 hr tablet Take 1 tablet (30 mg total) by mouth daily.  . Potassium 99 MG TABS Take 1 tablet (99 mg total) by mouth daily.  . traMADol (ULTRAM) 50 MG tablet Take 1 tablet (50 mg total) by mouth every 8 (eight) hours as needed.  . traMADol (ULTRAM) 50 MG tablet Take 1 tablet (50 mg total) by mouth 2 (two) times daily.  . traMADol (ULTRAM) 50 MG tablet Take 1 tablet (50 mg total) by mouth 2 (two) times daily.  Marland Kitchen zolpidem (AMBIEN) 10 MG tablet TAKE ONE TABLET BY MOUTH AT BEDTIME AS NEEDED FOR SLEEP   No facility-administered encounter medications on file as of 11/28/2016.      Hyperlipidemia  This is a chronic problem. The current episode started more than 1 year ago. The problem is controlled. Recent lipid tests were  reviewed and are normal. He has no history of diabetes, hypothyroidism or obesity. There are no known factors aggravating his hyperlipidemia. Pertinent negatives include no chest pain or shortness of breath. Current antihyperlipidemic treatment includes statins. The current treatment provides moderate improvement of lipids. Compliance problems include adherence to diet and adherence to exercise.  Risk factors for coronary artery disease include dyslipidemia, hypertension and obesity.  Hypertension  This is a chronic problem. The current episode started more than 1 year ago. The problem is unchanged. The problem is controlled. Pertinent negatives include no chest pain, headaches, palpitations or shortness of breath. Risk factors for coronary artery disease include dyslipidemia and male gender. Past treatments include diuretics and ACE inhibitors. The current treatment provides significant improvement. Compliance problems include diet and exercise.   Insomnia Ambien- works well - can't sleep without it BPH Pt voiding without difficulty. He stopped seeing Carmelina Noun.  GOUT  Usually in his feet- he has not have an attack for some time. Taking allopurinol 300mg .  GAD On klonopin and has been taking 2x a day for several weeks now- says that he has been very anxious lately Thoracic aortic aneurysm Being watch by cardiology- no problems that he is aware of. He is having x ray done this afternoon for evaluation. raynauds syndrome Procardia- helps some- still has to sleep in cotton gloves hypokalemia Currently taking potassium supplements  Review of Systems  Constitutional: Negative.   HENT: Negative.   Eyes: Negative.   Respiratory: Negative.  Negative for shortness of breath.   Cardiovascular: Negative.  Negative for chest pain, palpitations and leg swelling.  Gastrointestinal: Negative.   Endocrine: Negative.   Genitourinary: Negative.   Musculoskeletal: Negative.   Skin: Negative.    Allergic/Immunologic: Negative.   Neurological: Negative.  Negative for headaches.  Hematological: Negative.   Psychiatric/Behavioral: Negative.   All other systems reviewed and are negative.      Objective:   Physical Exam  Constitutional: He is oriented to person, place, and time. Vital signs are normal. He appears well-developed and well-nourished.  HENT:  Head: Normocephalic.  Right Ear: Hearing, tympanic membrane and external ear normal.  Left Ear: External ear normal.  Nose: Nose normal.  Mouth/Throat: Uvula is midline and oropharynx is clear and moist.  Eyes: Conjunctivae and EOM are normal. Pupils are equal, round, and reactive to light.  Neck: Trachea normal and normal range of motion. Neck supple. No JVD present. No thyromegaly present.  Cardiovascular: Normal rate, regular rhythm, normal heart sounds and intact distal pulses.  Exam reveals no gallop and no friction rub.   No murmur heard. (+) allen test bil hands  Pulmonary/Chest: Effort normal and breath sounds normal. No respiratory distress. He has no wheezes. He has no rales. He exhibits no tenderness.  Abdominal: Soft. Bowel sounds are normal. He exhibits no mass. There is no tenderness.  Musculoskeletal: Normal range of motion. He exhibits no edema.  Lymphadenopathy:    He has no cervical adenopathy.  Neurological: He is alert and oriented to person, place, and time. No cranial nerve deficit.  Skin: Skin is warm, dry and intact.  Psychiatric: He has a normal mood and affect. His speech is normal and behavior is normal. Judgment and thought content normal. Cognition and memory are normal.    BP 118/73   Pulse 72   Temp (!) 96.8 F (36 C) (Oral)   Ht 5\' 8"  (1.727 m)   Wt 253 lb (114.8 kg)   BMI 38.47 kg/m   Right ear irrigation fo rcerumen     Assessment & Plan:  1. Essential hypertension, benign Low sodium diet - hydrochlorothiazide (HYDRODIURIL) 25 MG tablet; Take 1 tablet (25 mg total) by mouth  daily.  Dispense: 90 tablet; Refill: 3 - benazepril (LOTENSIN) 20 MG tablet; Take 1 tablet (20 mg total) by mouth daily.  Dispense: 90 tablet; Refill: 1 - metoprolol tartrate (LOPRESSOR) 25 MG tablet; Take 0.5 tablets (12.5 mg total) by mouth 2 (two) times daily.  Dispense: 90 tablet; Refill: 3  2. Raynaud's disease without gangrene Wear gloves and warm socks when outside in cold  3. Thoracic ascending aortic aneurysm (HCC) - NIFEdipine (PROCARDIA-XL/ADALAT-CC/NIFEDICAL-XL) 30 MG 24 hr tablet; Take 1 tablet (30 mg total) by mouth daily.  Dispense: 90 tablet; Refill: 1  4. Benign prostatic hyperplasia without lower urinary tract symptoms  5. Hyperlipidemia with target LDL less than 100 Low fat diet - atorvastatin (LIPITOR) 40 MG tablet; Take 1 tablet (40 mg total) by mouth daily.  Dispense: 90 tablet; Refill: 3  6. Hypokalemia - Potassium 99 MG TABS; Take 1 tablet (99 mg total) by mouth daily.  Dispense: 330 each; Refill: 1  7. Primary insomnia Bedtime routine - zolpidem (AMBIEN) 10 MG tablet; Take 1 tablet (10 mg total) by mouth at bedtime as needed. for sleep  Dispense: 90 tablet; Refill: 0  8. Severe obesity (BMI >= 40) (HCC) Discussed diet and  exercise for person with BMI >25 Will recheck weight in 3-6 months  9. Encounter for colorectal cancer screening - Fecal occult blood, imunochemical; Future  10. GAD (generalized anxiety disorder) Stress management - clonazePAM (KLONOPIN) 0.5 MG tablet; Take 1 tablet (0.5 mg total) by mouth 2 (two) times daily as needed.  Dispense: 60 tablet; Refill: 2     Labs pending Health maintenance reviewed Diet and exercise encouraged Continue all meds Follow up  In 3 months  [place], FNP

## 2016-11-28 NOTE — Patient Instructions (Signed)
Earwax Buildup Your ears make a substance called earwax. It may also be called cerumen. Sometimes, too much earwax builds up in your ear canal. This can cause ear pain and make it harder for you to hear. CAUSES This condition is caused by too much earwax production or buildup. RISK FACTORS The following factors may make you more likely to develop this condition:  Cleaning your ears often with swabs.  Having narrow ear canals.  Having earwax that is overly thick or sticky.  Having eczema.  Being dehydrated. SYMPTOMS Symptoms of this condition include:  Reduced hearing.  Ear drainage.  Ear pain.  Ear itch.  A feeling of fullness in the ear or feeling that the ear is plugged.  Ringing in the ear.  Coughing. DIAGNOSIS Your health care provider can diagnose this condition based on your symptoms and medical history. Your health care provider will also do an ear exam to look inside your ear with a scope (otoscope). You may also have a hearing test. TREATMENT Treatment for this condition includes:  Over-the-counter or prescription ear drops to soften the earwax.  Earwax removal by a health care provider. This may be done:  By flushing the ear with body-temperature water.  With a medical instrument that has a loop at the end (earwax curette).  With a suction device. HOME CARE INSTRUCTIONS  Take over-the-counter and prescription medicines only as told by your health care provider.  Do not put any objects, including an ear swab, into your ear. You can clean the opening of your ear canal with a washcloth.  Drink enough water to keep your urine clear or pale yellow.  If you have frequent earwax buildup or you use hearing aids, consider seeing your health care provider every 6-12 months for routine preventive ear cleanings. Keep all follow-up visits as told by your health care provider. SEEK MEDICAL CARE IF:  You have ear pain.  Your condition does not improve with  treatment.  You have hearing loss.  You have blood, pus, or other fluid coming from your ear. This information is not intended to replace advice given to you by your health care provider. Make sure you discuss any questions you have with your health care provider. Document Released: 12/25/2004 Document Revised: 03/10/2016 Document Reviewed: 07/04/2015 Elsevier Interactive Patient Education  2017 [employer]  

## 2016-11-28 NOTE — Telephone Encounter (Signed)
Rx called in 

## 2016-11-29 LAB — CMP14+EGFR
A/G RATIO: 1.5 (ref 1.2–2.2)
ALT: 17 IU/L (ref 0–44)
AST: 22 IU/L (ref 0–40)
Albumin: 4 g/dL (ref 3.5–4.8)
Alkaline Phosphatase: 65 IU/L (ref 39–117)
BUN / CREAT RATIO: 15 (ref 10–24)
BUN: 13 mg/dL (ref 8–27)
Bilirubin Total: 0.4 mg/dL (ref 0.0–1.2)
CALCIUM: 9.6 mg/dL (ref 8.6–10.2)
CO2: 26 mmol/L (ref 18–29)
Chloride: 96 mmol/L (ref 96–106)
Creatinine, Ser: 0.88 mg/dL (ref 0.76–1.27)
GFR, EST AFRICAN AMERICAN: 99 mL/min/{1.73_m2} (ref >59)
GFR, EST NON AFRICAN AMERICAN: 85 mL/min/{1.73_m2} (ref >59)
GLOBULIN, TOTAL: 2.6 g/dL (ref 1.5–4.5)
Glucose: 115 mg/dL — ABNORMAL HIGH (ref 65–99)
POTASSIUM: 3.8 mmol/L (ref 3.5–5.2)
SODIUM: 139 mmol/L (ref 134–144)
TOTAL PROTEIN: 6.6 g/dL (ref 6.0–8.5)

## 2016-11-29 LAB — LIPID PANEL
Chol/HDL Ratio: 2.7 ratio units (ref 0.0–5.0)
Cholesterol, Total: 120 mg/dL (ref 100–199)
HDL: 44 mg/dL (ref >39)
LDL Calculated: 54 mg/dL (ref 0–99)
Triglycerides: 111 mg/dL (ref 0–149)
VLDL Cholesterol Cal: 22 mg/dL (ref 5–40)

## 2016-12-02 ENCOUNTER — Encounter: Payer: Self-pay | Admitting: *Deleted

## 2016-12-04 ENCOUNTER — Other Ambulatory Visit: Payer: Medicare HMO

## 2016-12-04 DIAGNOSIS — Z1212 Encounter for screening for malignant neoplasm of rectum: Principal | ICD-10-CM

## 2016-12-04 DIAGNOSIS — Z1211 Encounter for screening for malignant neoplasm of colon: Secondary | ICD-10-CM

## 2016-12-05 LAB — FECAL OCCULT BLOOD, IMMUNOCHEMICAL: FECAL OCCULT BLD: NEGATIVE

## 2017-01-12 ENCOUNTER — Other Ambulatory Visit: Payer: Self-pay | Admitting: Nurse Practitioner

## 2017-01-12 DIAGNOSIS — I7300 Raynaud's syndrome without gangrene: Secondary | ICD-10-CM

## 2017-01-14 ENCOUNTER — Other Ambulatory Visit: Payer: Self-pay | Admitting: Nurse Practitioner

## 2017-01-14 ENCOUNTER — Telehealth: Payer: Self-pay | Admitting: Nurse Practitioner

## 2017-01-14 DIAGNOSIS — I7300 Raynaud's syndrome without gangrene: Secondary | ICD-10-CM

## 2017-01-14 MED ORDER — ALLOPURINOL 300 MG PO TABS: 300.0000 mg | ORAL_TABLET | Freq: Every day | ORAL | 0 refills | Status: DC

## 2017-01-14 MED ORDER — TRAMADOL HCL 50 MG PO TABS: 50.0000 mg | ORAL_TABLET | Freq: Two times a day (BID) | ORAL | 0 refills | Status: DC | PRN

## 2017-01-14 NOTE — Telephone Encounter (Signed)
Covering PCP

## 2017-01-14 NOTE — Telephone Encounter (Signed)
Attempted to contact patient no answer and no vm    

## 2017-01-20 NOTE — Telephone Encounter (Signed)
lmtcb jkp 2/20

## 2017-02-09 ENCOUNTER — Other Ambulatory Visit: Payer: Self-pay | Admitting: Pediatrics

## 2017-02-10 NOTE — Telephone Encounter (Signed)
[facility] home VM NTBS

## 2017-02-10 NOTE — Telephone Encounter (Signed)
Patient has to e seen for ultram rx

## 2017-02-13 ENCOUNTER — Other Ambulatory Visit: Payer: Self-pay | Admitting: Pediatrics

## 2017-02-20 ENCOUNTER — Ambulatory Visit (INDEPENDENT_AMBULATORY_CARE_PROVIDER_SITE_OTHER): Payer: Medicare HMO | Admitting: Nurse Practitioner

## 2017-02-20 ENCOUNTER — Encounter: Payer: Self-pay | Admitting: Nurse Practitioner

## 2017-02-20 VITALS — BP 112/70 | HR 73 | Temp 97.0°F | Ht 68.0 in | Wt 245.0 lb

## 2017-02-20 DIAGNOSIS — I7300 Raynaud's syndrome without gangrene: Secondary | ICD-10-CM

## 2017-02-20 DIAGNOSIS — M1A09X0 Idiopathic chronic gout, multiple sites, without tophus (tophi): Secondary | ICD-10-CM

## 2017-02-20 DIAGNOSIS — E876 Hypokalemia: Secondary | ICD-10-CM | POA: Diagnosis not present

## 2017-02-20 DIAGNOSIS — N400 Enlarged prostate without lower urinary tract symptoms: Secondary | ICD-10-CM | POA: Diagnosis not present

## 2017-02-20 DIAGNOSIS — F5101 Primary insomnia: Secondary | ICD-10-CM | POA: Diagnosis not present

## 2017-02-20 DIAGNOSIS — R739 Hyperglycemia, unspecified: Secondary | ICD-10-CM

## 2017-02-20 DIAGNOSIS — I10 Essential (primary) hypertension: Secondary | ICD-10-CM

## 2017-02-20 DIAGNOSIS — E785 Hyperlipidemia, unspecified: Secondary | ICD-10-CM | POA: Diagnosis not present

## 2017-02-20 DIAGNOSIS — I8393 Asymptomatic varicose veins of bilateral lower extremities: Secondary | ICD-10-CM | POA: Diagnosis not present

## 2017-02-20 MED ORDER — TRAMADOL HCL 50 MG PO TABS: 50.0000 mg | ORAL_TABLET | Freq: Two times a day (BID) | ORAL | 0 refills | Status: DC | PRN

## 2017-02-20 MED ORDER — ZOLPIDEM TARTRATE 10 MG PO TABS: 10.0000 mg | ORAL_TABLET | Freq: Every evening | ORAL | 0 refills | Status: DC | PRN

## 2017-02-20 MED ORDER — TRAMADOL HCL 50 MG PO TABS: 50.0000 mg | ORAL_TABLET | Freq: Two times a day (BID) | ORAL | 0 refills | Status: DC

## 2017-02-20 NOTE — Progress Notes (Signed)
Subjective:    Patient ID: Joseph [name], male    DOB: 08/02/1941, 76 y.o.   MRN: [id number]  Patient here today for follow up of chronic medical problems. No changes since last visit. No complaints today. He is not doing well today emotionally. His wife has bad liver disease and was just told she only had 3 months to live. He is having a very difficult time dealing with this.   Outpatient Encounter Prescriptions as of 02/20/2017  Medication Sig  . allopurinol (ZYLOPRIM) 300 MG tablet Take 1 tablet (300 mg total) by mouth daily.  Marland Kitchen aspirin 81 MG tablet Take 81 mg by mouth daily.  Marland Kitchen atorvastatin (LIPITOR) 40 MG tablet Take 1 tablet (40 mg total) by mouth daily.  . benazepril (LOTENSIN) 20 MG tablet Take 1 tablet (20 mg total) by mouth daily.  . clonazePAM (KLONOPIN) 0.5 MG tablet Take 1 tablet (0.5 mg total) by mouth 2 (two) times daily as needed.  . docusate sodium (COLACE) 100 MG capsule Take 100 mg by mouth daily as needed for mild constipation.  . fish oil-omega-3 fatty acids 1000 MG capsule Take 2 g by mouth daily.  . hydrochlorothiazide (HYDRODIURIL) 25 MG tablet Take 1 tablet (25 mg total) by mouth daily.  . metoprolol tartrate (LOPRESSOR) 25 MG tablet Take 0.5 tablets (12.5 mg total) by mouth 2 (two) times daily.  . Multiple Vitamins-Minerals (COMPLETE MULTIVITAMIN/MINERAL PO) Take 1 tablet by mouth daily.  Marland Kitchen NIFEdipine (PROCARDIA-XL/ADALAT-CC/NIFEDICAL-XL) 30 MG 24 hr tablet Take 1 tablet (30 mg total) by mouth daily.  . Potassium 99 MG TABS Take 1 tablet (99 mg total) by mouth daily.  . traMADol (ULTRAM) 50 MG tablet Take 1 tablet (50 mg total) by mouth 2 (two) times daily.  . traMADol (ULTRAM) 50 MG tablet Take 1 tablet (50 mg total) by mouth 2 (two) times daily.  . traMADol (ULTRAM) 50 MG tablet Take 1 tablet (50 mg total) by mouth every 12 (twelve) hours as needed.  . zolpidem (AMBIEN) 10 MG tablet Take 1 tablet (10 mg total) by mouth at bedtime as needed. for sleep   No  facility-administered encounter medications on file as of 02/20/2017.      Hyperlipidemia  This is a chronic problem. The current episode started more than 1 year ago. The problem is controlled. Recent lipid tests were reviewed and are normal. He has no history of diabetes, hypothyroidism or obesity. There are no known factors aggravating his hyperlipidemia. Pertinent negatives include no chest pain or shortness of breath. Current antihyperlipidemic treatment includes statins. The current treatment provides moderate improvement of lipids. Compliance problems include adherence to diet and adherence to exercise.  Risk factors for coronary artery disease include dyslipidemia, hypertension and obesity.  Hypertension  This is a chronic problem. The current episode started more than 1 year ago. The problem is unchanged. The problem is controlled. Pertinent negatives include no chest pain, headaches, palpitations or shortness of breath. Risk factors for coronary artery disease include dyslipidemia and male gender. Past treatments include diuretics and ACE inhibitors. The current treatment provides significant improvement. Compliance problems include diet and exercise.   Insomnia Ambien- works well - can't sleep without it BPH Pt voiding without difficulty. He stopped seeing Carmelina Noun.  GOUT  Usually in his feet- he has not have an attack for some time. Taking allopurinol 300mg .  GAD On klonopin and has been taking 2x a day for several weeks now- says that he has been very anxious lately Thoracic aortic  aneurysm Being watch by cardiology- no problems that he is aware of. He is having x ray done this afternoon for evaluation. raynauds syndrome Procardia- helps some- still has to sleep in cotton gloves hypokalemia Currently taking potassium supplements  Review of Systems  Constitutional: Negative.   HENT: Negative.   Eyes: Negative.   Respiratory: Negative.  Negative for shortness of breath.    Cardiovascular: Negative.  Negative for chest pain, palpitations and leg swelling.  Gastrointestinal: Negative.   Endocrine: Negative.   Genitourinary: Negative.   Musculoskeletal: Negative.   Skin: Negative.   Allergic/Immunologic: Negative.   Neurological: Negative.  Negative for headaches.  Hematological: Negative.   Psychiatric/Behavioral: Negative.   All other systems reviewed and are negative.      Objective:   Physical Exam  Constitutional: He is oriented to person, place, and time. Vital signs are normal. He appears well-developed and well-nourished.  HENT:  Head: Normocephalic.  Right Ear: Hearing, tympanic membrane and external ear normal.  Left Ear: External ear normal.  Nose: Nose normal.  Mouth/Throat: Uvula is midline and oropharynx is clear and moist.  Eyes: Conjunctivae and EOM are normal. Pupils are equal, round, and reactive to light.  Neck: Trachea normal and normal range of motion. Neck supple. No JVD present. No thyromegaly present.  Cardiovascular: Normal rate, regular rhythm, normal heart sounds and intact distal pulses.  Exam reveals no gallop and no friction rub.   No murmur heard. (+) allen test bil hands  Pulmonary/Chest: Effort normal and breath sounds normal. No respiratory distress. He has no wheezes. He has no rales. He exhibits no tenderness.  Abdominal: Soft. Bowel sounds are normal. He exhibits no mass. There is no tenderness.  Musculoskeletal: Normal range of motion. He exhibits no edema.  Lymphadenopathy:    He has no cervical adenopathy.  Neurological: He is alert and oriented to person, place, and time. No cranial nerve deficit.  Skin: Skin is warm, dry and intact.  Psychiatric: He has a normal mood and affect. His speech is normal and behavior is normal. Judgment and thought content normal. Cognition and memory are normal.    BP 112/70   Pulse 73   Temp 97 F (36.1 C) (Oral)   Ht 5\' 8"  (1.727 m)   Wt 245 lb (111.1 kg)   BMI 37.25  kg/m       Assessment & Plan:   1. Essential hypertension, benign   2. Raynaud's disease without gangrene   3. Primary insomnia   4. Varicose veins of both lower extremities   5. Benign prostatic hyperplasia without lower urinary tract symptoms   6. Hypokalemia   7. Severe obesity (BMI >= 40) (HCC)   8. Hyperlipidemia with target LDL less than 100   9. Idiopathic chronic gout of multiple sites without tophus     Meds ordered this encounter  Medications  . traMADol (ULTRAM) 50 MG tablet    Sig: Take 1 tablet (50 mg total) by mouth 2 (two) times daily.    Dispense:  60 tablet    Refill:  0    Order Specific Question:   Supervising Provider    Answer:   VINCENT, CAROL L [[id number]]  . traMADol (ULTRAM) 50 MG tablet    Sig: Take 1 tablet (50 mg total) by mouth 2 (two) times daily.    Dispense:  60 tablet    Refill:  0    DO NOT FILL TILL 03/22/17    Order Specific Question:   Supervising  Provider    Answer:   Eustaquio Maize [[id number]]  . traMADol (ULTRAM) 50 MG tablet    Sig: Take 1 tablet (50 mg total) by mouth every 12 (twelve) hours as needed.    Dispense:  60 tablet    Refill:  0    DO NOT FILL UNTIL 04/20/17    Order Specific Question:   Supervising Provider    Answer:   Assunta Found L [[id number]]  . zolpidem (AMBIEN) 10 MG tablet    Sig: Take 1 tablet (10 mg total) by mouth at bedtime as needed. for sleep    Dispense:  30 tablet    Refill:  0    Order Specific Question:   Supervising Provider    Answer:   Eustaquio Maize [[id number]]    Stress management Bedtime routine- do not  Take extra ambien- may add melatonin Labs pending Health maintenance reviewed Diet and exercise encouraged Continue all meds Follow up  In 3 months   Aneth, FNP

## 2017-02-20 NOTE — Addendum Note (Signed)
Addended by: Rolena Infante on: 02/20/2017 03:17 PM   Modules accepted: Orders

## 2017-02-20 NOTE — Patient Instructions (Signed)
Stress and Stress Management Stress is a normal reaction to life events. It is what you feel when life demands more than you are used to or more than you can handle. Some stress can be useful. For example, the stress reaction can help you catch the last bus of the day, study for a test, or meet a deadline at work. But stress that occurs too often or for too long can cause problems. It can affect your emotional health and interfere with relationships and normal daily activities. Too much stress can weaken your immune system and increase your risk for physical illness. If you already have a medical problem, stress can make it worse. What are the causes? All sorts of life events may cause stress. An event that causes stress for one person may not be stressful for another person. Major life events commonly cause stress. These may be positive or negative. Examples include losing your job, moving into a new home, getting married, having a baby, or losing a loved one. Less obvious life events may also cause stress, especially if they occur day after day or in combination. Examples include working long hours, driving in traffic, caring for children, being in debt, or being in a difficult relationship. What are the signs or symptoms? Stress may cause emotional symptoms including, the following:  Anxiety. This is feeling worried, afraid, on edge, overwhelmed, or out of control.  Anger. This is feeling irritated or impatient.  Depression. This is feeling sad, down, helpless, or guilty.  Difficulty focusing, remembering, or making decisions. Stress may cause physical symptoms, including the following:  Aches and pains. These may affect your head, neck, back, stomach, or other areas of your body.  Tight muscles or clenched jaw.  Low energy or trouble sleeping. Stress may cause unhealthy behaviors, including the following:  Eating to feel better (overeating) or skipping meals.  Sleeping too little, too  much, or both.  Working too much or putting off tasks (procrastination).  Smoking, drinking alcohol, or using drugs to feel better. How is this diagnosed? Stress is diagnosed through an assessment by your health care provider. Your health care provider will ask questions about your symptoms and any stressful life events.Your health care provider will also ask about your medical history and may order blood tests or other tests. Certain medical conditions and medicine can cause physical symptoms similar to stress. Mental illness can cause emotional symptoms and unhealthy behaviors similar to stress. Your health care provider may refer you to a mental health professional for further evaluation. How is this treated? Stress management is the recommended treatment for stress.The goals of stress management are reducing stressful life events and coping with stress in healthy ways. Techniques for reducing stressful life events include the following:  Stress identification. Self-monitor for stress and identify what causes stress for you. These skills may help you to avoid some stressful events.  Time management. Set your priorities, keep a calendar of events, and learn to say "no." These tools can help you avoid making too many commitments. Techniques for coping with stress include the following:  Rethinking the problem. Try to think realistically about stressful events rather than ignoring them or overreacting. Try to find the positives in a stressful situation rather than focusing on the negatives.  Exercise. Physical exercise can release both physical and emotional tension. The key is to find a form of exercise you enjoy and do it regularly.  Relaxation techniques. These relax the body and mind. Examples include yoga,  meditation, tai chi, biofeedback, deep breathing, progressive muscle relaxation, listening to music, being out in nature, journaling, and other hobbies. Again, the key is to find one or  more that you enjoy and can do regularly.  Healthy lifestyle. Eat a balanced diet, get plenty of sleep, and do not smoke. Avoid using alcohol or drugs to relax.  Strong support network. Spend time with family, friends, or other people you enjoy being around.Express your feelings and talk things over with someone you trust. Counseling or talktherapy with a mental health professional may be helpful if you are having difficulty managing stress on your own. Medicine is typically not recommended for the treatment of stress.Talk to your health care provider if you think you need medicine for symptoms of stress. Follow these instructions at home:  Keep all follow-up visits as directed by your health care provider.  Take all medicines as directed by your health care provider. Contact a health care provider if:  Your symptoms get worse or you start having new symptoms.  You feel overwhelmed by your problems and can no longer manage them on your own. Get help right away if:  You feel like hurting yourself or someone else. This information is not intended to replace advice given to you by your health care provider. Make sure you discuss any questions you have with your health care provider. Document Released: 05/13/2001 Document Revised: 04/24/2016 Document Reviewed: 07/12/2013 Elsevier Interactive Patient Education  2017 [employer].

## 2017-02-21 LAB — CMP14+EGFR
A/G RATIO: 1.5 (ref 1.2–2.2)
ALT: 23 IU/L (ref 0–44)
AST: 24 IU/L (ref 0–40)
Albumin: 4.3 g/dL (ref 3.5–4.8)
Alkaline Phosphatase: 68 IU/L (ref 39–117)
BILIRUBIN TOTAL: 0.2 mg/dL (ref 0.0–1.2)
BUN/Creatinine Ratio: 17 (ref 10–24)
BUN: 16 mg/dL (ref 8–27)
CHLORIDE: 99 mmol/L (ref 96–106)
CO2: 22 mmol/L (ref 18–29)
Calcium: 9.7 mg/dL (ref 8.6–10.2)
Creatinine, Ser: 0.95 mg/dL (ref 0.76–1.27)
GFR calc non Af Amer: 79 mL/min/{1.73_m2} (ref >59)
GFR, EST AFRICAN AMERICAN: 91 mL/min/{1.73_m2} (ref >59)
GLOBULIN, TOTAL: 2.8 g/dL (ref 1.5–4.5)
Glucose: 108 mg/dL — ABNORMAL HIGH (ref 65–99)
POTASSIUM: 4.1 mmol/L (ref 3.5–5.2)
SODIUM: 140 mmol/L (ref 134–144)
TOTAL PROTEIN: 7.1 g/dL (ref 6.0–8.5)

## 2017-02-21 LAB — LIPID PANEL
Chol/HDL Ratio: 2.9 ratio units (ref 0.0–5.0)
Cholesterol, Total: 132 mg/dL (ref 100–199)
HDL: 45 mg/dL (ref >39)
LDL Calculated: 60 mg/dL (ref 0–99)
Triglycerides: 134 mg/dL (ref 0–149)
VLDL Cholesterol Cal: 27 mg/dL (ref 5–40)

## 2017-02-21 LAB — PSA, TOTAL AND FREE
PROSTATE SPECIFIC AG, SERUM: 3 ng/mL (ref 0.0–4.0)
PSA FREE PCT: 20.7 %
PSA FREE: 0.62 ng/mL

## 2017-02-23 ENCOUNTER — Other Ambulatory Visit: Payer: Self-pay | Admitting: Nurse Practitioner

## 2017-02-23 DIAGNOSIS — R739 Hyperglycemia, unspecified: Secondary | ICD-10-CM

## 2017-02-23 LAB — BAYER DCA HB A1C WAIVED: HB A1C (BAYER DCA - WAIVED): 5.7 % (ref <7.0)

## 2017-02-23 NOTE — Addendum Note (Signed)
Addended by: Earlene Plater on: 02/23/2017 10:02 AM   Modules accepted: Orders

## 2017-02-25 ENCOUNTER — Ambulatory Visit: Payer: Medicare HMO | Admitting: Nurse Practitioner

## 2017-02-26 ENCOUNTER — Ambulatory Visit: Payer: Commercial Managed Care - HMO | Admitting: Nurse Practitioner

## 2017-03-02 DIAGNOSIS — K006 Disturbances in tooth eruption: Secondary | ICD-10-CM | POA: Diagnosis not present

## 2017-03-15 ENCOUNTER — Other Ambulatory Visit: Payer: Self-pay | Admitting: Nurse Practitioner

## 2017-03-15 DIAGNOSIS — F411 Generalized anxiety disorder: Secondary | ICD-10-CM

## 2017-03-16 ENCOUNTER — Other Ambulatory Visit: Payer: Self-pay | Admitting: Nurse Practitioner

## 2017-03-16 DIAGNOSIS — F411 Generalized anxiety disorder: Secondary | ICD-10-CM

## 2017-03-16 MED ORDER — CLONAZEPAM 0.5 MG PO TABS: 0.5000 mg | ORAL_TABLET | Freq: Two times a day (BID) | ORAL | 1 refills | Status: DC | PRN

## 2017-03-16 NOTE — Telephone Encounter (Signed)
What is the name of the medication? clonazePAM (KLONOPIN) 0.5 MG tablet   Have you contacted your pharmacy to request a refill?yes  Which pharmacy would you like this sent to? 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 If they do not receive a call within 24 hours they can check with their pharmacy or our office.

## 2017-03-16 NOTE — Telephone Encounter (Signed)
Please call in clonazepam with 1 refills 

## 2017-03-16 NOTE — Telephone Encounter (Signed)
Last filled 02/26/17, last seen 02/20/17. Call in

## 2017-04-13 ENCOUNTER — Other Ambulatory Visit: Payer: Self-pay | Admitting: Nurse Practitioner

## 2017-04-13 DIAGNOSIS — F411 Generalized anxiety disorder: Secondary | ICD-10-CM

## 2017-04-18 ENCOUNTER — Other Ambulatory Visit: Payer: Self-pay | Admitting: Pediatrics

## 2017-05-18 ENCOUNTER — Other Ambulatory Visit: Payer: Self-pay | Admitting: Nurse Practitioner

## 2017-05-18 DIAGNOSIS — I7300 Raynaud's syndrome without gangrene: Secondary | ICD-10-CM

## 2017-05-19 ENCOUNTER — Other Ambulatory Visit: Payer: Self-pay | Admitting: Family

## 2017-05-19 ENCOUNTER — Other Ambulatory Visit: Payer: Self-pay | Admitting: Nurse Practitioner

## 2017-05-19 DIAGNOSIS — I7300 Raynaud's syndrome without gangrene: Secondary | ICD-10-CM

## 2017-05-19 MED ORDER — TRAMADOL HCL 50 MG PO TABS: 50.0000 mg | ORAL_TABLET | Freq: Two times a day (BID) | ORAL | 0 refills | Status: DC

## 2017-05-19 NOTE — Telephone Encounter (Signed)
Chart review there was an approved printed refill

## 2017-05-19 NOTE — Progress Notes (Signed)
Prescription placed at front desk for patient pick up.

## 2017-05-19 NOTE — Progress Notes (Signed)
Pt requesting Ultram rx. PCP is out of town and has follow up appt 07/06. Will give him one month.

## 2017-06-05 ENCOUNTER — Ambulatory Visit (INDEPENDENT_AMBULATORY_CARE_PROVIDER_SITE_OTHER): Payer: Medicare HMO | Admitting: Nurse Practitioner

## 2017-06-05 ENCOUNTER — Encounter: Payer: Self-pay | Admitting: Nurse Practitioner

## 2017-06-05 VITALS — BP 114/73 | HR 62 | Temp 97.0°F | Ht 68.0 in | Wt 245.0 lb

## 2017-06-05 DIAGNOSIS — M1A09X0 Idiopathic chronic gout, multiple sites, without tophus (tophi): Secondary | ICD-10-CM

## 2017-06-05 DIAGNOSIS — I7300 Raynaud's syndrome without gangrene: Secondary | ICD-10-CM

## 2017-06-05 DIAGNOSIS — L989 Disorder of the skin and subcutaneous tissue, unspecified: Secondary | ICD-10-CM | POA: Diagnosis not present

## 2017-06-05 DIAGNOSIS — I8393 Asymptomatic varicose veins of bilateral lower extremities: Secondary | ICD-10-CM

## 2017-06-05 DIAGNOSIS — F411 Generalized anxiety disorder: Secondary | ICD-10-CM

## 2017-06-05 DIAGNOSIS — I712 Thoracic aortic aneurysm, without rupture: Secondary | ICD-10-CM | POA: Diagnosis not present

## 2017-06-05 DIAGNOSIS — H6191 Disorder of right external ear, unspecified: Secondary | ICD-10-CM

## 2017-06-05 DIAGNOSIS — E876 Hypokalemia: Secondary | ICD-10-CM | POA: Diagnosis not present

## 2017-06-05 DIAGNOSIS — F5101 Primary insomnia: Secondary | ICD-10-CM

## 2017-06-05 DIAGNOSIS — I10 Essential (primary) hypertension: Secondary | ICD-10-CM

## 2017-06-05 DIAGNOSIS — E785 Hyperlipidemia, unspecified: Secondary | ICD-10-CM

## 2017-06-05 DIAGNOSIS — H6192 Disorder of left external ear, unspecified: Secondary | ICD-10-CM

## 2017-06-05 DIAGNOSIS — I7121 Aneurysm of the ascending aorta, without rupture: Secondary | ICD-10-CM

## 2017-06-05 DIAGNOSIS — N400 Enlarged prostate without lower urinary tract symptoms: Secondary | ICD-10-CM

## 2017-06-05 MED ORDER — TRAMADOL HCL 50 MG PO TABS: 50.0000 mg | ORAL_TABLET | Freq: Two times a day (BID) | ORAL | 0 refills | Status: DC

## 2017-06-05 MED ORDER — NIFEDIPINE ER OSMOTIC RELEASE 30 MG PO TB24: 30.0000 mg | ORAL_TABLET | Freq: Every day | ORAL | 1 refills | Status: DC

## 2017-06-05 MED ORDER — BENAZEPRIL HCL 20 MG PO TABS: 20.0000 mg | ORAL_TABLET | Freq: Every day | ORAL | 1 refills | Status: DC

## 2017-06-05 MED ORDER — ALLOPURINOL 300 MG PO TABS: 300.0000 mg | ORAL_TABLET | Freq: Every day | ORAL | 1 refills | Status: DC

## 2017-06-05 MED ORDER — CLONAZEPAM 0.5 MG PO TABS: 0.5000 mg | ORAL_TABLET | Freq: Two times a day (BID) | ORAL | 2 refills | Status: DC | PRN

## 2017-06-05 MED ORDER — ATORVASTATIN CALCIUM 40 MG PO TABS: 40.0000 mg | ORAL_TABLET | Freq: Every day | ORAL | 3 refills | Status: DC

## 2017-06-05 MED ORDER — METOPROLOL TARTRATE 25 MG PO TABS: 12.5000 mg | ORAL_TABLET | Freq: Two times a day (BID) | ORAL | 3 refills | Status: DC

## 2017-06-05 MED ORDER — POTASSIUM 99 MG PO TABS: 1.0000 | ORAL_TABLET | Freq: Every day | ORAL | 1 refills | Status: DC

## 2017-06-05 MED ORDER — HYDROCHLOROTHIAZIDE 25 MG PO TABS: 25.0000 mg | ORAL_TABLET | Freq: Every day | ORAL | 3 refills | Status: DC

## 2017-06-05 MED ORDER — ZOLPIDEM TARTRATE 10 MG PO TABS: 10.0000 mg | ORAL_TABLET | Freq: Every evening | ORAL | 2 refills | Status: DC | PRN

## 2017-06-05 MED ORDER — TRAMADOL HCL 50 MG PO TABS: 50.0000 mg | ORAL_TABLET | Freq: Two times a day (BID) | ORAL | 0 refills | Status: DC | PRN

## 2017-06-05 NOTE — Patient Instructions (Signed)

## 2017-06-05 NOTE — Progress Notes (Signed)
Subjective:    Patient ID: Joseph [name], male    DOB: 04/20/1941, 76 y.o.   MRN: [id number]  HPI Joseph [name] is here today for follow up of chronic medical problem.  Outpatient Encounter Prescriptions as of 06/05/2017  Medication Sig  . allopurinol (ZYLOPRIM) 300 MG tablet TAKE 1 TABLET BY MOUTH ONCE DAILY  . aspirin 81 MG tablet Take 81 mg by mouth daily.  Marland Kitchen atorvastatin (LIPITOR) 40 MG tablet Take 1 tablet (40 mg total) by mouth daily.  . benazepril (LOTENSIN) 20 MG tablet Take 1 tablet (20 mg total) by mouth daily.  . clonazePAM (KLONOPIN) 0.5 MG tablet Take 1 tablet (0.5 mg total) by mouth 2 (two) times daily as needed.  . docusate sodium (COLACE) 100 MG capsule Take 100 mg by mouth daily as needed for mild constipation.  . fish oil-omega-3 fatty acids 1000 MG capsule Take 2 g by mouth daily.  . hydrochlorothiazide (HYDRODIURIL) 25 MG tablet Take 1 tablet (25 mg total) by mouth daily.  . metoprolol tartrate (LOPRESSOR) 25 MG tablet Take 0.5 tablets (12.5 mg total) by mouth 2 (two) times daily.  . Multiple Vitamins-Minerals (COMPLETE MULTIVITAMIN/MINERAL PO) Take 1 tablet by mouth daily.  Marland Kitchen NIFEdipine (PROCARDIA-XL/ADALAT-CC/NIFEDICAL-XL) 30 MG 24 hr tablet Take 1 tablet (30 mg total) by mouth daily.  . Potassium 99 MG TABS Take 1 tablet (99 mg total) by mouth daily.  . traMADol (ULTRAM) 50 MG tablet Take 1 tablet (50 mg total) by mouth 2 (two) times daily.  . traMADol (ULTRAM) 50 MG tablet Take 1 tablet (50 mg total) by mouth every 12 (twelve) hours as needed.  . traMADol (ULTRAM) 50 MG tablet Take 1 tablet (50 mg total) by mouth 2 (two) times daily.  Marland Kitchen zolpidem (AMBIEN) 10 MG tablet Take 1 tablet (10 mg total) by mouth at bedtime as needed. for sleep   No facility-administered encounter medications on file as of 06/05/2017.     1. Essential hypertension, benign  Patient blood pressure managed with benazepril, HCTZ, metoprolol.  Patient checks blood pressure once weekly and states it  is usually 120s/60s.  2. Raynaud's disease without gangrene  Procardia used to help with symptoms.  Patient states his fingers are numb.  3. Thoracic ascending aortic aneurysm ([place])  Monitored by cardiology.  No current problems.  Patient states he needs to call and make an appointment.  4. Varicose veins of both lower extremities  Elevates legs when sitting.  5. Benign prostatic hyperplasia without lower urinary tract symptoms  Patient voiding without difficulty.    6. Hyperlipidemia with target LDL less than 100  Patient takes atorvastatin for control.  Chronic medical condition that is currently controlled.  7. Idiopathic chronic gout of multiple sites without tophus  Taking allopurinol.  No attacks recently.  8. Severe obesity (BMI >= 40) (HCC)  No recent weight gain or loss.  9. Hypokalemia  Potassium supplementation provided.  10. Primary insomnia  Sleeping well with ambien nightly.    New complaints: Patient has new lesions on bilateral ears.     Review of Systems  Constitutional: Negative for activity change, appetite change and fatigue.  Respiratory: Negative for shortness of breath.   Cardiovascular: Negative for chest pain and palpitations.  Gastrointestinal: Negative for abdominal distention and abdominal pain.  Neurological: Negative for dizziness and headaches.  Psychiatric/Behavioral: Positive for dysphoric mood (wife passed away May 08, 2017).  All other systems reviewed and are negative.      Objective:   Physical  Exam  Constitutional: He is oriented to person, place, and time. He appears well-developed and well-nourished. No distress.  HENT:  Head: Normocephalic.  Right Ear: External ear normal.  Left Ear: External ear normal.  Nose: Nose normal.  Eyes: Pupils are equal, round, and reactive to light.  Neck: Normal range of motion. Neck supple. No JVD present. No thyromegaly present.  Cardiovascular: Normal rate, regular rhythm, normal heart sounds and intact  distal pulses.   No murmur heard. Pulmonary/Chest: Effort normal and breath sounds normal. No respiratory distress. He has no wheezes.  Abdominal: Soft. Bowel sounds are normal. He exhibits no distension. There is no tenderness.  Musculoskeletal: Normal range of motion.  Lymphadenopathy:    He has no cervical adenopathy.  Neurological: He is alert and oriented to person, place, and time.  Skin: Skin is warm and dry.  Blackish grey crusty lesion on auricle of right ear and larger area on auricle of left ear.  Psychiatric: He has a normal mood and affect. His behavior is normal. Judgment and thought content normal.   BP 114/73   Pulse 62   Temp (!) 97 F (36.1 C) (Oral)   Ht '5\' 8"'  (1.727 m)   Wt 245 lb (111.1 kg)   BMI 37.25 kg/m       Assessment & Plan:  1. Essential hypertension, benign Low sodium diet - hydrochlorothiazide (HYDRODIURIL) 25 MG tablet; Take 1 tablet (25 mg total) by mouth daily.  Dispense: 90 tablet; Refill: 3 - benazepril (LOTENSIN) 20 MG tablet; Take 1 tablet (20 mg total) by mouth daily.  Dispense: 90 tablet; Refill: 1 - metoprolol tartrate (LOPRESSOR) 25 MG tablet; Take 0.5 tablets (12.5 mg total) by mouth 2 (two) times daily.  Dispense: 90 tablet; Refill: 3 - CMP14+EGFR  2. Raynaud's disease without gangrene Keep fingrs warm - traMADol (ULTRAM) 50 MG tablet; Take 1 tablet (50 mg total) by mouth every 12 (twelve) hours as needed.  Dispense: 60 tablet; Refill: 0 - traMADol (ULTRAM) 50 MG tablet; Take 1 tablet (50 mg total) by mouth 2 (two) times daily.  Dispense: 60 tablet; Refill: 0 - traMADol (ULTRAM) 50 MG tablet; Take 1 tablet (50 mg total) by mouth 2 (two) times daily.  Dispense: 60 tablet; Refill: 0  3. Thoracic ascending aortic aneurysm ([place]) Follow up with specialist at least every 6 months - NIFEdipine (PROCARDIA-XL/ADALAT-CC/NIFEDICAL-XL) 30 MG 24 hr tablet; Take 1 tablet (30 mg total) by mouth daily.  Dispense: 90 tablet; Refill: 1  4. Varicose  veins of both lower extremities Compression socks when up on feet alot  5. Benign prostatic hyperplasia without lower urinary tract symptoms Keep follow ups with urology  6. Hyperlipidemia with target LDL less than 100 Low fat diet - atorvastatin (LIPITOR) 40 MG tablet; Take 1 tablet (40 mg total) by mouth daily.  Dispense: 90 tablet; Refill: 3 - Lipid panel  7. Idiopathic chronic gout of multiple sites without tophus - allopurinol (ZYLOPRIM) 300 MG tablet; Take 1 tablet (300 mg total) by mouth daily.  Dispense: 90 tablet; Refill: 1  8. Severe obesity (BMI >= 40) (HCC) Discussed diet and exercise for person with BMI >25 Will recheck weight in 3-6 months  9. Hypokalemia - Potassium 99 MG TABS; Take 1 tablet (99 mg total) by mouth daily.  Dispense: 330 each; Refill: 1  10. Primary insomnia Bedtime routine - zolpidem (AMBIEN) 10 MG tablet; Take 1 tablet (10 mg total) by mouth at bedtime as needed. for sleep  Dispense: 30 tablet;  Refill: 2  11. Skin lesion of left ear Do not pick or scratch at area - Ambulatory referral to Dermatology  12. Skin lesion of right ear Do not pick or cratch at area - Ambulatory referral to Dermatology  13. GAD (generalized anxiety disorder) Grief management - clonazePAM (KLONOPIN) 0.5 MG tablet; Take 1 tablet (0.5 mg total) by mouth 2 (two) times daily as needed.  Dispense: 60 tablet; Refill: 2    Labs pending Health maintenance reviewed Diet and exercise encouraged Continue all meds Follow up  In 3 months   [place], FNP

## 2017-06-06 LAB — LIPID PANEL
CHOL/HDL RATIO: 3.4 ratio (ref 0.0–5.0)
Cholesterol, Total: 142 mg/dL (ref 100–199)
HDL: 42 mg/dL (ref >39)
LDL CALC: 73 mg/dL (ref 0–99)
Triglycerides: 133 mg/dL (ref 0–149)
VLDL CHOLESTEROL CAL: 27 mg/dL (ref 5–40)

## 2017-06-06 LAB — CMP14+EGFR
ALBUMIN: 4.2 g/dL (ref 3.5–4.8)
ALT: 24 IU/L (ref 0–44)
AST: 22 IU/L (ref 0–40)
Albumin/Globulin Ratio: 1.7 (ref 1.2–2.2)
Alkaline Phosphatase: 66 IU/L (ref 39–117)
BUN / CREAT RATIO: 15 (ref 10–24)
BUN: 15 mg/dL (ref 8–27)
Bilirubin Total: 0.4 mg/dL (ref 0.0–1.2)
CALCIUM: 9.9 mg/dL (ref 8.6–10.2)
CO2: 26 mmol/L (ref 20–29)
CREATININE: 1.02 mg/dL (ref 0.76–1.27)
Chloride: 98 mmol/L (ref 96–106)
GFR, EST AFRICAN AMERICAN: 83 mL/min/{1.73_m2} (ref >59)
GFR, EST NON AFRICAN AMERICAN: 72 mL/min/{1.73_m2} (ref >59)
GLOBULIN, TOTAL: 2.5 g/dL (ref 1.5–4.5)
Glucose: 107 mg/dL — ABNORMAL HIGH (ref 65–99)
Potassium: 4.1 mmol/L (ref 3.5–5.2)
SODIUM: 138 mmol/L (ref 134–144)
TOTAL PROTEIN: 6.7 g/dL (ref 6.0–8.5)

## 2017-07-07 ENCOUNTER — Other Ambulatory Visit: Payer: Self-pay | Admitting: Dermatology

## 2017-07-07 DIAGNOSIS — D229 Melanocytic nevi, unspecified: Secondary | ICD-10-CM | POA: Diagnosis not present

## 2017-07-07 DIAGNOSIS — L570 Actinic keratosis: Secondary | ICD-10-CM | POA: Diagnosis not present

## 2017-07-07 DIAGNOSIS — L821 Other seborrheic keratosis: Secondary | ICD-10-CM | POA: Diagnosis not present

## 2017-07-07 DIAGNOSIS — D492 Neoplasm of unspecified behavior of bone, soft tissue, and skin: Secondary | ICD-10-CM | POA: Diagnosis not present

## 2017-07-09 ENCOUNTER — Other Ambulatory Visit: Payer: Self-pay | Admitting: *Deleted

## 2017-07-09 DIAGNOSIS — I712 Thoracic aortic aneurysm, without rupture, unspecified: Secondary | ICD-10-CM

## 2017-07-15 NOTE — Progress Notes (Signed)
Joseph [name]       [place] [address]             [phone]                    Aarya F Hollenberg [facility] Medical Record #[id number] Date of Birth: 09/22/1941  Referring: Hinda Lenis Primary Care: Chevis Pretty, FNP  Chief Complaint:    Chief Complaint  Patient presents with  . Thoracic Aortic Aneurysm    8 month f/u with Chest CTA    History of Present Illness:    Joseph [name] 76 y.o. male is seen in the office in follow-up. The patient notes that on June 04 2015 holiday he developed epigastric and substernal chest pain radiating to the left neck into both arms, with numbness in his hands. At the time he was camping at [facility] and went to the [facility] emergency room and was admitted. He was hospitalized for several days.  The patient did have a CT scan of the chest which revealed a 4.8 cm dilatation of the ascending aorta. On discharge the patient was told to see a thoracic surgeon. He has had serial CT od chest here since that time and returns today with repeat scan .  Since last seen the patient's wife passed away from liver failure several months ago, his brother died last week with cardiac disease and colon cancer. The patient notes he has had increase stress over the past months.   He complains of new hand tremor bilaterally   Current Activity/ Functional Status:  Patient is independent with mobility/ambulation, transfers, ADL's, IADL's.   Zubrod Score: At the time of surgery this patient's most appropriate activity status/level should be described as: []     0    Normal activity, no symptoms [x]     1    Restricted in physical strenuous activity but ambulatory, able to do out light work []     2    Ambulatory and capable of self care, unable to do work activities, up and about               >50 % of waking hours                              []     3    Only limited self care, in bed greater than 50% of waking hours []     4     Completely disabled, no self care, confined to bed or chair []     5    Moribund   Past Medical History:  Diagnosis Date  . Anxiety   . Ascending aortic aneurysm ([place])   . Essential hypertension   . GERD (gastroesophageal reflux disease)   . Gout   . History of kidney stones   . Insomnia     Past Surgical History:  Procedure Laterality Date  . CARDIAC CATHETERIZATION N/A 06/27/2015   Procedure: Left Heart Cath and Coronary Angiography;  Surgeon: Troy Sine, MD;  Location: [facility];  Service: Cardiovascular;  Laterality: N/A;  . HERNIA REPAIR    . KIDNEY STONE SURGERY      Family History  Problem Relation Age of Onset  . Heart disease Father   . Diabetes Sister   . Heart disease Brother   . Deep vein thrombosis Brother   . Congestive Heart Failure  Brother    patient has history of sudden death of his father at age 75 and his brother at age 64-  these were attributed to myocardial infarctions but there is no documented autopsy . The patient notes both of them died at home suddenly Patient's brother has died of heart failure and colon cancer last week   Social History   Social History  . Marital status: Married    Spouse name: N/A  . Number of children: N/A  . Years of education: N/A   Occupational History  . Not on file.   Social History Main Topics  . Smoking status: Former Smoker    Packs/day: 0.50    Years: 1.00    Types: Cigarettes    Quit date: 03/22/1958  . Smokeless tobacco: Former Systems developer    Types: Chew     Comment: [place]  . Alcohol use No  . Drug use: No  . Sexual activity: No   Other Topics Concern  . Not on file   Social History Narrative  . No narrative on file    History  Smoking Status  . Former Smoker  . Packs/day: 0.50  . Years: 1.00  . Types: Cigarettes  . Quit date: 03/22/1958  Smokeless Tobacco  . Former Systems developer  . Types: Chew    Comment: QUIT CHEWING IN 1989    History  Alcohol Use No     Allergies    Allergen Reactions  . Carafate [Sucralfate] Other (See Comments)    TOOK SKIN FROM HIS MOUTH  . Ciprofloxacin Hcl Other (See Comments)    CAN'T REMEMBER  . Prevacid [Lansoprazole] Other (See Comments)    MADE HIM FEEL BAD  . Sulfa Antibiotics Diarrhea    SEVERE    Current Outpatient Prescriptions  Medication Sig Dispense Refill  . allopurinol (ZYLOPRIM) 300 MG tablet Take 1 tablet (300 mg total) by mouth daily. 90 tablet 1  . atorvastatin (LIPITOR) 40 MG tablet Take 1 tablet (40 mg total) by mouth daily. 90 tablet 3  . benazepril (LOTENSIN) 20 MG tablet Take 1 tablet (20 mg total) by mouth daily. 90 tablet 1  . clonazePAM (KLONOPIN) 0.5 MG tablet Take 1 tablet (0.5 mg total) by mouth 2 (two) times daily as needed. 60 tablet 2  . docusate sodium (COLACE) 100 MG capsule Take 100 mg by mouth daily as needed for mild constipation.    . fish oil-omega-3 fatty acids 1000 MG capsule Take 2 g by mouth daily.    . hydrochlorothiazide (HYDRODIURIL) 25 MG tablet Take 1 tablet (25 mg total) by mouth daily. 90 tablet 3  . metoprolol tartrate (LOPRESSOR) 25 MG tablet Take 0.5 tablets (12.5 mg total) by mouth 2 (two) times daily. 90 tablet 3  . Multiple Vitamins-Minerals (COMPLETE MULTIVITAMIN/MINERAL PO) Take 1 tablet by mouth daily.    Marland Kitchen NIFEdipine (PROCARDIA-XL/ADALAT-CC/NIFEDICAL-XL) 30 MG 24 hr tablet Take 1 tablet (30 mg total) by mouth daily. 90 tablet 1  . Potassium 99 MG TABS Take 1 tablet (99 mg total) by mouth daily. 330 each 1  . traMADol (ULTRAM) 50 MG tablet Take 1 tablet (50 mg total) by mouth 2 (two) times daily. 60 tablet 0  . zolpidem (AMBIEN) 10 MG tablet Take 1 tablet (10 mg total) by mouth at bedtime as needed. for sleep 30 tablet 2   No current facility-administered medications for this visit.       Review of Systems:     Cardiac Review of Systems:  Y or N  Chest Pain [ y ]  Resting SOB [ y  ] Exertional SOB  Blue.Reese  ]  Orthopnea Blue.Reese ]   Pedal Edema Blue.Reese   ]    Palpitations n  ] Syncope  Florencio.Farrier  ]   Presyncope [y]  General Review of Systems: [Y] = yes [  ]=no Constitional: recent weight change [  ];  Wt loss over the last 3 months [   ] anorexia [  ]; fatigue Blue.Reese  ]; nausea [  ]; night sweats [n  ]; fever [  ]; or chills [  ];          Dental: poor dentition[  ]; Last Dentist visit:   Eye : blurred vision [  ]; diplopia [   ]; vision changes [  ];  Amaurosis fugax[  ]; Resp: cough [  ];  wheezing[ n ];  hemoptysis[n  ]; shortness of breathy[y  ]; paroxysmal nocturnal dyspnea[  ]; dyspnea on exertion[  ]; or orthopnea[  ];  GI:  gallstones[  ], vomiting[  ];  dysphagia[  ]; melena[  ];  hematochezia [  ]; heartburn[  ];   Hx of  Colonoscopy[ pylops removed ?10 years ago ]; GU: kidney stones [  ]; hematuria[  ];   dysuria [  ];  nocturia[  ];  history of     obstruction [  ]; urinary frequency [  ]             Skin: rash, swelling[  ];, hair loss[  ];  peripheral edema[  ];  or itching[  ]; Musculosketetal: myalgias[  ];  joint swelling[  ];  joint erythema[  ];  joint pain[  ];  back pain[  ];  Heme/Lymph: bruising[  ];  bleeding[  ];  anemia[  ];  Neuro: TIA[n  ];  headaches[ n ];  stroke[ n ];  vertigo[  ];  seizures[  ];   paresthesias[  ];  difficulty walking[  ];  Psych:depression[  ]; anxiety[  ];  Endocrine: diabetes[ n ];  thyroid dysfunction[n  ];  Immunizations: Flu up to date Blue.Reese  ]; Pneumococcal up to date Blue.Reese  ];  Other:  Physical Exam: BP 117/72   Pulse 80   Resp 20   Ht 5\' 8"  (1.727 m)   Wt 251 lb (113.9 kg)   SpO2 94% Comment: RA  BMI 38.16 kg/m   PHYSICAL EXAMINATION: General appearance: alert, cooperative, appears older than stated age, no distress and morbidly obese Head: Normocephalic, without obvious abnormality, atraumatic Neck: no adenopathy, no carotid bruit, no JVD, supple, symmetrical, trachea midline and thyroid not enlarged, symmetric, no tenderness/mass/nodules Lymph nodes: Cervical, supraclavicular, and axillary nodes normal. Resp:  clear to auscultation bilaterally Back: symmetric, no curvature. ROM normal. No CVA tenderness. Cardio: regular rate and rhythm, S1, S2 normal, no murmur, click, rub or gallop GI: soft, non-tender; bowel sounds normal; no masses,  no organomegaly Extremities: extremities normal, atraumatic, no cyanosis or edema and Homans sign is negative, no sign of DVT Neurologic: Grossly normal severe varicose veins in both lower extremities below the knee, palpable DP and PT pulses bilaterally No  murmur of aortic insufficiency.   Diagnostic Studies & Laboratory data:     Recent Radiology Findings:   Ct Angio Chest Aorta W/cm &/or Wo/cm  Result Date: 07/16/2017 CLINICAL DATA:  Thoracic aortic aneurysm without rupture. EXAM: CT ANGIOGRAPHY CHEST WITH CONTRAST TECHNIQUE: Multidetector CT imaging of  the chest was performed using the standard protocol during bolus administration of intravenous contrast. Multiplanar CT image reconstructions and MIPs were obtained to evaluate the vascular anatomy. CONTRAST:  75 mL of Isovue 370 intravenously. COMPARISON:  CT scan of August 28, 2016. FINDINGS: Cardiovascular: Stable 4.9 cm ascending thoracic aortic aneurysm is noted without rupture. Atherosclerosis of thoracic aorta is noted. Great vessels are widely patent without significant stenosis. No pericardial effusion is noted. Transverse aortic arch measures 3.0 cm. Proximal descending thoracic aorta measures 3.0 cm. Mediastinum/Nodes: No enlarged mediastinal, hilar, or axillary lymph nodes. Thyroid gland, trachea, and esophagus demonstrate no significant findings. Lungs/Pleura: No pneumothorax or pleural effusion is noted. Stable right basilar scarring is noted. No acute consolidative process is noted. Upper Abdomen: Stable hepatic cyst. Stable right renal cyst. Stable nonobstructive left renal calculus. Musculoskeletal: No chest wall abnormality. No acute or significant osseous findings. Review of the MIP images confirms  the above findings. IMPRESSION: Stable 4.9 cm ascending thoracic aortic aneurysm. Ascending thoracic aortic aneurysm. Recommend semi-annual imaging followup by CTA or MRA and referral to cardiothoracic surgery if not already obtained. This recommendation follows 2010 ACCF/AHA/AATS/ACR/ASA/SCA/SCAI/SIR/STS/SVM Guidelines for the Diagnosis and Management of Patients With Thoracic Aortic Disease. Circulation. 2010; 121: L892-J194. Aortic Atherosclerosis (ICD10-I70.0). Electronically Signed   By: Marijo Conception, M.D.   On: 07/16/2017 12:16   Ct Angio Chest Aorta W &/or Wo Contrast  Result Date: 08/28/2016 CLINICAL DATA:  Thoracic ascending aortic aneurysm.  Follow-up. EXAM: CT ANGIOGRAPHY CHEST WITH CONTRAST TECHNIQUE: Multidetector CT imaging of the chest was performed using the standard protocol during bolus administration of intravenous contrast. Multiplanar CT image reconstructions and MIPs were obtained to evaluate the vascular anatomy. Creatinine was obtained on site at [facility] at [address] Results: Creatinine 1.0 mg/dL. CONTRAST:  75 mL Isovue 370 COMPARISON:  12/20/2015 FINDINGS: Cardiovascular: Ascending thoracic aorta near the sinotubular junction measures roughly 4.2 cm and unchanged. The mid ascending thoracic aorta measures up to 4.9 cm and stable. Proximal aortic arch measures 3.7 cm and stable. Distal aortic arch measures 3.2 cm and stable. The mid ascending thoracic aorta measures 2.9 cm and stable. 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 Overall, there is mild atherosclerotic disease throughout the thoracic aorta. There is also focal plaque at the 4 o'clock position of the aorta at the hiatus on image 104. Celiac trunk, proximal SMA and bilateral renal arteries are patent. There are coronary artery calcifications. Normal caliber of the main pulmonary arteries. Negative for thoracic aortic dissection.  Mediastinum/Nodes: No chest lymphadenopathy. Lungs/Pleura: Trachea and mainstem bronchi are patent. Stable scarring in the right lower lobe and along the right major fissure. Otherwise, the lungs are clear. No pleural effusions. Upper Abdomen: Stable 2.5 cm low-density structure in the central aspect of liver probably represents a cyst. There is an exophytic right renal cyst. Normal appearance of the adrenal glands. Small stone in the left kidney without hydronephrosis. Probable small left renal cysts. Musculoskeletal: No chest wall abnormality. No acute or significant osseous findings. Review of the MIP images confirms the above findings. IMPRESSION: Stable fusiform aneurysm of the ascending thoracic aorta measuring up to 4.9 cm. Recommend semi-annual imaging followup by CTA or MRA and referral to cardiothoracic surgery if not already obtained. This recommendation follows 2010 ACCF/AHA/AATS/ACR/ASA/SCA/SCAI/SIR/STS/SVM Guidelines for the Diagnosis and Management of Patients With Thoracic Aortic Disease. Circulation. 2010; 121: R740-C144 No acute chest abnormality. Electronically Signed  By: Markus Daft M.D.   On: 08/28/2016 14:05       Recent Lab Findings: Lab Results  Component Value Date   WBC 8.1 06/27/2015   HGB 13.2 06/27/2015   HCT 39.1 06/27/2015   PLT 182 06/27/2015   GLUCOSE 107 (H) 06/05/2017   CHOL 142 06/05/2017   TRIG 133 06/05/2017   HDL 42 06/05/2017   LDLCALC 73 06/05/2017   ALT 24 06/05/2017   AST 22 06/05/2017   NA 138 06/05/2017   K 4.1 06/05/2017   CL 98 06/05/2017   CREATININE 1.02 06/05/2017   BUN 15 06/05/2017   CO2 26 06/05/2017   INR 1.08 06/27/2015   Ct Angio Chest Aorta W/cm &/or Wo/cm  12/20/2015  CLINICAL DATA:  TAA. Occ chest pain. 10# wt loss. Nonsmoker. No prev surg or hx ca. Prev Ct 2002 purged, report available. EXAM: CT ANGIOGRAPHY CHEST WITH CONTRAST TECHNIQUE: Multidetector CT imaging of the chest was performed using the standard protocol during bolus  administration of intravenous contrast. Multiplanar CT image reconstructions and MIPs were obtained to evaluate the vascular anatomy. CONTRAST:  75 cc Isovue 370 COMPARISON:  Report of previous CT exam 07/06/2001, CT of the chest 06/04/2015 from [facility]: Heart: Coronary artery calcification is present. Trace pericardial effusion. Heart size is normal. Vascular structures: Ascending aortic aneurysm is 4.8 cm. No dissection. Moderate atherosclerotic calcification of the thoracic aorta. Moderate atherosclerotic calcification of the descending aorta, particularly at the level of the diaphragmatic hiatus. Mediastinum/thyroid: Left thyroid nodule measures 1.0 cm. No significant mediastinal, hilar, or axillary adenopathy. Lungs/Airways: No pulmonary nodules, pleural effusions, or infiltrates. Upper abdomen: Hypervascular 2.3 cm lesion identified at the dome of the right hepatic lobe, stable in appearance and consistent with benign hemangioma. A stable cyst is seen in the lateral segment of the left hepatic lobe measuring 2.4 cm. Findings are consistent with benign cyst. Small splenules are incidentally noted in the left upper quadrant the abdomen. Small hiatal hernia. Chest wall/osseous structures: Mild spondylosis of the thoracic spine. No suspicious lytic or blastic lesions are identified. Review of the MIP images confirms the above findings. IMPRESSION: 1. Ascending aortic aneurysm, 4.8 cm. Appearance is stable compared with previous CT of the chest from July 2016. Recommend semi-annual imaging followup by CTA or MRA and referral to cardiothoracic surgery if not already obtained. This recommendation follows 2010 ACCF/AHA/AATS/ACR/ASA/SCA/SCAI/SIR/STS/SVM Guidelines for the Diagnosis and Management of Patients With Thoracic Aortic Disease. Circulation. 2010; 121: D176-H607 2. Coronary artery disease. 3. Trace pericardial effusion. 4. Benign appearing left thyroid nodule. No further evaluation is felt  to be necessary based on consensus criteria. 5. Stable benign liver lesions. Electronically Signed   By: Nolon Nations M.D.   On: 12/20/2015 15:05    I have independently reviewed the above radiology studies  and reviewed the findings with the patient.     Stress test:done [facility]  CLINICAL DATA:  Chest pain  EXAM: MYOCARDIAL IMAGING WITH SPECT (REST AND PHARMACOLOGIC-STRESS)  GATED LEFT VENTRICULAR WALL MOTION STUDY  LEFT VENTRICULAR EJECTION FRACTION  TECHNIQUE: Standard myocardial SPECT imaging was performed after resting intravenous injection of 11.0 mCi Tc-62m sestamibi. Subsequently, intravenous infusion of Lexiscan was performed under the supervision of the Cardiology staff. At peak effect of the drug, 32.6 mCi Tc-55m sestamibi was injected intravenously and standard myocardial SPECT imaging was performed. Quantitative gated imaging was also performed to evaluate left ventricular wall motion, and estimate left ventricular ejection fraction.  COMPARISON:  None.  FINDINGS: Perfusion: Regional images of the left ventricular myocardium show no fixed defect to suggest infarct or reversible lesion to suggest ischemic change. No abnormality is identified on the SPECT study.  Wall Motion: Left ventricle is rather prominent. There is mild apical hypokinesis. No other wall motion abnormality is identified in the left ventricle.  Left Ventricular Ejection Fraction: 47 %  End diastolic volume 765 ml  End systolic volume 69 ml  IMPRESSION: 1. No reversible ischemia or infarction.  2. There is left ventricular prominence. There is mild apical hypokinesis. No other wall motion abnormalities are identified.  3. Left ventricular ejection fraction 47%  4. Intermediate risk stress test findings*.  *2012 Appropriate Use Criteria for Coronary Revascularization Focused Update: J Am Coll Cardiol.  4650;35(4):656-812. http://content.airportbarriers.com.aspx?articleid=1201161   Electronically Signed   By: Lowella Grip III M.D.   On: 06/06/2015 13:05  No significant chest pain symptoms reported  No significant arrhythmia noted  This is not a final report, NM images to follow  No significant ST segment changes or arrhythmias were noted during  stress  Uneventful Lexiscan injection.  Nuclear perfusion report to follow under separate cover  CT: CLINICAL DATA:  Epigastric pain. Shortness of breath, nausea. Back pain. History of hypertension, Raynaud's disease, hernia repair.  EXAM: CT ANGIOGRAPHY CHEST, ABDOMEN AND PELVIS  TECHNIQUE: Multidetector CT imaging through the chest, abdomen and pelvis was performed using the standard protocol during bolus administration of intravenous contrast. Multiplanar reconstructed images and MIPs were obtained and reviewed to evaluate the vascular anatomy.  CONTRAST:  100 cc Omnipaque 350  COMPARISON:  None.  FINDINGS: CTA CHEST FINDINGS  Mediastinum: Fusiform ascending aorta is enlarged, 4.8 cm with moderate calcific atherosclerosis of the aortic arch and descending aorta. Subcentimeter focal intimal hematoma descending aorta. No abnormal density within upper along the course of the aorta. Small amount of free fluid in the is sulcus, normal. No dissection, suspicious luminal irregularity/ulceration, contrast extravasation or periaortic hematoma. Heart size is mildly enlarged. Mild coronary artery calcifications. No pericardial fluid collection. No mediastinal lymphadenopathy.  Lungs: No pleural effusion or focal consolidation. Minimal pleural thickening/scarring of the RIGHT major fissure. Tracheobronchial tree is patent and midline, no pneumothorax.  Soft tissues and osseous structures: Mild degenerative change of the thoracic spine. No destructive bony lesions. Mild gynecomastia.  Review of the MIP images confirms  the above findings.  CTA ABDOMEN AND PELVIS FINDINGS  Retroperitoneum: Aortoiliac vessels are normal in course and caliber with moderate to severe calcific atherosclerosis. Celiac trunk origin is widely patent. Mild calcific atherosclerosis of the origin of the renal arteries, superior mesenteric artery. Inferior mesenteric artery is patent.  Organs: 2.5 cm flash filling hemangioma RIGHT lobe of the liver. 2.3 cm low-density cyst segment 4. No intrahepatic biliary dilatation. Spleen, gallbladder are normal. Fatty pancreas, otherwise unremarkable. Thickened adrenal glands can be seen with hyperplasia without discrete nodule.  GI tract: Small hiatal hernia. Stomach, small large bowel are normal in course and caliber without inflammatory changes though sensitivity may be decreased by lack of enteric contrast.  GU tract: Kidneys are orthotopic, normal size morphology. 4.7 cm exophytic LEFT lower pole cyst. 3 mm LEFT upper pole nonobstructing nephrolithiasis. 2.2 cm exophytic RIGHT interpolar cyst. Prostate is partially distended. Moderate prostatomegaly invading the base of the bladder.  Peritoneum: No intraperitoneal free fluid or free air. No lymphadenopathy by CT size criteria.  Soft tissues and osseous structures: Moderate to large LEFT, small RIGHT fat containing inguinal hernias. Small fat containing umbilical hernia. Grade  1 L5-S1 anterolisthesis, chronic bilateral L5 pars interarticularis defects, moderate to severe L5-S1 neural foraminal narrowing.  Review of the MIP images confirms the above findings.  IMPRESSION: CTA CHEST: Fusiform 4.8 cm ascending aortic aneurysm without dissection or acute vascular process. Recommend semi-annual imaging followup by CTA or MRA and referral to cardiothoracic surgery if not already obtained. This recommendation follows 2010 ACCF/AHA/AATS/ACR/ASA/SCA/SCAI/SIR/STS/SVM Guidelines for the Diagnosis and Management of Patients With  Thoracic Aortic Disease. Circulation. 2010; 121: X323-F573.  No acute cardiopulmonary process.  CTA ABDOMEN AND PELVIS: Atherosclerosis of the aortoiliac vessels without high-grade stenosis or acute vascular process.  No acute intra-abdominal or pelvic process. 3 mm nonobstructing LEFT upper pole nephrolithiasis.  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  Prostatomegaly.   Electronically Signed   By: Elon Alas M.D.   On: 06/05/2015 00:21  ECHO: Transthoracic Echocardiography Report (TTE)  Demographics  Patient Name      ZAHMIR LALLA     Gender                Male  Patient Number    [id number]   Race                  Caucasian  Visit Number      [id number]    Room Number           607  Accession Number  [id number]  Date of Study         06/05/2015  Date of Birth     December 07, 1940     Referring Physician   ED Fay Records, MD  Age               69 year(s)     Sonographer           Marikate Tennis,                                                         RDCS, BS                                   Interpreting          Rozann Lesches, MD                                   Physician Procedure Type of Study  TTE procedure: ECHOCARDIOGRAM FOLLOW UP/ LIMITED W DOPPLER. Procedure date Date: 06/05/2015 Start: 09:20 AM Technical Quality: Adequate visualization Study Location: Portable Indications: Chest pain. Patient Status: Routine Height: 68.11 inches Weight: 255.74 pounds BSA: 2.27 m2 BMI: 38.76 kg/m2 Rhythm: Normal sinus rhythm HR: 82 bpm BP: 116/69 mmHg Conclusions Summary Mild concentric left ventricular hypertrophy Normal left ventricular systolic function with no appreciable segmental abnormality. Mild AR. Signature ------------------------------------------------------------------------------  Electronically signed by Rozann Lesches, MD(Interpreting physician) on 06/05/2015  01:27 PM ------------------------------------------------------------------------------ Findings Mitral  Valve Structurally normal mitral valve with good mobility and no significant regurgitation by color flow doppler examination. Aortic Valve Structurally normal aortic valve with good leaflet mobility, and mild regurgitation by color flow doppler examination. Tricuspid Valve Tricuspid valve is structurally normal. Mild tricuspid regurgitation by color flow doppler examination. Pulmonic Valve Pulmonic valve is structurally normal. No Doppler evidence of  pulmonic stenosis or insufficiency. Left Atrium Normal size left atrium. Left Ventricle Mild concentric left ventricular hypertrophy Normal left ventricular systolic function with no appreciable segmental abnormality. Right Atrium Mild right atrial enlargement Right Ventricle Mildly dilated right ventricle. Pericardial Effusion No evidence of pericardial effusion. Miscellaneous Aortic root dimension within normal limits. IVC dilated. M-Mode/2D Measurements & Calculations  LV Diastolic Dimension:  LV Systolic Dimension:     LA Dimension: 3.42 cmAO  5.56 cm                  2.92 cm                    Root Dimension: 3.89 cm  LV FS:47.48 %            LV Volume Diastolic: 761  LV PW Diastolic: 1.4 cm  ml  Septum Diastolic: 6.07   LV Volume Systolic: 37.1  cm                       ml                           LV EDV/LV EDV Index: 127   LA/Aorta: 0.88                           ml/56 m2LV ESV/LV ESV                           Index: 50.5 ml/22 m2                           EF Calculated: 60.24 % Doppler Measurements & Calculations  MV Peak E-Wave: 72.7 cm/s AV Peak Velocity: 111  MV Peak A-Wave: 86.4 cm/s cm/s                     Estimated RVSP: 32.8 mmHg  MV E/A Ratio: 0.84        AV Peak Gradient: 4.93   Estimated RAP:10 mmHg  MV Peak Gradient: 2.11    mmHg  mmHg                                                     PV Peak Velocity: 102  TDI E' Velocity: 15 cm/s                           cm/s                                                      PV Peak Gradient: 4.16                                                     mmHg  E/E' prime 4.9 [address] [place], [place] [address] [phone]   Cardiac Cath: 2016:  dR kELLY  Mr. Pat is a 7 -year-old gentleman who has been documented to have a 4.8 cm ascending aortic aneurysm and is followed by Dr. Servando Snare. He was recently seen by Dr. Domenic Polite for Cardiologic evaluation. He has experience mild intermittent episodes of chest pain and shortness of breath. A nuclear study was interpreted as intermediate risk with an ejection fraction at 47% with apical hypokinesis without definitive ischemia. Definitive cardiac catheterization was recommended.   The patient was brought to the second floor [facility] cath lab in the postabsorptive state. The patient was premedicated with Versed 2 mg and fentanyl 50 mcg. A right radial approach was utilized after an Allen's test verified adequate circulation. The right radial artery was punctured via the Seldinger technique, and a 6 [place] Glidesheath Slender was inserted without difficulty. A radial cocktail consisting of Verapamil, IV nitroglycerin, and lidocaine was administered. Weight adjusted heparin was administered. A safety J wire was advanced into the ascending aorta. Diagnostic catheterization was done with a 5 [place] TIG 4.0 catheter. A 5 French pigtail catheter was used for left ventriculography. A TR radial band was applied for hemostasis. The patient left the catheterization laboratory in stable condition. There were no immediate complications during the procedure.      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  Right Coronary Artery  Mid RCA lesion, 35% stenosed.  Wall Motion              Left Heart   Left Ventricle The left ventricular size is normal. There is mild left ventricular systolic dysfunction. The left ventricular ejection fraction is 45-50% by  visual estimate. Mild LV dysfunction with an ejection fraction at 45-50% and a subtle area mild mid inferior hypocontractility.    Aorta The ascending aorta is dilated.    Coronary Diagrams   Diagnostic Diagram            Aortic Size Index=  4.8       /Body surface area is 2.34 meters squared. = 2.05    < 2.75 cm/m2      4% risk per year 2.75 to 4.25          8% risk per year > 4.25 cm/m2    20% risk per year    Assessment / Plan:   Stable 4.9 cm ascending thoracic aortic aneurysm. Ascending thoracic aortic aneurysm.I've reviewed the findings of the CT scan with the patient and his son in detail, with stability would recommend follow-up scan in 6-8 months, no operative intervention at this time     Patient was warned about not using Cipro and similar antibiotics. Recent studies have raised concern that fluoroquinolone antibiotics could be associated with an increased risk of aortic aneurysm Fluoroquinolones have non-antimicrobial properties that might jeopardise the integrity of the extracellular matrix of the vascular wall In a  propensity score matched cohort study in [place], there was a 66% increased rate of aortic aneurysm or dissection associated with oral fluoroquinolone use, compared with amoxicillin use, within a 60 day risk period from start of treatment     Grace Isaac MD      [name] [place] [address] Office [phone]   Beeper [phone]  07/16/2017 1:06 PM

## 2017-07-16 ENCOUNTER — Encounter: Payer: Self-pay | Admitting: Cardiothoracic Surgery

## 2017-07-16 ENCOUNTER — Ambulatory Visit
Admission: RE | Admit: 2017-07-16 | Discharge: 2017-07-16 | Disposition: A | Discharge status: Home / Self Care | Payer: Medicare HMO | Source: Ambulatory Visit | Attending: Cardiothoracic Surgery | Admitting: Cardiothoracic Surgery

## 2017-07-16 ENCOUNTER — Ambulatory Visit (INDEPENDENT_AMBULATORY_CARE_PROVIDER_SITE_OTHER): Payer: Medicare HMO | Admitting: Cardiothoracic Surgery

## 2017-07-16 VITALS — BP 117/72 | HR 80 | Resp 20 | Ht 68.0 in | Wt 251.0 lb

## 2017-07-16 DIAGNOSIS — I712 Thoracic aortic aneurysm, without rupture, unspecified: Secondary | ICD-10-CM

## 2017-07-16 DIAGNOSIS — I700 Atherosclerosis of aorta: Secondary | ICD-10-CM | POA: Diagnosis not present

## 2017-07-16 MED ORDER — IOPAMIDOL (ISOVUE-370) INJECTION 76%
75.0000 mL | Freq: Once | INTRAVENOUS | Status: AC | PRN
  Administered 2017-07-16: 75 mL via INTRAVENOUS

## 2017-07-16 NOTE — Patient Instructions (Signed)

## 2017-08-26 ENCOUNTER — Encounter: Payer: Self-pay | Admitting: Cardiology

## 2017-08-26 ENCOUNTER — Ambulatory Visit (INDEPENDENT_AMBULATORY_CARE_PROVIDER_SITE_OTHER): Payer: Medicare HMO | Admitting: Cardiology

## 2017-08-26 VITALS — BP 100/60 | HR 67 | Ht 68.0 in | Wt 274.0 lb

## 2017-08-26 DIAGNOSIS — I7121 Aneurysm of the ascending aorta, without rupture: Secondary | ICD-10-CM

## 2017-08-26 DIAGNOSIS — I2510 Atherosclerotic heart disease of native coronary artery without angina pectoris: Secondary | ICD-10-CM | POA: Diagnosis not present

## 2017-08-26 DIAGNOSIS — I10 Essential (primary) hypertension: Secondary | ICD-10-CM

## 2017-08-26 DIAGNOSIS — I712 Thoracic aortic aneurysm, without rupture: Secondary | ICD-10-CM

## 2017-08-26 NOTE — Patient Instructions (Signed)
Medication Instructions:  Your physician recommends that you continue on your current medications as directed. Please refer to the Current Medication list given to you today.  Labwork: none  Testing/Procedures: none  Follow-Up: Your physician wants you to follow-up in: [place]. Domenic Polite You will receive a reminder letter in the mail two months in advance. If you don't receive a letter, please call our office to schedule the follow-up appointment.  Any Other Special Instructions Will Be Listed Below (If Applicable).  If you need a refill on your cardiac medications before your next appointment, please call your pharmacy.

## 2017-08-26 NOTE — Progress Notes (Signed)
Cardiology Office Note  Date: 08/26/2017   ID: Kaamil, [place] 08-Dec-1940, MRN [id number]  PCP: Chevis Pretty, [place]  Primary Cardiologist: Rozann Lesches, MD   Chief Complaint  Patient presents with  . Coronary Artery Disease    History of Present Illness: Joseph [name] is a 76 y.o. male last seen in August 2017. He presents for a routine follow-up visit. Since last encounter he has not had any significant anginal chest pain and reports NYHA class II dyspnea. His wife passed away back in May 20, 2023 and he more recently lost a brother, still going through the grieving process. He lives in his own home, does have some family support from his daughters.  He continues to follow with Dr. Servando Snare, last seen in August. He was found to have a stable 4.9 cm ascending thoracic aortic aneurysm and will have follow-up scan in about 6-8 months.  I personally reviewed his ECG today which shows sinus rhythm with leftward axis and decreased R wave progression.  Blood pressure is well controlled today we reviewed his medications which include Lotensin, HCTZ, Procardia XL, and Lopressor. He follows with [facility] for primary care.  Past Medical History:  Diagnosis Date  . Anxiety   . Ascending aortic aneurysm ([facility])   . Essential hypertension   . GERD (gastroesophageal reflux disease)   . Gout   . History of kidney stones   . Insomnia     Past Surgical History:  Procedure Laterality Date  . CARDIAC CATHETERIZATION N/A 06/27/2015   Procedure: Left Heart Cath and Coronary Angiography;  Surgeon: Troy Sine, MD;  Location: [facility];  Service: Cardiovascular;  Laterality: N/A;  . HERNIA REPAIR    . KIDNEY STONE SURGERY      Current Outpatient Prescriptions  Medication Sig Dispense Refill  . allopurinol (ZYLOPRIM) 300 MG tablet Take 1 tablet (300 mg total) by mouth daily. 90 tablet 1  . atorvastatin (LIPITOR) 40 MG tablet Take 1 tablet (40 mg total) by mouth daily. 90 tablet 3  .  benazepril (LOTENSIN) 20 MG tablet Take 1 tablet (20 mg total) by mouth daily. 90 tablet 1  . clonazePAM (KLONOPIN) 0.5 MG tablet Take 1 tablet (0.5 mg total) by mouth 2 (two) times daily as needed. 60 tablet 2  . docusate sodium (COLACE) 100 MG capsule Take 100 mg by mouth daily as needed for mild constipation.    . fish oil-omega-3 fatty acids 1000 MG capsule Take 2 g by mouth daily.    . hydrochlorothiazide (HYDRODIURIL) 25 MG tablet Take 1 tablet (25 mg total) by mouth daily. 90 tablet 3  . metoprolol tartrate (LOPRESSOR) 25 MG tablet Take 0.5 tablets (12.5 mg total) by mouth 2 (two) times daily. 90 tablet 3  . Multiple Vitamins-Minerals (COMPLETE MULTIVITAMIN/MINERAL PO) Take 1 tablet by mouth daily.    Marland Kitchen NIFEdipine (PROCARDIA-XL/ADALAT-CC/NIFEDICAL-XL) 30 MG 24 hr tablet Take 1 tablet (30 mg total) by mouth daily. 90 tablet 1  . Potassium 99 MG TABS Take 1 tablet (99 mg total) by mouth daily. 330 each 1  . traMADol (ULTRAM) 50 MG tablet Take 1 tablet (50 mg total) by mouth 2 (two) times daily. 60 tablet 0  . zolpidem (AMBIEN) 10 MG tablet Take 1 tablet (10 mg total) by mouth at bedtime as needed. for sleep 30 tablet 2   No current facility-administered medications for this visit.    Allergies:  Carafate [sucralfate]; Ciprofloxacin hcl; Prevacid [lansoprazole]; and Sulfa antibiotics   Social  History: The patient  reports that he quit smoking about 59 years ago. His smoking use included Cigarettes. He has a 0.50 pack-year smoking history. He has quit using smokeless tobacco. His smokeless tobacco use included Chew. He reports that he does not drink alcohol or use drugs.   ROS:  Please see the history of present illness. Otherwise, complete review of systems is positive for arthritic leg pains.  All other systems are reviewed and negative.   Physical Exam: VS:  BP 100/60   Pulse 67   Ht 5\' 8"  (1.727 m)   Wt 274 lb (124.3 kg)   SpO2 97%   BMI 41.66 kg/m , BMI Body mass index is 41.66  kg/m.  Wt Readings from Last 3 Encounters:  08/26/17 274 lb (124.3 kg)  07/16/17 251 lb (113.9 kg)  06/05/17 245 lb (111.1 kg)    General: Obese male, appears comfortable at rest. Using a cane. HEENT: Conjunctiva and lids normal, oropharynx clear. Neck: Supple, no elevated JVP or carotid bruits, no thyromegaly. Lungs: Clear to auscultation, nonlabored breathing at rest. Cardiac: Regular rate and rhythm, no S3 or significant systolic murmur, no pericardial rub. Abdomen: Obese, nontender, bowel sounds present. Extremities: Mild lower leg edema, distal pulses 2+. Skin: Warm and dry. Musculoskeletal: No kyphosis. Neuropsychiatric: Alert and oriented x3, affect grossly appropriate.  ECG: I personally reviewed the tracing from 07/29/2016 which showed sinus rhythm with low voltage, decreased R wave progression, nonspecific T-wave changes.  Recent Labwork: 06/05/2017: ALT 24; AST 22; BUN 15; Creatinine, Ser 1.02; Potassium 4.1; Sodium 138     Component Value Date/Time   CHOL 142 06/05/2017 1019   CHOL 208 (H) 03/22/2013 0928   TRIG 133 06/05/2017 1019   TRIG 142 04/18/2015 0942   TRIG 163 (H) 03/22/2013 0928   HDL 42 06/05/2017 1019   HDL 43 04/18/2015 0942   HDL 39 (L) 03/22/2013 0928   CHOLHDL 3.4 06/05/2017 1019   LDLCALC 73 06/05/2017 1019   LDLCALC 65 07/10/2014 0936   LDLCALC 136 (H) 03/22/2013 0928    Other Studies Reviewed Today:  Cardiac catheterization 06/27/2015:  Mid LAD lesion, 25% stenosed.  Mid RCA lesion, 35% stenosed.  There is mild left ventricular systolic dysfunction.   Mild LV dysfunction with an ejection fraction of 45-50% with a subtle region of mild mid inferior hypocontractility.  Mild nonobstructive 2 vessel CAD with 25% smooth stenosis in the LAD immediately after the takeoff of the first diagonal vessel; normal left circumflex artery; dominant right coronary artery with smooth 30-40% mid stenosis.  Chest CTA 07/16/2017: IMPRESSION: Stable 4.9 cm  ascending thoracic aortic aneurysm. Ascending thoracic aortic aneurysm. Recommend semi-annual imaging followup by CTA or MRA and referral to cardiothoracic surgery if not already obtained. This recommendation follows 2010 ACCF/AHA/AATS/ACR/ASA/SCA/SCAI/SIR/STS/SVM Guidelines for the Diagnosis and Management of Patients With Thoracic Aortic Disease. Circulation. 2010; 121: Z610-R604.  Assessment and Plan:  1. Asymptomatic, stable 4.9 cm ascending thoracic aortic aneurysm, followed by Dr. Servando Snare.  2. Mild coronary atherosclerosis documented at cardiac catheterization in 2016. He has no angina symptoms on medical therapy. He remains on antihypertensive regimen and statin.  3. Essential hypertension, blood pressure is well controlled today.  4. Morbid obesity, weight loss recommended.  Current medicines were reviewed with the patient today.   Orders Placed This Encounter  Procedures  . EKG 12-Lead    Disposition: Follow-up in one year, sooner if needed.  Signed, Satira Sark, MD, [facility] 08/26/2017 12:06 PM    [facility] [place], [place], [place] [address] Phone: [phone]; Fax: [phone]

## 2017-09-05 ENCOUNTER — Other Ambulatory Visit: Payer: Self-pay | Admitting: Nurse Practitioner

## 2017-09-05 DIAGNOSIS — F411 Generalized anxiety disorder: Secondary | ICD-10-CM

## 2017-09-05 DIAGNOSIS — I7300 Raynaud's syndrome without gangrene: Secondary | ICD-10-CM

## 2017-09-07 ENCOUNTER — Other Ambulatory Visit: Payer: Self-pay | Admitting: Nurse Practitioner

## 2017-09-07 DIAGNOSIS — I7300 Raynaud's syndrome without gangrene: Secondary | ICD-10-CM

## 2017-09-07 DIAGNOSIS — F411 Generalized anxiety disorder: Secondary | ICD-10-CM

## 2017-09-07 NOTE — Telephone Encounter (Signed)
Please call in clonazepam and tramadol with 0 refills each

## 2017-09-07 NOTE — Telephone Encounter (Signed)
Called to [facility] 

## 2017-09-11 ENCOUNTER — Ambulatory Visit (INDEPENDENT_AMBULATORY_CARE_PROVIDER_SITE_OTHER): Payer: Medicare HMO | Admitting: Nurse Practitioner

## 2017-09-11 ENCOUNTER — Encounter: Payer: Self-pay | Admitting: Nurse Practitioner

## 2017-09-11 VITALS — BP 118/68 | HR 81 | Temp 96.9°F | Ht 68.0 in | Wt 252.0 lb

## 2017-09-11 DIAGNOSIS — I7300 Raynaud's syndrome without gangrene: Secondary | ICD-10-CM

## 2017-09-11 DIAGNOSIS — F5101 Primary insomnia: Secondary | ICD-10-CM

## 2017-09-11 DIAGNOSIS — Z23 Encounter for immunization: Secondary | ICD-10-CM | POA: Diagnosis not present

## 2017-09-11 DIAGNOSIS — E785 Hyperlipidemia, unspecified: Secondary | ICD-10-CM | POA: Diagnosis not present

## 2017-09-11 DIAGNOSIS — I712 Thoracic aortic aneurysm, without rupture: Secondary | ICD-10-CM | POA: Diagnosis not present

## 2017-09-11 DIAGNOSIS — I10 Essential (primary) hypertension: Secondary | ICD-10-CM | POA: Diagnosis not present

## 2017-09-11 DIAGNOSIS — F411 Generalized anxiety disorder: Secondary | ICD-10-CM

## 2017-09-11 DIAGNOSIS — N400 Enlarged prostate without lower urinary tract symptoms: Secondary | ICD-10-CM

## 2017-09-11 DIAGNOSIS — M1A09X0 Idiopathic chronic gout, multiple sites, without tophus (tophi): Secondary | ICD-10-CM | POA: Diagnosis not present

## 2017-09-11 DIAGNOSIS — I7121 Aneurysm of the ascending aorta, without rupture: Secondary | ICD-10-CM

## 2017-09-11 DIAGNOSIS — R739 Hyperglycemia, unspecified: Secondary | ICD-10-CM

## 2017-09-11 MED ORDER — CLONAZEPAM 0.5 MG PO TABS: 0.5000 mg | ORAL_TABLET | Freq: Two times a day (BID) | ORAL | 1 refills | Status: DC | PRN

## 2017-09-11 MED ORDER — ZOLPIDEM TARTRATE 10 MG PO TABS: 10.0000 mg | ORAL_TABLET | Freq: Every evening | ORAL | 2 refills | Status: DC | PRN

## 2017-09-11 NOTE — Progress Notes (Signed)
Subjective:    Patient ID: Joseph [name], male    DOB: 1941/08/07, 76 y.o.   MRN: [id number]  HPI   Joseph [name] is here today for follow up of chronic medical problem.  Outpatient Encounter Prescriptions as of 09/11/2017  Medication Sig  . allopurinol (ZYLOPRIM) 300 MG tablet Take 1 tablet (300 mg total) by mouth daily.  Joseph [name] atorvastatin (LIPITOR) 40 MG tablet Take 1 tablet (40 mg total) by mouth daily.  . benazepril (LOTENSIN) 20 MG tablet Take 1 tablet (20 mg total) by mouth daily.  . clonazePAM (KLONOPIN) 0.5 MG tablet TAKE 1 TABLET BY MOUTH TWICE DAILY AS NEEDED  . docusate sodium (COLACE) 100 MG capsule Take 100 mg by mouth daily as needed for mild constipation.  . fish oil-omega-3 fatty acids 1000 MG capsule Take 2 g by mouth daily.  . hydrochlorothiazide (HYDRODIURIL) 25 MG tablet Take 1 tablet (25 mg total) by mouth daily.  . metoprolol tartrate (LOPRESSOR) 25 MG tablet Take 0.5 tablets (12.5 mg total) by mouth 2 (two) times daily.  . Multiple Vitamins-Minerals (COMPLETE MULTIVITAMIN/MINERAL PO) Take 1 tablet by mouth daily.  Joseph [name] NIFEdipine (PROCARDIA-XL/ADALAT-CC/NIFEDICAL-XL) 30 MG 24 hr tablet Take 1 tablet (30 mg total) by mouth daily.  . Potassium 99 MG TABS Take 1 tablet (99 mg total) by mouth daily.  . traMADol (ULTRAM) 50 MG tablet TAKE 1 TABLET BY MOUTH TWICE DAILY  . zolpidem (AMBIEN) 10 MG tablet Take 1 tablet (10 mg total) by mouth at bedtime as needed. for sleep   No facility-administered encounter medications on file as of 09/11/2017.     1. Thoracic ascending aortic aneurysm (HCC)  Saw Dr. Domenic Polite on 08/26/17 and Dr. Servando Snare 07/16/17. Stable and is to follow up with both in 1 year  2. Raynaud's disease without gangrene  No problems- says that hands just stay cold all the tome  3. Essential hypertension, benign  No c/o chest pain,SOB or headache. Does not check blood pressures at home, BP Readings from Last 3 Encounters:  09/11/17 118/68  08/26/17 100/60    07/16/17 117/72     4. Benign prostatic hyperplasia without lower urinary tract symptoms  Has no seen urologist ina while- denies any problems passing his water. Last PSA 01/2017 3.0  5. Severe obesity (BMI >= 40) (HCC)  Weigh increasing since wife died  21. Primary insomnia  Cannot sleep without taking abiem  7. Hyperlipidemia with target LDL less than 100  Not watching diet  8. Idiopathic chronic gout of multiple sites without tophus  No recent flare up.    New complaints: None today  Social history: Lives alone- wife died about 3 months ago- he is doing much better and was able to go camping with some friends for 4 days.   Review of Systems  Constitutional: Negative for activity change and appetite change.  HENT: Negative.   Eyes: Negative for pain.  Respiratory: Negative for shortness of breath.   Cardiovascular: Negative for chest pain, palpitations and leg swelling.  Gastrointestinal: Negative for abdominal pain.  Endocrine: Negative for polydipsia.  Genitourinary: Negative.   Skin: Negative for rash.  Neurological: Negative for dizziness, weakness and headaches.  Hematological: Does not bruise/bleed easily.  Psychiatric/Behavioral: Negative.   All other systems reviewed and are negative.      Objective:   Physical Exam  Constitutional: He is oriented to person, place, and time. He appears well-developed and well-nourished.  HENT:  Head: Normocephalic.  Right Ear: External ear normal.  Left Ear: External ear normal.  Nose: Nose normal.  Mouth/Throat: Oropharynx is clear and moist.  Eyes: Pupils are equal, round, and reactive to light. EOM are normal.  Neck: Normal range of motion. Neck supple. No JVD present. No thyromegaly present.  Cardiovascular: Normal rate, regular rhythm, normal heart sounds and intact distal pulses.  Exam reveals no gallop and no friction rub.   No murmur heard. Pulmonary/Chest: Effort normal and breath sounds normal. No respiratory  distress. He has no wheezes. He has no rales. He exhibits no tenderness.  Abdominal: Soft. Bowel sounds are normal. He exhibits no mass. There is no tenderness.  Genitourinary: Prostate normal and penis normal.  Musculoskeletal: Normal range of motion. He exhibits no edema.  Lymphadenopathy:    He has no cervical adenopathy.  Neurological: He is alert and oriented to person, place, and time. No cranial nerve deficit.  Skin: Skin is warm and dry.  Psychiatric: He has a normal mood and affect. His behavior is normal. Judgment and thought content normal.   BP 118/68   Pulse 81   Temp (!) 96.9 F (36.1 C) (Oral)   Ht _0  (1.727 m)   Wt 252 lb (114.3 kg)   BMI 38.32 kg/m         Assessment & Plan:  1. Thoracic ascending aortic aneurysm [facility]) Keep follow up with cardiothoracic   2. Raynaud's disease without gangrene Keep hands warm  3. Essential hypertension, benign Low sodium diet - CMP14+EGFR  4. Benign prostatic hyperplasia without lower urinary tract symptoms  5. Severe obesity (BMI >= 40) (HCC) Discussed diet and exercise for person with BMI >25 Will recheck weight in 3-6 months  6. Primary insomnia Bedtime routine - zolpidem (AMBIEN) 10 MG tablet; Take 1 tablet (10 mg total) by mouth at bedtime as needed. for sleep  Dispense: 30 tablet; Refill: 2  7. Hyperlipidemia with target LDL less than 100 Low fat diet encouraged Is only taking cholesterol meds every other day because it helps with ain in hands and feet - Lipid panel  8. Idiopathic chronic gout of multiple sites without tophus  9. GAD (generalized anxiety disorder) Would like to see him wean off klonopin if possible - clonazePAM (KLONOPIN) 0.5 MG tablet; Take 1 tablet (0.5 mg total) by mouth 2 (two) times daily as needed.  Dispense: 60 tablet; Refill: 1    Labs pending Health maintenance reviewed Diet and exercise encouraged Continue all meds Follow up  In 3 months   [place],  FNP

## 2017-09-11 NOTE — Patient Instructions (Signed)

## 2017-09-13 LAB — CMP14+EGFR
ALK PHOS: 68 IU/L (ref 39–117)
ALT: 21 IU/L (ref 0–44)
AST: 24 IU/L (ref 0–40)
Albumin/Globulin Ratio: 1.8 (ref 1.2–2.2)
Albumin: 4.3 g/dL (ref 3.5–4.8)
BILIRUBIN TOTAL: 0.5 mg/dL (ref 0.0–1.2)
BUN/Creatinine Ratio: 9 — ABNORMAL LOW (ref 10–24)
BUN: 9 mg/dL (ref 8–27)
CHLORIDE: 94 mmol/L — AB (ref 96–106)
CO2: 22 mmol/L (ref 20–29)
CREATININE: 0.96 mg/dL (ref 0.76–1.27)
Calcium: 9.3 mg/dL (ref 8.6–10.2)
GFR calc Af Amer: 90 mL/min/{1.73_m2} (ref >59)
GFR calc non Af Amer: 78 mL/min/{1.73_m2} (ref >59)
GLUCOSE: 140 mg/dL — AB (ref 65–99)
Globulin, Total: 2.4 g/dL (ref 1.5–4.5)
Potassium: 3.5 mmol/L (ref 3.5–5.2)
Sodium: 133 mmol/L — ABNORMAL LOW (ref 134–144)
TOTAL PROTEIN: 6.7 g/dL (ref 6.0–8.5)

## 2017-09-13 LAB — LIPID PANEL
CHOLESTEROL TOTAL: 148 mg/dL (ref 100–199)
Chol/HDL Ratio: 3.7 ratio (ref 0.0–5.0)
HDL: 40 mg/dL (ref >39)
LDL CALC: 75 mg/dL (ref 0–99)
Triglycerides: 165 mg/dL — ABNORMAL HIGH (ref 0–149)
VLDL CHOLESTEROL CAL: 33 mg/dL (ref 5–40)

## 2017-09-17 ENCOUNTER — Other Ambulatory Visit: Payer: Self-pay | Admitting: Nurse Practitioner

## 2017-09-17 DIAGNOSIS — R739 Hyperglycemia, unspecified: Secondary | ICD-10-CM

## 2017-09-18 DIAGNOSIS — R739 Hyperglycemia, unspecified: Secondary | ICD-10-CM | POA: Diagnosis not present

## 2017-09-18 LAB — BAYER DCA HB A1C WAIVED: HB A1C (BAYER DCA - WAIVED): 5.5 % (ref <7.0)

## 2017-09-18 NOTE — Addendum Note (Signed)
Addended by: Earlene Plater on: 09/18/2017 11:38 AM   Modules accepted: Orders

## 2017-10-05 ENCOUNTER — Encounter: Payer: Self-pay | Admitting: Family Medicine

## 2017-10-05 ENCOUNTER — Ambulatory Visit (INDEPENDENT_AMBULATORY_CARE_PROVIDER_SITE_OTHER): Payer: Medicare HMO | Admitting: Family Medicine

## 2017-10-05 VITALS — BP 136/86 | HR 74 | Temp 97.6°F | Ht 68.0 in | Wt 251.0 lb

## 2017-10-05 DIAGNOSIS — H8111 Benign paroxysmal vertigo, right ear: Secondary | ICD-10-CM

## 2017-10-05 MED ORDER — MECLIZINE HCL 25 MG PO TABS: 12.5000 mg | ORAL_TABLET | Freq: Three times a day (TID) | ORAL | 0 refills | Status: DC | PRN

## 2017-10-05 NOTE — Progress Notes (Signed)
Subjective: CC: Dizziness PCP: Chevis Pretty, FNP [facility] Joseph [name] is a 76 y.o. male, who is accompanied by his sister-in-law today's visit. He is presenting to clinic today for:  1.  Dizziness Patient reports acute onset of dizziness, which she describes as room spinning, about 3 days ago.  He notes that it is slightly better than onset but is still persistent.  He reports increased symptoms with turning head to right specifically.  Reports intermittent nausea but no vomiting.  No loss of consciousness, weakness, falls, visual disturbance, slurred speech, numbness or tingling.  He is taken over-the-counter Bonine with slight improvement in symptoms.  He notes that he had a similar episode about 6 months ago which the medication did relieve.  He notes that his friend told him that meclizine might help and he is wondering if he can get this today.  Allergies  Allergen Reactions  . Carafate [Sucralfate] Other (See Comments)    TOOK SKIN FROM HIS MOUTH  . Ciprofloxacin Hcl Other (See Comments)    CAN'T REMEMBER  . Prevacid [Lansoprazole] Other (See Comments)    MADE HIM FEEL BAD  . Sulfa Antibiotics Diarrhea    SEVERE   Past Medical History:  Diagnosis Date  . Anxiety   . Ascending aortic aneurysm ([place])   . Essential hypertension   . GERD (gastroesophageal reflux disease)   . Gout   . History of kidney stones   . Insomnia    Family History  Problem Relation Age of Onset  . Heart disease Father   . Diabetes Sister   . Heart disease Brother   . Deep vein thrombosis Brother   . Congestive Heart Failure Brother     Current Outpatient Medications:  .  allopurinol (ZYLOPRIM) 300 MG tablet, Take 1 tablet (300 mg total) by mouth daily., Disp: 90 tablet, Rfl: 1 .  benazepril (LOTENSIN) 20 MG tablet, Take 1 tablet (20 mg total) by mouth daily., Disp: 90 tablet, Rfl: 1 .  clonazePAM (KLONOPIN) 0.5 MG tablet, Take 1 tablet (0.5 mg total) by mouth 2 (two) times daily as needed.,  Disp: 60 tablet, Rfl: 1 .  docusate sodium (COLACE) 100 MG capsule, Take 100 mg by mouth daily as needed for mild constipation., Disp: , Rfl:  .  fish oil-omega-3 fatty acids 1000 MG capsule, Take 2 g by mouth daily., Disp: , Rfl:  .  hydrochlorothiazide (HYDRODIURIL) 25 MG tablet, Take 1 tablet (25 mg total) by mouth daily., Disp: 90 tablet, Rfl: 3 .  metoprolol tartrate (LOPRESSOR) 25 MG tablet, Take 0.5 tablets (12.5 mg total) by mouth 2 (two) times daily., Disp: 90 tablet, Rfl: 3 .  Multiple Vitamins-Minerals (COMPLETE MULTIVITAMIN/MINERAL PO), Take 1 tablet by mouth daily., Disp: , Rfl:  .  NIFEdipine (PROCARDIA-XL/ADALAT-CC/NIFEDICAL-XL) 30 MG 24 hr tablet, Take 1 tablet (30 mg total) by mouth daily., Disp: 90 tablet, Rfl: 1 .  Potassium 99 MG TABS, Take 1 tablet (99 mg total) by mouth daily., Disp: 330 each, Rfl: 1 .  zolpidem (AMBIEN) 10 MG tablet, Take 1 tablet (10 mg total) by mouth at bedtime as needed. for sleep, Disp: 30 tablet, Rfl: 2 .  meclizine (ANTIVERT) 25 MG tablet, Take 0.5-1 tablets (12.5-25 mg total) 3 (three) times daily as needed by mouth for dizziness., Disp: 30 tablet, Rfl: 0 .  traMADol (ULTRAM) 50 MG tablet, TAKE 1 TABLET BY MOUTH TWICE DAILY (Patient not taking: Reported on 10/05/2017), Disp: 60 tablet, Rfl: 0  Social Hx: former smoker.  ROS:  Per HPI  Objective: Office vital signs reviewed. BP 136/86   Pulse 74   Temp 97.6 Joseph (36.4 C) (Oral)   Ht 5\' 8"  (1.727 m)   Wt 251 lb (113.9 kg)   BMI 38.16 kg/m   Physical Examination:  General: Awake, alert, overweight, No acute distress HEENT: Normal    Neck: No masses palpated. No lymphadenopathy    Ears: Tympanic membranes intact, normal light reflex, no erythema, no bulging    Eyes: PERRLA, extraocular membranes intact, sclera white    Nose: nasal turbinates moist, no nasal discharge    Throat: moist mucus membranes, no erythema, no tonsillar exudate.  Airway is patent Cardio: regular rate and rhythm, S1S2  heard, no murmurs appreciated Pulm: clear to auscultation bilaterally, no wheezes, rhonchi or rales; normal work of breathing on room air MSK: Uses cane for ambulation Skin: dry; intact; no rashes or lesions Neuro: Cranial nerves II through XII grossly intact.  Follows commands; no nystagmus on exam.  Negative Romberg.  Orthostatic VS for the past 24 hrs:  BP- Lying Pulse- Lying BP- Sitting Pulse- Sitting BP- Standing at 0 minutes Pulse- Standing at 0 minutes  10/05/17 0909 136/82 72 136/86 74 134/80 88    Assessment/ Plan: 76 y.o. male   1. Benign paroxysmal positional vertigo of right ear Orthostatic vital signs unremarkable.  No neurologic deficits on today's exam.  Clinically consistent with BPPV.  Epley maneuver handout was provided to patient today.  Referral to vestibular rehab was also placed today.  I did discuss with him that meclizine is unlikely to help with symptoms.  However, I provided him a trial of meclizine and instructed him that if he found no improvement symptoms after second dose to completely discontinue medication.  I advised him to avoid other OTC dizziness medications.  Encourage p.o. hydration.  Change positions slowly.Strict return precautions and reasons for emergent evaluation in the emergency department review with patient.  They voiced understanding and will follow-up as needed. - Ambulatory referral to Physical Therapy   Orders Placed This Encounter  Procedures  . Ambulatory referral to Physical Therapy    Referral Priority:   Urgent    Referral Type:   Physical Medicine    Referral Reason:   Specialty Services Required    Requested Specialty:   Physical Therapy    Number of Visits Requested:   1   Meds ordered this encounter  Medications  . meclizine (ANTIVERT) 25 MG tablet    Sig: Take 0.5-1 tablets (12.5-25 mg total) 3 (three) times daily as needed by mouth for dizziness.    Dispense:  30 tablet    Refill:  [place], DO [place] [phone]

## 2017-10-05 NOTE — Patient Instructions (Signed)
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  I have given you a handout on how to treat this at home.  I have also prescribed you a short course of Meclizine.  If you notice no improvement in your symptoms by the second dose, please discontinue using medication.  Do not use other over the counter dizziness medication while taking this one.  I have placed a referral to the Physical therapist in [place] to reset your ears.    Benign Positional Vertigo Vertigo is the feeling that you or your surroundings are moving when they are not. Benign positional vertigo is the most common form of vertigo. The cause of this condition is not serious (is benign). This condition is triggered by certain movements and positions (is positional). This condition can be dangerous if it occurs while you are doing something that could endanger you or others, such as driving. What are the causes? In many cases, the cause of this condition is not known. It may be caused by a disturbance in an area of the inner ear that helps your brain to sense movement and balance. This disturbance can be caused by a viral infection (labyrinthitis), head injury, or repetitive motion. What increases the risk? This condition is more likely to develop in:  Women.  People who are 23 years of age or older.  What are the signs or symptoms? Symptoms of this condition usually happen when you move your head or your eyes in different directions. Symptoms may start suddenly, and they usually last for less than a minute. Symptoms may include:  Loss of balance and falling.  Feeling like you are spinning or moving.  Feeling like your surroundings are spinning or moving.  Nausea and vomiting.  Blurred vision.  Dizziness.  Involuntary eye movement (nystagmus).  Symptoms can be mild and cause only slight annoyance, or they can be severe and interfere with daily life. Episodes of benign positional vertigo may return (recur) over  time, and they may be triggered by certain movements. Symptoms may improve over time. How is this diagnosed? This condition is usually diagnosed by medical history and a physical exam of the head, neck, and ears. You may be referred to a health care provider who specializes in ear, nose, and throat (ENT) problems (otolaryngologist) or a provider who specializes in disorders of the nervous system (neurologist). You may have additional testing, including:  MRI.  A CT scan.  Eye movement tests. Your health care provider may ask you to change positions quickly while he or she watches you for symptoms of benign positional vertigo, such as nystagmus. Eye movement may be tested with an electronystagmogram (ENG), caloric stimulation, the Dix-Hallpike test, or the roll test.  An electroencephalogram (EEG). This records electrical activity in your brain.  Hearing tests.  How is this treated? Usually, your health care provider will treat this by moving your head in specific positions to adjust your inner ear back to normal. Surgery may be needed in severe cases, but this is rare. In some cases, benign positional vertigo may resolve on its own in 2-4 weeks. Follow these instructions at home: Safety  Move slowly.Avoid sudden body or head movements.  Avoid driving.  Avoid operating heavy machinery.  Avoid doing any tasks that would be dangerous to you or others if a vertigo episode would occur.  If you have trouble walking or keeping your balance, try using a cane for stability. If you feel dizzy or unstable, sit down  right away.  Return to your normal activities as told by your health care provider. Ask your health care provider what activities are safe for you. General instructions  Take over-the-counter and prescription medicines only as told by your health care provider.  Avoid certain positions or movements as told by your health care provider.  Drink enough fluid to keep your urine clear  or pale yellow.  Keep all follow-up visits as told by your health care provider. This is important. Contact a health care provider if:  You have a fever.  Your condition gets worse or you develop new symptoms.  Your family or friends notice any behavioral changes.  Your nausea or vomiting gets worse.  You have numbness or a "pins and needles" sensation. Get help right away if:  You have difficulty speaking or moving.  You are always dizzy.  You faint.  You develop severe headaches.  You have weakness in your legs or arms.  You have changes in your hearing or vision.  You develop a stiff neck.  You develop sensitivity to light. This information is not intended to replace advice given to you by your health care provider. Make sure you discuss any questions you have with your health care provider. Document Released: 08/25/2006 Document Revised: 04/24/2016 Document Reviewed: 03/12/2015 Elsevier Interactive Patient Education  [employer].

## 2017-11-02 ENCOUNTER — Other Ambulatory Visit: Payer: Self-pay | Admitting: Nurse Practitioner

## 2017-11-02 DIAGNOSIS — I7300 Raynaud's syndrome without gangrene: Secondary | ICD-10-CM

## 2017-11-04 ENCOUNTER — Telehealth: Payer: Self-pay | Admitting: Nurse Practitioner

## 2017-11-04 NOTE — Telephone Encounter (Signed)
What is the name of the medication? TRAMADOL  Have you contacted your pharmacy to request a refill? yes  Which pharmacy would you like this sent to? [facility]   Patient notified that their request is being sent to the clinical staff for review and that they should receive a call once it is complete. If they do not receive a call within 24 hours they can check with their pharmacy or our office.

## 2017-11-04 NOTE — Telephone Encounter (Signed)
ntbs for pain meds 

## 2017-11-04 NOTE — Telephone Encounter (Signed)
Attempted to contact patient - NA °

## 2017-11-04 NOTE — Telephone Encounter (Signed)
Please address

## 2017-11-05 ENCOUNTER — Other Ambulatory Visit: Payer: Self-pay | Admitting: Nurse Practitioner

## 2017-11-05 DIAGNOSIS — I10 Essential (primary) hypertension: Secondary | ICD-10-CM

## 2017-11-26 NOTE — Telephone Encounter (Signed)
Attempted to contact patient- no call back. This encounter will be closed. 

## 2017-12-04 ENCOUNTER — Other Ambulatory Visit: Payer: Self-pay | Admitting: Nurse Practitioner

## 2017-12-04 DIAGNOSIS — F411 Generalized anxiety disorder: Secondary | ICD-10-CM

## 2017-12-04 NOTE — Telephone Encounter (Signed)
Patient last seen in office on 10/05/17. Rx last prescribed on 10/12 for #60 with 1 RF. Please advise

## 2017-12-15 ENCOUNTER — Encounter: Payer: Self-pay | Admitting: Nurse Practitioner

## 2017-12-15 ENCOUNTER — Ambulatory Visit (INDEPENDENT_AMBULATORY_CARE_PROVIDER_SITE_OTHER): Payer: Medicare HMO | Admitting: Nurse Practitioner

## 2017-12-15 VITALS — BP 114/76 | HR 76 | Temp 96.6°F | Ht 68.0 in | Wt 255.0 lb

## 2017-12-15 DIAGNOSIS — M1A09X0 Idiopathic chronic gout, multiple sites, without tophus (tophi): Secondary | ICD-10-CM | POA: Diagnosis not present

## 2017-12-15 DIAGNOSIS — N400 Enlarged prostate without lower urinary tract symptoms: Secondary | ICD-10-CM | POA: Diagnosis not present

## 2017-12-15 DIAGNOSIS — I712 Thoracic aortic aneurysm, without rupture: Secondary | ICD-10-CM

## 2017-12-15 DIAGNOSIS — I7121 Aneurysm of the ascending aorta, without rupture: Secondary | ICD-10-CM

## 2017-12-15 DIAGNOSIS — I7300 Raynaud's syndrome without gangrene: Secondary | ICD-10-CM

## 2017-12-15 DIAGNOSIS — F5101 Primary insomnia: Secondary | ICD-10-CM | POA: Diagnosis not present

## 2017-12-15 DIAGNOSIS — E876 Hypokalemia: Secondary | ICD-10-CM | POA: Diagnosis not present

## 2017-12-15 DIAGNOSIS — E785 Hyperlipidemia, unspecified: Secondary | ICD-10-CM | POA: Diagnosis not present

## 2017-12-15 DIAGNOSIS — I10 Essential (primary) hypertension: Secondary | ICD-10-CM | POA: Diagnosis not present

## 2017-12-15 DIAGNOSIS — F411 Generalized anxiety disorder: Secondary | ICD-10-CM | POA: Diagnosis not present

## 2017-12-15 MED ORDER — MECLIZINE HCL 25 MG PO TABS: 12.5000 mg | ORAL_TABLET | Freq: Three times a day (TID) | ORAL | 1 refills | Status: DC | PRN

## 2017-12-15 MED ORDER — BENAZEPRIL HCL 20 MG PO TABS: 20.0000 mg | ORAL_TABLET | Freq: Every day | ORAL | 1 refills | Status: DC

## 2017-12-15 MED ORDER — ZOLPIDEM TARTRATE 10 MG PO TABS: 10.0000 mg | ORAL_TABLET | Freq: Every evening | ORAL | 2 refills | Status: DC | PRN

## 2017-12-15 MED ORDER — NIFEDIPINE ER OSMOTIC RELEASE 30 MG PO TB24: 30.0000 mg | ORAL_TABLET | Freq: Every day | ORAL | 1 refills | Status: DC

## 2017-12-15 MED ORDER — CLONAZEPAM 0.5 MG PO TABS: 0.5000 mg | ORAL_TABLET | Freq: Two times a day (BID) | ORAL | 2 refills | Status: DC | PRN

## 2017-12-15 MED ORDER — ALLOPURINOL 300 MG PO TABS: 300.0000 mg | ORAL_TABLET | Freq: Every day | ORAL | 1 refills | Status: DC

## 2017-12-15 MED ORDER — TRAMADOL HCL 50 MG PO TABS: 50.0000 mg | ORAL_TABLET | Freq: Two times a day (BID) | ORAL | 0 refills | Status: DC

## 2017-12-15 NOTE — Addendum Note (Signed)
Addended by: Chevis Pretty on: 12/15/2017 09:35 AM   Modules accepted: Orders

## 2017-12-15 NOTE — Progress Notes (Signed)
Subjective:    Patient ID: Joseph [name], male    DOB: 1941-09-06, 77 y.o.   MRN: [id number]  HPI   Joseph [name] is here today for follow up of chronic medical problem.  Outpatient Encounter Medications as of 12/15/2017  Medication Sig  . allopurinol (ZYLOPRIM) 300 MG tablet Take 1 tablet (300 mg total) by mouth daily.  . benazepril (LOTENSIN) 20 MG tablet TAKE 1 TABLET BY MOUTH ONCE DAILY  . clonazePAM (KLONOPIN) 0.5 MG tablet TAKE 1 TABLET BY MOUTH TWICE DAILY AS NEEDED  . docusate sodium (COLACE) 100 MG capsule Take 100 mg by mouth daily as needed for mild constipation.  . fish oil-omega-3 fatty acids 1000 MG capsule Take 2 g by mouth daily.  . hydrochlorothiazide (HYDRODIURIL) 25 MG tablet Take 1 tablet (25 mg total) by mouth daily.  . meclizine (ANTIVERT) 25 MG tablet Take 0.5-1 tablets (12.5-25 mg total) 3 (three) times daily as needed by mouth for dizziness.  . metoprolol tartrate (LOPRESSOR) 25 MG tablet Take 0.5 tablets (12.5 mg total) by mouth 2 (two) times daily.  . Multiple Vitamins-Minerals (COMPLETE MULTIVITAMIN/MINERAL PO) Take 1 tablet by mouth daily.  Marland Kitchen NIFEdipine (PROCARDIA-XL/ADALAT-CC/NIFEDICAL-XL) 30 MG 24 hr tablet Take 1 tablet (30 mg total) by mouth daily.  . Potassium 99 MG TABS Take 1 tablet (99 mg total) by mouth daily.  . traMADol (ULTRAM) 50 MG tablet TAKE 1 TABLET BY MOUTH TWICE DAILY  . zolpidem (AMBIEN) 10 MG tablet Take 1 tablet (10 mg total) by mouth at bedtime as needed. for sleep    1. Essential hypertension, benign  No c/o chest pain, sob or headache. Does not check blood pressure at home. BP Readings from Last 3 Encounters:  12/15/17 114/76  10/05/17 136/86  09/11/17 118/68     2. Raynaud's disease without gangrene  uses hand warmers when his fingers fel cold  3. Thoracic ascending aortic aneurysm ([place])  Had ct angiogram 07/16/17 which showed no changes  4. Benign prostatic hyperplasia without lower urinary tract symptoms  No problem passing  water  5. Hyperlipidemia with target LDL less than 100  Not really watching diet  6. Primary insomnia  On Lorrin Mais which helps him sleep  7. Idiopathic chronic gout of multiple sites without tophus  No recent flare ups  8. Severe obesity (BMI >= 40) (HCC)  No recent weight changes  9. Hypokalemia  No c/o of extremity cramping.    New complaints: none  Social history: Starting to get over the death of his wife   Review of Systems  Constitutional: Negative for activity change and appetite change.  HENT: Negative.   Eyes: Negative for pain.  Respiratory: Negative for shortness of breath.   Cardiovascular: Negative for chest pain, palpitations and leg swelling.  Gastrointestinal: Negative for abdominal pain.  Endocrine: Negative for polydipsia.  Genitourinary: Negative.   Skin: Negative for rash.  Neurological: Negative for dizziness, weakness and headaches.  Hematological: Does not bruise/bleed easily.  Psychiatric/Behavioral: Negative.   All other systems reviewed and are negative.      Objective:   Physical Exam  Constitutional: He is oriented to person, place, and time. He appears well-developed and well-nourished.  HENT:  Head: Normocephalic.  Right Ear: External ear normal.  Left Ear: External ear normal.  Nose: Nose normal.  Mouth/Throat: Oropharynx is clear and moist.  Eyes: EOM are normal. Pupils are equal, round, and reactive to light.  Neck: Normal range of motion. Neck supple. No JVD present.  No thyromegaly present.  Cardiovascular: Normal rate, regular rhythm, normal heart sounds and intact distal pulses. Exam reveals no gallop and no friction rub.  No murmur heard. Pulmonary/Chest: Effort normal and breath sounds normal. No respiratory distress. He has no wheezes. He has no rales. He exhibits no tenderness.  Abdominal: Soft. Bowel sounds are normal. He exhibits no mass. There is no tenderness.  Genitourinary: Prostate normal and penis normal.    Musculoskeletal: Normal range of motion. He exhibits no edema.  Lymphadenopathy:    He has no cervical adenopathy.  Neurological: He is alert and oriented to person, place, and time. No cranial nerve deficit.  Skin: Skin is warm and dry.  Psychiatric: He has a normal mood and affect. His behavior is normal. Judgment and thought content normal.   Blood pressure 114/76, pulse 76, temperature (!) 96.6 F (35.9 C), temperature source Oral, height 5\' 8"  (1.727 m), weight 255 lb (115.7 kg).      Assessment & Plan:  1. Essential hypertension, benign Low sodium diet - benazepril (LOTENSIN) 20 MG tablet; Take 1 tablet (20 mg total) by mouth daily.  Dispense: 90 tablet; Refill: 1  2. Raynaud's disease without gangrene Continue hand warmers - traMADol (ULTRAM) 50 MG tablet; Take 1 tablet (50 mg total) by mouth 2 (two) times daily.  Dispense: 60 tablet; Refill: 0  3. Thoracic ascending aortic aneurysm ([place]) Will recheck in august - NIFEdipine (PROCARDIA-XL/ADALAT-CC/NIFEDICAL-XL) 30 MG 24 hr tablet; Take 1 tablet (30 mg total) by mouth daily.  Dispense: 90 tablet; Refill: 1  4. Benign prostatic hyperplasia without lower urinary tract symptoms Follow up with urology as needed  5. Hyperlipidemia with target LDL less than 100 Low fat diet  6. Primary insomnia Bedtime routine - zolpidem (AMBIEN) 10 MG tablet; Take 1 tablet (10 mg total) by mouth at bedtime as needed. for sleep  Dispense: 30 tablet; Refill: 2  7. Idiopathic chronic gout of multiple sites without tophus - allopurinol (ZYLOPRIM) 300 MG tablet; Take 1 tablet (300 mg total) by mouth daily.  Dispense: 90 tablet; Refill: 1  8. Severe obesity (BMI >= 40) (HCC) Discussed diet and exercise for person with BMI >25 Will recheck weight in 3-6 months  9. Hypokalemia  10. GAD (generalized anxiety disorder) Stress management - clonazePAM (KLONOPIN) 0.5 MG tablet; Take 1 tablet (0.5 mg total) by mouth 2 (two) times daily as needed.   Dispense: 60 tablet; Refill: 2    Labs pending Health maintenance reviewed Diet and exercise encouraged Continue all meds Follow up  In 3 months   [place], FNP

## 2017-12-15 NOTE — Patient Instructions (Signed)
Stress and Stress Management Stress is a normal reaction to life events. It is what you feel when life demands more than you are used to or more than you can handle. Some stress can be useful. For example, the stress reaction can help you catch the last bus of the day, study for a test, or meet a deadline at work. But stress that occurs too often or for too long can cause problems. It can affect your emotional health and interfere with relationships and normal daily activities. Too much stress can weaken your immune system and increase your risk for physical illness. If you already have a medical problem, stress can make it worse. What are the causes? All sorts of life events may cause stress. An event that causes stress for one person may not be stressful for another person. Major life events commonly cause stress. These may be positive or negative. Examples include losing your job, moving into a new home, getting married, having a baby, or losing a loved one. Less obvious life events may also cause stress, especially if they occur day after day or in combination. Examples include working long hours, driving in traffic, caring for children, being in debt, or being in a difficult relationship. What are the signs or symptoms? Stress may cause emotional symptoms including, the following:  Anxiety. This is feeling worried, afraid, on edge, overwhelmed, or out of control.  Anger. This is feeling irritated or impatient.  Depression. This is feeling sad, down, helpless, or guilty.  Difficulty focusing, remembering, or making decisions.  Stress may cause physical symptoms, including the following:  Aches and pains. These may affect your head, neck, back, stomach, or other areas of your body.  Tight muscles or clenched jaw.  Low energy or trouble sleeping.  Stress may cause unhealthy behaviors, including the following:  Eating to feel better (overeating) or skipping meals.  Sleeping too little,  too much, or both.  Working too much or putting off tasks (procrastination).  Smoking, drinking alcohol, or using drugs to feel better.  How is this diagnosed? Stress is diagnosed through an assessment by your health care provider. Your health care provider will ask questions about your symptoms and any stressful life events.Your health care provider will also ask about your medical history and may order blood tests or other tests. Certain medical conditions and medicine can cause physical symptoms similar to stress. Mental illness can cause emotional symptoms and unhealthy behaviors similar to stress. Your health care provider may refer you to a mental health professional for further evaluation. How is this treated? Stress management is the recommended treatment for stress.The goals of stress management are reducing stressful life events and coping with stress in healthy ways. Techniques for reducing stressful life events include the following:  Stress identification. Self-monitor for stress and identify what causes stress for you. These skills may help you to avoid some stressful events.  Time management. Set your priorities, keep a calendar of events, and learn to say "no." These tools can help you avoid making too many commitments.  Techniques for coping with stress include the following:  Rethinking the problem. Try to think realistically about stressful events rather than ignoring them or overreacting. Try to find the positives in a stressful situation rather than focusing on the negatives.  Exercise. Physical exercise can release both physical and emotional tension. The key is to find a form of exercise you enjoy and do it regularly.  Relaxation techniques. These relax the body and  mind. Examples include yoga, meditation, tai chi, biofeedback, deep breathing, progressive muscle relaxation, listening to music, being out in nature, journaling, and other hobbies. Again, the key is to find  one or more that you enjoy and can do regularly.  Healthy lifestyle. Eat a balanced diet, get plenty of sleep, and do not smoke. Avoid using alcohol or drugs to relax.  Strong support network. Spend time with family, friends, or other people you enjoy being around.Express your feelings and talk things over with someone you trust.  Counseling or talktherapy with a mental health professional may be helpful if you are having difficulty managing stress on your own. Medicine is typically not recommended for the treatment of stress.Talk to your health care provider if you think you need medicine for symptoms of stress. Follow these instructions at home:  Keep all follow-up visits as directed by your health care provider.  Take all medicines as directed by your health care provider. Contact a health care provider if:  Your symptoms get worse or you start having new symptoms.  You feel overwhelmed by your problems and can no longer manage them on your own. Get help right away if:  You feel like hurting yourself or someone else. This information is not intended to replace advice given to you by your health care provider. Make sure you discuss any questions you have with your health care provider. Document Released: 05/13/2001 Document Revised: 04/24/2016 Document Reviewed: 07/12/2013 Elsevier Interactive Patient Education  2017 [employer] Inc[employer]  

## 2017-12-16 LAB — CMP14+EGFR
ALT: 22 IU/L (ref 0–44)
AST: 24 IU/L (ref 0–40)
Albumin/Globulin Ratio: 1.5 (ref 1.2–2.2)
Albumin: 4 g/dL (ref 3.5–4.8)
Alkaline Phosphatase: 60 IU/L (ref 39–117)
BUN/Creatinine Ratio: 12 (ref 10–24)
BUN: 11 mg/dL (ref 8–27)
Bilirubin Total: 0.5 mg/dL (ref 0.0–1.2)
CHLORIDE: 99 mmol/L (ref 96–106)
CO2: 23 mmol/L (ref 20–29)
CREATININE: 0.93 mg/dL (ref 0.76–1.27)
Calcium: 10 mg/dL (ref 8.6–10.2)
GFR calc Af Amer: 93 mL/min/{1.73_m2} (ref >59)
GFR calc non Af Amer: 81 mL/min/{1.73_m2} (ref >59)
GLOBULIN, TOTAL: 2.6 g/dL (ref 1.5–4.5)
Glucose: 95 mg/dL (ref 65–99)
POTASSIUM: 3.8 mmol/L (ref 3.5–5.2)
SODIUM: 141 mmol/L (ref 134–144)
Total Protein: 6.6 g/dL (ref 6.0–8.5)

## 2017-12-16 LAB — LIPID PANEL
Chol/HDL Ratio: 4.3 ratio (ref 0.0–5.0)
Cholesterol, Total: 173 mg/dL (ref 100–199)
HDL: 40 mg/dL
LDL Calculated: 99 mg/dL (ref 0–99)
Triglycerides: 172 mg/dL — ABNORMAL HIGH (ref 0–149)
VLDL Cholesterol Cal: 34 mg/dL (ref 5–40)

## 2017-12-22 ENCOUNTER — Telehealth: Payer: Self-pay | Admitting: Nurse Practitioner

## 2017-12-22 NOTE — Telephone Encounter (Signed)
Spoke with pt regarding RXs Pt will bring letter from insurance for review

## 2017-12-24 ENCOUNTER — Telehealth: Payer: Self-pay

## 2017-12-24 NOTE — Telephone Encounter (Signed)
Getting a prior auth for tramadol  What is the DX for taking this med?

## 2017-12-24 NOTE — Telephone Encounter (Signed)
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

## 2018-01-19 ENCOUNTER — Telehealth: Payer: Self-pay | Admitting: Nurse Practitioner

## 2018-01-19 NOTE — Telephone Encounter (Signed)
Was a prior auth done for his Lorrin Mais?

## 2018-01-19 NOTE — Telephone Encounter (Signed)
You will need to find out if they will pay for belsomra before we send it in. That is only n=med similar

## 2018-01-19 NOTE — Telephone Encounter (Signed)
No I have not rec'd anything for prior auth on Ambien

## 2018-01-20 NOTE — Telephone Encounter (Signed)
He will call pharmacy and have prior authorization for [place] sent over.

## 2018-01-21 ENCOUNTER — Telehealth: Payer: Self-pay

## 2018-01-21 MED ORDER — TRAZODONE HCL 50 MG PO TABS: 25.0000 mg | ORAL_TABLET | Freq: Every evening | ORAL | 3 refills | Status: DC | PRN

## 2018-01-21 NOTE — Telephone Encounter (Signed)
Insurance denied Zolpidem   Must have tried and failed Trazodone and Belsomra

## 2018-01-21 NOTE — Telephone Encounter (Signed)
insurance will not pay for ambien- sent in trazadone prescription to [facility]- let me know if does not help

## 2018-02-22 ENCOUNTER — Other Ambulatory Visit: Payer: Self-pay | Admitting: Cardiothoracic Surgery

## 2018-02-22 DIAGNOSIS — I712 Thoracic aortic aneurysm, without rupture, unspecified: Secondary | ICD-10-CM

## 2018-02-22 DIAGNOSIS — I7121 Aneurysm of the ascending aorta, without rupture: Secondary | ICD-10-CM

## 2018-03-02 ENCOUNTER — Ambulatory Visit: Payer: Self-pay | Admitting: Cardiothoracic Surgery

## 2018-03-12 ENCOUNTER — Other Ambulatory Visit: Payer: Self-pay | Admitting: Nurse Practitioner

## 2018-03-12 DIAGNOSIS — I7300 Raynaud's syndrome without gangrene: Secondary | ICD-10-CM

## 2018-03-18 ENCOUNTER — Other Ambulatory Visit: Payer: Self-pay | Admitting: Nurse Practitioner

## 2018-03-18 DIAGNOSIS — I7300 Raynaud's syndrome without gangrene: Secondary | ICD-10-CM

## 2018-03-25 ENCOUNTER — Ambulatory Visit: Payer: Self-pay | Admitting: Cardiothoracic Surgery

## 2018-03-29 ENCOUNTER — Encounter: Payer: Self-pay | Admitting: Nurse Practitioner

## 2018-03-29 ENCOUNTER — Ambulatory Visit (INDEPENDENT_AMBULATORY_CARE_PROVIDER_SITE_OTHER): Payer: Medicare HMO | Admitting: Nurse Practitioner

## 2018-03-29 VITALS — BP 111/71 | HR 76 | Temp 96.9°F | Ht 68.0 in | Wt 260.0 lb

## 2018-03-29 DIAGNOSIS — E785 Hyperlipidemia, unspecified: Secondary | ICD-10-CM

## 2018-03-29 DIAGNOSIS — N400 Enlarged prostate without lower urinary tract symptoms: Secondary | ICD-10-CM | POA: Diagnosis not present

## 2018-03-29 DIAGNOSIS — I8393 Asymptomatic varicose veins of bilateral lower extremities: Secondary | ICD-10-CM

## 2018-03-29 DIAGNOSIS — F411 Generalized anxiety disorder: Secondary | ICD-10-CM | POA: Diagnosis not present

## 2018-03-29 DIAGNOSIS — I7121 Aneurysm of the ascending aorta, without rupture: Secondary | ICD-10-CM

## 2018-03-29 DIAGNOSIS — I10 Essential (primary) hypertension: Secondary | ICD-10-CM | POA: Diagnosis not present

## 2018-03-29 DIAGNOSIS — I7300 Raynaud's syndrome without gangrene: Secondary | ICD-10-CM | POA: Diagnosis not present

## 2018-03-29 DIAGNOSIS — M1A09X0 Idiopathic chronic gout, multiple sites, without tophus (tophi): Secondary | ICD-10-CM

## 2018-03-29 DIAGNOSIS — I712 Thoracic aortic aneurysm, without rupture: Secondary | ICD-10-CM | POA: Diagnosis not present

## 2018-03-29 DIAGNOSIS — F5101 Primary insomnia: Secondary | ICD-10-CM

## 2018-03-29 LAB — CMP14+EGFR
A/G RATIO: 1.6 (ref 1.2–2.2)
ALK PHOS: 55 IU/L (ref 39–117)
ALT: 21 IU/L (ref 0–44)
AST: 20 IU/L (ref 0–40)
Albumin: 4.1 g/dL (ref 3.5–4.8)
BILIRUBIN TOTAL: 0.4 mg/dL (ref 0.0–1.2)
BUN/Creatinine Ratio: 13 (ref 10–24)
BUN: 14 mg/dL (ref 8–27)
CHLORIDE: 100 mmol/L (ref 96–106)
CO2: 24 mmol/L (ref 20–29)
Calcium: 9.8 mg/dL (ref 8.6–10.2)
Creatinine, Ser: 1.04 mg/dL (ref 0.76–1.27)
GFR calc Af Amer: 81 mL/min/{1.73_m2} (ref >59)
GFR calc non Af Amer: 70 mL/min/{1.73_m2} (ref >59)
GLUCOSE: 94 mg/dL (ref 65–99)
Globulin, Total: 2.6 g/dL (ref 1.5–4.5)
POTASSIUM: 3.8 mmol/L (ref 3.5–5.2)
Sodium: 141 mmol/L (ref 134–144)
Total Protein: 6.7 g/dL (ref 6.0–8.5)

## 2018-03-29 LAB — LIPID PANEL
CHOLESTEROL TOTAL: 195 mg/dL (ref 100–199)
Chol/HDL Ratio: 4.4 ratio (ref 0.0–5.0)
HDL: 44 mg/dL (ref >39)
LDL Calculated: 118 mg/dL — ABNORMAL HIGH (ref 0–99)
TRIGLYCERIDES: 166 mg/dL — AB (ref 0–149)
VLDL CHOLESTEROL CAL: 33 mg/dL (ref 5–40)

## 2018-03-29 MED ORDER — TRAMADOL HCL 50 MG PO TABS: 50.0000 mg | ORAL_TABLET | Freq: Two times a day (BID) | ORAL | 1 refills | Status: DC

## 2018-03-29 MED ORDER — MECLIZINE HCL 25 MG PO TABS: ORAL_TABLET | ORAL | 1 refills | Status: DC

## 2018-03-29 MED ORDER — CLONAZEPAM 0.5 MG PO TABS: 0.5000 mg | ORAL_TABLET | Freq: Two times a day (BID) | ORAL | 2 refills | Status: DC | PRN

## 2018-03-29 MED ORDER — ALLOPURINOL 300 MG PO TABS: 300.0000 mg | ORAL_TABLET | Freq: Every day | ORAL | 1 refills | Status: DC

## 2018-03-29 MED ORDER — BENAZEPRIL HCL 20 MG PO TABS: 20.0000 mg | ORAL_TABLET | Freq: Every day | ORAL | 1 refills | Status: DC

## 2018-03-29 MED ORDER — METOPROLOL TARTRATE 25 MG PO TABS: 12.5000 mg | ORAL_TABLET | Freq: Two times a day (BID) | ORAL | 3 refills | Status: DC

## 2018-03-29 MED ORDER — HYDROCHLOROTHIAZIDE 25 MG PO TABS: 25.0000 mg | ORAL_TABLET | Freq: Every day | ORAL | 3 refills | Status: DC

## 2018-03-29 MED ORDER — NIFEDIPINE ER OSMOTIC RELEASE 30 MG PO TB24: 30.0000 mg | ORAL_TABLET | Freq: Every day | ORAL | 1 refills | Status: DC

## 2018-03-29 NOTE — Patient Instructions (Signed)

## 2018-03-29 NOTE — Progress Notes (Signed)
Subjective:    Patient ID: Joseph [name], male    DOB: [birth year], 77 y.o.   MRN: [id number]  HPI  Joseph [name] is here today for follow up of chronic medical problem.  Outpatient Encounter Medications as of 03/29/2018  Medication Sig  . allopurinol (ZYLOPRIM) 300 MG tablet Take 1 tablet (300 mg total) by mouth daily.  . benazepril (LOTENSIN) 20 MG tablet Take 1 tablet (20 mg total) by mouth daily.  . clonazePAM (KLONOPIN) 0.5 MG tablet Take 1 tablet (0.5 mg total) by mouth 2 (two) times daily as needed.  . docusate sodium (COLACE) 100 MG capsule Take 100 mg by mouth daily as needed for mild constipation.  . fish oil-omega-3 fatty acids 1000 MG capsule Take 2 g by mouth daily.  . hydrochlorothiazide (HYDRODIURIL) 25 MG tablet Take 1 tablet (25 mg total) by mouth daily.  . meclizine (ANTIVERT) 25 MG tablet TAKE 1/2 TO 1 (ONE-HALF TO ONE) TABLET BY MOUTH THREE TIMES DAILY AS NEEDED FOR DIZZINESS  . metoprolol tartrate (LOPRESSOR) 25 MG tablet Take 0.5 tablets (12.5 mg total) by mouth 2 (two) times daily.  . Multiple Vitamins-Minerals (COMPLETE MULTIVITAMIN/MINERAL PO) Take 1 tablet by mouth daily.  Marland Kitchen NIFEdipine (PROCARDIA-XL/ADALAT-CC/NIFEDICAL-XL) 30 MG 24 hr tablet Take 1 tablet (30 mg total) by mouth daily.  . Potassium 99 MG TABS Take 1 tablet (99 mg total) by mouth daily.  . traMADol (ULTRAM) 50 MG tablet Take 1 tablet (50 mg total) by mouth 2 (two) times daily.  . traZODone (DESYREL) 50 MG tablet Take 0.5-1 tablets (25-50 mg total) by mouth at bedtime as needed for sleep.      1. Varicose veins of both lower extremities, unspecified whether complicated Patient has multiple varicosities and legs will hurt when is walking a lot.    2. Essential hypertension, benign  No c/o chest pain, sob or headache. Does not check blood pressure at home. BP Readings from Last 3 Encounters:  12/15/17 114/76  10/05/17 136/86  09/11/17 118/68     3. Raynaud's disease without gangrene  This is  currently not bothering him now that weather is warming up.  4. Benign prostatic hyperplasia without lower urinary tract symptoms  No recent voiding problems. Has not seen his urologist lately  5. Severe obesity (BMI >= 40) (HCC)  No recent weight changes  6. Primary insomnia  Doing some better with sleeping.   7. Hyperlipidemia with target LDL less than 100  Does not watch diet very closely  8. Idiopathic chronic gout of multiple sites without tophus  No recent gout flare ups.  9.      arthritis          Takes ultram nightly. If he does not take ultram then he hurts all night long and                cannot sleep. 10.    Thoracic aortic aneurysm           Is due for recheck mid may   New complaints: None today  Social history: Wife died last year. He is starting to get out and become very active with some other senior citizens in the community.    Review of Systems  Constitutional: Negative for activity change and appetite change.  HENT: Negative.   Eyes: Negative for pain.  Respiratory: Negative for shortness of breath.   Cardiovascular: Negative for chest pain, palpitations and leg swelling.  Gastrointestinal: Negative for abdominal pain.  Endocrine:  Negative for polydipsia.  Genitourinary: Negative.   Skin: Negative for rash.  Neurological: Negative for dizziness, weakness and headaches.  Hematological: Does not bruise/bleed easily.  Psychiatric/Behavioral: Negative.   All other systems reviewed and are negative.      Objective:   Physical Exam  Constitutional: He is oriented to person, place, and time.  HENT:  Head: Normocephalic.  Nose: Nose normal.  Mouth/Throat: Oropharynx is clear and moist.  Eyes: Pupils are equal, round, and reactive to light. EOM are normal.  Neck: Normal range of motion and phonation normal. Neck supple. No JVD present. Carotid bruit is not present. No thyroid mass and no thyromegaly present.  Cardiovascular: Normal rate and regular  rhythm.  Pulmonary/Chest: Effort normal and breath sounds normal. No respiratory distress.  Abdominal: Soft. Normal appearance, normal aorta and bowel sounds are normal. There is no tenderness.  Musculoskeletal: Normal range of motion. He exhibits edema (2+ bol lower ext).  Lymphadenopathy:    He has no cervical adenopathy.  Neurological: He is alert and oriented to person, place, and time.  Skin: Skin is warm and dry.  Psychiatric: Judgment normal.   BP 111/71   Pulse 76   Temp (!) 96.9 F (36.1 C) (Oral)   Ht _0  (1.727 m)   Wt 260 lb (117.9 kg)   BMI 39.53 kg/m         Assessment & Plan:  1. Varicose veins of both lower extremities, unspecified whether complicated Compression socks will help circulation and pain in legs from walking  2. Essential hypertension, benign Low sodium diet - benazepril (LOTENSIN) 20 MG tablet; Take 1 tablet (20 mg total) by mouth daily.  Dispense: 90 tablet; Refill: 1 - hydrochlorothiazide (HYDRODIURIL) 25 MG tablet; Take 1 tablet (25 mg total) by mouth daily.  Dispense: 90 tablet; Refill: 3 - metoprolol tartrate (LOPRESSOR) 25 MG tablet; Take 0.5 tablets (12.5 mg total) by mouth 2 (two) times daily.  Dispense: 90 tablet; Refill: 3 - CMP14+EGFR  3. Raynaud's disease without gangrene Keep hands warm - traMADol (ULTRAM) 50 MG tablet; Take 1 tablet (50 mg total) by mouth 2 (two) times daily.  Dispense: 60 tablet; Refill: 1  4. Benign prostatic hyperplasia without lower urinary tract symptoms  5. Severe obesity (BMI >= 40) (HCC) Discussed diet and exercise for person with BMI >25 Will recheck weight in 3-6 months   6. Primary insomnia Bedtime routine  7. Hyperlipidemia with target LDL less than 100 Low fat diet - Lipid panel  8. Idiopathic chronic gout of multiple sites without tophus - allopurinol (ZYLOPRIM) 300 MG tablet; Take 1 tablet (300 mg total) by mouth daily.  Dispense: 90 tablet; Refill: 1  9. GAD (generalized anxiety  disorder) Stress management - clonazePAM (KLONOPIN) 0.5 MG tablet; Take 1 tablet (0.5 mg total) by mouth 2 (two) times daily as needed.  Dispense: 60 tablet; Refill: 2  10. Thoracic ascending aortic aneurysm (HCC) - NIFEdipine (PROCARDIA-XL/ADALAT-CC/NIFEDICAL-XL) 30 MG 24 hr tablet; Take 1 tablet (30 mg total) by mouth daily.  Dispense: 90 tablet; Refill: 1    Labs pending Health maintenance reviewed Diet and exercise encouraged Continue all meds Follow up  In 3 months   [place], FNP

## 2018-04-15 ENCOUNTER — Ambulatory Visit: Payer: Medicare HMO | Admitting: Cardiothoracic Surgery

## 2018-04-15 ENCOUNTER — Ambulatory Visit
Admission: RE | Admit: 2018-04-15 | Discharge: 2018-04-15 | Disposition: A | Discharge status: Home / Self Care | Payer: Medicare HMO | Source: Ambulatory Visit | Attending: Cardiothoracic Surgery | Admitting: Cardiothoracic Surgery

## 2018-04-15 VITALS — BP 116/72 | HR 92 | Resp 20 | Ht 68.0 in | Wt 280.0 lb

## 2018-04-15 DIAGNOSIS — I7121 Aneurysm of the ascending aorta, without rupture: Secondary | ICD-10-CM

## 2018-04-15 DIAGNOSIS — I712 Thoracic aortic aneurysm, without rupture, unspecified: Secondary | ICD-10-CM

## 2018-04-15 MED ORDER — IOPAMIDOL (ISOVUE-370) INJECTION 76%
75.0000 mL | Freq: Once | INTRAVENOUS | Status: AC | PRN
  Administered 2018-04-15: 75 mL via INTRAVENOUS

## 2018-04-15 NOTE — Patient Instructions (Signed)

## 2018-04-15 NOTE — Progress Notes (Signed)
[name] [address]       [name] [id number]             [phone]                    Joseph [name] [name] [place] Medical Record #[id number] Date of Birth: [birth year]  Referring: Hinda Lenis Primary Care: Chevis Pretty, Joseph [name]  Chief Complaint:    Chief Complaint  Patient presents with  . Thoracic Aortic Aneurysm    8 month f/u with Chest CTA    History of Present Illness:    Joseph [name] [name] 77 y.o. male is seen in the office in follow-up for a known dilatation of the ascending aorta.. Since last seen he has had no change in his cardiac status.  He denies angina or significant shortness of breath.  The patient notes that on June 04 2015 holiday he developed epigastric and substernal chest pain radiating to the left neck into both arms, with numbness in his hands. At the time he was camping at [facility] and went to the [facility] emergency room and was admitted. He was hospitalized for several days.  The patient did have a CT scan of the chest which revealed a 4.8 cm dilatation of the ascending aorta. On discharge the patient was told to see a thoracic surgeon. He has had serial CT od chest here since that time and returns today with repeat scan .    Current Activity/ Functional Status:  Patient is independent with mobility/ambulation, transfers, ADL's, IADL's.   Zubrod Score: At the time of surgery this patient's most appropriate activity status/level should be described as: []     0    Normal activity, no symptoms [x]     1    Restricted in physical strenuous activity but ambulatory, able to do out light work []     2    Ambulatory and capable of self care, unable to do work activities, up and about               >50 % of waking hours                              []     3    Only limited self care, in bed greater than 50% of waking hours []     4    Completely disabled, no self care, confined to bed or chair []     5    Moribund   Past Medical History:    Diagnosis Date  . Anxiety   . Ascending aortic aneurysm ([place])   . Essential hypertension   . GERD (gastroesophageal reflux disease)   . Gout   . History of kidney stones   . Insomnia     Past Surgical History:  Procedure Laterality Date  . CARDIAC CATHETERIZATION N/A 06/27/2015   Procedure: Left Heart Cath and Coronary Angiography;  Surgeon: Troy Sine, MD;  Location: [facility];  Service: Cardiovascular;  Laterality: N/A;  . HERNIA REPAIR    . KIDNEY STONE SURGERY      Family History  Problem Relation Age of Onset  . Heart disease Father   . Diabetes Sister   . Heart disease Brother   . Deep vein thrombosis Brother   . Congestive Heart Failure Brother    patient has history of sudden death of his father at age  2 and his brother at age 68-  these were attributed to myocardial infarctions but there is no documented autopsy . The patient notes both of them died at home suddenly Patient's brother has died of heart failure and colon cancer last week   Social History   Socioeconomic History  . Marital status: Married    Spouse name: Not on file  . Number of children: Not on file  . Years of education: Not on file  . Highest education level: Not on file  Occupational History  . Not on file  Social Needs  . Financial resource strain: Not on file  . Food insecurity:    Worry: Not on file    Inability: Not on file  . Transportation needs:    Medical: Not on file    Non-medical: Not on file  Tobacco Use  . Smoking status: Former Smoker    Packs/day: 0.50    Years: 1.00    Pack years: 0.50    Types: Cigarettes    Last attempt to quit: 03/22/1958    Years since quitting: 60.1  . Smokeless tobacco: Former Systems developer    Types: Chew  . Tobacco comment: QUIT CHEWING IN 1989  Substance and Sexual Activity  . Alcohol use: No    Alcohol/week: 0.0 oz  . Drug use: No  . Sexual activity: Never  Lifestyle  . Physical activity:    Days per week: Not on file    Minutes  per session: Not on file  . Stress: Not on file  Relationships  . Social connections:    Talks on phone: Not on file    Gets together: Not on file    Attends religious service: Not on file    Active member of club or organization: Not on file    Attends meetings of clubs or organizations: Not on file    Relationship status: Not on file  . Intimate partner violence:    Fear of current or ex partner: Not on file    Emotionally abused: Not on file    Physically abused: Not on file    Forced sexual activity: Not on file  Other Topics Concern  . Not on file  Social History Narrative  . Not on file    Social History   Tobacco Use  Smoking Status Former Smoker  . Packs/day: 0.50  . Years: 1.00  . Pack years: 0.50  . Types: Cigarettes  . Last attempt to quit: 03/22/1958  . Years since quitting: 60.1  Smokeless Tobacco Former Systems developer  . Types: Chew  Tobacco Comment   QUIT CHEWING IN 1989    Social History   Substance and Sexual Activity  Alcohol Use No  . Alcohol/week: 0.0 oz     Allergies  Allergen Reactions  . Carafate [Sucralfate] Other (See Comments)    TOOK SKIN FROM HIS MOUTH  . Ciprofloxacin Hcl Other (See Comments)    CAN'T REMEMBER  . Prevacid [Lansoprazole] Other (See Comments)    MADE HIM FEEL BAD  . Sulfa Antibiotics Diarrhea    SEVERE    Current Outpatient Medications  Medication Sig Dispense Refill  . allopurinol (ZYLOPRIM) 300 MG tablet Take 1 tablet (300 mg total) by mouth daily. 90 tablet 1  . benazepril (LOTENSIN) 20 MG tablet Take 1 tablet (20 mg total) by mouth daily. 90 tablet 1  . clonazePAM (KLONOPIN) 0.5 MG tablet Take 1 tablet (0.5 mg total) by mouth 2 (two) times daily as needed. 60 tablet  2  . docusate sodium (COLACE) 100 MG capsule Take 100 mg by mouth daily as needed for mild constipation.    . fish oil-omega-3 fatty acids 1000 MG capsule Take 2 g by mouth daily.    . hydrochlorothiazide (HYDRODIURIL) 25 MG tablet Take 1 tablet (25 mg  total) by mouth daily. 90 tablet 3  . meclizine (ANTIVERT) 25 MG tablet TAKE 1/2 TO 1 (ONE-HALF TO ONE) TABLET BY MOUTH THREE TIMES DAILY AS NEEDED FOR DIZZINESS 30 tablet 1  . metoprolol tartrate (LOPRESSOR) 25 MG tablet Take 0.5 tablets (12.5 mg total) by mouth 2 (two) times daily. 90 tablet 3  . Multiple Vitamins-Minerals (COMPLETE MULTIVITAMIN/MINERAL PO) Take 1 tablet by mouth daily.    Marland Kitchen NIFEdipine (PROCARDIA-XL/ADALAT-CC/NIFEDICAL-XL) 30 MG 24 hr tablet Take 1 tablet (30 mg total) by mouth daily. 90 tablet 1  . Potassium 99 MG TABS Take 1 tablet (99 mg total) by mouth daily. 330 each 1  . traMADol (ULTRAM) 50 MG tablet Take 1 tablet (50 mg total) by mouth 2 (two) times daily. 60 tablet 1  . traZODone (DESYREL) 50 MG tablet Take 0.5-1 tablets (25-50 mg total) by mouth at bedtime as needed for sleep. 30 tablet 3   No current facility-administered medications for this visit.       Review of Systems:  Review of Systems  Constitutional: Negative.   HENT: Negative.   Eyes: Negative.   Respiratory: Positive for shortness of breath. Negative for cough, hemoptysis, sputum production and wheezing.   Cardiovascular: Positive for leg swelling. Negative for chest pain, palpitations, orthopnea, claudication and PND.  Gastrointestinal: Negative.   Genitourinary: Positive for frequency. Negative for dysuria, flank pain, hematuria and urgency.  Musculoskeletal: Negative.   Skin: Negative.   Neurological: Negative.   Endo/Heme/Allergies: Negative.   Psychiatric/Behavioral: Negative.      Physical Exam: BP 116/72   Pulse 92   Resp 20   Ht 5\' 8"  (1.727 m)   Wt 280 lb (127 kg)   SpO2 94% Comment: RA  BMI 42.57 kg/m   PHYSICAL EXAMINATION: General appearance: alert, cooperative and no distress Head: Normocephalic, without obvious abnormality, atraumatic Neck: no adenopathy, no carotid bruit, no JVD, supple, symmetrical, trachea midline and thyroid not enlarged, symmetric, no  tenderness/mass/nodules Lymph nodes: Cervical, supraclavicular, and axillary nodes normal. Resp: clear to auscultation bilaterally Back: symmetric, no curvature. ROM normal. No CVA tenderness. Cardio: regular rate and rhythm, S1, S2 normal, no murmur, click, rub or gallop GI: soft, non-tender; bowel sounds normal; no masses,  no organomegaly Extremities: extremities normal, atraumatic, no cyanosis or edema Neurologic: Grossly normal severe varicose veins in both lower extremities below the knee, palpable DP and PT pulses bilaterally No  murmur of aortic insufficiency.   Diagnostic Studies & Laboratory data:     Recent Radiology Findings:  Ct Angio Chest Aorta W/cm &/or Wo/cm  Result Date: 04/15/2018 CLINICAL DATA:  Thoracic aortic aneurysm without rupture EXAM: CT ANGIOGRAPHY CHEST WITH CONTRAST TECHNIQUE: Multidetector CT imaging of the chest was performed using the standard protocol during bolus administration of intravenous contrast. Multiplanar CT image reconstructions and MIPs were obtained to evaluate the vascular anatomy. CONTRAST:  58mL ISOVUE-370 IOPAMIDOL (ISOVUE-370) INJECTION 76% COMPARISON:  CT scan of July 16, 2017. FINDINGS: Cardiovascular: Atherosclerosis of thoracic aorta is noted without dissection. Great vessels are widely patent. Grossly stable 4.8 cm ascending thoracic aortic aneurysm is noted. Transverse aortic arch measures 3.2 cm. Proximal descending thoracic aorta measures 3.1 cm. No pericardial effusion is noted. Mediastinum/Nodes:  No enlarged mediastinal, hilar, or axillary lymph nodes. Thyroid gland, trachea, and esophagus demonstrate no significant findings. Lungs/Pleura: No pneumothorax or pleural effusion is noted. Minimal subsegmental atelectasis or scarring is noted in the right lower lobe. Upper Abdomen: No acute abnormality. Musculoskeletal: No chest wall abnormality. No acute or significant osseous findings. Review of the MIP images confirms the above findings.  IMPRESSION: Grossly stable 4.8 cm ascending thoracic aortic aneurysm. Ascending thoracic aortic aneurysm. Recommend semi-annual imaging followup by CTA or MRA and referral to cardiothoracic surgery if not already obtained. This recommendation follows 2010 ACCF/AHA/AATS/ACR/ASA/SCA/SCAI/SIR/STS/SVM Guidelines for the Diagnosis and Management of Patients With Thoracic Aortic Disease. Circulation. 2010; 121: G315-V761. Aortic Atherosclerosis (ICD10-I70.0). Electronically Signed   By: Marijo Conception, M.D.   On: 04/15/2018 15:14   I have independently reviewed the above radiology studies  and reviewed the findings with the patient.   Ct Angio Chest Aorta W/cm &/or Wo/cm  Result Date: 07/16/2017 CLINICAL DATA:  Thoracic aortic aneurysm without rupture. EXAM: CT ANGIOGRAPHY CHEST WITH CONTRAST TECHNIQUE: Multidetector CT imaging of the chest was performed using the standard protocol during bolus administration of intravenous contrast. Multiplanar CT image reconstructions and MIPs were obtained to evaluate the vascular anatomy. CONTRAST:  75 mL of Isovue 370 intravenously. COMPARISON:  CT scan of August 28, 2016. FINDINGS: Cardiovascular: Stable 4.9 cm ascending thoracic aortic aneurysm is noted without rupture. Atherosclerosis of thoracic aorta is noted. Great vessels are widely patent without significant stenosis. No pericardial effusion is noted. Transverse aortic arch measures 3.0 cm. Proximal descending thoracic aorta measures 3.0 cm. Mediastinum/Nodes: No enlarged mediastinal, hilar, or axillary lymph nodes. Thyroid gland, trachea, and esophagus demonstrate no significant findings. Lungs/Pleura: No pneumothorax or pleural effusion is noted. Stable right basilar scarring is noted. No acute consolidative process is noted. Upper Abdomen: Stable hepatic cyst. Stable right renal cyst. Stable nonobstructive left renal calculus. Musculoskeletal: No chest wall abnormality. No acute or significant osseous findings.  Review of the MIP images confirms the above findings. IMPRESSION: Stable 4.9 cm ascending thoracic aortic aneurysm. Ascending thoracic aortic aneurysm. Recommend semi-annual imaging followup by CTA or MRA and referral to cardiothoracic surgery if not already obtained. This recommendation follows 2010 ACCF/AHA/AATS/ACR/ASA/SCA/SCAI/SIR/STS/SVM Guidelines for the Diagnosis and Management of Patients With Thoracic Aortic Disease. Circulation. 2010; 121: Y073-X106. Aortic Atherosclerosis (ICD10-I70.0). Electronically Signed   By: Marijo Conception, M.D.   On: 07/16/2017 12:16   Ct Angio Chest Aorta W &/or Wo Contrast  Result Date: 08/28/2016 CLINICAL DATA:  Thoracic ascending aortic aneurysm.  Follow-up. EXAM: CT ANGIOGRAPHY CHEST WITH CONTRAST TECHNIQUE: Multidetector CT imaging of the chest was performed using the standard protocol during bolus administration of intravenous contrast. Multiplanar CT image reconstructions and MIPs were obtained to evaluate the vascular anatomy. Creatinine was obtained on site at [facility] at [address] Results: Creatinine 1.0 mg/dL. CONTRAST:  75 mL Isovue 370 COMPARISON:  12/20/2015 FINDINGS: Cardiovascular: Ascending thoracic aorta near the sinotubular junction measures roughly 4.2 cm and unchanged. The mid ascending thoracic aorta measures up to 4.9 cm and stable. Proximal aortic arch measures 3.7 cm and stable. Distal aortic arch measures 3.2 cm and stable. The mid ascending thoracic aorta measures 2.9 cm and stable. 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 Overall, there is mild atherosclerotic disease throughout the thoracic aorta. There is also focal plaque at the 4 o'clock position of the aorta at the hiatus on image 104. Celiac  trunk, proximal SMA and bilateral renal arteries are patent. There are coronary artery calcifications. Normal caliber of the main pulmonary arteries. Negative for  thoracic aortic dissection. Mediastinum/Nodes: No chest lymphadenopathy. Lungs/Pleura: Trachea and mainstem bronchi are patent. Stable scarring in the right lower lobe and along the right major fissure. Otherwise, the lungs are clear. No pleural effusions. Upper Abdomen: Stable 2.5 cm low-density structure in the central aspect of liver probably represents a cyst. There is an exophytic right renal cyst. Normal appearance of the adrenal glands. Small stone in the left kidney without hydronephrosis. Probable small left renal cysts. Musculoskeletal: No chest wall abnormality. No acute or significant osseous findings. Review of the MIP images confirms the above findings. IMPRESSION: Stable fusiform aneurysm of the ascending thoracic aorta measuring up to 4.9 cm. Recommend semi-annual imaging followup by CTA or MRA and referral to cardiothoracic surgery if not already obtained. This recommendation follows 2010 ACCF/AHA/AATS/ACR/ASA/SCA/SCAI/SIR/STS/SVM Guidelines for the Diagnosis and Management of Patients With Thoracic Aortic Disease. Circulation. 2010; 121: U440-H474 No acute chest abnormality. Electronically Signed   By: Markus Daft M.D.   On: 08/28/2016 14:05       Recent Lab Findings: Lab Results  Component Value Date   WBC 8.1 06/27/2015   HGB 13.2 06/27/2015   HCT 39.1 06/27/2015   PLT 182 06/27/2015   GLUCOSE 94 03/29/2018   CHOL 195 03/29/2018   TRIG 166 (H) 03/29/2018   HDL 44 03/29/2018   LDLCALC 118 (H) 03/29/2018   ALT 21 03/29/2018   AST 20 03/29/2018   NA 141 03/29/2018   K 3.8 03/29/2018   CL 100 03/29/2018   CREATININE 1.04 03/29/2018   BUN 14 03/29/2018   CO2 24 03/29/2018   INR 1.08 06/27/2015   Ct Angio Chest Aorta W/cm &/or Wo/cm  12/20/2015  CLINICAL DATA:  TAA. Occ chest pain. 10# wt loss. Nonsmoker. No prev surg or hx ca. Prev Ct 2002 purged, report available. EXAM: CT ANGIOGRAPHY CHEST WITH CONTRAST TECHNIQUE: Multidetector CT imaging of the chest was performed using  the standard protocol during bolus administration of intravenous contrast. Multiplanar CT image reconstructions and MIPs were obtained to evaluate the vascular anatomy. CONTRAST:  75 cc Isovue 370 COMPARISON:  Report of previous CT exam 07/06/2001, CT of the chest 06/04/2015 from [facility]: Heart: Coronary artery calcification is present. Trace pericardial effusion. Heart size is normal. Vascular structures: Ascending aortic aneurysm is 4.8 cm. No dissection. Moderate atherosclerotic calcification of the thoracic aorta. Moderate atherosclerotic calcification of the descending aorta, particularly at the level of the diaphragmatic hiatus. Mediastinum/thyroid: Left thyroid nodule measures 1.0 cm. No significant mediastinal, hilar, or axillary adenopathy. Lungs/Airways: No pulmonary nodules, pleural effusions, or infiltrates. Upper abdomen: Hypervascular 2.3 cm lesion identified at the dome of the right hepatic lobe, stable in appearance and consistent with benign hemangioma. A stable cyst is seen in the lateral segment of the left hepatic lobe measuring 2.4 cm. Findings are consistent with benign cyst. Small splenules are incidentally noted in the left upper quadrant the abdomen. Small hiatal hernia. Chest wall/osseous structures: Mild spondylosis of the thoracic spine. No suspicious lytic or blastic lesions are identified. Review of the MIP images confirms the above findings. IMPRESSION: 1. Ascending aortic aneurysm, 4.8 cm. Appearance is stable compared with previous CT of the chest from July 2016. Recommend semi-annual imaging followup by CTA or MRA and referral to cardiothoracic surgery if not already obtained. This recommendation follows 2010 ACCF/AHA/AATS/ACR/ASA/SCA/SCAI/SIR/STS/SVM Guidelines for the Diagnosis and Management of  Patients With Thoracic Aortic Disease. Circulation. 2010; 121: U981-X914 2. Coronary artery disease. 3. Trace pericardial effusion. 4. Benign appearing left thyroid  nodule. No further evaluation is felt to be necessary based on consensus criteria. 5. Stable benign liver lesions. Electronically Signed   By: Nolon Nations M.D.   On: 12/20/2015 15:05    I have independently reviewed the above radiology studies  and reviewed the findings with the patient.     Stress test:done [facility]  CLINICAL DATA:  Chest pain  EXAM: MYOCARDIAL IMAGING WITH SPECT (REST AND PHARMACOLOGIC-STRESS)  GATED LEFT VENTRICULAR WALL MOTION STUDY  LEFT VENTRICULAR EJECTION FRACTION  TECHNIQUE: Standard myocardial SPECT imaging was performed after resting intravenous injection of 11.0 mCi Tc-70m sestamibi. Subsequently, intravenous infusion of Lexiscan was performed under the supervision of the Cardiology staff. At peak effect of the drug, 32.6 mCi Tc-65m sestamibi was injected intravenously and standard myocardial SPECT imaging was performed. Quantitative gated imaging was also performed to evaluate left ventricular wall motion, and estimate left ventricular ejection fraction.  COMPARISON:  None.  FINDINGS: Perfusion: Regional images of the left ventricular myocardium show no fixed defect to suggest infarct or reversible lesion to suggest ischemic change. No abnormality is identified on the SPECT study.  Wall Motion: Left ventricle is rather prominent. There is mild apical hypokinesis. No other wall motion abnormality is identified in the left ventricle.  Left Ventricular Ejection Fraction: 47 %  End diastolic volume 782 ml  End systolic volume 69 ml  IMPRESSION: 1. No reversible ischemia or infarction.  2. There is left ventricular prominence. There is mild apical hypokinesis. No other wall motion abnormalities are identified.  3. Left ventricular ejection fraction 47%  4. Intermediate risk stress test findings*.  *2012 Appropriate Use Criteria for Coronary Revascularization Focused Update: J Am Coll Cardiol.  9562;13(0):865-784. http://content.airportbarriers.com.aspx?articleid=1201161   Electronically Signed   By: Lowella Grip III M.D.   On: 06/06/2015 13:05  No significant chest pain symptoms reported  No significant arrhythmia noted  This is not a final report, NM images to follow  No significant ST segment changes or arrhythmias were noted during  stress  Uneventful Lexiscan injection.  Nuclear perfusion report to follow under separate cover  CT: CLINICAL DATA:  Epigastric pain. Shortness of breath, nausea. Back pain. History of hypertension, Raynaud's disease, hernia repair.  EXAM: CT ANGIOGRAPHY CHEST, ABDOMEN AND PELVIS  TECHNIQUE: Multidetector CT imaging through the chest, abdomen and pelvis was performed using the standard protocol during bolus administration of intravenous contrast. Multiplanar reconstructed images and MIPs were obtained and reviewed to evaluate the vascular anatomy.  CONTRAST:  100 cc Omnipaque 350  COMPARISON:  None.  FINDINGS: CTA CHEST FINDINGS  Mediastinum: Fusiform ascending aorta is enlarged, 4.8 cm with moderate calcific atherosclerosis of the aortic arch and descending aorta. Subcentimeter focal intimal hematoma descending aorta. No abnormal density within upper along the course of the aorta. Small amount of free fluid in the is sulcus, normal. No dissection, suspicious luminal irregularity/ulceration, contrast extravasation or periaortic hematoma. Heart size is mildly enlarged. Mild coronary artery calcifications. No pericardial fluid collection. No mediastinal lymphadenopathy.  Lungs: No pleural effusion or focal consolidation. Minimal pleural thickening/scarring of the RIGHT major fissure. Tracheobronchial tree is patent and midline, no pneumothorax.  Soft tissues and osseous structures: Mild degenerative change of the thoracic spine. No destructive bony lesions. Mild gynecomastia.  Review of the MIP images confirms  the above findings.  CTA ABDOMEN AND PELVIS FINDINGS  Retroperitoneum: Aortoiliac vessels are  normal in course and caliber with moderate to severe calcific atherosclerosis. Celiac trunk origin is widely patent. Mild calcific atherosclerosis of the origin of the renal arteries, superior mesenteric artery. Inferior mesenteric artery is patent.  Organs: 2.5 cm flash filling hemangioma RIGHT lobe of the liver. 2.3 cm low-density cyst segment 4. No intrahepatic biliary dilatation. Spleen, gallbladder are normal. Fatty pancreas, otherwise unremarkable. Thickened adrenal glands can be seen with hyperplasia without discrete nodule.  GI tract: Small hiatal hernia. Stomach, small large bowel are normal in course and caliber without inflammatory changes though sensitivity may be decreased by lack of enteric contrast.  GU tract: Kidneys are orthotopic, normal size morphology. 4.7 cm exophytic LEFT lower pole cyst. 3 mm LEFT upper pole nonobstructing nephrolithiasis. 2.2 cm exophytic RIGHT interpolar cyst. Prostate is partially distended. Moderate prostatomegaly invading the base of the bladder.  Peritoneum: No intraperitoneal free fluid or free air. No lymphadenopathy by CT size criteria.  Soft tissues and osseous structures: Moderate to large LEFT, small RIGHT fat containing inguinal hernias. Small fat containing umbilical hernia. Grade 1 L5-S1 anterolisthesis, chronic bilateral L5 pars interarticularis defects, moderate to severe L5-S1 neural foraminal narrowing.  Review of the MIP images confirms the above findings.  IMPRESSION: CTA CHEST: Fusiform 4.8 cm ascending aortic aneurysm without dissection or acute vascular process. Recommend semi-annual imaging followup by CTA or MRA and referral to cardiothoracic surgery if not already obtained. This recommendation follows 2010 ACCF/AHA/AATS/ACR/ASA/SCA/SCAI/SIR/STS/SVM Guidelines for the Diagnosis and Management of Patients With  Thoracic Aortic Disease. Circulation. 2010; 121: K539-J673.  No acute cardiopulmonary process.  CTA ABDOMEN AND PELVIS: Atherosclerosis of the aortoiliac vessels without high-grade stenosis or acute vascular process.  No acute intra-abdominal or pelvic process. 3 mm nonobstructing LEFT upper pole nephrolithiasis.  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  Prostatomegaly.   Electronically Signed   By: Elon Alas M.D.   On: 06/05/2015 00:21  ECHO: Transthoracic Echocardiography Report (TTE)  Demographics  Patient Name      KEENEN [name]     Gender                Male  Patient Number    [id number]   Race                  Caucasian  Visit Number      [id number]    Room Number           341  Accession Number  [id number]  Date of Study         06/05/2015  Date of Birth     1941/01/31     Referring Physician   ED Fay Records, MD  Age               55 year(s)     Sonographer           Marikate Tennis,                                                         RDCS, BS                                   Interpreting          Rozann Lesches, MD  Physician Procedure Type of Study  TTE procedure: ECHOCARDIOGRAM FOLLOW UP/ LIMITED W DOPPLER. Procedure date Date: 06/05/2015 Start: 09:20 AM Technical Quality: Adequate visualization Study Location: Portable Indications: Chest pain. Patient Status: Routine Height: 68.11 inches Weight: 255.74 pounds BSA: 2.27 m2 BMI: 38.76 kg/m2 Rhythm: Normal sinus rhythm HR: 82 bpm BP: 116/69 mmHg Conclusions Summary Mild concentric left ventricular hypertrophy Normal left ventricular systolic function with no appreciable segmental abnormality. Mild AR. Signature ------------------------------------------------------------------------------  Electronically signed by Rozann Lesches, MD(Interpreting physician) on 06/05/2015  01:27 PM ------------------------------------------------------------------------------ Findings Mitral  Valve Structurally normal mitral valve with good mobility and no significant regurgitation by color flow doppler examination. Aortic Valve Structurally normal aortic valve with good leaflet mobility, and mild regurgitation by color flow doppler examination. Tricuspid Valve Tricuspid valve is structurally normal. Mild tricuspid regurgitation by color flow doppler examination. Pulmonic Valve Pulmonic valve is structurally normal. No Doppler evidence of pulmonic stenosis or insufficiency. Left Atrium Normal size left atrium. Left Ventricle Mild concentric left ventricular hypertrophy Normal left ventricular systolic function with no appreciable segmental abnormality. Right Atrium Mild right atrial enlargement Right Ventricle Mildly dilated right ventricle. Pericardial Effusion No evidence of pericardial effusion. Miscellaneous Aortic root dimension within normal limits. IVC dilated. M-Mode/2D Measurements & Calculations  LV Diastolic Dimension:  LV Systolic Dimension:     LA Dimension: 3.42 cmAO  5.56 cm                  2.92 cm                    Root Dimension: 3.89 cm  LV FS:47.48 %            LV Volume Diastolic: 295  LV PW Diastolic: 1.4 cm  ml  Septum Diastolic: 1.88   LV Volume Systolic: 41.6  cm                       ml                           LV EDV/LV EDV Index: 127   LA/Aorta: 0.88                           ml/56 m2LV ESV/LV ESV                           Index: 50.5 ml/22 m2                           EF Calculated: 60.24 % Doppler Measurements & Calculations  MV Peak E-Wave: 72.7 cm/s AV Peak Velocity: 111  MV Peak A-Wave: 86.4 cm/s cm/s                     Estimated RVSP: 32.8 mmHg  MV E/A Ratio: 0.84        AV Peak Gradient: 4.93   Estimated RAP:10 mmHg  MV Peak Gradient: 2.11    mmHg  mmHg                                                     PV Peak Velocity: 102  TDI E' Velocity: 15  cm/s                           cm/s                                                      PV Peak Gradient: 4.16                                                     mmHg  E/E' prime 4.9 [address] [place], [place] [address] [phone]   Cardiac Cath: 2016: dR kELLY  Mr. Hettich is a 32 -year-old gentleman who has been documented to have a 4.8 cm ascending aortic aneurysm and is followed by Dr. Servando Snare. He was recently seen by Dr. Domenic Polite for Cardiologic evaluation. He has experience mild intermittent episodes of chest pain and shortness of breath. A nuclear study was interpreted as intermediate risk with an ejection fraction at 47% with apical hypokinesis without definitive ischemia. Definitive cardiac catheterization was recommended.   The patient was brought to the second floor [facility] cath lab in the postabsorptive state. The patient was premedicated with Versed 2 mg and fentanyl 50 mcg. A right radial approach was utilized after an Allen's test verified adequate circulation. The right radial artery was punctured via the Seldinger technique, and a 6 [place] Glidesheath Slender was inserted without difficulty. A radial cocktail consisting of Verapamil, IV nitroglycerin, and lidocaine was administered. Weight adjusted heparin was administered. A safety J wire was advanced into the ascending aorta. Diagnostic catheterization was done with a 5 [place] TIG 4.0 catheter. A 5 French pigtail catheter was used for left ventriculography. A TR radial band was applied for hemostasis. The patient left the catheterization laboratory in stable condition. There were no immediate complications during the procedure.      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  Right Coronary Artery  Mid RCA lesion, 35% stenosed.  Wall Motion              Left Heart   Left Ventricle The left ventricular size is normal. There is mild left ventricular systolic dysfunction. The left ventricular ejection fraction is 45-50% by  visual estimate. Mild LV dysfunction with an ejection fraction at 45-50% and a subtle area mild mid inferior hypocontractility.    Aorta The ascending aorta is dilated.    Coronary Diagrams   Diagnostic Diagram            Aortic Size Index=  4.8       /Body surface area is 2.47 meters squared. = 2.05    < 2.75 cm/m2      4% risk per year 2.75 to 4.25          8% risk per year > 4.25 cm/m2    20% risk per year    Assessment / Plan:  1 Fusiform 4.8 cm ascending aortic aneurysm without dissection or acute vascular process-with a tricuspid aortic valve on echocardiogram without evidence of aortic stenosis or aortic insufficiency  The patient's dilated a sending aorta is discussed with he and his son  in detail, he was cautioned to maintain good control of his blood pressure, avoid strenuous lifting, and avoid use of Cipro and like antibiotics.  Plan to see him back in 1 year with a follow-up CTA of the chest  Patient was warned about not using Cipro and similar antibiotics. Recent studies have raised concern that fluoroquinolone antibiotics could be associated with an increased risk of aortic aneurysm Fluoroquinolones have non-antimicrobial properties that might jeopardise the integrity of the extracellular matrix of the vascular wall In a  propensity score matched cohort study in [place], there was a 66% increased rate of aortic aneurysm or dissection associated with oral fluoroquinolone use, compared with amoxicillin use, within a 60 day risk period from start of treatment      Grace Isaac MD      [address] [place] [address] Office [phone]   Beeper [phone]  04/15/2018 3:50 PM

## 2018-05-06 ENCOUNTER — Other Ambulatory Visit: Payer: Self-pay | Admitting: Nurse Practitioner

## 2018-05-31 ENCOUNTER — Other Ambulatory Visit: Payer: Self-pay | Admitting: Nurse Practitioner

## 2018-05-31 DIAGNOSIS — I7300 Raynaud's syndrome without gangrene: Secondary | ICD-10-CM

## 2018-06-01 MED ORDER — TRAMADOL HCL 50 MG PO TABS: 50.0000 mg | ORAL_TABLET | Freq: Two times a day (BID) | ORAL | 0 refills | Status: DC

## 2018-06-01 NOTE — Telephone Encounter (Signed)
ntbs for pain meds

## 2018-06-01 NOTE — Telephone Encounter (Signed)
I will fill pain meds today but he has to be seen in future for pain med refils.

## 2018-06-01 NOTE — Telephone Encounter (Signed)
Patient states that he is to be seen every 3 months and was last seen 03/29/18

## 2018-06-01 NOTE — Addendum Note (Signed)
Addended by: Chevis Pretty on: 06/01/2018 06:51 PM   Modules accepted: Orders

## 2018-06-02 ENCOUNTER — Other Ambulatory Visit: Payer: Self-pay | Admitting: Nurse Practitioner

## 2018-06-02 DIAGNOSIS — I7300 Raynaud's syndrome without gangrene: Secondary | ICD-10-CM

## 2018-06-02 MED ORDER — TRAMADOL HCL 50 MG PO TABS: 50.0000 mg | ORAL_TABLET | Freq: Two times a day (BID) | ORAL | 0 refills | Status: DC

## 2018-06-02 NOTE — Telephone Encounter (Signed)
Message left for patient , pain medication script was filled.

## 2018-06-29 ENCOUNTER — Encounter: Payer: Self-pay | Admitting: Nurse Practitioner

## 2018-06-29 ENCOUNTER — Ambulatory Visit (INDEPENDENT_AMBULATORY_CARE_PROVIDER_SITE_OTHER): Payer: Medicare HMO | Admitting: Nurse Practitioner

## 2018-06-29 VITALS — BP 111/63 | HR 67 | Ht 68.0 in | Wt 258.5 lb

## 2018-06-29 DIAGNOSIS — Z6839 Body mass index (BMI) 39.0-39.9, adult: Secondary | ICD-10-CM | POA: Diagnosis not present

## 2018-06-29 DIAGNOSIS — N400 Enlarged prostate without lower urinary tract symptoms: Secondary | ICD-10-CM

## 2018-06-29 DIAGNOSIS — E785 Hyperlipidemia, unspecified: Secondary | ICD-10-CM

## 2018-06-29 DIAGNOSIS — I7300 Raynaud's syndrome without gangrene: Secondary | ICD-10-CM

## 2018-06-29 DIAGNOSIS — M1A09X0 Idiopathic chronic gout, multiple sites, without tophus (tophi): Secondary | ICD-10-CM

## 2018-06-29 DIAGNOSIS — I7121 Aneurysm of the ascending aorta, without rupture: Secondary | ICD-10-CM

## 2018-06-29 DIAGNOSIS — F5101 Primary insomnia: Secondary | ICD-10-CM

## 2018-06-29 DIAGNOSIS — F411 Generalized anxiety disorder: Secondary | ICD-10-CM

## 2018-06-29 DIAGNOSIS — I10 Essential (primary) hypertension: Secondary | ICD-10-CM

## 2018-06-29 DIAGNOSIS — E876 Hypokalemia: Secondary | ICD-10-CM

## 2018-06-29 DIAGNOSIS — I8393 Asymptomatic varicose veins of bilateral lower extremities: Secondary | ICD-10-CM

## 2018-06-29 DIAGNOSIS — I712 Thoracic aortic aneurysm, without rupture: Secondary | ICD-10-CM | POA: Diagnosis not present

## 2018-06-29 MED ORDER — METOPROLOL TARTRATE 25 MG PO TABS: 12.5000 mg | ORAL_TABLET | Freq: Two times a day (BID) | ORAL | 3 refills | Status: DC

## 2018-06-29 MED ORDER — CLONAZEPAM 0.5 MG PO TABS: 0.5000 mg | ORAL_TABLET | Freq: Two times a day (BID) | ORAL | 2 refills | Status: DC | PRN

## 2018-06-29 MED ORDER — HYDROCHLOROTHIAZIDE 25 MG PO TABS: 25.0000 mg | ORAL_TABLET | Freq: Every day | ORAL | 3 refills | Status: DC

## 2018-06-29 MED ORDER — ALLOPURINOL 300 MG PO TABS: 300.0000 mg | ORAL_TABLET | Freq: Every day | ORAL | 1 refills | Status: DC

## 2018-06-29 MED ORDER — BENAZEPRIL HCL 20 MG PO TABS: 20.0000 mg | ORAL_TABLET | Freq: Every day | ORAL | 1 refills | Status: DC

## 2018-06-29 MED ORDER — TRAMADOL HCL 50 MG PO TABS: 50.0000 mg | ORAL_TABLET | Freq: Two times a day (BID) | ORAL | 2 refills | Status: AC

## 2018-06-29 MED ORDER — TRAZODONE HCL 50 MG PO TABS: 25.0000 mg | ORAL_TABLET | Freq: Every evening | ORAL | 5 refills | Status: DC | PRN

## 2018-06-29 MED ORDER — NIFEDIPINE ER OSMOTIC RELEASE 30 MG PO TB24: 30.0000 mg | ORAL_TABLET | Freq: Every day | ORAL | 1 refills | Status: DC

## 2018-06-29 NOTE — Progress Notes (Signed)
Subjective:    Patient ID: Joseph [name], male    DOB: 02/21/1941, 77 y.o.   MRN: [id number]   Chief Complaint: Medical Management of Chronic Issues   HPI:  1. Varicose veins of both lower extremities, unspecified whether complicated Wears compression socks and takes baby aspirin daily.   2. Essential hypertension, benign  No c/o chest pain, sob or headache. Does not check blood pressure at home. BP Readings from Last 3 Encounters:  06/29/18 111/63  04/15/18 116/72  03/29/18 111/71     3. Raynaud's disease without gangrene   does not bother hi when th e weather is warm  4. Thoracic ascending aortic aneurysm (HCC)  Last ct angio was 04/15/18 and aneurysm is stable.   5. Benign prostatic hyperplasia without lower urinary tract symptoms  denies any problems passing hie urine. No trouble with stream or urgency.  6. Severe obesity (BMI >= 40) (HCC)  Weight is down 22lbs since last visit. He has cut back on how much he eats  7. Primary insomnia  Is not able to sleep without taking trazadone .  8. Hypokalemia  No c/o cramps in lwer ext  9. Hyperlipidemia with target LDL less than 100  Has been trying to watch diet  10. Idiopathic chronic gout of multiple sites without tophus  No recent flare ups.  11.    Chronic bil leg pain          Takes ultram at night to keep from hurting.   Outpatient Encounter Medications as of 06/29/2018  Medication Sig  . allopurinol (ZYLOPRIM) 300 MG tablet Take 1 tablet (300 mg total) by mouth daily.  . benazepril (LOTENSIN) 20 MG tablet Take 1 tablet (20 mg total) by mouth daily.  . clonazePAM (KLONOPIN) 0.5 MG tablet Take 1 tablet (0.5 mg total) by mouth 2 (two) times daily as needed.  . docusate sodium (COLACE) 100 MG capsule Take 100 mg by mouth daily as needed for mild constipation.  . fish oil-omega-3 fatty acids 1000 MG capsule Take 2 g by mouth daily.  . hydrochlorothiazide (HYDRODIURIL) 25 MG tablet Take 1 tablet (25 mg total) by mouth daily.    . meclizine (ANTIVERT) 25 MG tablet TAKE 1/2 TO 1 (ONE-HALF TO ONE) TABLET BY MOUTH THREE TIMES DAILY AS NEEDED FOR DIZZINESS  . metoprolol tartrate (LOPRESSOR) 25 MG tablet Take 0.5 tablets (12.5 mg total) by mouth 2 (two) times daily.  . Multiple Vitamins-Minerals (COMPLETE MULTIVITAMIN/MINERAL PO) Take 1 tablet by mouth daily.  Marland Kitchen NIFEdipine (PROCARDIA-XL/ADALAT-CC/NIFEDICAL-XL) 30 MG 24 hr tablet Take 1 tablet (30 mg total) by mouth daily.  . Potassium 99 MG TABS Take 1 tablet (99 mg total) by mouth daily.  . traMADol (ULTRAM) 50 MG tablet Take 1 tablet (50 mg total) by mouth 2 (two) times daily.  . traZODone (DESYREL) 50 MG tablet TAKE 1/2 TO 1 TABLET BY MOUTH AT BEDTIME AS NEEDED FOR SLEEP       New complaints: Nothing bothering him today  Social history: Lives alone- wife passed away a year ago. He is doing well. Gets out daily and meets people for breakfast.   Review of Systems  Constitutional: Negative for activity change and appetite change.  HENT: Negative.   Eyes: Negative for pain.  Respiratory: Negative for shortness of breath.   Cardiovascular: Negative for chest pain, palpitations and leg swelling.  Gastrointestinal: Negative for abdominal pain.  Endocrine: Negative for polydipsia.  Genitourinary: Negative.   Skin: Negative for rash.  Neurological:  Negative for dizziness, weakness and headaches.  Hematological: Does not bruise/bleed easily.  Psychiatric/Behavioral: Negative.   All other systems reviewed and are negative.      Objective:   Physical Exam  Constitutional: He is oriented to person, place, and time.  HENT:  Head: Normocephalic.  Nose: Nose normal.  Mouth/Throat: Oropharynx is clear and moist.  Eyes: Pupils are equal, round, and reactive to light. EOM are normal.  Neck: Normal range of motion and phonation normal. Neck supple. No JVD present. Carotid bruit is not present. No thyroid mass and no thyromegaly present.  Cardiovascular: Normal rate  and regular rhythm.  Pulmonary/Chest: Effort normal and breath sounds normal. No respiratory distress.  Abdominal: Soft. Normal appearance, normal aorta and bowel sounds are normal. There is no tenderness.  Musculoskeletal: Normal range of motion.  Lymphadenopathy:    He has no cervical adenopathy.  Neurological: He is alert and oriented to person, place, and time.  Skin: Skin is warm and dry.  Psychiatric: Judgment normal.   BP 111/63   Pulse 67   Ht 5\' 8"  (1.727 m)   Wt 258 lb 8 oz (117.3 kg)   BMI 39.30 kg/m         Assessment & Plan:  DAJOUR PIERPOINT comes in today with chief complaint of Medical Management of Chronic Issues   Diagnosis and orders addressed:  1. Varicose veins of both lower extremities, unspecified whether complicated Elevate legs when sitting  2. Essential hypertension, benign Low sodium diet - hydrochlorothiazide (HYDRODIURIL) 25 MG tablet; Take 1 tablet (25 mg total) by mouth daily.  Dispense: 90 tablet; Refill: 3 - benazepril (LOTENSIN) 20 MG tablet; Take 1 tablet (20 mg total) by mouth daily.  Dispense: 90 tablet; Refill: 1 - metoprolol tartrate (LOPRESSOR) 25 MG tablet; Take 0.5 tablets (12.5 mg total) by mouth 2 (two) times daily.  Dispense: 90 tablet; Refill: 3  3. Raynaud's disease without gangrene - traMADol (ULTRAM) 50 MG tablet; Take 1 tablet (50 mg total) by mouth 2 (two) times daily.  Dispense: 60 tablet; Refill: 2  4. Thoracic ascending aortic aneurysm ([facility]) Will continue to check yearly - NIFEdipine (PROCARDIA-XL/ADALAT-CC/NIFEDICAL-XL) 30 MG 24 hr tablet; Take 1 tablet (30 mg total) by mouth daily.  Dispense: 90 tablet; Refill: 1  5. Benign prostatic hyperplasia without lower urinary tract symptoms If develop symptoms needs to be checked  6. BMI 39.0-39.9,adult Discussed diet and exercise for person with BMI >25 Will recheck weight in 3-6 months  7. Primary insomnia Bedtime routine - traZODone (DESYREL) 50 MG tablet; Take 0.5-1  tablets (25-50 mg total) by mouth at bedtime as needed. for sleep  Dispense: 30 tablet; Refill: 5  8. Hypokalemia  9. Hyperlipidemia with target LDL less than 100 Low fta diet  10. Idiopathic chronic gout of multiple sites without tophus wathc purine in diet - allopurinol (ZYLOPRIM) 300 MG tablet; Take 1 tablet (300 mg total) by mouth daily.  Dispense: 90 tablet; Refill: 1  11. GAD (generalized anxiety disorder) Stress management - clonazePAM (KLONOPIN) 0.5 MG tablet; Take 1 tablet (0.5 mg total) by mouth 2 (two) times daily as needed.  Dispense: 60 tablet; Refill: 2   Labs pending Health Maintenance reviewed Diet and exercise encouraged  Follow up plan: 3 months   Mary-Margaret Hassell Done, FNP

## 2018-06-29 NOTE — Patient Instructions (Signed)

## 2018-06-29 NOTE — Addendum Note (Signed)
Addended by: Chevis Pretty on: 06/29/2018 11:01 AM   Modules accepted: Orders

## 2018-06-30 LAB — CMP14+EGFR
A/G RATIO: 1.5 (ref 1.2–2.2)
ALT: 21 IU/L (ref 0–44)
AST: 19 IU/L (ref 0–40)
Albumin: 3.9 g/dL (ref 3.5–4.8)
Alkaline Phosphatase: 54 IU/L (ref 39–117)
BUN/Creatinine Ratio: 13 (ref 10–24)
BUN: 14 mg/dL (ref 8–27)
Bilirubin Total: 0.4 mg/dL (ref 0.0–1.2)
CALCIUM: 10.1 mg/dL (ref 8.6–10.2)
CO2: 22 mmol/L (ref 20–29)
Chloride: 100 mmol/L (ref 96–106)
Creatinine, Ser: 1.06 mg/dL (ref 0.76–1.27)
GFR, EST AFRICAN AMERICAN: 79 mL/min/{1.73_m2} (ref >59)
GFR, EST NON AFRICAN AMERICAN: 68 mL/min/{1.73_m2} (ref >59)
GLUCOSE: 101 mg/dL — AB (ref 65–99)
Globulin, Total: 2.6 g/dL (ref 1.5–4.5)
POTASSIUM: 4.3 mmol/L (ref 3.5–5.2)
Sodium: 140 mmol/L (ref 134–144)
Total Protein: 6.5 g/dL (ref 6.0–8.5)

## 2018-06-30 LAB — PSA, TOTAL AND FREE
PSA FREE PCT: 22.1 %
PSA, Free: 0.73 ng/mL
Prostate Specific Ag, Serum: 3.3 ng/mL (ref 0.0–4.0)

## 2018-06-30 LAB — LIPID PANEL
Chol/HDL Ratio: 5 ratio (ref 0.0–5.0)
Cholesterol, Total: 179 mg/dL (ref 100–199)
HDL: 36 mg/dL — AB (ref >39)
LDL Calculated: 113 mg/dL — ABNORMAL HIGH (ref 0–99)
TRIGLYCERIDES: 152 mg/dL — AB (ref 0–149)
VLDL CHOLESTEROL CAL: 30 mg/dL (ref 5–40)

## 2018-07-01 ENCOUNTER — Telehealth: Payer: Self-pay | Admitting: Nurse Practitioner

## 2018-07-27 ENCOUNTER — Ambulatory Visit: Payer: Medicare HMO | Admitting: *Deleted

## 2018-08-03 ENCOUNTER — Ambulatory Visit (INDEPENDENT_AMBULATORY_CARE_PROVIDER_SITE_OTHER): Payer: Medicare HMO | Admitting: *Deleted

## 2018-08-03 ENCOUNTER — Encounter: Payer: Self-pay | Admitting: *Deleted

## 2018-08-03 VITALS — BP 125/78 | HR 86 | Ht 67.0 in | Wt 261.0 lb

## 2018-08-03 DIAGNOSIS — Z0000 Encounter for general adult medical examination without abnormal findings: Secondary | ICD-10-CM

## 2018-08-03 NOTE — Patient Instructions (Signed)
  Joseph [name] , Thank you for taking time to come for your Medicare Wellness Visit. I appreciate your ongoing commitment to your health goals. Please review the following plan we discussed and let me know if I can assist you in the future.   These are the goals we discussed: Goals    . Exercise 150 min/wk Moderate Activity       This is a list of the screening recommended for you and due dates:  Health Maintenance  Topic Date Due  . Stool Blood Test  12/04/2017  . Flu Shot  07/01/2018  . Colon Cancer Screening  03/30/2019*  . Tetanus Vaccine  10/05/2024  . Pneumonia vaccines  Completed  *Topic was postponed. The date shown is not the original due date.

## 2018-08-03 NOTE — Progress Notes (Addendum)
Subjective:   Joseph [name] is a 77 y.o. male who presents for a Medicare Annual Wellness Visit. Joseph [name] lives at home alone since his wife passed in May 2018. He has two daughters and two grandchildren. His oldest daughter lives across the street. He visits and talks with them often. He also attends church regularly and has a great support system there. He eats breakfast daily and most suppers at [employer] in [place]. He enjoys talking with the other patrons and staff. He also likes to hang out at [employer] and talk with friends. He has a daily routine and stays active. He doesn't sit around very often. This past weekend he went camping in [place], [place]. He is active around his home and does all of his own yardwork.   Review of Systems    Patient reports that his overall health is unchanged compared to last year.  Cardiac Risk Factors include: advanced age (>47men, >88 women);dyslipidemia;diabetes mellitus;male gender;hypertension;sedentary lifestyle;obesity (BMI >30kg/m2)  Psych: rests well at night and feels that he is dealing with the loss of his wife well. States that it has been hard and a difficult adjustment but that he is getting better with support.  HEENT: episodes of vertigo. Takes Meclizine and keeps a cane with him for balance  All other systems negative       Current Medications (verified) Outpatient Encounter Medications as of 08/03/2018  Medication Sig  . allopurinol (ZYLOPRIM) 300 MG tablet Take 1 tablet (300 mg total) by mouth daily.  . benazepril (LOTENSIN) 20 MG tablet Take 1 tablet (20 mg total) by mouth daily.  . Bisacodyl (BISCOLAX RE) Take 5 mg by mouth 2 (two) times daily.  . bisacodyl (BISCOLAX) 10 MG suppository Place 10 mg rectally as needed for moderate constipation.  . clonazePAM (KLONOPIN) 0.5 MG tablet Take 1 tablet (0.5 mg total) by mouth 2 (two) times daily as needed.  . fish oil-omega-3 fatty acids 1000 MG capsule Take 2 g by mouth daily.   . hydrochlorothiazide (HYDRODIURIL) 25 MG tablet Take 1 tablet (25 mg total) by mouth daily.  . meclizine (ANTIVERT) 25 MG tablet TAKE 1/2 TO 1 (ONE-HALF TO ONE) TABLET BY MOUTH THREE TIMES DAILY AS NEEDED FOR DIZZINESS  . Melatonin 5 MG TABS Take 5 mg by mouth at bedtime.  . metoprolol tartrate (LOPRESSOR) 25 MG tablet Take 0.5 tablets (12.5 mg total) by mouth 2 (two) times daily.  . Multiple Vitamins-Minerals (COMPLETE MULTIVITAMIN/MINERAL PO) Take 1 tablet by mouth daily.  Marland Kitchen NIFEdipine (PROCARDIA-XL/ADALAT-CC/NIFEDICAL-XL) 30 MG 24 hr tablet Take 1 tablet (30 mg total) by mouth daily.  . Potassium 99 MG TABS Take 1 tablet (99 mg total) by mouth daily.  . traMADol (ULTRAM) 50 MG tablet Take 1 tablet (50 mg total) by mouth 2 (two) times daily.  . traZODone (DESYREL) 50 MG tablet Take 0.5-1 tablets (25-50 mg total) by mouth at bedtime as needed. for sleep  . docusate sodium (COLACE) 100 MG capsule Take 100 mg by mouth daily as needed for mild constipation.   No facility-administered encounter medications on file as of 08/03/2018.     Allergies (verified) Carafate [sucralfate]; Ciprofloxacin hcl; Prevacid [lansoprazole]; and Sulfa antibiotics   History: Past Medical History:  Diagnosis Date  . Anxiety   . Ascending aortic aneurysm ([place])   . Essential hypertension   . GERD (gastroesophageal reflux disease)   . Gout   . History of kidney stones   . Insomnia  Past Surgical History:  Procedure Laterality Date  . CARDIAC CATHETERIZATION N/A 06/27/2015   Procedure: Left Heart Cath and Coronary Angiography;  Surgeon: Troy Sine, MD;  Location: [facility];  Service: Cardiovascular;  Laterality: N/A;  . HERNIA REPAIR    . KIDNEY STONE SURGERY     Family History  Problem Relation Age of Onset  . Heart disease Father   . Heart attack Father   . Diabetes Sister   . Heart disease Brother   . Deep vein thrombosis Brother   . Congestive Heart Failure Brother   . Cancer Brother    . Leukemia Brother   . Heart attack Brother    Social History   Socioeconomic History  . Marital status: Widowed    Spouse name: Not on file  . Number of children: 2  . Years of education: 10  . Highest education level: 10th grade  Occupational History  . 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 Financial resource strain: Not hard at all  . Food insecurity:    Worry: Never true    Inability: Never true  . Transportation needs:    Medical: No    Non-medical: No  Tobacco Use  . Smoking status: Former Smoker    Packs/day: 0.50    Years: 1.00    Pack years: 0.50    Types: Cigarettes    Last attempt to quit: 03/22/1958    Years since quitting: 60.4  . Smokeless tobacco: Former Systems developer    Types: Chew  . Tobacco comment: QUIT CHEWING IN 1989  Substance and Sexual Activity  . Alcohol use: No    Alcohol/week: 0.0 standard drinks  . Drug use: No  . Sexual activity: Not Currently  Lifestyle  . Physical activity:    Days per week: 5 days    Minutes per session: 60 min  . Stress: Only a little  Relationships  . Social connections:    Talks on phone: More than three times a week    Gets together: More than three times a week    Attends religious service: More than 4 times per year    Active member of club or organization: Yes    Attends meetings of clubs or organizations: More than 4 times per year    Relationship status: Widowed  Other Topics Concern  . Not on file  Social History Narrative  . Not on file    Tobacco Use No.  Clinical Intake:              How often do you need to have someone help you when you read instructions, pamphlets, or other written materials from your doctor or pharmacy?: 2 - Rarely What is the last grade level you completed in school?: 10     Information entered by :: Chong Sicilian, RN  Activities of Daily Living In your present state of health, do you have any difficulty  performing the following activities: 08/03/2018  Hearing? N  Vision? N  Comment scheduled for eye exam  Difficulty concentrating or making decisions? N  Walking or climbing stairs? Y  Comment has vertigo and uses a cane for that  Dressing or bathing? N  Doing errands, shopping? N  Preparing Food and eating ? N  Using the Toilet? N  In the past six months, have you accidently leaked urine? N  Do you have problems with loss of bowel control? N  Managing your Medications? N  Managing your Finances? N  Housekeeping or managing your Housekeeping? N  Some recent data might be hidden    Diet [employer]-2 scrambled eggs, 2 pieces bacon, fried apples, 2 slices of toast, 3 cups of coffee, and 1 glass of water Snack at lunch [employer] at Supper too-Homecooked type meals. Meat and vegetables Sips on water during the day   Exercise Current Exercise Habits: The patient does not participate in regular exercise at present, Exercise limited by: orthopedic condition(s);Other - see comments(balance)    Depression Screen PHQ 2/9 Scores 08/03/2018 06/29/2018 03/29/2018 12/15/2017  PHQ - 2 Score 0 0 0 0  PHQ- 9 Score - - - -     Fall Risk Fall Risk  08/03/2018 06/29/2018 03/29/2018 12/15/2017 10/05/2017  Falls in the past year? No No No No No  Risk for fall due to : History of fall(s) - - - -    Safety Is the patient's home free of loose throw rugs in walkways, pet beds, electrical cords, etc?   yes      Grab bars in the bathroom? no      Walkin shower? yes      Shower Seat? no      Handrails on the stairs?   yes      Adequate lighting?   yes  Laundry is in the basement. He throws the laundry down the stairs and then holds onto the rails going down. He carries the laundry under his left arm coming back up and holds on to the rail with right hand.   Patient Care Team: Chevis Pretty, FNP as PCP - General (Nurse Practitioner) Satira Sark, MD as Consulting Physician  (Cardiology)  Hospitalizations, surgeries, and ER visits in previous 12 months No hospitalizations, ER visits, or surgeries this past year.  Objective:    Today's Vitals   08/03/18 1156  BP: 125/78  Pulse: 86  Weight: 261 lb (118.4 kg)  Height: 5\' 7"  (1.702 m)   Body mass index is 40.88 kg/m.  Advanced Directives 08/03/2018 06/27/2015 01/08/2015 10/05/2014  Does Patient Have a Medical Advance Directive? Yes No No No  Type of Paramedic of [place];Living will - - -  Does patient want to make changes to medical advance directive? No - Patient declined - - Yes - information given  Copy of [place] in Chart? No - copy requested - - -  Would patient like information on creating a medical advance directive? - No - patient declined information Yes - Educational materials given Yes - Educational materials given    Hearing/Vision  No hearing or vision deficits noted during visit.   Cognitive Function: MMSE - Mini Mental State Exam 08/03/2018  Orientation to time 5  Orientation to Place 5  Registration 3  Attention/ Calculation 4  Recall 3  Language- name 2 objects 2  Language- repeat 1  Language- follow 3 step command 3  Language- read & follow direction 1  Write a sentence 1  Copy design 1  Total score 29       Normal Cognitive Function Screening: Yes    Immunizations and Health Maintenance Immunization History  Administered Date(s) Administered  . Influenza, High Dose Seasonal PF 09/11/2017  . Influenza,inj,Quad PF,6+ Mos 10/05/2014, 10/30/2015, 08/28/2016  . Pneumococcal Conjugate-13 04/18/2015  . Pneumococcal Polysaccharide-23 01/02/2014, 02/02/2018  . Pneumococcal-Unspecified 02/02/2018  . Tdap 10/05/2014  . Zoster 10/30/2015   Health Maintenance Due  Topic Date Due  .  COLON CANCER SCREENING ANNUAL FOBT  12/04/2017  . INFLUENZA VACCINE  07/01/2018   Health Maintenance  Topic Date Due  . COLON CANCER SCREENING ANNUAL  FOBT  12/04/2017  . INFLUENZA VACCINE  07/01/2018  . COLONOSCOPY  03/30/2019 (Originally 09/01/2011)  . TETANUS/TDAP  10/05/2024  . PNA vac Low Risk Adult  Completed        Assessment:   This is a routine wellness examination for [place].      Plan:    Goals    . Exercise 150 min/wk Moderate Activity        Health Maintenance Recommendations: Colorectal cancer screening-states that he returned an FOBT today  Additional Screening Recommendations: Lung: Low Dose CT Chest recommended if Age 55-80 years, 30 pack-year currently smoking OR have quit w/in 15years. Patient does not qualify. Hepatitis C Screening recommended: no   Keep f/u with Chevis Pretty, FNP and any other specialty appointments you may have Continue current medications Move carefully to avoid falls. Use assistive devices like a cane or walker if needed. Aim for at least 150 minutes of moderate activity a week. This can be chair exercises if necessary. Reading or puzzles are a good way to exercise your brain Stay connected with friends and family. Social connections are beneficial to your emotional and mental health.   I have personally reviewed and noted the following in the patient's chart:   . Medical and social history . Use of alcohol, tobacco or illicit drugs  . Current medications and supplements . Functional ability and status . Nutritional status . Physical activity . Advanced directives . List of other physicians . Hospitalizations, surgeries, and ER visits in previous 12 months . Vitals . Screenings to include cognitive, depression, and falls . Referrals and appointments  In addition, I have reviewed and discussed with patient certain preventive protocols, quality metrics, and best practice recommendations. A written personalized care plan for preventive services as well as general preventive health recommendations were provided to patient.     Chong Sicilian, RN   08/03/2018   I have  reviewed and agree with the above AWV documentation.   Mary-Margaret Hassell Done, FNP

## 2018-08-27 NOTE — Progress Notes (Signed)
Cardiology Office Note  Date: 08/30/2018   ID: Joseph [name], Joseph [name] 19-May-1941, MRN [id number]  PCP: Chevis Pretty, [place]  Primary Cardiologist: Rozann Lesches, MD   Chief Complaint  Patient presents with  . Cardiac follow-up    History of Present Illness: Joseph [name] is a 77 y.o. male last seen in September 2018.  He is here for a routine follow-up visit.  He does not report any exertional chest pain, no palpitations or syncope.  He stays busy with chores around the house, no major lifting or straining however.  Follow-up chest CTA from May of this year is outlined below.  Patient had follow-up with Dr. Servando Snare around that time.  Follow-up reimaging and clinical visit next May planned.  He has a fusiform 4.8 cm ascending aortic aneurysm.  I personally reviewed his ECG today which shows sinus rhythm with leftward axis and nonspecific ST-T changes.  Blood pressure is well controlled today.  He is on a combination of Lotensin, HCTZ, Lopressor, and Procardia.  Past Medical History:  Diagnosis Date  . Anxiety   . Ascending aortic aneurysm ([place])   . Essential hypertension   . GERD (gastroesophageal reflux disease)   . Gout   . History of kidney stones   . Insomnia     Past Surgical History:  Procedure Laterality Date  . CARDIAC CATHETERIZATION N/A 06/27/2015   Procedure: Left Heart Cath and Coronary Angiography;  Surgeon: Troy Sine, MD;  Location: [facility];  Service: Cardiovascular;  Laterality: N/A;  . HERNIA REPAIR    . KIDNEY STONE SURGERY      Current Outpatient Medications  Medication Sig Dispense Refill  . allopurinol (ZYLOPRIM) 300 MG tablet Take 1 tablet (300 mg total) by mouth daily. 90 tablet 1  . benazepril (LOTENSIN) 20 MG tablet Take 1 tablet (20 mg total) by mouth daily. 90 tablet 1  . clonazePAM (KLONOPIN) 0.5 MG tablet Take 1 tablet (0.5 mg total) by mouth 2 (two) times daily as needed. 60 tablet 2  . fish oil-omega-3 fatty acids 1000  MG capsule Take 2 g by mouth daily.    . hydrochlorothiazide (HYDRODIURIL) 25 MG tablet Take 1 tablet (25 mg total) by mouth daily. 90 tablet 3  . meclizine (ANTIVERT) 25 MG tablet TAKE 1/2 TO 1 (ONE-HALF TO ONE) TABLET BY MOUTH THREE TIMES DAILY AS NEEDED FOR DIZZINESS 30 tablet 1  . Melatonin 5 MG TABS Take 5 mg by mouth at bedtime.    . metoprolol tartrate (LOPRESSOR) 25 MG tablet Take 0.5 tablets (12.5 mg total) by mouth 2 (two) times daily. 90 tablet 3  . Multiple Vitamins-Minerals (COMPLETE MULTIVITAMIN/MINERAL PO) Take 1 tablet by mouth daily.    Marland Kitchen NIFEdipine (PROCARDIA-XL/ADALAT-CC/NIFEDICAL-XL) 30 MG 24 hr tablet Take 1 tablet (30 mg total) by mouth daily. 90 tablet 1  . Potassium 99 MG TABS Take 1 tablet (99 mg total) by mouth daily. 330 each 1  . traMADol (ULTRAM) 50 MG tablet Take 1 tablet (50 mg total) by mouth 2 (two) times daily. 60 tablet 2  . traZODone (DESYREL) 50 MG tablet Take 0.5-1 tablets (25-50 mg total) by mouth at bedtime as needed. for sleep 30 tablet 5   No current facility-administered medications for this visit.    Allergies:  Carafate [sucralfate]; Ciprofloxacin hcl; Prevacid [lansoprazole]; and Sulfa antibiotics  Social History: The patient  reports that he quit smoking about 60 years ago. His smoking use included cigarettes. He has a 0.50 pack-year  smoking history. He has quit using smokeless tobacco.  His smokeless tobacco use included chew. He reports that he does not drink alcohol or use drugs.   ROS:  Please see the history of present illness. Otherwise, complete review of systems is positive for arthritic pains, neuropathy.  All other systems are reviewed and negative.   Physical Exam: VS:  BP 118/76   Pulse 88   Ht 5\' 8"  (1.727 m)   Wt 258 lb 12.8 oz (117.4 kg)   SpO2 95%   BMI 39.35 kg/m , BMI Body mass index is 39.35 kg/m.  Wt Readings from Last 3 Encounters:  08/30/18 258 lb 12.8 oz (117.4 kg)  08/03/18 261 lb (118.4 kg)  06/29/18 258 lb 8 oz  (117.3 kg)    General: Obese male, appears comfortable at rest. HEENT: Conjunctiva and lids normal, oropharynx clear. Neck: Supple, no elevated JVP or carotid bruits, no thyromegaly. Lungs: Clear to auscultation, nonlabored breathing at rest. Cardiac: Regular rate and rhythm, no S3 or significant systolic murmur, no pericardial rub. Abdomen: Obese, nontender, bowel sounds present. Extremities: Mild lower leg edema, distal pulses 2+. Skin: Warm and dry. Musculoskeletal: No kyphosis. Neuropsychiatric: Alert and oriented x3, affect grossly appropriate.  ECG: I personally reviewed the tracing from 08/26/2017 which showed sinus rhythm with leftward axis and decreased R wave progression.  Recent Labwork: 06/29/2018: ALT 21; AST 19; BUN 14; Creatinine, Ser 1.06; Potassium 4.3; Sodium 140     Component Value Date/Time   CHOL 179 06/29/2018 1102   CHOL 208 (H) 03/22/2013 0928   TRIG 152 (H) 06/29/2018 1102   TRIG 142 04/18/2015 0942   TRIG 163 (H) 03/22/2013 0928   HDL 36 (L) 06/29/2018 1102   HDL 43 04/18/2015 0942   HDL 39 (L) 03/22/2013 0928   CHOLHDL 5.0 06/29/2018 1102   LDLCALC 113 (H) 06/29/2018 1102   LDLCALC 65 07/10/2014 0936   LDLCALC 136 (H) 03/22/2013 0928    Other Studies Reviewed Today:  Cardiac catheterization 06/27/2015:  Mid LAD lesion, 25% stenosed.  Mid RCA lesion, 35% stenosed.  There is mild left ventricular systolic dysfunction.  Mild LV dysfunction with an ejection fraction of 45-50% with a subtle region of mild mid inferior hypocontractility.  Mild nonobstructive 2 vessel CAD with 25% smooth stenosis in the LAD immediately after the takeoff of the first diagonal vessel; normal left circumflex artery; dominant right coronary artery with smooth 30-40% mid stenosis.  Chest CTA 04/15/2018: IMPRESSION: Grossly stable 4.8 cm ascending thoracic aortic aneurysm. Ascending thoracic aortic aneurysm. Recommend semi-annual imaging followup by CTA or MRA and  referral to cardiothoracic surgery if not already obtained. This recommendation follows 2010 ACCF/AHA/AATS/ACR/ASA/SCA/SCAI/SIR/STS/SVM Guidelines for the Diagnosis and Management of Patients With Thoracic Aortic Disease. Circulation. 2010; 121: Y774-J287.  Assessment and Plan:  1.  Asymptomatic and stable 4.8 cm ascending thoracic aortic aneurysm without dissection.  He follows with Dr. Servando Snare.  Reimaging is planned for next May.  2.  Mild coronary atherosclerosis documented cardiac catheterization in 2016.  He reports no angina symptoms.  ECG reviewed and stable.  Continue aspirin.  3.  Essential hypertension, blood pressure well controlled today.  4.  Morbid obesity.  Diet discussed.  He has not lost any significant amount of weight so far.  Current medicines were reviewed with the patient today.   Orders Placed This Encounter  Procedures  . EKG 12-Lead    Disposition: Follow-up in 1 year.  Signed, Satira Sark, MD, [facility] 08/30/2018 2:31 PM  [facility] [place], [place], [place] [address] Phone: [phone]; Fax: [phone]

## 2018-08-30 ENCOUNTER — Ambulatory Visit: Payer: Medicare HMO | Admitting: Cardiology

## 2018-08-30 ENCOUNTER — Encounter: Payer: Self-pay | Admitting: Cardiology

## 2018-08-30 VITALS — BP 118/76 | HR 88 | Ht 68.0 in | Wt 258.8 lb

## 2018-08-30 DIAGNOSIS — I7121 Aneurysm of the ascending aorta, without rupture: Secondary | ICD-10-CM

## 2018-08-30 DIAGNOSIS — I2510 Atherosclerotic heart disease of native coronary artery without angina pectoris: Secondary | ICD-10-CM

## 2018-08-30 DIAGNOSIS — I10 Essential (primary) hypertension: Secondary | ICD-10-CM | POA: Diagnosis not present

## 2018-08-30 DIAGNOSIS — I712 Thoracic aortic aneurysm, without rupture: Secondary | ICD-10-CM

## 2018-08-30 NOTE — Patient Instructions (Signed)

## 2018-09-20 ENCOUNTER — Other Ambulatory Visit: Payer: Self-pay | Admitting: Nurse Practitioner

## 2018-09-20 DIAGNOSIS — I7300 Raynaud's syndrome without gangrene: Secondary | ICD-10-CM

## 2018-09-21 ENCOUNTER — Telehealth: Payer: Self-pay | Admitting: Nurse Practitioner

## 2018-09-21 ENCOUNTER — Other Ambulatory Visit: Payer: Self-pay | Admitting: Nurse Practitioner

## 2018-09-21 DIAGNOSIS — I7300 Raynaud's syndrome without gangrene: Secondary | ICD-10-CM

## 2018-09-22 NOTE — Telephone Encounter (Signed)
Left patient a voicemail that he would need to be seen for refill.  Asked him to call back if he wants to come in before his scheduled follow up appointment 10/01/18.

## 2018-09-22 NOTE — Telephone Encounter (Signed)
Left patient a voicemail to ask if he is completely out of medication.  Will forward to MMM to advise on refill.

## 2018-09-22 NOTE — Telephone Encounter (Signed)
I cannotdo tramadol without being seen. Can you schedule appt tom or saturday

## 2018-10-01 ENCOUNTER — Encounter: Payer: Self-pay | Admitting: Nurse Practitioner

## 2018-10-01 ENCOUNTER — Ambulatory Visit (INDEPENDENT_AMBULATORY_CARE_PROVIDER_SITE_OTHER): Payer: Medicare HMO | Admitting: Nurse Practitioner

## 2018-10-01 VITALS — BP 124/68 | HR 76 | Temp 97.0°F | Ht 68.0 in | Wt 257.0 lb

## 2018-10-01 DIAGNOSIS — F411 Generalized anxiety disorder: Secondary | ICD-10-CM

## 2018-10-01 DIAGNOSIS — M1A09X0 Idiopathic chronic gout, multiple sites, without tophus (tophi): Secondary | ICD-10-CM | POA: Diagnosis not present

## 2018-10-01 DIAGNOSIS — I7121 Aneurysm of the ascending aorta, without rupture: Secondary | ICD-10-CM

## 2018-10-01 DIAGNOSIS — I712 Thoracic aortic aneurysm, without rupture: Secondary | ICD-10-CM

## 2018-10-01 DIAGNOSIS — E876 Hypokalemia: Secondary | ICD-10-CM

## 2018-10-01 DIAGNOSIS — M79604 Pain in right leg: Secondary | ICD-10-CM

## 2018-10-01 DIAGNOSIS — F5101 Primary insomnia: Secondary | ICD-10-CM

## 2018-10-01 DIAGNOSIS — I8393 Asymptomatic varicose veins of bilateral lower extremities: Secondary | ICD-10-CM | POA: Diagnosis not present

## 2018-10-01 DIAGNOSIS — I10 Essential (primary) hypertension: Secondary | ICD-10-CM | POA: Diagnosis not present

## 2018-10-01 DIAGNOSIS — E785 Hyperlipidemia, unspecified: Secondary | ICD-10-CM

## 2018-10-01 DIAGNOSIS — I7300 Raynaud's syndrome without gangrene: Secondary | ICD-10-CM

## 2018-10-01 DIAGNOSIS — N400 Enlarged prostate without lower urinary tract symptoms: Secondary | ICD-10-CM | POA: Diagnosis not present

## 2018-10-01 DIAGNOSIS — Z23 Encounter for immunization: Secondary | ICD-10-CM

## 2018-10-01 DIAGNOSIS — M79605 Pain in left leg: Secondary | ICD-10-CM

## 2018-10-01 LAB — CMP14+EGFR
A/G RATIO: 1.6 (ref 1.2–2.2)
ALBUMIN: 4.1 g/dL (ref 3.5–4.8)
ALK PHOS: 55 IU/L (ref 39–117)
ALT: 21 IU/L (ref 0–44)
AST: 23 IU/L (ref 0–40)
BILIRUBIN TOTAL: 0.5 mg/dL (ref 0.0–1.2)
BUN/Creatinine Ratio: 16 (ref 10–24)
BUN: 15 mg/dL (ref 8–27)
CHLORIDE: 99 mmol/L (ref 96–106)
CO2: 26 mmol/L (ref 20–29)
Calcium: 10.5 mg/dL — ABNORMAL HIGH (ref 8.6–10.2)
Creatinine, Ser: 0.96 mg/dL (ref 0.76–1.27)
GFR calc Af Amer: 89 mL/min/{1.73_m2} (ref >59)
GFR calc non Af Amer: 77 mL/min/{1.73_m2} (ref >59)
GLOBULIN, TOTAL: 2.6 g/dL (ref 1.5–4.5)
GLUCOSE: 107 mg/dL — AB (ref 65–99)
POTASSIUM: 3.9 mmol/L (ref 3.5–5.2)
SODIUM: 139 mmol/L (ref 134–144)
Total Protein: 6.7 g/dL (ref 6.0–8.5)

## 2018-10-01 LAB — LIPID PANEL
CHOLESTEROL TOTAL: 190 mg/dL (ref 100–199)
Chol/HDL Ratio: 4.8 ratio (ref 0.0–5.0)
HDL: 40 mg/dL (ref >39)
LDL Calculated: 118 mg/dL — ABNORMAL HIGH (ref 0–99)
TRIGLYCERIDES: 160 mg/dL — AB (ref 0–149)
VLDL Cholesterol Cal: 32 mg/dL (ref 5–40)

## 2018-10-01 MED ORDER — NIFEDIPINE ER OSMOTIC RELEASE 30 MG PO TB24: 30.0000 mg | ORAL_TABLET | Freq: Every day | ORAL | 1 refills | Status: DC

## 2018-10-01 MED ORDER — BENAZEPRIL HCL 20 MG PO TABS: 20.0000 mg | ORAL_TABLET | Freq: Every day | ORAL | 1 refills | Status: DC

## 2018-10-01 MED ORDER — POTASSIUM 99 MG PO TABS: 1.0000 | ORAL_TABLET | Freq: Every day | ORAL | 1 refills | Status: DC

## 2018-10-01 MED ORDER — METOPROLOL TARTRATE 25 MG PO TABS: 12.5000 mg | ORAL_TABLET | Freq: Two times a day (BID) | ORAL | 3 refills | Status: DC

## 2018-10-01 MED ORDER — ALLOPURINOL 300 MG PO TABS: 300.0000 mg | ORAL_TABLET | Freq: Every day | ORAL | 1 refills | Status: DC

## 2018-10-01 MED ORDER — TRAZODONE HCL 50 MG PO TABS: 25.0000 mg | ORAL_TABLET | Freq: Every evening | ORAL | 5 refills | Status: DC | PRN

## 2018-10-01 MED ORDER — HYDROCHLOROTHIAZIDE 25 MG PO TABS: 25.0000 mg | ORAL_TABLET | Freq: Every day | ORAL | 1 refills | Status: DC

## 2018-10-01 MED ORDER — TRAMADOL HCL 50 MG PO TABS: 50.0000 mg | ORAL_TABLET | Freq: Two times a day (BID) | ORAL | 2 refills | Status: DC

## 2018-10-01 MED ORDER — CLONAZEPAM 0.5 MG PO TABS: 0.5000 mg | ORAL_TABLET | Freq: Two times a day (BID) | ORAL | 2 refills | Status: DC | PRN

## 2018-10-01 NOTE — Addendum Note (Signed)
Addended by: Chevis Pretty on: 10/01/2018 10:48 AM   Modules accepted: Orders

## 2018-10-01 NOTE — Patient Instructions (Signed)

## 2018-10-01 NOTE — Progress Notes (Signed)
Subjective:    Patient ID: Joseph [name], male    DOB: 1941/06/10, 77 y.o.   MRN: [id number]   Chief Complaint: medical management of chronic issues  HPI:  1. Essential hypertension, benign  No c/o chest pain, sob or headache. Does not check blood pressure at home. BP Readings from Last 3 Encounters:  08/30/18 118/76  08/03/18 125/78  06/29/18 111/63     2. Raynaud's disease without gangrene  Does ok in the spring , summer and fall. It is winter time when his hands and toes bother him the most.  3. Thoracic ascending aortic aneurysm ([place])  Has 4.8cm acending aortic aneurysm and it is suppose to be checked semi annually  4. Varicose veins of both lower extremities, unspecified whether complicated  He says that legs feel heavy at times but do not really hurt  5. Benign prostatic hyperplasia without lower urinary tract symptoms Sees urologist yearly. Last PSA was 3.0  6. Hyperlipidemia with target LDL less than 100  Does not watch diet at all and does very little exercise  7. Primary insomnia  takes trazadone to sleep at night. Says he is sleeping well  8. Idiopathic chronic gout of multiple sites without tophus  He denies any recent flare ups  9. Severe obesity (BMI >= 40) (HCC)  No recent weight changes  10. Hypokalemia  denies any lower ext cramping  11.    Leg pain          Uses ultram mainly at night because of leg pain. He has been taking           for awhile.      Outpatient Encounter Medications as of 10/01/2018  Medication Sig  . allopurinol (ZYLOPRIM) 300 MG tablet Take 1 tablet (300 mg total) by mouth daily.  . benazepril (LOTENSIN) 20 MG tablet Take 1 tablet (20 mg total) by mouth daily.  . clonazePAM (KLONOPIN) 0.5 MG tablet Take 1 tablet (0.5 mg total) by mouth 2 (two) times daily as needed.  . fish oil-omega-3 fatty acids 1000 MG capsule Take 2 g by mouth daily.  . hydrochlorothiazide (HYDRODIURIL) 25 MG tablet Take 1 tablet (25 mg total) by mouth daily.  .  meclizine (ANTIVERT) 25 MG tablet TAKE 1/2 TO 1 (ONE-HALF TO ONE) TABLET BY MOUTH THREE TIMES DAILY AS NEEDED FOR DIZZINESS  . Melatonin 5 MG TABS Take 5 mg by mouth at bedtime.  . metoprolol tartrate (LOPRESSOR) 25 MG tablet Take 0.5 tablets (12.5 mg total) by mouth 2 (two) times daily.  . Multiple Vitamins-Minerals (COMPLETE MULTIVITAMIN/MINERAL PO) Take 1 tablet by mouth daily.  Marland Kitchen NIFEdipine (PROCARDIA-XL/ADALAT-CC/NIFEDICAL-XL) 30 MG 24 hr tablet Take 1 tablet (30 mg total) by mouth daily.  . Potassium 99 MG TABS Take 1 tablet (99 mg total) by mouth daily.  . traZODone (DESYREL) 50 MG tablet Take 0.5-1 tablets (25-50 mg total) by mouth at bedtime as needed. for sleep     New complaints: None today  Social history: Wife died earlier his year and he says that his kids do not come see him very much.    Review of Systems  Constitutional: Negative for activity change and appetite change.  HENT: Negative.   Eyes: Negative for pain.  Respiratory: Negative for shortness of breath.   Cardiovascular: Negative for chest pain, palpitations and leg swelling.  Gastrointestinal: Negative for abdominal pain.  Endocrine: Negative for polydipsia.  Genitourinary: Negative.   Skin: Negative for rash.  Neurological: Negative for dizziness,  weakness and headaches.  Hematological: Does not bruise/bleed easily.  Psychiatric/Behavioral: Negative.   All other systems reviewed and are negative.      Objective:   Physical Exam  Constitutional: He is oriented to person, place, and time. He appears well-developed and well-nourished.  HENT:  Head: Normocephalic.  Nose: Nose normal.  Mouth/Throat: Oropharynx is clear and moist.  Eyes: Pupils are equal, round, and reactive to light. EOM are normal.  Neck: Normal range of motion and phonation normal. Neck supple. No JVD present. Carotid bruit is not present. No thyroid mass and no thyromegaly present.  Cardiovascular: Normal rate and regular rhythm.    Pulmonary/Chest: Effort normal and breath sounds normal. No respiratory distress.  Abdominal: Soft. Normal appearance, normal aorta and bowel sounds are normal. There is no tenderness.  Musculoskeletal: Normal range of motion. He exhibits edema (1+ edema bil lower ext).  Lymphadenopathy:    He has no cervical adenopathy.  Neurological: He is alert and oriented to person, place, and time.  Skin: Skin is warm and dry.  Psychiatric: He has a normal mood and affect. His behavior is normal. Judgment and thought content normal.  Nursing note and vitals reviewed.   BP 124/68   Pulse 76   Temp (!) 97 F (36.1 C) (Oral)   Ht 5\' 8"  (1.727 m)   Wt 257 lb (116.6 kg)   BMI 39.08 kg/m        Assessment & Plan:  Joseph [name] comes in today with chief complaint of Medical Management of Chronic Issues   Diagnosis and orders addressed:  1. Essential hypertension, benign Low sodium diet - hydrochlorothiazide (HYDRODIURIL) 25 MG tablet; Take 1 tablet (25 mg total) by mouth daily.  Dispense: 90 tablet; Refill: 1 - benazepril (LOTENSIN) 20 MG tablet; Take 1 tablet (20 mg total) by mouth daily.  Dispense: 90 tablet; Refill: 1 - metoprolol tartrate (LOPRESSOR) 25 MG tablet; Take 0.5 tablets (12.5 mg total) by mouth 2 (two) times daily.  Dispense: 90 tablet; Refill: 3  2. Raynaud's disease without gangrene Keep hands warm  3. Thoracic ascending aortic aneurysm [facility]) Sent message to cardiology to see if wants himto have ct angio this  month - NIFEdipine (PROCARDIA-XL/NIFEDICAL-XL) 30 MG 24 hr tablet; Take 1 tablet (30 mg total) by mouth daily.  Dispense: 90 tablet; Refill: 1  4. Varicose veins of both lower extremities, unspecified whether complicated Wear compression hose when up walking around  5. Benign prostatic hyperplasia without lower urinary tract symptoms   6. Hyperlipidemia with target LDL less than 100 Low fat diet  7. Primary insomnia Bedtime routine - traZODone (DESYREL) 50  MG tablet; Take 0.5-1 tablets (25-50 mg total) by mouth at bedtime as needed. for sleep  Dispense: 30 tablet; Refill: 5  8. Idiopathic chronic gout of multiple sites without tophus - allopurinol (ZYLOPRIM) 300 MG tablet; Take 1 tablet (300 mg total) by mouth daily.  Dispense: 90 tablet; Refill: 1  9. Severe obesity (BMI >= 40) (HCC) Discussed diet and exercise for person with BMI >25 Will recheck weight in 3-6 months  10. Hypokalemia - Potassium 99 MG TABS; Take 1 tablet (99 mg total) by mouth daily.  Dispense: 330 each; Refill: 1  11. Pain in both lower extremities  - traMADol (ULTRAM) 50 MG tablet; Take 1 tablet (50 mg total) by mouth 2 (two) times daily.  Dispense: 60 tablet; Refill: 2  12. GAD (generalized anxiety disorder) Stress management - clonazePAM (KLONOPIN) 0.5 MG tablet; Take 1  tablet (0.5 mg total) by mouth 2 (two) times daily as needed.  Dispense: 60 tablet; Refill: 2   Labs pending Health Maintenance reviewed Diet and exercise encouraged  Follow up plan: 3 months   Mary-Margaret Hassell Done, FNP

## 2018-10-05 MED ORDER — ATORVASTATIN CALCIUM 40 MG PO TABS: 40.0000 mg | ORAL_TABLET | Freq: Every day | ORAL | 1 refills | Status: DC

## 2018-10-05 NOTE — Addendum Note (Signed)
Addended by: Chevis Pretty on: 10/05/2018 01:17 PM   Modules accepted: Orders

## 2018-10-18 ENCOUNTER — Telehealth: Payer: Self-pay | Admitting: Nurse Practitioner

## 2018-10-18 DIAGNOSIS — R3915 Urgency of urination: Secondary | ICD-10-CM

## 2018-10-18 NOTE — Telephone Encounter (Signed)
forwarding

## 2018-10-18 NOTE — Telephone Encounter (Signed)
PT is wanting to be referred to dr Jimmy Footman in [place] states he is having some prostate issues.

## 2018-10-18 NOTE — Telephone Encounter (Signed)
Left message to call back  

## 2018-10-19 ENCOUNTER — Telehealth: Payer: Self-pay | Admitting: Nurse Practitioner

## 2018-10-19 NOTE — Telephone Encounter (Signed)
lmtcb

## 2018-10-19 NOTE — Telephone Encounter (Signed)
Refer to previous phone note 

## 2018-10-19 NOTE — Telephone Encounter (Signed)
Aware referral was placed yesterday and we will contact him once apt was made- patient verbalizes understanding.

## 2018-12-03 ENCOUNTER — Ambulatory Visit: Payer: Medicare HMO | Admitting: Urology

## 2018-12-03 DIAGNOSIS — N5201 Erectile dysfunction due to arterial insufficiency: Secondary | ICD-10-CM

## 2018-12-03 DIAGNOSIS — R361 Hematospermia: Secondary | ICD-10-CM | POA: Diagnosis not present

## 2019-01-04 ENCOUNTER — Ambulatory Visit (INDEPENDENT_AMBULATORY_CARE_PROVIDER_SITE_OTHER): Payer: Medicare HMO | Admitting: Nurse Practitioner

## 2019-01-04 ENCOUNTER — Encounter: Payer: Self-pay | Admitting: Nurse Practitioner

## 2019-01-04 VITALS — BP 123/71 | HR 63 | Temp 97.1°F | Ht 68.0 in | Wt 253.0 lb

## 2019-01-04 DIAGNOSIS — I7300 Raynaud's syndrome without gangrene: Secondary | ICD-10-CM

## 2019-01-04 DIAGNOSIS — I8393 Asymptomatic varicose veins of bilateral lower extremities: Secondary | ICD-10-CM | POA: Diagnosis not present

## 2019-01-04 DIAGNOSIS — E785 Hyperlipidemia, unspecified: Secondary | ICD-10-CM

## 2019-01-04 DIAGNOSIS — E876 Hypokalemia: Secondary | ICD-10-CM

## 2019-01-04 DIAGNOSIS — I712 Thoracic aortic aneurysm, without rupture: Secondary | ICD-10-CM

## 2019-01-04 DIAGNOSIS — R251 Tremor, unspecified: Secondary | ICD-10-CM

## 2019-01-04 DIAGNOSIS — I10 Essential (primary) hypertension: Secondary | ICD-10-CM | POA: Diagnosis not present

## 2019-01-04 DIAGNOSIS — F411 Generalized anxiety disorder: Secondary | ICD-10-CM

## 2019-01-04 DIAGNOSIS — N400 Enlarged prostate without lower urinary tract symptoms: Secondary | ICD-10-CM | POA: Diagnosis not present

## 2019-01-04 DIAGNOSIS — F5101 Primary insomnia: Secondary | ICD-10-CM

## 2019-01-04 DIAGNOSIS — M79605 Pain in left leg: Secondary | ICD-10-CM

## 2019-01-04 DIAGNOSIS — M79604 Pain in right leg: Secondary | ICD-10-CM

## 2019-01-04 DIAGNOSIS — M1A09X0 Idiopathic chronic gout, multiple sites, without tophus (tophi): Secondary | ICD-10-CM

## 2019-01-04 DIAGNOSIS — I7121 Aneurysm of the ascending aorta, without rupture: Secondary | ICD-10-CM

## 2019-01-04 MED ORDER — CLONAZEPAM 0.5 MG PO TABS: 0.5000 mg | ORAL_TABLET | Freq: Two times a day (BID) | ORAL | 2 refills | Status: DC | PRN

## 2019-01-04 MED ORDER — ATORVASTATIN CALCIUM 40 MG PO TABS: 40.0000 mg | ORAL_TABLET | Freq: Every day | ORAL | 1 refills | Status: DC

## 2019-01-04 MED ORDER — ALLOPURINOL 300 MG PO TABS: 300.0000 mg | ORAL_TABLET | Freq: Every day | ORAL | 1 refills | Status: DC

## 2019-01-04 MED ORDER — BENAZEPRIL HCL 20 MG PO TABS: 20.0000 mg | ORAL_TABLET | Freq: Every day | ORAL | 1 refills | Status: DC

## 2019-01-04 MED ORDER — HYDROCHLOROTHIAZIDE 25 MG PO TABS: 25.0000 mg | ORAL_TABLET | Freq: Every day | ORAL | 1 refills | Status: DC

## 2019-01-04 MED ORDER — METOPROLOL TARTRATE 25 MG PO TABS: 12.5000 mg | ORAL_TABLET | Freq: Two times a day (BID) | ORAL | 3 refills | Status: DC

## 2019-01-04 MED ORDER — TRAMADOL HCL 50 MG PO TABS: 50.0000 mg | ORAL_TABLET | Freq: Two times a day (BID) | ORAL | 2 refills | Status: AC

## 2019-01-04 MED ORDER — POTASSIUM 99 MG PO TABS: 1.0000 | ORAL_TABLET | Freq: Every day | ORAL | 1 refills | Status: DC

## 2019-01-04 MED ORDER — NIFEDIPINE ER OSMOTIC RELEASE 30 MG PO TB24: 30.0000 mg | ORAL_TABLET | Freq: Every day | ORAL | 1 refills | Status: DC

## 2019-01-04 MED ORDER — TRAZODONE HCL 50 MG PO TABS: 25.0000 mg | ORAL_TABLET | Freq: Every evening | ORAL | 5 refills | Status: DC | PRN

## 2019-01-04 NOTE — Progress Notes (Signed)
Subjective:    Patient ID: Joseph [name], male    DOB: Jun 23, 1941, 78 y.o.   MRN: [id number]   Chief Complaint: Medical Management of Chronic Issues   HPI:  1. Essential hypertension, benign  No c/o chest pain, sob or headache. Does not check blood pressure at home. BP Readings from Last 3 Encounters:  10/01/18 124/68  08/30/18 118/76  08/03/18 125/78     2. Primary insomnia  He tales trazadone to sleep at night. Says he rest well.  3. Hypokalemia  No c/o lower ext cramping  4. Hyperlipidemia with target LDL less than 100  Doe snot wtach diet. He eats out a lot since his wife passed away.  5. Severe obesity (BMI >= 40) (HCC)  No significant weight changes since last visit  6. Thoracic ascending aortic aneurysm ([place])  Last CT was done 04/15/18 and aorta was stable.  7. Varicose veins of both lower extremities, unspecified whether complicated Has bad varicose veins. Says makes legs feel heavy but he keeps on going. Legs hurt at night Pain assessment: Cause of pain- varicose veins Pain location- bil lower legs Pain on scale of 1-10- 2/10 Frequency- daily- worse at night What increases pain-lots of walking What makes pain Better-resting Effects on ADL - none Any change in general medical condition-none  Current medications- ultram 50mg  bid- some days only takes 1 time Effectiveness of current meds-none Adverse reactions form pain meds-none Morphine equivalent- 10  Pill count performed-No Urine drug screen- No Was the [facility] reviewed- yes  If yes were their any concerning findings? - none  Pain contract signed on:01/04/2019   8. Raynaud's disease without gangrene  Has been bad since it has been cold outside. Wearing gloves helps  9. Benign prostatic hyperplasia without lower urinary tract symptoms Denies any trouble voiding   10. Idiopathic chronic gout of multiple sites without tophus No recent gout flare ups.     Outpatient Encounter Medications as of 01/04/2019    Medication Sig  . allopurinol (ZYLOPRIM) 300 MG tablet Take 1 tablet (300 mg total) by mouth daily.  Marland Kitchen atorvastatin (LIPITOR) 40 MG tablet Take 1 tablet (40 mg total) by mouth daily.  . benazepril (LOTENSIN) 20 MG tablet Take 1 tablet (20 mg total) by mouth daily.  . clonazePAM (KLONOPIN) 0.5 MG tablet Take 1 tablet (0.5 mg total) by mouth 2 (two) times daily as needed.  . fish oil-omega-3 fatty acids 1000 MG capsule Take 2 g by mouth daily.  . hydrochlorothiazide (HYDRODIURIL) 25 MG tablet Take 1 tablet (25 mg total) by mouth daily.  . meclizine (ANTIVERT) 25 MG tablet TAKE 1/2 TO 1 (ONE-HALF TO ONE) TABLET BY MOUTH THREE TIMES DAILY AS NEEDED FOR DIZZINESS  . Melatonin 5 MG TABS Take 5 mg by mouth at bedtime.  . metoprolol tartrate (LOPRESSOR) 25 MG tablet Take 0.5 tablets (12.5 mg total) by mouth 2 (two) times daily.  . Multiple Vitamins-Minerals (COMPLETE MULTIVITAMIN/MINERAL PO) Take 1 tablet by mouth daily.  Marland Kitchen NIFEdipine (PROCARDIA-XL/NIFEDICAL-XL) 30 MG 24 hr tablet Take 1 tablet (30 mg total) by mouth daily.  . Potassium 99 MG TABS Take 1 tablet (99 mg total) by mouth daily.  . sildenafil (REVATIO) 20 MG tablet TAKE 1 TO 5 TABLETS BY MOUTH AS NEEDED  . traZODone (DESYREL) 50 MG tablet Take 0.5-1 tablets (25-50 mg total) by mouth at bedtime as needed. for sleep       New complaints: Patient says he has developed hand tremors. Started several  months ago.  Social history: Lives alone since wife died. He says he has been seeing someone lately.   Review of Systems  Constitutional: Negative for activity change and appetite change.  HENT: Negative.   Eyes: Negative for pain.  Respiratory: Negative for shortness of breath.   Cardiovascular: Negative for chest pain, palpitations and leg swelling.  Gastrointestinal: Negative for abdominal pain.  Endocrine: Negative for polydipsia.  Genitourinary: Negative.   Skin: Negative for rash.  Neurological: Negative for dizziness, weakness  and headaches.  Hematological: Does not bruise/bleed easily.  Psychiatric/Behavioral: Negative.   All other systems reviewed and are negative.      Objective:   Physical Exam Vitals signs and nursing note reviewed.  Constitutional:      Appearance: Normal appearance. He is well-developed.  HENT:     Head: Normocephalic.     Nose: Nose normal.  Eyes:     Pupils: Pupils are equal, round, and reactive to light.  Neck:     Musculoskeletal: Normal range of motion and neck supple.     Thyroid: No thyroid mass or thyromegaly.     Vascular: No carotid bruit or JVD.     Trachea: Phonation normal.  Cardiovascular:     Rate and Rhythm: Normal rate and regular rhythm.  Pulmonary:     Effort: Pulmonary effort is normal. No respiratory distress.     Breath sounds: Normal breath sounds.  Abdominal:     General: Bowel sounds are normal.     Palpations: Abdomen is soft.     Tenderness: There is no abdominal tenderness.  Musculoskeletal: Normal range of motion.  Lymphadenopathy:     Cervical: No cervical adenopathy.  Skin:    General: Skin is warm and dry.  Neurological:     Mental Status: He is alert and oriented to person, place, and time.  Psychiatric:        Behavior: Behavior normal.        Thought Content: Thought content normal.        Judgment: Judgment normal.    BP 123/71   Pulse 63   Temp (!) 97.1 F (36.2 C) (Oral)   Ht 5\' 8"  (1.727 m)   Wt 253 lb (114.8 kg)   BMI 38.47 kg/m         Assessment & Plan:  Joseph [name] comes in today with chief complaint of Medical Management of Chronic Issues   Diagnosis and orders addressed:  1. Essential hypertension, benign Low sodium diet - hydrochlorothiazide (HYDRODIURIL) 25 MG tablet; Take 1 tablet (25 mg total) by mouth daily.  Dispense: 90 tablet; Refill: 1 - benazepril (LOTENSIN) 20 MG tablet; Take 1 tablet (20 mg total) by mouth daily.  Dispense: 90 tablet; Refill: 1 - metoprolol tartrate (LOPRESSOR) 25 MG tablet;  Take 0.5 tablets (12.5 mg total) by mouth 2 (two) times daily.  Dispense: 90 tablet; Refill: 3  2. Primary insomnia Bedtime routine - traZODone (DESYREL) 50 MG tablet; Take 0.5-1 tablets (25-50 mg total) by mouth at bedtime as needed. for sleep  Dispense: 30 tablet; Refill: 5  3. Hypokalemia - Potassium 99 MG TABS; Take 1 tablet (99 mg total) by mouth daily.  Dispense: 330 each; Refill: 1  4. Hyperlipidemia with target LDL less than 100 Low fat diet - atorvastatin (LIPITOR) 40 MG tablet; Take 1 tablet (40 mg total) by mouth daily.  Dispense: 90 tablet; Refill: 1  5. Severe obesity (BMI >= 40) (HCC) Discussed diet and exercise for person with  BMI >25 Will recheck weight in 3-6 months  6. Thoracic ascending aortic aneurysm ([place]) Will recheck in 6 months - NIFEdipine (PROCARDIA-XL/NIFEDICAL-XL) 30 MG 24 hr tablet; Take 1 tablet (30 mg total) by mouth daily.  Dispense: 90 tablet; Refill: 1  7. Varicose veins of both lower extremities, unspecified whether complicated Wear compression socks  8. Raynaud's disease without gangrene Keep hands warm  9. Benign prostatic hyperplasia without lower urinary tract symptoms Keep follow up appointments with urology  10. Idiopathic chronic gout of multiple sites without tophus Low purine diet - allopurinol (ZYLOPRIM) 300 MG tablet; Take 1 tablet (300 mg total) by mouth daily.  Dispense: 90 tablet; Refill: 1  11. Tremor of both hands Ref to neurology - Ambulatory referral to Neurology  12. Pain in both lower extremities - traMADol (ULTRAM) 50 MG tablet; Take 1 tablet (50 mg total) by mouth 2 (two) times daily for 30 days.  Dispense: 60 tablet; Refill: 2  13. GAD (generalized anxiety disorder) Stress management - clonazePAM (KLONOPIN) 0.5 MG tablet; Take 1 tablet (0.5 mg total) by mouth 2 (two) times daily as needed.  Dispense: 60 tablet; Refill: 2   Labs pending Health Maintenance reviewed Diet and exercise encouraged  Follow up  plan: 3 months   Mary-Margaret Hassell Done, FNP

## 2019-01-04 NOTE — Addendum Note (Signed)
Addended by: Chevis Pretty on: 01/04/2019 11:13 AM   Modules accepted: Orders

## 2019-01-04 NOTE — Patient Instructions (Signed)
Exercise for Older Adults Staying physically active is important as you age. The four types of exercises that are best for older adults are endurance, strength, balance, and flexibility. Contact your health care provider before you start any exercise routine. Ask your health care provider what activities are safe for you. What are the risks? Risks associated with exercising include:  Overdoing it. This may lead to sore muscles or fatigue.  Falls.  Injuries.  Dehydration. How to do these exercises Endurance exercises Endurance (aerobic) exercises raise your breathing rate and heart rate. Increasing your endurance helps you to do everyday tasks and stay healthy. By improving the health of your body system that includes your heart, lungs, and blood vessels (circulatory system), you may also delay or prevent diseases such as heart disease, diabetes, and bone loss (osteoporosis). Types of endurance exercises include:  Sports.  Indoor activities, such as using gym equipment, doing water aerobics, or dancing.  Outdoor activities, such as biking or jogging.  Tasks around the house, such as gardening, yard work, and heavy household chores like cleaning.  Walking, such as hiking or walking around your neighborhood. When doing endurance exercises, make sure you:  Are aware of your surroundings.  Use safety equipment as directed.  Dress in layers when exercising outdoors.  Drink plenty of water to stay well hydrated. Build up endurance slowly. Start with 10 minutes at a time, and gradually build up to doing 30 minutes at a time. Unless your health care provider gave you different instructions, aim to exercise for a total of 150 minutes a week. Spread out that time so you are working on endurance on 3 or more days a week. Strength exercises Lifting, pulling, or pushing weights helps to strengthen muscles. Having stronger muscles makes it easier to do everyday activities, such as getting up  from a chair, climbing stairs, carrying groceries, and playing with grandchildren. Strength exercises include arm and leg exercises that may be done:  With weights.  Without weights (using your own body weight).  With a resistance band. When doing strength exercises:  Move smoothly and steadily. Do not suddenly thrust or jerk the weights, the resistance band, or your body.  Start with no weights or with light weights, and gradually add more weight over time. Eventually, aim to use weights that are hard or very hard for you to lift. This means that you are able to do 8 repetitions with the weight, and the last few repetitions are very challenging.  Lift or push weights into position for 3 seconds, hold the position for 1 second, and then take 3 seconds to return to your starting position.  Breathe out (exhale) during difficult movements, like lifting or pushing weights. Breathe in (inhale) to relax your muscles before the next repetition.  Consider alternating arms or legs, especially when you first start strength exercises.  Expect some slight muscle soreness after each session. Do strength exercises on 2 or more days a week, for 30 minutes at a time. Avoid exercising the same muscle groups two days in a row. For example, if you work on your leg muscles one day, work on your arm muscles the next day. When you can do two sets of 10-15 repetitions with a certain weight, increase the amount of weight. Balance Balance exercises can help to prevent falls. Balance exercises include:  Standing on one foot.  Heel-to-toe walk.  Balance walk.  Tai chi. Make sure you have something sturdy to hold onto while doing balance  Balance exercises can help to prevent falls. Balance exercises include:   Standing on one foot.   Heel-to-toe walk.   Balance walk.   Tai chi.  Make sure you have something sturdy to hold onto while doing balance exercises, such as a sturdy chair. As your balance improves, challenge yourself by holding onto the chair with one hand instead of two, and then with no hands. Trying exercises with your eyes closed also challenges your balance, but be sure to have a sturdy surface (like a  countertop) close by in case you need it.  Do balance exercises as often as you want, or as often as directed by your health care provider. Strength exercises for the lower body also help to improve balance.  Flexibility  Flexibility exercises improve how far you can bend, straighten, move, or rotate parts of your body (range of motion). These exercises also help you to do everyday activities such as getting dressed or reaching for objects. Flexibility exercises include stretching different parts of the body, and they may be done in a standing or seated position or on the floor.  When stretching, make sure you:   Keep a slight bend in your arms and legs. Avoid completely straightening ("locking") your joints.   Do not stretch so far that you feel pain. You should feel a mild stretching feeling. You may try stretching farther as you become more flexible over time.   Relax and breathe between stretches.   Hold onto something sturdy for balance as needed.  Hold each stretch for 10-30 seconds. Repeat each stretch 3-5 times.  General safety tips   Exercise in well-lit areas.   Do not hold your breath during exercises or stretches.   Warm up before exercising, and cool down after exercising. This can help prevent injury.   Drink plenty of water during exercise or any activity that makes you sweat.   Use smooth, steady movements. Do not use sudden, jerking movements, especially when lifting weights or doing flexibility exercises.   If you are not sure if an exercise is safe for you, or you are not sure how to do an exercise, talk with your health care provider. This is especially important if you have had surgery on muscles, bones, or joints (orthopedic surgery).  Where to find more information  You can find more information about exercise for older adults from:   Your local health department, fitness center, or community center. These facilities may have programs for aging adults.   National Institute on  Aging: www.nia.nih.gov   National Council on Aging: www.ncoa.org  Summary   Staying physically active is important as you age.   Make sure to contact your health care provider before you start any exercise routine. Ask your health care provider what activities are safe for you.   Doing endurance, strength, balance, and flexibility exercises can help to delay or prevent certain diseases, such as heart disease, diabetes, and bone loss (osteoporosis).  This information is not intended to replace advice given to you by your health care provider. Make sure you discuss any questions you have with your health care provider.  Document Released: 04/08/2017 Document Revised: 04/08/2017 Document Reviewed: 04/08/2017  [employer]vier Interactive Patient Education  2019 [employer]

## 2019-01-05 LAB — LIPID PANEL
Chol/HDL Ratio: 4.9 ratio (ref 0.0–5.0)
Cholesterol, Total: 204 mg/dL — ABNORMAL HIGH (ref 100–199)
HDL: 42 mg/dL (ref >39)
LDL CALC: 139 mg/dL — AB (ref 0–99)
Triglycerides: 116 mg/dL (ref 0–149)
VLDL CHOLESTEROL CAL: 23 mg/dL (ref 5–40)

## 2019-01-05 LAB — CMP14+EGFR
ALT: 21 IU/L (ref 0–44)
AST: 26 IU/L (ref 0–40)
Albumin/Globulin Ratio: 1.6 (ref 1.2–2.2)
Albumin: 4.1 g/dL (ref 3.7–4.7)
Alkaline Phosphatase: 63 IU/L (ref 39–117)
BUN/Creatinine Ratio: 15 (ref 10–24)
BUN: 17 mg/dL (ref 8–27)
Bilirubin Total: 0.4 mg/dL (ref 0.0–1.2)
CO2: 23 mmol/L (ref 20–29)
Calcium: 9.8 mg/dL (ref 8.6–10.2)
Chloride: 101 mmol/L (ref 96–106)
Creatinine, Ser: 1.1 mg/dL (ref 0.76–1.27)
GFR calc non Af Amer: 65 mL/min/{1.73_m2} (ref >59)
GFR, EST AFRICAN AMERICAN: 75 mL/min/{1.73_m2} (ref >59)
GLUCOSE: 90 mg/dL (ref 65–99)
Globulin, Total: 2.6 g/dL (ref 1.5–4.5)
Potassium: 4.5 mmol/L (ref 3.5–5.2)
Sodium: 142 mmol/L (ref 134–144)
TOTAL PROTEIN: 6.7 g/dL (ref 6.0–8.5)

## 2019-01-09 ENCOUNTER — Other Ambulatory Visit: Payer: Self-pay | Admitting: Nurse Practitioner

## 2019-02-17 ENCOUNTER — Ambulatory Visit: Payer: Medicare HMO | Admitting: Neurology

## 2019-02-17 ENCOUNTER — Other Ambulatory Visit: Payer: Self-pay

## 2019-02-17 ENCOUNTER — Encounter: Payer: Self-pay | Admitting: Neurology

## 2019-02-17 VITALS — BP 118/65 | HR 82 | Temp 98.7°F | Ht 68.0 in | Wt 252.5 lb

## 2019-02-17 DIAGNOSIS — G629 Polyneuropathy, unspecified: Secondary | ICD-10-CM | POA: Diagnosis not present

## 2019-02-17 DIAGNOSIS — G252 Other specified forms of tremor: Secondary | ICD-10-CM | POA: Diagnosis not present

## 2019-02-17 DIAGNOSIS — R269 Unspecified abnormalities of gait and mobility: Secondary | ICD-10-CM | POA: Diagnosis not present

## 2019-02-17 NOTE — Progress Notes (Signed)
Subjective:    Patient ID: Joseph [name] is a 78 y.o. male.  HPI      Joseph Age, MD, PhD [facility] [address] [place], [place] [address]  Dear Mary-Margaret,  I saw your patient, Joseph [name] upon your kind request in my neurologic clinic today for initial consultation of his tremors. The patient is unaccompanied today. As you know, Joseph [name] is a 78 year old right-handed gentleman with an underlying medical history of hypertension, ascending aortic aneurysm, anxiety, reflux disease, gout, kidney stones, hypokalemia, BPH, Raynaud's disease, insomnia, and obesity, who reports a bilateral hand tremor for the past years, approximately 2-4 years. I reviewed your office note from 01/04/2019. He feels that the tremor has become worse. He has fallen. He has balance issues and a history of vertigo last year. He has been taking meclizine as needed. Of note, he does take several potentially sedating medications including meclizine when necessary, clonazepam 0.5 mg strength half a pill twice a day, trazodone 50 mg strength at night, in addition he takes an over-the-counter sleep aid called jet asleep, which contains diphenhydramine 50 mg strength and he takes 2 at night and sometimes an additional 2 in the middle of the night. When he first wakes up in the morning he is unsteady, he has to get his bearings first. He has had numbness and tingling in his feet. He has at times painful sensations in both feet particularly in the toes. He was recently given a prescription for nifedipine as I understand. He does not sleep very well. He snores. He's never had a sleep study. He is reluctant to pursue a sleep study. He endorses significant stress. He denies any family history of Parkinson's disease or tremors. He is the youngest of a total of 8 siblings, none of his 7 siblings had tremors. He is the only surviving sibling. He has 2 daughters, one lives across the street and  another in [place]. His wife was sick for an extended period of time. She died approximately 2 years ago in May. He shares pictures of her and her gravestone and pictures of his daughters and their families (pics from his wallet). He has been with a girlfriend for the past nearly 2 years. He met her many years ago, she was in school with his wife in the past, she stays with some of the time. She lost her husband and he lost his wife and they reconnected. He does not drink alcohol, he is a nonsmoker, he drinks caffeine in limitation, tries to hydrate well with water but admits he has severe mouth dryness. He does not use a walker or cane.  His Past Medical History Is Significant For: Past Medical History:  Diagnosis Date  . Anxiety   . Ascending aortic aneurysm ([place])   . Essential hypertension   . GERD (gastroesophageal reflux disease)   . Gout   . History of kidney stones   . Insomnia     His Past Surgical History Is Significant For: Past Surgical History:  Procedure Laterality Date  . CARDIAC CATHETERIZATION N/A 06/27/2015   Procedure: Left Heart Cath and Coronary Angiography;  Surgeon: Troy Sine, MD;  Location: [facility];  Service: Cardiovascular;  Laterality: N/A;  . HERNIA REPAIR    . KIDNEY STONE SURGERY      His Family History Is Significant For: Family History  Problem Relation [name] of Onset  . Heart disease Father   . Heart attack  Father   . Diabetes Sister   . Heart disease Brother   . Deep vein thrombosis Brother   . Congestive Heart Failure Brother   . Cancer Brother   . Leukemia Brother   . Heart attack Brother     His Social History Is Significant For: Social History   Socioeconomic History  . Marital status: Widowed    Spouse name: Not on file  . Number of children: 2  . Years of education: 10  . Highest education level: 10th grade  Occupational History  . 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 Financial resource strain: Not hard at all  . Food insecurity:    Worry: Never true    Inability: Never true  . Transportation needs:    Medical: No    Non-medical: No  Tobacco Use  . Smoking status: Former Smoker    Packs/day: 0.50    Years: 1.00    Pack years: 0.50    Types: Cigarettes    Last attempt to quit: 03/22/1958    Years since quitting: 60.9  . Smokeless tobacco: Former Systems developer    Types: Chew  . Tobacco comment: QUIT CHEWING IN 1989  Substance and Sexual Activity  . Alcohol use: No    Alcohol/week: 0.0 standard drinks  . Drug use: No  . Sexual activity: Not Currently  Lifestyle  . Physical activity:    Days per week: 5 days    Minutes per session: 60 min  . Stress: Only a little  Relationships  . Social connections:    Talks on phone: More than three times a week    Gets together: More than three times a week    Attends religious service: More than 4 times per year    Active member of club or organization: Yes    Attends meetings of clubs or organizations: More than 4 times per year    Relationship status: Widowed  Other Topics Concern  . Not on file  Social History Narrative      Right handed    Caffeine use: none    His Allergies Are:  Allergies  Allergen Reactions  . Carafate [Sucralfate] Other (See Comments)    TOOK SKIN FROM HIS MOUTH  . Ciprofloxacin Hcl Other (See Comments)    CAN'T REMEMBER  . Prevacid [Lansoprazole] Other (See Comments)    MADE HIM FEEL BAD  . Sulfa Antibiotics Diarrhea    SEVERE  :   His Current Medications Are:  Outpatient Encounter Medications as of 02/17/2019  Medication Sig  . allopurinol (ZYLOPRIM) 300 MG tablet Take 1 tablet (300 mg total) by mouth daily.  Marland Kitchen atorvastatin (LIPITOR) 40 MG tablet Take 1 tablet (40 mg total) by mouth daily.  . benazepril (LOTENSIN) 20 MG tablet Take 1 tablet (20 mg total) by mouth daily.  . clonazePAM (KLONOPIN) 0.5 MG tablet Take 1 tablet (0.5 mg total) by mouth 2 (two) times  daily as needed.  . fish oil-omega-3 fatty acids 1000 MG capsule Take 2 g by mouth daily.  . hydrochlorothiazide (HYDRODIURIL) 25 MG tablet Take 1 tablet (25 mg total) by mouth daily.  . meclizine (ANTIVERT) 25 MG tablet TAKE 1/2 TO 1 (ONE-HALF TO ONE) TABLET BY MOUTH THREE TIMES DAILY AS NEEDED FOR DIZZINESS  . Melatonin 5 MG TABS Take 5 mg by mouth at bedtime.  . metoprolol tartrate (LOPRESSOR) 25 MG tablet Take 0.5 tablets (12.5 mg total) by  mouth 2 (two) times daily.  . Multiple Vitamins-Minerals (COMPLETE MULTIVITAMIN/MINERAL PO) Take 1 tablet by mouth daily.  Marland Kitchen NIFEdipine (PROCARDIA-XL/NIFEDICAL-XL) 30 MG 24 hr tablet Take 1 tablet (30 mg total) by mouth daily.  . Potassium 99 MG TABS Take 1 tablet (99 mg total) by mouth daily.  . sildenafil (REVATIO) 20 MG tablet TAKE 1 TO 5 TABLETS BY MOUTH AS NEEDED  . traZODone (DESYREL) 50 MG tablet Take 0.5-1 tablets (25-50 mg total) by mouth at bedtime as needed. for sleep   No facility-administered encounter medications on file as of 02/17/2019.   :   Review of Systems:  Out of a complete 14 point review of systems, all are reviewed and negative with the exception of these symptoms as listed below:  Review of Systems  Neurological:       RM 2, alone. Referral for hand tremors from Chevis Pretty, FNP (PCP). Has fallen a few times in the last year. Very unsteady gait.   Also reports hand numbness. Tremors started in the past 1-2 years. Wife passed away about a year and a half ago. Friend brought him to appt.     Objective:  Neurological Exam  Physical Exam Physical Examination:   Vitals:   02/17/19 1415  BP: 118/65  Pulse: 82  Temp: 98.7 F (37.1 C)  SpO2: 95%    General Examination: The patient is a very pleasant 78 y.o. male in no acute distress. He appears well-developed and well-nourished and well groomed. Seems stressed out, takes longer breaths, sighs, shakes his head. Visibly emotional when talking about his diseased  wife of over 19 years.   HEENT: Normocephalic, atraumatic, pupils are equal, round and reactive to lightextraocular tracking is fairly well-preserved, no lip, neck or jaw tremor, no carotid bruits. No voice tremor. He is hard of hearing. Face is symmetric, no facial masking noted. Neck circumference is 17 inches. Airway examination reveals moderate to severe mouth dryness, moderate airway crowding secondary to longer uvula and redundant soft palate. Tonsils are either absent or mild residual tonsillar tissue noticeable on the right. He thinks he had a tonsillectomy. Tongue protrudes centrally and palate elevates symmetrically.  Chest: Clear to auscultation without wheezing, rhonchi or crackles noted.  Heart: S1+S2+0, regular and normal without murmurs, rubs or gallops noted.   Abdomen: Soft, non-tender and non-distended with normal bowel sounds appreciated on auscultation.  Extremities: There is trace pitting edema in the distal lower extremities bilaterally. Pedal pulses are intact.  Skin: Warm and dry without trophic changes noted.he has varicose veins in both distal legs area and he has discoloration in both feet, purplish discoloration, spider veins.  Musculoskeletal: exam reveals no obvious joint deformities, tenderness or joint swelling or erythema.   Neurologically:  Mental status: The patient is awake, alert and oriented in all 4 spheres. His immediate and remote memory, attention, language skills and fund of knowledge are fairly appropriate. He has no difficulty with word finding. Speech is not dysarthric. Mood is slightly on the depressed side, affect is mildly blunted. Cranial nerves II - XII are as described above under HEENT exam. In addition: shoulder shrug is normal with equal shoulder height noted. Motor exam: Normal bulk, strength and tone is noted. There is no drift, resting tremor or rebound. Romberg is nontestable safely as he is unable to stand narrow based. Reflexes are 1+  in the upper extremities and diminished in the lower extremities. Toes are downgoing. Fine motor skills are globally mildly impaired.  On 02/17/2019: on  Archimedes spiral drawing he has coarse trembling with both hands, handwriting with his right hand is tremulous but legible, not micrographic. He has a bilateral upper extremity fairly coarse but fast postural tremor and to a lesser degree and action tremor, no significant intention tremor, no lower extremity tremor. Cerebellar testing: No dysmetria or intention tremor.  Sensory exam: intact to Light touch, pinprick, vibration and temperature in the upper extremities but diminished to all modalities in the lower extremities up to mid calf areas. Gait, station and balance: He stands with difficulty and pushes himself up. He stands wide-based. He has no cane or walker. He walks slightly insecurely, posture is mildly stooped, could be appropriate for [name], no shuffling, preserved arm swing is noted. He walks cautiously.        Assessment and Plan:    In summary, DEJAUN VIDRIO is a very pleasant 78 y.o.-year old male with an underlying medical history of hypertension, ascending aortic aneurysm, anxiety, reflux disease, gout, kidney stones, hypokalemia, BPH, Raynaud's disease, insomnia, and obesity, who presents for evaluation of his hand tremors. In addition, he reports balance problems, gait difficulty, numbness in his feet. On examination, he has evidence of peripheral neuropathy, he does have a gait disorder but nothing telltale for a parkinsonian gait. He has a nonspecific gait disturbance, and he does have a bilateral upper extremity tremor. He does not have a telltale history for essential tremor, no parkinsonian findings. He is reassured in that regard. He is advised that his tremor could be at least in part secondary to anxiety and stress as well as sleep deprivation. He endorses significant stress. I did not suggest any symptomatic medication for tremor  control particularly for fear of interaction and side effects especially sedating and worsening of his balance. While it is not impossible, he may have essential tremor even in the absence of a family history, none of his 7 siblings had tremors. Sleep deprivation may also play a role.  He is not sleeping very well. He may have underlying obstructive sleep apnea as he also snores. I talked to the patient at length today. His gait disturbance could be in part secondary to neuropathy but also secondary to taking potentially sedating medications. He is cautioned regarding his sedating medications, these include meclizine, clonazepam, diphenhydramine and fairly high-dose, and trazodone. He is encouraged to discuss with you potentially streamlining his medications. For his neuropathy I suggested we proceed with laboratory testing today and schedule him for EMG and nerve conduction testing. We will keep them posted as to his test results by phone call. Furthermore, I would like for him to consider coming in for a sleep study to rule out obstructive sleep apnea as this can be a contributor to his daytime symptoms. He is discouraged from taking diphenhydramine on a nightly basis, in particular since he takes 2 of the 50 mg strength at bedtime and sometimes 2 additional pills in the middle of the night. This can certainly have an adverse effect on his balance and daytime functioning.He is advised to change positions slowly, stay well-hydrated, consider a sleep study. He's currently reluctant to proceed with a sleep study and he is encouraged to think about it at least. I will see him back after his EMG and nerve conduction study and we will also keep him posted as to his blood test results in the interim. I answered all his questions today and he was in agreement with the plan.Thank you very much for allowing me to participate  in the care of this nice patient. If I can be of any further assistance to you please do not  hesitate to call me at [phone].  Sincerely,   Joseph Age, MD, PhD

## 2019-02-17 NOTE — Patient Instructions (Signed)
I think your tremor may be stress related. You could have a form of essential tremor, but your family history is not supportive of a familial tremor. You don't have signs of Parkinson's disease.  I would not want to try any new medication quite yet for fear of side effects.    Please remember, that any kind of tremor may be exacerbated by anxiety, anger, nervousness, excitement, dehydration, sleep deprivation, by caffeine, and low blood sugar values or blood sugar fluctuations. Some medications, especially some antidepressants and lithium can cause or exacerbate tremors.  I think you have nerve damage called neuropathy.  We will check blood work today and call you with the test results. We will do an EMG and nerve conduction velocity test, which is an electrical nerve and muscle test, which we will schedule. We will call you with the results.  Please think about doing a sleep study. You do not sleep well. You may have sleep apnea, we can do the sleep study here.  I think your balance problem and gait disorder is due to multiple factors, but could be in part due to taking several potentially sedating medications, in fact, I would recommend you not take the Jet Asleep sleep aid, as it can make you drowsy and off balance and cause mouth dryness.

## 2019-02-21 ENCOUNTER — Telehealth: Payer: Self-pay

## 2019-02-21 LAB — TSH: TSH: 1.54 u[IU]/mL (ref 0.450–4.500)

## 2019-02-21 LAB — MULTIPLE MYELOMA PANEL, SERUM
ALBUMIN/GLOB SERPL: 1.1 (ref 0.7–1.7)
Albumin SerPl Elph-Mcnc: 3.4 g/dL (ref 2.9–4.4)
Alpha 1: 0.3 g/dL (ref 0.0–0.4)
Alpha2 Glob SerPl Elph-Mcnc: 0.8 g/dL (ref 0.4–1.0)
B-Globulin SerPl Elph-Mcnc: 1.3 g/dL (ref 0.7–1.3)
GAMMA GLOB SERPL ELPH-MCNC: 0.8 g/dL (ref 0.4–1.8)
Globulin, Total: 3.2 g/dL (ref 2.2–3.9)
IgA/Immunoglobulin A, Serum: 299 mg/dL (ref 61–437)
IgG (Immunoglobin G), Serum: 862 mg/dL (ref 700–1600)
IgM (Immunoglobulin M), Srm: 186 mg/dL — ABNORMAL HIGH (ref 15–143)
Total Protein: 6.6 g/dL (ref 6.0–8.5)

## 2019-02-21 LAB — HGB A1C W/O EAG: Hgb A1c MFr Bld: 5.5 % (ref 4.8–5.6)

## 2019-02-21 LAB — B12 AND FOLATE PANEL
Folate: 14.7 ng/mL (ref >3.0)
Vitamin B-12: 929 pg/mL (ref 232–1245)

## 2019-02-21 LAB — ANA W/REFLEX: Anti Nuclear Antibody (ANA): NEGATIVE

## 2019-02-21 NOTE — Telephone Encounter (Signed)
I called pt to discuss his lab results. No answer, left a message asking him to call me back. 

## 2019-02-21 NOTE — Progress Notes (Signed)
Labs fine, including diabetes marker, thyroid screening, Vit B12 and autoimmune disease marker test. One test is pending, which is the protein breakdown and antibody content test of his blood. Will call if abnormal, if this is okay with him, otherwise will not call for this last test.  Please update patient.  Michel Bickers

## 2019-02-21 NOTE — Telephone Encounter (Signed)
-----   Message from Star Age, MD sent at 02/21/2019  8:53 AM EDT ----- Labs fine, including diabetes marker, thyroid screening, Vit B12 and autoimmune disease marker test. One test is pending, which is the protein breakdown and antibody content test of his blood. Will call if abnormal, if this is okay with him, otherwise will not call for this last test.  Please update patient.  Michel Bickers

## 2019-02-22 NOTE — Telephone Encounter (Signed)
I called pt, discussed his lab results and recommendations. Pt understands that if the pending blood work is normal we will not call for this last test. Pt verbalized understanding of results. Pt had no questions at this time but was encouraged to call back if questions arise.

## 2019-02-22 NOTE — Telephone Encounter (Signed)
Pt returned call. Please call back as soon as available.  °

## 2019-03-02 ENCOUNTER — Other Ambulatory Visit: Payer: Self-pay | Admitting: *Deleted

## 2019-03-02 DIAGNOSIS — I712 Thoracic aortic aneurysm, without rupture, unspecified: Secondary | ICD-10-CM

## 2019-03-15 ENCOUNTER — Telehealth: Payer: Self-pay

## 2019-03-15 NOTE — Telephone Encounter (Signed)
I called and left a detailed message for patient letting him know that Due to current COVID 19 pandemic, our office is severely reducing in office visits for at least the next 2 weeks, in order to minimize the risk to our patients and healthcare providers.   I advised that we would be cancelling this appt and that I would call back in a few weeks to reschedule.

## 2019-03-24 ENCOUNTER — Encounter: Payer: Medicare HMO | Admitting: Diagnostic Neuroimaging

## 2019-04-04 ENCOUNTER — Ambulatory Visit (INDEPENDENT_AMBULATORY_CARE_PROVIDER_SITE_OTHER): Payer: Medicare HMO | Admitting: Nurse Practitioner

## 2019-04-04 ENCOUNTER — Other Ambulatory Visit: Payer: Self-pay

## 2019-04-04 ENCOUNTER — Encounter: Payer: Self-pay | Admitting: Nurse Practitioner

## 2019-04-04 DIAGNOSIS — I7300 Raynaud's syndrome without gangrene: Secondary | ICD-10-CM | POA: Diagnosis not present

## 2019-04-04 DIAGNOSIS — M1A09X0 Idiopathic chronic gout, multiple sites, without tophus (tophi): Secondary | ICD-10-CM

## 2019-04-04 DIAGNOSIS — F5101 Primary insomnia: Secondary | ICD-10-CM | POA: Diagnosis not present

## 2019-04-04 DIAGNOSIS — N400 Enlarged prostate without lower urinary tract symptoms: Secondary | ICD-10-CM | POA: Diagnosis not present

## 2019-04-04 DIAGNOSIS — I7121 Aneurysm of the ascending aorta, without rupture: Secondary | ICD-10-CM

## 2019-04-04 DIAGNOSIS — I712 Thoracic aortic aneurysm, without rupture: Secondary | ICD-10-CM | POA: Diagnosis not present

## 2019-04-04 DIAGNOSIS — E785 Hyperlipidemia, unspecified: Secondary | ICD-10-CM | POA: Diagnosis not present

## 2019-04-04 DIAGNOSIS — E876 Hypokalemia: Secondary | ICD-10-CM

## 2019-04-04 DIAGNOSIS — I8393 Asymptomatic varicose veins of bilateral lower extremities: Secondary | ICD-10-CM | POA: Diagnosis not present

## 2019-04-04 DIAGNOSIS — I10 Essential (primary) hypertension: Secondary | ICD-10-CM | POA: Diagnosis not present

## 2019-04-04 MED ORDER — METOPROLOL TARTRATE 25 MG PO TABS: 12.5000 mg | ORAL_TABLET | Freq: Two times a day (BID) | ORAL | 3 refills | Status: DC

## 2019-04-04 MED ORDER — NIFEDIPINE ER OSMOTIC RELEASE 30 MG PO TB24: 30.0000 mg | ORAL_TABLET | Freq: Every day | ORAL | 1 refills | Status: DC

## 2019-04-04 MED ORDER — HYDROCHLOROTHIAZIDE 25 MG PO TABS: 25.0000 mg | ORAL_TABLET | Freq: Every day | ORAL | 1 refills | Status: DC

## 2019-04-04 MED ORDER — BENAZEPRIL HCL 20 MG PO TABS: 20.0000 mg | ORAL_TABLET | Freq: Every day | ORAL | 1 refills | Status: DC

## 2019-04-04 MED ORDER — POTASSIUM 99 MG PO TABS: 1.0000 | ORAL_TABLET | Freq: Every day | ORAL | 1 refills | Status: DC

## 2019-04-04 MED ORDER — ATORVASTATIN CALCIUM 40 MG PO TABS: 40.0000 mg | ORAL_TABLET | Freq: Every day | ORAL | 1 refills | Status: DC

## 2019-04-04 MED ORDER — TRAZODONE HCL 50 MG PO TABS: 25.0000 mg | ORAL_TABLET | Freq: Every evening | ORAL | 5 refills | Status: DC | PRN

## 2019-04-04 NOTE — Progress Notes (Signed)
Virtual Visit via telephone Note  I connected with Joseph [name] on 04/04/19 at 10:35 AM by telephone and verified that I am speaking with the correct person using two identifiers. Joseph [name] is currently located at home and no one is currently with her during visit. The provider, Mary-Margaret Hassell Done, FNP is located in their office at time of visit.  I discussed the limitations, risks, security and privacy concerns of performing an evaluation and management service by telephone and the availability of in person appointments. I also discussed with the patient that there may be a patient responsible charge related to this service. The patient expressed understanding and agreed to proceed.   History and Present Illness:   Chief Complaint: Medical Management of Chronic Issues    HPI:  1. Essential hypertension, benign Denies chest pain, sob or headache. Does not check blood pressure at home. BP Readings from Last 3 Encounters:  02/17/19 118/65  01/04/19 123/71  10/01/18 124/68     2. Thoracic ascending aortic aneurysm (Uncertain) Last ct angiogram was doen 04/15/18 and showed stable aortic aneurysm measuring 4.5cM- he is scheduled for follow up CT in June 2020.  3. Varicose veins of both lower extremities, unspecified whether complicated Has large varicose veins in bil lower ext. His legs feel heavy if he walks or stands alot  4. Raynaud's disease without gangrene Hands are not bothering him right now since it is getting warmer outside.  5. Hypokalemia Denies any lower ext cramping.  6. Hyperlipidemia with target LDL less than 100 does not watch diet and does no exercise  7. Primary insomnia He takes trazadone to sleep and he says that he sleeps well most nights  8. Idiopathic chronic gout of multiple sites without tophus Denies nay recent gout flare ups  9. Benign prostatic hyperplasia without lower urinary tract symptoms Denies any problems voiding  10. Severe obesity  (BMI >= 40) (HCC) No recent weight changes    Outpatient Encounter Medications as of 04/04/2019  Medication Sig  . allopurinol (ZYLOPRIM) 300 MG tablet Take 1 tablet (300 mg total) by mouth daily.  Marland Kitchen atorvastatin (LIPITOR) 40 MG tablet Take 1 tablet (40 mg total) by mouth daily.  . benazepril (LOTENSIN) 20 MG tablet Take 1 tablet (20 mg total) by mouth daily.  . clonazePAM (KLONOPIN) 0.5 MG tablet Take 1 tablet (0.5 mg total) by mouth 2 (two) times daily as needed.  . fish oil-omega-3 fatty acids 1000 MG capsule Take 2 g by mouth daily.  . hydrochlorothiazide (HYDRODIURIL) 25 MG tablet Take 1 tablet (25 mg total) by mouth daily.  . meclizine (ANTIVERT) 25 MG tablet TAKE 1/2 TO 1 (ONE-HALF TO ONE) TABLET BY MOUTH THREE TIMES DAILY AS NEEDED FOR DIZZINESS  . Melatonin 5 MG TABS Take 5 mg by mouth at bedtime.  . metoprolol tartrate (LOPRESSOR) 25 MG tablet Take 0.5 tablets (12.5 mg total) by mouth 2 (two) times daily.  . Multiple Vitamins-Minerals (COMPLETE MULTIVITAMIN/MINERAL PO) Take 1 tablet by mouth daily.  Marland Kitchen NIFEdipine (PROCARDIA-XL/NIFEDICAL-XL) 30 MG 24 hr tablet Take 1 tablet (30 mg total) by mouth daily.  . Potassium 99 MG TABS Take 1 tablet (99 mg total) by mouth daily.  . sildenafil (REVATIO) 20 MG tablet TAKE 1 TO 5 TABLETS BY MOUTH AS NEEDED  . traZODone (DESYREL) 50 MG tablet Take 0.5-1 tablets (25-50 mg total) by mouth at bedtime as needed. for sleep      New complaints: None today  Social history: He  is doing well. He has a new girlfriend and that has really helped him. He is doing so wll that he has stopped taking his klonopin     Review of Systems  Constitutional: Negative for diaphoresis and weight loss.  Eyes: Negative for blurred vision, double vision and pain.  Respiratory: Negative for shortness of breath.   Cardiovascular: Negative for chest pain, palpitations, orthopnea and leg swelling.  Gastrointestinal: Negative for abdominal pain.  Skin: Negative for  rash.  Neurological: Negative for dizziness, sensory change, loss of consciousness, weakness and headaches.  Endo/Heme/Allergies: Negative for polydipsia. Does not bruise/bleed easily.  Psychiatric/Behavioral: Negative for memory loss. The patient does not have insomnia.   All other systems reviewed and are negative.    Observations/Objective: Alert and oriented- answers all questions apporpriately No distress  Assessment and Plan: [name] comes in today with chief complaint of Medical Management of Chronic Issues   Diagnosis and orders addressed:  1. Essential hypertension, benign Low sodium diet - hydrochlorothiazide (HYDRODIURIL) 25 MG tablet; Take 1 tablet (25 mg total) by mouth daily.  Dispense: 90 tablet; Refill: 1 - benazepril (LOTENSIN) 20 MG tablet; Take 1 tablet (20 mg total) by mouth daily.  Dispense: 90 tablet; Refill: 1 - metoprolol tartrate (LOPRESSOR) 25 MG tablet; Take 0.5 tablets (12.5 mg total) by mouth 2 (two) times daily.  Dispense: 90 tablet; Refill: 3  2. Thoracic ascending aortic aneurysm (HCC) - NIFEdipine (PROCARDIA-XL/NIFEDICAL-XL) 30 MG 24 hr tablet; Take 1 tablet (30 mg total) by mouth daily.  Dispense: 90 tablet; Refill: 1  3. Varicose veins of both lower extremities, unspecified whether complicated Wear compression socks  4. Raynaud's disease without gangrene Keep hands and feet warm  5. Hypokalemia - Potassium 99 MG TABS; Take 1 tablet (99 mg total) by mouth daily.  Dispense: 330 each; Refill: 1  6. Hyperlipidemia with target LDL less than 100 Low aft diet - atorvastatin (LIPITOR) 40 MG tablet; Take 1 tablet (40 mg total) by mouth daily.  Dispense: 90 tablet; Refill: 1  7. Primary insomnia Bedtime routine - traZODone (DESYREL) 50 MG tablet; Take 0.5-1 tablets (25-50 mg total) by mouth at bedtime as needed. for sleep  Dispense: 30 tablet; Refill: 5  8. Idiopathic chronic gout of multiple sites without tophus  9. Benign prostatic  hyperplasia without lower urinary tract symptoms  10. Severe obesity (BMI >= 40) (HCC) Discussed diet and exercise for person with BMI >25 Will recheck weight in 3-6 months    Labs pending Health Maintenance reviewed Diet and exercise encouraged  Follow up plan: 3 months      I discussed the assessment and treatment plan with the patient. The patient was provided an opportunity to ask questions and all were answered. The patient agreed with the plan and demonstrated an understanding of the instructions.   The patient was advised to call back or seek an in-person evaluation if the symptoms worsen or if the condition fails to improve as anticipated.  The above assessment and management plan was discussed with the patient. The patient verbalized understanding of and has agreed to the management plan. Patient is aware to call the clinic if symptoms persist or worsen. Patient is aware when to return to the clinic for a follow-up visit. Patient educated on when it is appropriate to go to the emergency department.   Time call ended:  10:55 I provided 20  minutes of non-face-to-face time during this encounter.    Mary-Margaret Hassell Done, FNP

## 2019-04-22 ENCOUNTER — Other Ambulatory Visit: Payer: Self-pay | Admitting: Nurse Practitioner

## 2019-04-22 DIAGNOSIS — M79604 Pain in right leg: Secondary | ICD-10-CM

## 2019-04-22 NOTE — Telephone Encounter (Signed)
Last office visit 04/04/2019

## 2019-04-22 NOTE — Telephone Encounter (Signed)
[id number] 5/22

## 2019-04-22 NOTE — Telephone Encounter (Signed)
Patient needs an appointment for this refill, it can be virtual and it can be with 1 of [place] because [facility]-Margaret is out next week, see if he can get in for Tuesday.

## 2019-04-27 ENCOUNTER — Telehealth: Payer: Self-pay | Admitting: Nurse Practitioner

## 2019-04-27 NOTE — Telephone Encounter (Signed)
Advise on tramadol refill.

## 2019-04-27 NOTE — Telephone Encounter (Signed)
Pt requesting refill on tramadol Pt had recent visit with [facility] Per Dr Warrick Parisian, pt will need appt with provider or can wait for [facility] Pt declined appt will wait for [facility] for refill

## 2019-04-28 ENCOUNTER — Other Ambulatory Visit: Payer: Self-pay | Admitting: Physician Assistant

## 2019-04-28 MED ORDER — TRAMADOL HCL 50 MG PO TABS: 50.0000 mg | ORAL_TABLET | Freq: Two times a day (BID) | ORAL | 0 refills | Status: DC

## 2019-04-28 NOTE — Telephone Encounter (Signed)
FYI for provider

## 2019-04-28 NOTE — Telephone Encounter (Signed)
I am sending once, send this note to Shelah Lewandowsky so that she knows.

## 2019-04-28 NOTE — Telephone Encounter (Signed)
I did not realize [name] was off. Being out of the office had me not knowing. Sent once and pass this to [facility].

## 2019-04-28 NOTE — Telephone Encounter (Signed)
Aware. Left message, one refill done, will need to follow up with provider.

## 2019-05-18 ENCOUNTER — Other Ambulatory Visit: Payer: Self-pay

## 2019-05-18 ENCOUNTER — Other Ambulatory Visit: Payer: Self-pay | Admitting: *Deleted

## 2019-05-18 NOTE — Progress Notes (Unsigned)
ct 

## 2019-05-19 ENCOUNTER — Ambulatory Visit: Payer: Medicare HMO | Admitting: Cardiothoracic Surgery

## 2019-05-19 ENCOUNTER — Ambulatory Visit
Admission: RE | Admit: 2019-05-19 | Discharge: 2019-05-19 | Disposition: A | Discharge status: Home / Self Care | Payer: Medicare HMO | Source: Ambulatory Visit | Attending: Cardiothoracic Surgery | Admitting: Cardiothoracic Surgery

## 2019-05-19 ENCOUNTER — Encounter: Payer: Self-pay | Admitting: Cardiothoracic Surgery

## 2019-05-19 VITALS — BP 150/70 | HR 80 | Temp 97.7°F | Resp 20 | Ht 68.0 in | Wt 249.0 lb

## 2019-05-19 DIAGNOSIS — I712 Thoracic aortic aneurysm, without rupture, unspecified: Secondary | ICD-10-CM

## 2019-05-19 MED ORDER — IOPAMIDOL (ISOVUE-370) INJECTION 76%
75.0000 mL | Freq: Once | INTRAVENOUS | Status: AC | PRN
  Administered 2019-05-19: 75 mL via INTRAVENOUS

## 2019-05-19 NOTE — Progress Notes (Signed)
[name]       [place] [address]             [phone]                    Jkai F Brener [place] Medical Record #[id number] Date of Birth: 1940/12/26  Referring: Hinda Lenis    Chief Complaint:    Chief Complaint  Patient presents with  . Thoracic Aortic Aneurysm    1 year f/u with CTA Chest    History of Present Illness:    Joseph [name] 78 y.o. male is seen in the office in follow-up for a known dilatation of the ascending aorta. Since last seen he has had no change in his cardiac status.  He denies angina or significant shortness of breath.  The patient notes that on June 04 2015 holiday he developed epigastric and substernal chest pain radiating to the left neck into both arms, with numbness in his hands. At the time he was camping at [facility] and went to the [facility] emergency room and was admitted. He was hospitalized for several days.  The patient did have a CT scan of the chest which revealed a 4.8 cm dilatation of the ascending aorta. On discharge the patient was told to see a thoracic surgeon. He has had serial CT od chest here since that time and returns today with repeat scan .  Patient notes that his wife died 2 years ago, does note that he is developed a new relationship with a girlfriend who has significantly helped him.  Current Activity/ Functional Status:  Patient is independent with mobility/ambulation, transfers, ADL's, IADL's.   Zubrod Score: At the time of surgery this patient's most appropriate activity status/level should be described as: []     0    Normal activity, no symptoms [x]     1    Restricted in physical strenuous activity but ambulatory, able to do out light work []     2    Ambulatory and capable of self care, unable to do work activities, up and about               >50 % of waking hours                              []     3    Only limited self care, in bed greater than 50% of waking hours []     4     Completely disabled, no self care, confined to bed or chair []     5    Moribund   Past Medical History:  Diagnosis Date  . Anxiety   . Ascending aortic aneurysm ([place])   . Essential hypertension   . GERD (gastroesophageal reflux disease)   . Gout   . History of kidney stones   . Insomnia     Past Surgical History:  Procedure Laterality Date  . CARDIAC CATHETERIZATION N/A 06/27/2015   Procedure: Left Heart Cath and Coronary Angiography;  Surgeon: Troy Sine, MD;  Location: [facility];  Service: Cardiovascular;  Laterality: N/A;  . HERNIA REPAIR    . KIDNEY STONE SURGERY      Family History  Problem Relation Age of Onset  . Heart disease Father   . Heart attack Father   . Diabetes Sister   . Heart disease Brother   .  Deep vein thrombosis Brother   . Congestive Heart Failure Brother   . Cancer Brother   . Leukemia Brother   . Heart attack Brother    patient has history of sudden death of his father at age 27 and his brother at age 24-  these were attributed to myocardial infarctions but there is no documented autopsy . The patient notes both of them died at home suddenly Patient's brother has died of heart failure and colon cancer last week   Social History   Socioeconomic History  . Marital status: Widowed    Spouse name: Not on file  . Number of children: 2  . Years of education: 10  . Highest education level: 10th grade  Occupational History  . 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 Financial resource strain: Not hard at all  . Food insecurity    Worry: Never true    Inability: Never true  . Transportation needs    Medical: No    Non-medical: No  Tobacco Use  . Smoking status: Former Smoker    Packs/day: 0.50    Years: 1.00    Pack years: 0.50    Types: Cigarettes    Quit date: 03/22/1958    Years since quitting: 61.2  . Smokeless tobacco: Former Systems developer    Types: Chew  . Tobacco comment:  QUIT CHEWING IN 1989  Substance and Sexual Activity  . Alcohol use: No    Alcohol/week: 0.0 standard drinks  . Drug use: No  . Sexual activity: Not Currently  Lifestyle  . Physical activity    Days per week: 5 days    Minutes per session: 60 min  . Stress: Only a little  Relationships  . Social connections    Talks on phone: More than three times a week    Gets together: More than three times a week    Attends religious service: More than 4 times per year    Active member of club or organization: Yes    Attends meetings of clubs or organizations: More than 4 times per year    Relationship status: Widowed  . Intimate partner violence    Fear of current or ex partner: No    Emotionally abused: No    Physically abused: No    Forced sexual activity: No  Other Topics Concern  . Not on file  Social History Narrative      Right handed    Caffeine use: none    Social History   Tobacco Use  Smoking Status Former Smoker  . Packs/day: 0.50  . Years: 1.00  . Pack years: 0.50  . Types: Cigarettes  . Quit date: 03/22/1958  . Years since quitting: 61.2  Smokeless Tobacco Former Systems developer  . Types: Chew  Tobacco Comment   QUIT CHEWING IN 1989    Social History   Substance and Sexual Activity  Alcohol Use No  . Alcohol/week: 0.0 standard drinks     Allergies  Allergen Reactions  . Carafate [Sucralfate] Other (See Comments)    TOOK SKIN FROM HIS MOUTH  . Ciprofloxacin Hcl Other (See Comments)    CAN'T REMEMBER  . Prevacid [Lansoprazole] Other (See Comments)    MADE HIM FEEL BAD  . Sulfa Antibiotics Diarrhea    SEVERE    Current Outpatient Medications  Medication Sig Dispense Refill  . allopurinol (ZYLOPRIM) 300 MG tablet Take 1 tablet (300 mg total) by mouth daily.  90 tablet 1  . atorvastatin (LIPITOR) 40 MG tablet Take 1 tablet (40 mg total) by mouth daily. 90 tablet 1  . benazepril (LOTENSIN) 20 MG tablet Take 1 tablet (20 mg total) by mouth daily. 90 tablet 1  .  clonazePAM (KLONOPIN) 0.5 MG tablet Take 1 tablet (0.5 mg total) by mouth 2 (two) times daily as needed. 60 tablet 2  . fish oil-omega-3 fatty acids 1000 MG capsule Take 2 g by mouth daily.    . hydrochlorothiazide (HYDRODIURIL) 25 MG tablet Take 1 tablet (25 mg total) by mouth daily. 90 tablet 1  . meclizine (ANTIVERT) 25 MG tablet TAKE 1/2 TO 1 (ONE-HALF TO ONE) TABLET BY MOUTH THREE TIMES DAILY AS NEEDED FOR DIZZINESS 30 tablet 2  . Melatonin 5 MG TABS Take 5 mg by mouth at bedtime.    . metoprolol tartrate (LOPRESSOR) 25 MG tablet Take 0.5 tablets (12.5 mg total) by mouth 2 (two) times daily. 90 tablet 3  . Multiple Vitamins-Minerals (COMPLETE MULTIVITAMIN/MINERAL PO) Take 1 tablet by mouth daily.    Marland Kitchen NIFEdipine (PROCARDIA-XL/NIFEDICAL-XL) 30 MG 24 hr tablet Take 1 tablet (30 mg total) by mouth daily. 90 tablet 1  . Potassium 99 MG TABS Take 1 tablet (99 mg total) by mouth daily. 330 each 1  . sildenafil (REVATIO) 20 MG tablet TAKE 1 TO 5 TABLETS BY MOUTH AS NEEDED    . traMADol (ULTRAM) 50 MG tablet Take 1 tablet (50 mg total) by mouth 2 (two) times daily. 60 tablet 0  . traZODone (DESYREL) 50 MG tablet Take 0.5-1 tablets (25-50 mg total) by mouth at bedtime as needed. for sleep 30 tablet 5   No current facility-administered medications for this visit.       Review of Systems:  ROS    Physical Exam: BP (!) 150/70   Pulse 80   Temp 97.7 F (36.5 C) (Skin)   Resp 20   Ht 5\' 8"  (1.727 m)   Wt 249 lb (112.9 kg)   SpO2 91% Comment: RA  BMI 37.86 kg/m   PHYSICAL EXAMINATION: General appearance: alert and cooperative Head: Normocephalic, without obvious abnormality, atraumatic Neck: no adenopathy, no carotid bruit, no JVD, supple, symmetrical, trachea midline and thyroid not enlarged, symmetric, no tenderness/mass/nodules Lymph nodes: Cervical, supraclavicular, and axillary nodes normal. Resp: clear to auscultation bilaterally Back: symmetric, no curvature. ROM normal. No CVA  tenderness. Cardio: regular rate and rhythm, S1, S2 normal, no murmur, click, rub or gallop GI: soft, non-tender; bowel sounds normal; no masses,  no organomegaly Extremities: extremities normal, atraumatic, no cyanosis or edema Neurologic: Grossly normal severe varicose veins in both lower extremities below the knee, palpable DP and PT pulses bilaterally No  murmur of aortic insufficiency.   Diagnostic Studies & Laboratory data:     Recent Radiology Findings: Ct Angio Chest Aorta W &/or Wo Contrast  Result Date: 05/19/2019 CLINICAL DATA:  Follow-up thoracic aortic aneurysm. EXAM: CT ANGIOGRAPHY CHEST WITH CONTRAST TECHNIQUE: Multidetector CT imaging of the chest was performed using the standard protocol during bolus administration of intravenous contrast. Multiplanar CT image reconstructions and MIPs were obtained to evaluate the vascular anatomy. CONTRAST:  30mL ISOVUE-370 IOPAMIDOL (ISOVUE-370) INJECTION 76% COMPARISON:  Apr 15, 2018 FINDINGS: Cardiovascular: Atherosclerotic changes are seen in the thoracic and upper abdominal aorta. No dissection. The ascending thoracic aortic aneurysm measures 4.8 cm on series 6, image 67, not significantly changed. The remainder of the thoracic aorta and upper abdominal aorta are nonaneurysmal. The thoracic aortic arch demonstrates  a normal configuration of branching vessels. The central pulmonary arteries are unremarkable. Coronary artery calcifications are identified. The heart is unchanged. Mediastinum/Nodes: No enlarged mediastinal, hilar, or axillary lymph nodes. Thyroid gland, trachea, and esophagus demonstrate no significant findings. Lungs/Pleura: Central airways are normal. No pneumothorax. There is some atelectasis in the lingula which is chronic. There is scarring or atelectasis in the anterior right lower lobe which is stable. No suspicious nodules, masses, or infiltrates. Upper Abdomen: There is a cyst in the liver which is stable. There is a mass  posterior off the right kidney measuring 2.2 cm on series 6, image 166 unchanged since July of 2016. The attenuation is 24 Hounsfield units today, likely a mildly complicated cyst. The mass is definitely cystic on previous studies such as September 2017. No other abnormalities. Musculoskeletal: No chest wall abnormality. No acute or significant osseous findings. Review of the MIP images confirms the above findings. IMPRESSION: 1. The ascending thoracic aortic aneurysm measures 4.8 cm in greatest dimension, unchanged. Ascending thoracic aortic aneurysm. Recommend semi-annual imaging followup by CTA or MRA and referral to cardiothoracic surgery if not already obtained. This recommendation follows 2010 ACCF/AHA/AATS/ACR/ASA/SCA/SCAI/SIR/STS/SVM Guidelines for the Diagnosis and Management of Patients With Thoracic Aortic Disease. Circulation. 2010; 121: V956-L875. Aortic aneurysm NOS (ICD10-I71.9) 2. Atherosclerotic changes in the thoracic aorta. Coronary artery calcifications. 3. Hepatic cysts. 4. Mildly complicated renal cyst comparing to previous studies. Aortic Atherosclerosis (ICD10-I70.0). Electronically Signed   By: Dorise Bullion III M.D   On: 05/19/2019 12:26   I have independently reviewed the above radiology studies  and reviewed the findings with the patient.    Ct Angio Chest Aorta W/cm &/or Wo/cm  Result Date: 04/15/2018 CLINICAL DATA:  Thoracic aortic aneurysm without rupture EXAM: CT ANGIOGRAPHY CHEST WITH CONTRAST TECHNIQUE: Multidetector CT imaging of the chest was performed using the standard protocol during bolus administration of intravenous contrast. Multiplanar CT image reconstructions and MIPs were obtained to evaluate the vascular anatomy. CONTRAST:  78mL ISOVUE-370 IOPAMIDOL (ISOVUE-370) INJECTION 76% COMPARISON:  CT scan of July 16, 2017. FINDINGS: Cardiovascular: Atherosclerosis of thoracic aorta is noted without dissection. Great vessels are widely patent. Grossly stable 4.8 cm  ascending thoracic aortic aneurysm is noted. Transverse aortic arch measures 3.2 cm. Proximal descending thoracic aorta measures 3.1 cm. No pericardial effusion is noted. Mediastinum/Nodes: No enlarged mediastinal, hilar, or axillary lymph nodes. Thyroid gland, trachea, and esophagus demonstrate no significant findings. Lungs/Pleura: No pneumothorax or pleural effusion is noted. Minimal subsegmental atelectasis or scarring is noted in the right lower lobe. Upper Abdomen: No acute abnormality. Musculoskeletal: No chest wall abnormality. No acute or significant osseous findings. Review of the MIP images confirms the above findings. IMPRESSION: Grossly stable 4.8 cm ascending thoracic aortic aneurysm. Ascending thoracic aortic aneurysm. Recommend semi-annual imaging followup by CTA or MRA and referral to cardiothoracic surgery if not already obtained. This recommendation follows 2010 ACCF/AHA/AATS/ACR/ASA/SCA/SCAI/SIR/STS/SVM Guidelines for the Diagnosis and Management of Patients With Thoracic Aortic Disease. Circulation. 2010; 121: I433-I951. Aortic Atherosclerosis (ICD10-I70.0). Electronically Signed   By: Marijo Conception, M.D.   On: 04/15/2018 15:14      Ct Angio Chest Aorta W/cm &/or Wo/cm  Result Date: 07/16/2017 CLINICAL DATA:  Thoracic aortic aneurysm without rupture. EXAM: CT ANGIOGRAPHY CHEST WITH CONTRAST TECHNIQUE: Multidetector CT imaging of the chest was performed using the standard protocol during bolus administration of intravenous contrast. Multiplanar CT image reconstructions and MIPs were obtained to evaluate the vascular anatomy. CONTRAST:  75 mL of Isovue 370 intravenously. COMPARISON:  CT scan of August 28, 2016. FINDINGS: Cardiovascular: Stable 4.9 cm ascending thoracic aortic aneurysm is noted without rupture. Atherosclerosis of thoracic aorta is noted. Great vessels are widely patent without significant stenosis. No pericardial effusion is noted. Transverse aortic arch measures 3.0  cm. Proximal descending thoracic aorta measures 3.0 cm. Mediastinum/Nodes: No enlarged mediastinal, hilar, or axillary lymph nodes. Thyroid gland, trachea, and esophagus demonstrate no significant findings. Lungs/Pleura: No pneumothorax or pleural effusion is noted. Stable right basilar scarring is noted. No acute consolidative process is noted. Upper Abdomen: Stable hepatic cyst. Stable right renal cyst. Stable nonobstructive left renal calculus. Musculoskeletal: No chest wall abnormality. No acute or significant osseous findings. Review of the MIP images confirms the above findings. IMPRESSION: Stable 4.9 cm ascending thoracic aortic aneurysm. Ascending thoracic aortic aneurysm. Recommend semi-annual imaging followup by CTA or MRA and referral to cardiothoracic surgery if not already obtained. This recommendation follows 2010 ACCF/AHA/AATS/ACR/ASA/SCA/SCAI/SIR/STS/SVM Guidelines for the Diagnosis and Management of Patients With Thoracic Aortic Disease. Circulation. 2010; 121: K932-I712. Aortic Atherosclerosis (ICD10-I70.0). Electronically Signed   By: Marijo Conception, M.D.   On: 07/16/2017 12:16   Ct Angio Chest Aorta W &/or Wo Contrast  Result Date: 08/28/2016 CLINICAL DATA:  Thoracic ascending aortic aneurysm.  Follow-up. EXAM: CT ANGIOGRAPHY CHEST WITH CONTRAST TECHNIQUE: Multidetector CT imaging of the chest was performed using the standard protocol during bolus administration of intravenous contrast. Multiplanar CT image reconstructions and MIPs were obtained to evaluate the vascular anatomy. Creatinine was obtained on site at [facility] at [address] Results: Creatinine 1.0 mg/dL. CONTRAST:  75 mL Isovue 370 COMPARISON:  12/20/2015 FINDINGS: Cardiovascular: Ascending thoracic aorta near the sinotubular junction measures roughly 4.2 cm and unchanged. The mid ascending thoracic aorta measures up to 4.9 cm and stable. Proximal aortic arch measures 3.7 cm and stable. Distal aortic arch  measures 3.2 cm and stable. The mid ascending thoracic aorta measures 2.9 cm and stable. 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 Overall, there is mild atherosclerotic disease throughout the thoracic aorta. There is also focal plaque at the 4 o'clock position of the aorta at the hiatus on image 104. Celiac trunk, proximal SMA and bilateral renal arteries are patent. There are coronary artery calcifications. Normal caliber of the main pulmonary arteries. Negative for thoracic aortic dissection. Mediastinum/Nodes: No chest lymphadenopathy. Lungs/Pleura: Trachea and mainstem bronchi are patent. Stable scarring in the right lower lobe and along the right major fissure. Otherwise, the lungs are clear. No pleural effusions. Upper Abdomen: Stable 2.5 cm low-density structure in the central aspect of liver probably represents a cyst. There is an exophytic right renal cyst. Normal appearance of the adrenal glands. Small stone in the left kidney without hydronephrosis. Probable small left renal cysts. Musculoskeletal: No chest wall abnormality. No acute or significant osseous findings. Review of the MIP images confirms the above findings. IMPRESSION: Stable fusiform aneurysm of the ascending thoracic aorta measuring up to 4.9 cm. Recommend semi-annual imaging followup by CTA or MRA and referral to cardiothoracic surgery if not already obtained. This recommendation follows 2010 ACCF/AHA/AATS/ACR/ASA/SCA/SCAI/SIR/STS/SVM Guidelines for the Diagnosis and Management of Patients With Thoracic Aortic Disease. Circulation. 2010; 121: W580-D983 No acute chest abnormality. Electronically Signed   By: Markus Daft M.D.   On: 08/28/2016 14:05       Recent Lab Findings: Lab Results  Component Value Date   WBC 8.1 06/27/2015   HGB 13.2 06/27/2015  HCT 39.1 06/27/2015   PLT 182 06/27/2015   GLUCOSE 90 01/04/2019   CHOL 204 (H) 01/04/2019   TRIG 116  01/04/2019   HDL 42 01/04/2019   LDLCALC 139 (H) 01/04/2019   ALT 21 01/04/2019   AST 26 01/04/2019   NA 142 01/04/2019   K 4.5 01/04/2019   CL 101 01/04/2019   CREATININE 1.10 01/04/2019   BUN 17 01/04/2019   CO2 23 01/04/2019   TSH 1.540 02/17/2019   INR 1.08 06/27/2015   HGBA1C 5.5 02/17/2019   Ct Angio Chest Aorta W/cm &/or Wo/cm  12/20/2015  CLINICAL DATA:  TAA. Occ chest pain. 10# wt loss. Nonsmoker. No prev surg or hx ca. Prev Ct 2002 purged, report available. EXAM: CT ANGIOGRAPHY CHEST WITH CONTRAST TECHNIQUE: Multidetector CT imaging of the chest was performed using the standard protocol during bolus administration of intravenous contrast. Multiplanar CT image reconstructions and MIPs were obtained to evaluate the vascular anatomy. CONTRAST:  75 cc Isovue 370 COMPARISON:  Report of previous CT exam 07/06/2001, CT of the chest 06/04/2015 from [facility]: Heart: Coronary artery calcification is present. Trace pericardial effusion. Heart size is normal. Vascular structures: Ascending aortic aneurysm is 4.8 cm. No dissection. Moderate atherosclerotic calcification of the thoracic aorta. Moderate atherosclerotic calcification of the descending aorta, particularly at the level of the diaphragmatic hiatus. Mediastinum/thyroid: Left thyroid nodule measures 1.0 cm. No significant mediastinal, hilar, or axillary adenopathy. Lungs/Airways: No pulmonary nodules, pleural effusions, or infiltrates. Upper abdomen: Hypervascular 2.3 cm lesion identified at the dome of the right hepatic lobe, stable in appearance and consistent with benign hemangioma. A stable cyst is seen in the lateral segment of the left hepatic lobe measuring 2.4 cm. Findings are consistent with benign cyst. Small splenules are incidentally noted in the left upper quadrant the abdomen. Small hiatal hernia. Chest wall/osseous structures: Mild spondylosis of the thoracic spine. No suspicious lytic or blastic lesions are  identified. Review of the MIP images confirms the above findings. IMPRESSION: 1. Ascending aortic aneurysm, 4.8 cm. Appearance is stable compared with previous CT of the chest from July 2016. Recommend semi-annual imaging followup by CTA or MRA and referral to cardiothoracic surgery if not already obtained. This recommendation follows 2010 ACCF/AHA/AATS/ACR/ASA/SCA/SCAI/SIR/STS/SVM Guidelines for the Diagnosis and Management of Patients With Thoracic Aortic Disease. Circulation. 2010; 121: W389-H734 2. Coronary artery disease. 3. Trace pericardial effusion. 4. Benign appearing left thyroid nodule. No further evaluation is felt to be necessary based on consensus criteria. 5. Stable benign liver lesions. Electronically Signed   By: Nolon Nations M.D.   On: 12/20/2015 15:05      Stress test:done [facility]  CLINICAL DATA:  Chest pain  EXAM: MYOCARDIAL IMAGING WITH SPECT (REST AND PHARMACOLOGIC-STRESS)  GATED LEFT VENTRICULAR WALL MOTION STUDY  LEFT VENTRICULAR EJECTION FRACTION  TECHNIQUE: Standard myocardial SPECT imaging was performed after resting intravenous injection of 11.0 mCi Tc-67m sestamibi. Subsequently, intravenous infusion of Lexiscan was performed under the supervision of the Cardiology staff. At peak effect of the drug, 32.6 mCi Tc-18m sestamibi was injected intravenously and standard myocardial SPECT imaging was performed. Quantitative gated imaging was also performed to evaluate left ventricular wall motion, and estimate left ventricular ejection fraction.  COMPARISON:  None.  FINDINGS: Perfusion: Regional images of the left ventricular myocardium show no fixed defect to suggest infarct or reversible lesion to suggest ischemic change. No abnormality is identified on the SPECT study.  Wall Motion: Left ventricle is rather prominent. There is mild apical hypokinesis.  No other wall motion abnormality is identified in the left ventricle.  Left Ventricular Ejection  Fraction: 47 %  End diastolic volume 500 ml  End systolic volume 69 ml  IMPRESSION: 1. No reversible ischemia or infarction.  2. There is left ventricular prominence. There is mild apical hypokinesis. No other wall motion abnormalities are identified.  3. Left ventricular ejection fraction 47%  4. Intermediate risk stress test findings*.  *2012 Appropriate Use Criteria for Coronary Revascularization Focused Update: J Am Coll Cardiol. 9381;82(9):937-169. http://content.airportbarriers.com.aspx?articleid=1201161   Electronically Signed   By: Lowella Grip III M.D.   On: 06/06/2015 13:05  No significant chest pain symptoms reported  No significant arrhythmia noted  This is not a final report, NM images to follow  No significant ST segment changes or arrhythmias were noted during  stress  Uneventful Lexiscan injection.  Nuclear perfusion report to follow under separate cover  CT: CLINICAL DATA:  Epigastric pain. Shortness of breath, nausea. Back pain. History of hypertension, Raynaud's disease, hernia repair.  EXAM: CT ANGIOGRAPHY CHEST, ABDOMEN AND PELVIS  TECHNIQUE: Multidetector CT imaging through the chest, abdomen and pelvis was performed using the standard protocol during bolus administration of intravenous contrast. Multiplanar reconstructed images and MIPs were obtained and reviewed to evaluate the vascular anatomy.  CONTRAST:  100 cc Omnipaque 350  COMPARISON:  None.  FINDINGS: CTA CHEST FINDINGS  Mediastinum: Fusiform ascending aorta is enlarged, 4.8 cm with moderate calcific atherosclerosis of the aortic arch and descending aorta. Subcentimeter focal intimal hematoma descending aorta. No abnormal density within upper along the course of the aorta. Small amount of free fluid in the is sulcus, normal. No dissection, suspicious luminal irregularity/ulceration, contrast extravasation or periaortic hematoma. Heart size is mildly enlarged. Mild  coronary artery calcifications. No pericardial fluid collection. No mediastinal lymphadenopathy.  Lungs: No pleural effusion or focal consolidation. Minimal pleural thickening/scarring of the RIGHT major fissure. Tracheobronchial tree is patent and midline, no pneumothorax.  Soft tissues and osseous structures: Mild degenerative change of the thoracic spine. No destructive bony lesions. Mild gynecomastia.  Review of the MIP images confirms the above findings.  CTA ABDOMEN AND PELVIS FINDINGS  Retroperitoneum: Aortoiliac vessels are normal in course and caliber with moderate to severe calcific atherosclerosis. Celiac trunk origin is widely patent. Mild calcific atherosclerosis of the origin of the renal arteries, superior mesenteric artery. Inferior mesenteric artery is patent.  Organs: 2.5 cm flash filling hemangioma RIGHT lobe of the liver. 2.3 cm low-density cyst segment 4. No intrahepatic biliary dilatation. Spleen, gallbladder are normal. Fatty pancreas, otherwise unremarkable. Thickened adrenal glands can be seen with hyperplasia without discrete nodule.  GI tract: Small hiatal hernia. Stomach, small large bowel are normal in course and caliber without inflammatory changes though sensitivity may be decreased by lack of enteric contrast.  GU tract: Kidneys are orthotopic, normal size morphology. 4.7 cm exophytic LEFT lower pole cyst. 3 mm LEFT upper pole nonobstructing nephrolithiasis. 2.2 cm exophytic RIGHT interpolar cyst. Prostate is partially distended. Moderate prostatomegaly invading the base of the bladder.  Peritoneum: No intraperitoneal free fluid or free air. No lymphadenopathy by CT size criteria.  Soft tissues and osseous structures: Moderate to large LEFT, small RIGHT fat containing inguinal hernias. Small fat containing umbilical hernia. Grade 1 L5-S1 anterolisthesis, chronic bilateral L5 pars interarticularis defects, moderate to severe L5-S1 neural  foraminal narrowing.  Review of the MIP images confirms the above findings.  IMPRESSION: CTA CHEST: Fusiform 4.8 cm ascending aortic aneurysm without dissection or acute vascular process. Recommend  semi-annual imaging followup by CTA or MRA and referral to cardiothoracic surgery if not already obtained. This recommendation follows 2010 ACCF/AHA/AATS/ACR/ASA/SCA/SCAI/SIR/STS/SVM Guidelines for the Diagnosis and Management of Patients With Thoracic Aortic Disease. Circulation. 2010; 121: X914-N829.  No acute cardiopulmonary process.  CTA ABDOMEN AND PELVIS: Atherosclerosis of the aortoiliac vessels without high-grade stenosis or acute vascular process.  No acute intra-abdominal or pelvic process. 3 mm nonobstructing LEFT upper pole nephrolithiasis.  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  Prostatomegaly.   Electronically Signed   By: Elon Alas M.D.   On: 06/05/2015 00:21  ECHO: Transthoracic Echocardiography Report (TTE)  Demographics  Patient Name      TAKOTA CAHALAN     Gender                Male  Patient Number    [id number]   Race                  Caucasian  Visit Number      [id number]    Room Number           244  Accession Number  [id number]  Date of Study         06/05/2015  Date of Birth     1941-10-15     Referring Physician   ED Fay Records, MD  Age               49 year(s)     Sonographer           Marikate Tennis,                                                         RDCS, BS                                   Interpreting          Rozann Lesches, MD                                   Physician Procedure Type of Study  TTE procedure: ECHOCARDIOGRAM FOLLOW UP/ LIMITED W DOPPLER. Procedure date Date: 06/05/2015 Start: 09:20 AM Technical Quality: Adequate visualization Study Location: Portable Indications: Chest pain. Patient Status: Routine Height: 68.11 inches Weight: 255.74 pounds BSA: 2.27 m2 BMI: 38.76 kg/m2 Rhythm: Normal sinus rhythm HR: 82 bpm BP: 116/69  mmHg Conclusions Summary Mild concentric left ventricular hypertrophy Normal left ventricular systolic function with no appreciable segmental abnormality. Mild AR. Signature ------------------------------------------------------------------------------  Electronically signed by Rozann Lesches, MD(Interpreting physician) on 06/05/2015  01:27 PM ------------------------------------------------------------------------------ Findings Mitral Valve Structurally normal mitral valve with good mobility and no significant regurgitation by color flow doppler examination. Aortic Valve Structurally normal aortic valve with good leaflet mobility, and mild regurgitation by color flow doppler examination. Tricuspid Valve Tricuspid valve is structurally normal. Mild tricuspid regurgitation by color flow doppler examination. Pulmonic Valve Pulmonic valve is structurally normal. No Doppler evidence of pulmonic stenosis or insufficiency. Left Atrium Normal size left atrium. Left Ventricle Mild concentric left ventricular hypertrophy Normal left ventricular systolic function with no appreciable segmental abnormality. Right Atrium Mild right atrial enlargement Right Ventricle Mildly dilated right ventricle. Pericardial Effusion No evidence of  pericardial effusion. Miscellaneous Aortic root dimension within normal limits. IVC dilated. M-Mode/2D Measurements & Calculations  LV Diastolic Dimension:  LV Systolic Dimension:     LA Dimension: 3.42 cmAO  5.56 cm                  2.92 cm                    Root Dimension: 3.89 cm  LV FS:47.48 %            LV Volume Diastolic: 175  LV PW Diastolic: 1.4 cm  ml  Septum Diastolic: 1.02   LV Volume Systolic: 58.5  cm                       ml                           LV EDV/LV EDV Index: 127   LA/Aorta: 0.88                           ml/56 m2LV ESV/LV ESV                           Index: 50.5 ml/22 m2                           EF Calculated: 60.24 %  Doppler Measurements & Calculations  MV Peak E-Wave: 72.7 cm/s AV Peak Velocity: 111  MV Peak A-Wave: 86.4 cm/s cm/s                     Estimated RVSP: 32.8 mmHg  MV E/A Ratio: 0.84        AV Peak Gradient: 4.93   Estimated RAP:10 mmHg  MV Peak Gradient: 2.11    mmHg  mmHg                                                     PV Peak Velocity: 102  TDI E' Velocity: 15 cm/s                           cm/s                                                     PV Peak Gradient: 4.16                                                     mmHg  E/E' prime 4.9 [address] [place], [place] [address] [phone]   Cardiac Cath: 2016: dR kELLY  Mr. Ciampi is a 16 -year-old gentleman who has been documented to have a 4.8 cm ascending aortic aneurysm and is followed by Dr. Servando Snare. He was recently seen by Dr. Domenic Polite for Cardiologic evaluation. He has experience mild intermittent episodes  of chest pain and shortness of breath. A nuclear study was interpreted as intermediate risk with an ejection fraction at 47% with apical hypokinesis without definitive ischemia. Definitive cardiac catheterization was recommended.   The patient was brought to the second floor [facility] cath lab in the postabsorptive state. The patient was premedicated with Versed 2 mg and fentanyl 50 mcg. A right radial approach was utilized after an Allen's test verified adequate circulation. The right radial artery was punctured via the Seldinger technique, and a 6 [place] Glidesheath Slender was inserted without difficulty. A radial cocktail consisting of Verapamil, IV nitroglycerin, and lidocaine was administered. Weight adjusted heparin was administered. A safety J wire was advanced into the ascending aorta. Diagnostic catheterization was done with a 5 [place] TIG 4.0 catheter. A 5 French pigtail catheter was used for left ventriculography. A TR radial band was applied for hemostasis. The patient left the catheterization laboratory  in stable condition. There were no immediate complications during the procedure.      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  Right Coronary Artery  Mid RCA lesion, 35% stenosed.  Wall Motion              Left Heart   Left Ventricle The left ventricular size is normal. There is mild left ventricular systolic dysfunction. The left ventricular ejection fraction is 45-50% by visual estimate. Mild LV dysfunction with an ejection fraction at 45-50% and a subtle area mild mid inferior hypocontractility.    Aorta The ascending aorta is dilated.    Coronary Diagrams   Diagnostic Diagram            Aortic Size Index=  4.8       /Body surface area is 2.33 meters squared. = 2.05    < 2.75 cm/m2      4% risk per year 2.75 to 4.25          8% risk per year > 4.25 cm/m2    20% risk per year    Assessment / Plan:  # 1  Fusiform 4.8 cm ascending aortic aneurysm without dissection or acute vascular process-with a tricuspid aortic valve on echocardiogram without evidence of aortic stenosis or aortic insufficiency   Plan to see him back in 1 year with a follow-up CTA of the chest    Grace Isaac MD      [address] [place] [address] Office [phone]   Beeper [phone]  05/19/2019 2:34 PM

## 2019-05-19 NOTE — Patient Instructions (Signed)

## 2019-05-26 ENCOUNTER — Ambulatory Visit: Payer: Medicare HMO | Admitting: Neurology

## 2019-06-07 ENCOUNTER — Encounter (INDEPENDENT_AMBULATORY_CARE_PROVIDER_SITE_OTHER): Payer: Medicare HMO | Admitting: Neurology

## 2019-06-07 ENCOUNTER — Ambulatory Visit: Payer: Medicare HMO | Admitting: Neurology

## 2019-06-07 ENCOUNTER — Other Ambulatory Visit: Payer: Self-pay

## 2019-06-07 DIAGNOSIS — Z0289 Encounter for other administrative examinations: Secondary | ICD-10-CM

## 2019-06-07 DIAGNOSIS — G629 Polyneuropathy, unspecified: Secondary | ICD-10-CM

## 2019-06-07 NOTE — Progress Notes (Signed)
Full Name: Joseph [name] Gender: Male MRN #: [id number] Date of Birth: 1941/08/22    Visit Date: 06/07/2019 08:56 Age: 78 Years 63 Months Old Examining Physician: Arlice Colt, MD  Referring Physician: Rexene Alberts, MD    History: Mr. Joseph [name] is a 78 year old man with progressive numbness and gait disturbance over the past 2 to 3 years.  He also reports pain that radiates down the left leg from the hip towards the ankle.  On examination, he has mildly reduced vibration sensation in the hands and knees and complete loss of vibration sensation at the toes.  He has a length dependent gradient of touch sensation loss below mid shin.  There is mild weakness of the EHL muscles of both feet.   Nerve conduction studies: The right median and ulnar motor responses showed reduced amplitudes but normal distal latencies and only mildly reduced conduction velocities.  The ulnar F-wave latency was minimally prolonged.  The bilateral peroneal and tibial motor responses were absent.  The radial sensory response is reduced in amplitude with normal latency.  Bilateral sural and superficial peroneal sensory responses in the right median and ulnar sensory responses were absent.  Electromyography: Needle EMG of both legs and the right arm was performed.  In the legs, the left iliopsoas muscle was normal.  All other muscles showed chronic denervation with a length dependent gradient of severity.  Additionally there was acute denervation in all the muscles below the knees.  Left L5 innervated muscles appear to be more involved than right L5 innervated muscles.  In the right arm, proximal muscles were normal and near normal.  There was chronic elevation in the EDC muscle and mixed acute and chronic denervation in the hand muscles.  Impression: This NCV/EMG study shows the following: 1.  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  There is mixed acute and chronic denervation on the  EMG 2.   Superimposed left L5 radiculopathy     MNC    Nerve / Sites Muscle Latency Ref. Amplitude Ref. Rel Amp Segments Distance Velocity Ref. Area    ms ms mV mV %  cm m/s m/s mVms  R Median - APB     Wrist APB 4.2 ?4.4 2.0 ?4.0 100 Wrist - APB 7   4.3     Upper arm APB 9.9  1.7  87.5 Upper arm - Wrist 22 39 ?49 4.0  R Ulnar - ADM     Wrist ADM 3.2 ?3.3 2.8 ?6.0 100 Wrist - ADM 7   10.1     B.Elbow ADM 7.2  2.5  89.2 B.Elbow - Wrist 19 47 ?49 9.6     A.Elbow ADM 8.9  2.5  99.3 A.Elbow - B.Elbow 8 47 ?49 9.2         A.Elbow - Wrist      R Peroneal - EDB     Ankle EDB NR ?6.5 NR ?2.0 NR Ankle - EDB 9   NR     Fib head EDB NR  NR  NR Fib head - Ankle 28 NR ?44 NR     Pop fossa EDB NR  NR  NR Pop fossa - Fib head 10 NR ?44 NR         Pop fossa - Ankle      L Peroneal - EDB     Ankle EDB NR ?6.5 NR ?2.0 NR Ankle - EDB 9   NR  Fib head EDB NR  NR  NR Fib head - Ankle 27 NR ?44 NR     Pop fossa EDB NR  NR  NR Pop fossa - Fib head 10 NR ?44 NR         Pop fossa - Ankle      R Tibial - AH     Ankle AH NR ?5.8 NR ?4.0 NR Ankle - AH 9   NR  L Tibial - AH     Ankle AH NR ?5.8 NR ?4.0 NR Ankle - AH 9   NR                  SNC    Nerve / Sites Rec. Site Peak Lat Ref.  Amp Ref. Segments Distance    ms ms V V  cm  R Radial - Anatomical snuff box (Forearm)     Forearm Wrist 2.7 ?2.9 6 ?15 Forearm - Wrist 10  R Sural - Ankle (Calf)     Calf Ankle NR ?4.4 NR ?6 Calf - Ankle 14  L Sural - Ankle (Calf)     Calf Ankle NR ?4.4 NR ?6 Calf - Ankle 14  R Superficial peroneal - Ankle     Lat leg Ankle NR ?4.4 NR ?6 Lat leg - Ankle 14  L Superficial peroneal - Ankle     Lat leg Ankle NR ?4.4 NR ?6 Lat leg - Ankle 14  R Median - Orthodromic (Dig II, Mid palm)     Dig II Wrist NR ?3.4 NR ?10 Dig II - Wrist 13  R Ulnar - Orthodromic, (Dig V, Mid palm)     Dig V Wrist NR ?3.1 NR ?5 Dig V - Wrist 53                   F  Wave    Nerve F Lat Ref.   ms ms  R Ulnar - ADM 33.1 ?32.0        EMG full       EMG Summary Table    Spontaneous MUAP Recruitment  Muscle IA Fib PSW Fasc Other Amp Dur. Poly Pattern  R. Vastus medialis Normal None None None _______ Increased Increased 2+ Reduced  R. Tibialis anterior Normal 1+ 1+ None _______ Increased Increased 3+ Discrete  R. Peroneus longus Normal None 1+ None _______ Increased Increased 3+ Reduced  R. Gastrocnemius (Medial head) Normal 2+ 2+ None _______ Increased Increased 3+ Discrete  R. Abductor hallucis Normal 2+ 2+ None _______ Normal Normal 3+ Discrete  R. Gluteus medius Normal None None None _______ Increased Increased 2+ Reduced  R. Iliopsoas Normal None None None _______ Increased Increased 1+ Reduced  R. Abductor pollicis brevis Normal 2+ 2+ None _______ Increased Increased 2+ Reduced  R. FDIO Normal 2+ 2+ None _______ Increased Increased 2+ Reduced  R. Extensor digitorum communis Normal None None None _______ Increased Normal 1+ Reduced  R. Deltoid Normal None None None _______ Normal Normal Normal Normal  R. Triceps brachii Normal None None None _______ Normal Normal Normal Normal  R. Biceps brachii Normal None None None _______ Increased Normal 1+ Normal  L. Vastus medialis Normal None None None _______ Increased Increased 1+ Reduced  L. Tibialis anterior Normal 2+ 2+ None _______ Increased Increased 3+ Discrete  L. Peroneus longus Normal 1+ 1+ None _______ Increased Increased 3+ Discrete  L. Gastrocnemius (Medial head) Normal 1+ 1+ None _______ Increased Increased 2+ Reduced  L. Abductor hallucis Normal 2+  2+ None _______ Increased Increased 3+ Discrete  L. Iliopsoas Normal None None None _______ Normal Normal 1+ Normal

## 2019-06-17 ENCOUNTER — Other Ambulatory Visit: Payer: Self-pay | Admitting: Physician Assistant

## 2019-06-17 NOTE — Telephone Encounter (Signed)
Patient had regular follow up in May. Tramadol was not filled but Angel Rf'd x1 a month ago. Did you mean to fill at his appt? Please review

## 2019-06-22 ENCOUNTER — Telehealth: Payer: Self-pay | Admitting: Nurse Practitioner

## 2019-06-22 MED ORDER — TRAMADOL HCL 50 MG PO TABS: 50.0000 mg | ORAL_TABLET | Freq: Two times a day (BID) | ORAL | 0 refills | Status: DC

## 2019-06-22 NOTE — Telephone Encounter (Signed)
Pt had visit on 04/04/19, refills not done [place] refilled once on 04/28/19 Next OV (16mos fu) scheduled for 07/05/19

## 2019-06-24 ENCOUNTER — Ambulatory Visit: Payer: Medicare HMO | Admitting: Urology

## 2019-06-30 ENCOUNTER — Telehealth: Payer: Self-pay | Admitting: Nurse Practitioner

## 2019-06-30 NOTE — Chronic Care Management (AMB) (Signed)
°  Chronic Care Management   Outreach Note  06/30/2019 Name: Joseph [name] MRN: [id number] DOB: [birth year]  Referred by: Chevis Pretty, FNP Reason for referral : No chief complaint on file.   Third unsuccessful telephone outreach was attempted today. The patient was referred to the case management team for assistance with chronic care management and care coordination. The patient's primary care provider has been notified of our unsuccessful attempts to make or maintain contact with the patient. The care management team is pleased to engage with this patient at any time in the future should he/she be interested in assistance from the care management team.   Follow Up Plan: The care management team is available to follow up with the patient after provider conversation with the patient regarding recommendation for care management engagement and subsequent re-referral to the care management team.   [place]  [email or web address]   ??[phone]

## 2019-07-04 ENCOUNTER — Telehealth: Payer: Self-pay | Admitting: Nurse Practitioner

## 2019-07-05 ENCOUNTER — Encounter: Payer: Self-pay | Admitting: Nurse Practitioner

## 2019-07-05 ENCOUNTER — Other Ambulatory Visit: Payer: Self-pay

## 2019-07-05 ENCOUNTER — Ambulatory Visit (INDEPENDENT_AMBULATORY_CARE_PROVIDER_SITE_OTHER): Payer: Medicare HMO | Admitting: Nurse Practitioner

## 2019-07-05 VITALS — BP 126/70 | HR 80 | Temp 97.2°F | Ht 68.0 in | Wt 253.0 lb

## 2019-07-05 DIAGNOSIS — I7121 Aneurysm of the ascending aorta, without rupture: Secondary | ICD-10-CM

## 2019-07-05 DIAGNOSIS — N400 Enlarged prostate without lower urinary tract symptoms: Secondary | ICD-10-CM

## 2019-07-05 DIAGNOSIS — E785 Hyperlipidemia, unspecified: Secondary | ICD-10-CM | POA: Diagnosis not present

## 2019-07-05 DIAGNOSIS — I712 Thoracic aortic aneurysm, without rupture: Secondary | ICD-10-CM

## 2019-07-05 DIAGNOSIS — E876 Hypokalemia: Secondary | ICD-10-CM | POA: Diagnosis not present

## 2019-07-05 DIAGNOSIS — M79605 Pain in left leg: Secondary | ICD-10-CM | POA: Diagnosis not present

## 2019-07-05 DIAGNOSIS — R251 Tremor, unspecified: Secondary | ICD-10-CM

## 2019-07-05 DIAGNOSIS — Z79891 Long term (current) use of opiate analgesic: Secondary | ICD-10-CM | POA: Diagnosis not present

## 2019-07-05 DIAGNOSIS — I7300 Raynaud's syndrome without gangrene: Secondary | ICD-10-CM | POA: Diagnosis not present

## 2019-07-05 DIAGNOSIS — I8393 Asymptomatic varicose veins of bilateral lower extremities: Secondary | ICD-10-CM | POA: Diagnosis not present

## 2019-07-05 DIAGNOSIS — F5101 Primary insomnia: Secondary | ICD-10-CM | POA: Diagnosis not present

## 2019-07-05 DIAGNOSIS — F411 Generalized anxiety disorder: Secondary | ICD-10-CM

## 2019-07-05 DIAGNOSIS — M79604 Pain in right leg: Secondary | ICD-10-CM | POA: Diagnosis not present

## 2019-07-05 DIAGNOSIS — I10 Essential (primary) hypertension: Secondary | ICD-10-CM | POA: Diagnosis not present

## 2019-07-05 DIAGNOSIS — M1A09X0 Idiopathic chronic gout, multiple sites, without tophus (tophi): Secondary | ICD-10-CM

## 2019-07-05 MED ORDER — BENAZEPRIL HCL 20 MG PO TABS: 20.0000 mg | ORAL_TABLET | Freq: Every day | ORAL | 1 refills | Status: DC

## 2019-07-05 MED ORDER — NIFEDIPINE ER OSMOTIC RELEASE 30 MG PO TB24: 30.0000 mg | ORAL_TABLET | Freq: Every day | ORAL | 1 refills | Status: DC

## 2019-07-05 MED ORDER — POTASSIUM 99 MG PO TABS: 1.0000 | ORAL_TABLET | Freq: Every day | ORAL | 1 refills | Status: DC

## 2019-07-05 MED ORDER — TRAZODONE HCL 50 MG PO TABS: 25.0000 mg | ORAL_TABLET | Freq: Every evening | ORAL | 5 refills | Status: DC | PRN

## 2019-07-05 MED ORDER — ATORVASTATIN CALCIUM 40 MG PO TABS: 40.0000 mg | ORAL_TABLET | Freq: Every day | ORAL | 1 refills | Status: DC

## 2019-07-05 MED ORDER — TRAMADOL HCL 50 MG PO TABS: 50.0000 mg | ORAL_TABLET | Freq: Two times a day (BID) | ORAL | 2 refills | Status: DC

## 2019-07-05 MED ORDER — METOPROLOL TARTRATE 25 MG PO TABS: 12.5000 mg | ORAL_TABLET | Freq: Two times a day (BID) | ORAL | 3 refills | Status: DC

## 2019-07-05 MED ORDER — ALLOPURINOL 300 MG PO TABS: 300.0000 mg | ORAL_TABLET | Freq: Every day | ORAL | 1 refills | Status: DC

## 2019-07-05 MED ORDER — CLONAZEPAM 0.5 MG PO TABS: 0.5000 mg | ORAL_TABLET | Freq: Two times a day (BID) | ORAL | 2 refills | Status: DC | PRN

## 2019-07-05 MED ORDER — HYDROCHLOROTHIAZIDE 25 MG PO TABS: 25.0000 mg | ORAL_TABLET | Freq: Every day | ORAL | 1 refills | Status: DC

## 2019-07-05 NOTE — Patient Instructions (Signed)
Varicose Veins Varicose veins are veins that have become enlarged, bulged, and twisted. They most often appear in the legs. What are the causes? This condition is caused by damage to the valves in the vein. These valves help blood return to your heart. When they are damaged and they stop working properly, blood may flow backward and back up in the veins near the skin, causing the veins to get larger and appear twisted. The condition can result from any issue that causes blood to back up, like pregnancy, prolonged standing, or obesity. What increases the risk? This condition is more likely to develop in people who are:  On their feet a lot.  Pregnant.  Overweight. What are the signs or symptoms? Symptoms of this condition include:  Bulging, twisted, and bluish veins.  A feeling of heaviness. This may be worse at the end of the day.  Leg pain. This may be worse at the end of the day.  Swelling in the leg.  Changes in skin color over the veins. How is this diagnosed? This condition may be diagnosed based on your symptoms, a physical exam, and an ultrasound test. How is this treated? Treatment for this condition may involve:  Avoiding sitting or standing in one position for long periods of time.  Wearing compression stockings. These stockings help to prevent blood clots and reduce swelling in the legs.  Raising (elevating) the legs when resting.  Losing weight.  Exercising regularly. If you have persistent symptoms or want to improve the way your varicose veins look, you may choose to have a procedure to close the varicose veins off or to remove them. Treatments to close off the veins include:  Sclerotherapy. In this treatment, a solution is injected into a vein to close it off.  Laser treatment. In this treatment, the vein is heated with a laser to close it off.  Radiofrequency vein ablation. In this treatment, an electrical current produced by radio waves is used to close  off the vein. Treatments to remove the veins include:  Phlebectomy. In this treatment, the veins are removed through small incisions made over the veins.  Vein ligation and stripping. In this treatment, incisions are made over the veins. The veins are then removed after being tied (ligated) with stitches (sutures). Follow these instructions at home: Activity  Walk as much as possible. Walking increases blood flow. This helps blood return to the heart and takes pressure off your veins. It also increases your cardiovascular strength.  Follow your health care provider's instructions about exercising.  Do not stand or sit in one position for a long period of time.  Do not sit with your legs crossed.  Rest with your legs raised during the day. General instructions   Follow any diet instructions given to you by your health care provider.  Wear compression stockings as directed by your health care provider. Do not wear other kinds of tight clothing around your legs, pelvis, or waist.  Elevate your legs at night to above the level of your heart.  If you get a cut in the skin over the varicose vein and the vein bleeds: ? Lie down with your leg raised. ? Apply firm pressure to the cut with a clean cloth until the bleeding stops. ? Place a bandage (dressing) on the cut. Contact a health care provider if:  The skin around your varicose veins starts to break down.  You have pain, redness, tenderness, or hard swelling over a vein.  You   are uncomfortable because of pain.  You get a cut in the skin over a varicose vein and it will not stop bleeding. Summary  Varicose veins are veins that have become enlarged, bulged, and twisted. They most often appear in the legs.  This condition is caused by damage to the valves in the vein. These valves help blood return to your heart.  Treatment for this condition includes frequent movements, wearing compression stockings, losing weight, and  exercising regularly. In some cases, procedures are done to close off or remove the veins.  Treatment for this condition may include wearing compression stockings, elevating the legs, losing weight, and engaging in regular activity. In some cases, procedures are done to close off or remove the veins. This information is not intended to replace advice given to you by your health care provider. Make sure you discuss any questions you have with your health care provider. Document Released: 08/27/2005 Document Revised: 01/13/2019 Document Reviewed: 12/10/2016 Elsevier Patient Education  2020 [employer]  

## 2019-07-05 NOTE — Addendum Note (Signed)
Addended by: Rolena Infante on: 07/05/2019 02:29 PM   Modules accepted: Orders

## 2019-07-05 NOTE — Progress Notes (Signed)
Subjective:    Patient ID: Joseph [name], male    DOB: 28-Sep-1941, 78 y.o.   MRN: [id number]   Chief Complaint: Medical Management of Chronic Issues    HPI:  1. Essential hypertension, benign No c/o chest pain, sob or headache. Does not check blood pressure at home. BP Readings from Last 3 Encounters:  05/19/19 (!) 150/70  02/17/19 118/65  01/04/19 123/71     2. Hyperlipidemia with target LDL less than 100 Does not watch diet and does very little exercise if any.  3. Hypokalemia No c/o lower ext cramping  4. Thoracic ascending aortic aneurysm ([place]) Had CT angiogram 05/19/19 and size was unchanged from previous scan.. recommend semi annual follow up by CTA  5. Varicose veins of both lower extremities, unspecified whether complicated Legs feel heavy all the time but he stays busy anyway  6. Raynaud's disease without gangrene Hands and feet stay cold feeling. Does not bother him this time of year.  7. Benign prostatic hyperplasia without lower urinary tract symptoms Denies any problems voiding. Has not seen urology in several years.  8. Primary insomnia Takes trazadone to sleep at night. Feels reste din mornings  9. Idiopathic chronic gout of multiple sites without tophus No recent gout flare ups  10. Severe obesity (BMI >= 40) (HCC) No recent weight chnages  11. Chronic joint pain Takes ultram BID for joint pain.  Pain assessment: Cause of pain- arthritis Pain location- all over joint pain Pain on scale of 1-10- 4/10 today Frequency- daily What increases pain-nothing  What makes pain Better-rest Effects on ADL - none Any change in general medical condition-none  Current opioids rx- ultram # meds rx- 60 Effectiveness of current meds-helps Adverse reactions form pain meds-none Morphine equivalent- 10MEDD  Pill count performed-No Last drug screen - never ( high risk q76m, moderate risk q64m, low risk yearly ) Urine drug screen today- Yes Was the [place]  reviewed- yes  If yes were their any concerning findings? - no  Low opoid risk     Outpatient Encounter Medications as of 07/05/2019  Medication Sig  . allopurinol (ZYLOPRIM) 300 MG tablet Take 1 tablet (300 mg total) by mouth daily.  Marland Kitchen atorvastatin (LIPITOR) 40 MG tablet Take 1 tablet (40 mg total) by mouth daily.  . benazepril (LOTENSIN) 20 MG tablet Take 1 tablet (20 mg total) by mouth daily.  . clonazePAM (KLONOPIN) 0.5 MG tablet Take 1 tablet (0.5 mg total) by mouth 2 (two) times daily as needed.  . fish oil-omega-3 fatty acids 1000 MG capsule Take 2 g by mouth daily.  . hydrochlorothiazide (HYDRODIURIL) 25 MG tablet Take 1 tablet (25 mg total) by mouth daily.  . meclizine (ANTIVERT) 25 MG tablet TAKE 1/2 TO 1 (ONE-HALF TO ONE) TABLET BY MOUTH THREE TIMES DAILY AS NEEDED FOR DIZZINESS  . Melatonin 5 MG TABS Take 5 mg by mouth at bedtime.  . metoprolol tartrate (LOPRESSOR) 25 MG tablet Take 0.5 tablets (12.5 mg total) by mouth 2 (two) times daily.  . Multiple Vitamins-Minerals (COMPLETE MULTIVITAMIN/MINERAL PO) Take 1 tablet by mouth daily.  Marland Kitchen NIFEdipine (PROCARDIA-XL/NIFEDICAL-XL) 30 MG 24 hr tablet Take 1 tablet (30 mg total) by mouth daily.  . Potassium 99 MG TABS Take 1 tablet (99 mg total) by mouth daily.  . sildenafil (REVATIO) 20 MG tablet TAKE 1 TO 5 TABLETS BY MOUTH AS NEEDED  . traMADol (ULTRAM) 50 MG tablet Take 1 tablet (50 mg total) by mouth 2 (two) times daily.  Marland Kitchen  traZODone (DESYREL) 50 MG tablet Take 0.5-1 tablets (25-50 mg total) by mouth at bedtime as needed. for sleep     Past Surgical History:  Procedure Laterality Date  . CARDIAC CATHETERIZATION N/A 06/27/2015   Procedure: Left Heart Cath and Coronary Angiography;  Surgeon: Troy Sine, MD;  Location: [facility];  Service: Cardiovascular;  Laterality: N/A;  . HERNIA REPAIR    . KIDNEY STONE SURGERY      Family History  Problem Relation Age of Onset  . Heart disease Father   . Heart attack Father    . Diabetes Sister   . Heart disease Brother   . Deep vein thrombosis Brother   . Congestive Heart Failure Brother   . Cancer Brother   . Leukemia Brother   . Heart attack Brother     New complaints: None today  Social history: Wife died last year. Has a new friend that he spends a lot of time with.  Controlled substance contract: 01/04/19    Review of Systems  Constitutional: Negative for activity change and appetite change.  HENT: Negative.   Eyes: Negative for pain.  Respiratory: Negative for shortness of breath.   Cardiovascular: Positive for leg swelling (mild). Negative for chest pain and palpitations.  Gastrointestinal: Negative for abdominal pain.  Endocrine: Negative for polydipsia.  Genitourinary: Negative.   Musculoskeletal: Arthralgias: all over.  Skin: Negative for rash.  Neurological: Negative for dizziness, weakness and headaches.  Hematological: Does not bruise/bleed easily.  Psychiatric/Behavioral: Negative.   All other systems reviewed and are negative.      Objective:   Physical Exam Vitals signs and nursing note reviewed.  Constitutional:      Appearance: Normal appearance. He is well-developed.  HENT:     Head: Normocephalic.     Nose: Nose normal.  Eyes:     Pupils: Pupils are equal, round, and reactive to light.  Neck:     Musculoskeletal: Normal range of motion and neck supple.     Thyroid: No thyroid mass or thyromegaly.     Vascular: No carotid bruit or JVD.     Trachea: Phonation normal.  Cardiovascular:     Rate and Rhythm: Normal rate and regular rhythm.     Comments: Multiple varicose veins bil lower ext Pulmonary:     Effort: Pulmonary effort is normal. No respiratory distress.     Breath sounds: Normal breath sounds.  Abdominal:     General: Bowel sounds are normal.     Palpations: Abdomen is soft.     Tenderness: There is no abdominal tenderness.  Musculoskeletal: Normal range of motion.     Right lower leg: 1+ Edema  present.     Left lower leg: 1+ Edema present.  Lymphadenopathy:     Cervical: No cervical adenopathy.  Skin:    General: Skin is warm and dry.  Neurological:     Mental Status: He is alert and oriented to person, place, and time.  Psychiatric:        Behavior: Behavior normal.        Thought Content: Thought content normal.        Judgment: Judgment normal.     BP 126/70   Pulse 80   Temp (!) 97.2 F (36.2 C) (Oral)   Ht 5\' 8"  (1.727 m)   Wt 253 lb (114.8 kg)   BMI 38.47 kg/m          Assessment & Plan:  LAFAYETTE DUNLEVY comes in  today with chief complaint of Medical Management of Chronic Issues   Diagnosis and orders addressed:  1. Essential hypertension, benign Low sodium diet - hydrochlorothiazide (HYDRODIURIL) 25 MG tablet; Take 1 tablet (25 mg total) by mouth daily.  Dispense: 90 tablet; Refill: 1 - benazepril (LOTENSIN) 20 MG tablet; Take 1 tablet (20 mg total) by mouth daily.  Dispense: 90 tablet; Refill: 1 - metoprolol tartrate (LOPRESSOR) 25 MG tablet; Take 0.5 tablets (12.5 mg total) by mouth 2 (two) times daily.  Dispense: 90 tablet; Refill: 3  2. Hyperlipidemia with target LDL less than 100 Low fat diet - atorvastatin (LIPITOR) 40 MG tablet; Take 1 tablet (40 mg total) by mouth daily.  Dispense: 90 tablet; Refill: 1  3. Hypokalemia - Potassium 99 MG TABS; Take 1 tablet (99 mg total) by mouth daily.  Dispense: 90 tablet; Refill: 1  4. Thoracic ascending aortic aneurysm ([place]) Will follow up in january - NIFEdipine (PROCARDIA-XL/NIFEDICAL-XL) 30 MG 24 hr tablet; Take 1 tablet (30 mg total) by mouth daily.  Dispense: 90 tablet; Refill: 1  5. Varicose veins of both lower extremities, unspecified whether complicated Compression socks  6. Raynaud's disease without gangrene Keep hands warm  7. Benign prostatic hyperplasia without lower urinary tract symptoms Report any problems voiding  8. Primary insomnia Bedtime routine - traZODone (DESYREL) 50 MG  tablet; Take 0.5-1 tablets (25-50 mg total) by mouth at bedtime as needed. for sleep  Dispense: 30 tablet; Refill: 5  9. Idiopathic chronic gout of multiple sites without tophus Low purine diet - allopurinol (ZYLOPRIM) 300 MG tablet; Take 1 tablet (300 mg total) by mouth daily.  Dispense: 90 tablet; Refill: 1  10. Severe obesity (BMI >= 40) (HCC) Discussed diet and exercise for person with BMI >25 Will recheck weight in 3-6 months  11. GAD (generalized anxiety disorder) Stress management - clonazePAM (KLONOPIN) 0.5 MG tablet; Take 1 tablet (0.5 mg total) by mouth 2 (two) times daily as needed.  Dispense: 60 tablet; Refill: 2   Labs pending Health Maintenance reviewed Diet and exercise encouraged  Follow up plan: 3 months   Mary-Margaret Hassell Done, FNP

## 2019-07-06 LAB — CMP14+EGFR
ALT: 21 IU/L (ref 0–44)
AST: 20 IU/L (ref 0–40)
Albumin/Globulin Ratio: 1.8 (ref 1.2–2.2)
Albumin: 4.2 g/dL (ref 3.7–4.7)
Alkaline Phosphatase: 55 IU/L (ref 39–117)
BUN/Creatinine Ratio: 17 (ref 10–24)
BUN: 16 mg/dL (ref 8–27)
Bilirubin Total: 0.3 mg/dL (ref 0.0–1.2)
CO2: 22 mmol/L (ref 20–29)
Calcium: 9.5 mg/dL (ref 8.6–10.2)
Chloride: 103 mmol/L (ref 96–106)
Creatinine, Ser: 0.94 mg/dL (ref 0.76–1.27)
GFR calc Af Amer: 91 mL/min/{1.73_m2} (ref >59)
GFR calc non Af Amer: 78 mL/min/{1.73_m2} (ref >59)
Globulin, Total: 2.3 g/dL (ref 1.5–4.5)
Glucose: 103 mg/dL — ABNORMAL HIGH (ref 65–99)
Potassium: 4.2 mmol/L (ref 3.5–5.2)
Sodium: 142 mmol/L (ref 134–144)
Total Protein: 6.5 g/dL (ref 6.0–8.5)

## 2019-07-06 LAB — LIPID PANEL
Chol/HDL Ratio: 5.7 ratio — ABNORMAL HIGH (ref 0.0–5.0)
Cholesterol, Total: 189 mg/dL (ref 100–199)
HDL: 33 mg/dL — ABNORMAL LOW (ref >39)
LDL Calculated: 115 mg/dL — ABNORMAL HIGH (ref 0–99)
Triglycerides: 203 mg/dL — ABNORMAL HIGH (ref 0–149)
VLDL Cholesterol Cal: 41 mg/dL — ABNORMAL HIGH (ref 5–40)

## 2019-07-07 LAB — TOXASSURE SELECT 13 (MW), URINE

## 2019-07-18 ENCOUNTER — Telehealth: Payer: Self-pay

## 2019-07-18 NOTE — Telephone Encounter (Signed)
I called pt to discuss. No answer, left a message asking him to call me back. 

## 2019-07-18 NOTE — Telephone Encounter (Signed)
Patient's wife called wanting to know what Dr. Rexene Alberts recommends regarding the results of his NCV/EMG. She states that Dr. Felecia Shelling advised them that Dr. Rexene Alberts would reach out to them with the results and recommendations.

## 2019-07-18 NOTE — Telephone Encounter (Signed)
Please call patient and advise him that his nerve conduction and EMG showed evidence of chronic neuropathy/nerve damage, in the severe range. As I recall, he has had symptoms of nerve damage for the past 2 to 3 years. We had done blood work in our office initially which did not show any obvious cause for his nerve damage.  At this juncture, I would recommend evaluation with a neuromuscular specialist for further evaluation of a more rare cause of his neuropathy.  If he is agreeable, I would like to make a referral to either [facility] or [place] [facility] neurology, neuromuscular division for further evaluation. Please let me know. [facility] may be another option too if he prefers.

## 2019-07-19 NOTE — Telephone Encounter (Signed)
Sorry about that, I thought it would automatically go tto referring doctor.   I will make sure study is routed to you in the future

## 2019-07-19 NOTE — Telephone Encounter (Signed)
Richard: I was wondering, if there is a way for you to send me a copy of the EMG/NCV results after you read them? I usually get a notification when the other physicians read the studies and it comes as a result in my inbox which then prompts me to put a result note in and contact the patient.  I do not have any notifications when you read an EMG and it does not come to me in the inbox either.  Can you look into it and see what the others are doing differently for their interpretation notifications? Thank you!

## 2019-07-19 NOTE — Telephone Encounter (Signed)
No problem, I appreciate you helping with my studies!

## 2019-07-19 NOTE — Telephone Encounter (Signed)
I called pt again to discuss. No answer, left a message asking him to call me back. 

## 2019-07-19 NOTE — Telephone Encounter (Signed)
Pt returned my call. I discussed his NCV/EMG results and recommendations with him. Pt reports that he is disappointed that he was not contacted earlier regarding this and does not wish for a referral to a neuromuscular specialist at this time. If he changes his mind he will call us back. Pt verbalized understanding of results. Pt had no questions at this time but was encouraged to call back if questions arise.

## 2019-08-05 ENCOUNTER — Ambulatory Visit (INDEPENDENT_AMBULATORY_CARE_PROVIDER_SITE_OTHER): Payer: Medicare HMO | Admitting: *Deleted

## 2019-08-05 VITALS — Ht 68.0 in | Wt 253.1 lb

## 2019-08-05 DIAGNOSIS — Z0000 Encounter for general adult medical examination without abnormal findings: Secondary | ICD-10-CM

## 2019-08-05 NOTE — Progress Notes (Addendum)
MEDICARE ANNUAL WELLNESS VISIT  08/05/2019  Telephone Visit Disclaimer This Medicare AWV was conducted by telephone due to national recommendations for restrictions regarding the COVID-19 Pandemic (e.g. social distancing).  I verified, using two identifiers, that I am speaking with Joseph [name] or their authorized healthcare agent. I discussed the limitations, risks, security, and privacy concerns of performing an evaluation and management service by telephone and the potential availability of an in-person appointment in the future. The patient expressed understanding and agreed to proceed.   Subjective:  Joseph [name] is a 78 y.o. male patient of Chevis Pretty, [place] who had a Medicare Annual Wellness Visit today via telephone. Joseph [name] is Retired and lives with an adult companion. he has 2 children. he reports that he is socially active and does interact with friends/family regularly. he is not physically active and enjoys gardening.  Patient Care Team: Chevis Pretty, FNP as PCP - General (Nurse Practitioner) Satira Sark, MD as PCP - Cardiology (Cardiology) Satira Sark, MD as Consulting Physician (Cardiology)  Advanced Directives 08/05/2019 08/03/2018 06/27/2015 01/08/2015 10/05/2014  Does Patient Have a Medical Advance Directive? Yes Yes No No No  Type of Paramedic of [place];Living will [place];Living will - - -  Does patient want to make changes to medical advance directive? No - Patient declined No - Patient declined - - Yes - information given  Copy of [place] in Chart? No - copy requested No - copy requested - - -  Would patient like information on creating a medical advance directive? - - No - patient declined information Yes - Scientist, clinical (histocompatibility and immunogenetics) given Yes - Educational materials given    Hospital Utilization Over the Past 12 Months: # of hospitalizations or ER visits: 0 # of surgeries: 0   Review of Systems    Patient reports that his overall health is unchanged compared to last year.  No complaints  Patient Reported Readings (BP, Pulse, CBG, Weight, etc) none  Pain Assessment Pain : No/denies pain     Current Medications & Allergies (verified) Allergies as of 08/05/2019      Reactions   Carafate [sucralfate] Other (See Comments)   TOOK SKIN FROM HIS MOUTH   Ciprofloxacin Hcl Other (See Comments)   CAN'T REMEMBER   Prevacid [lansoprazole] Other (See Comments)   MADE HIM FEEL BAD   Quinolones    Patient was warned about not using Cipro and similar antibiotics. Recent studies have raised concern that fluoroquinolone antibiotics could be associated with an increased risk of aortic aneurysm Fluoroquinolones have non-antimicrobial properties that might jeopardise the integrity of the extracellular matrix of the vascular wall In a  propensity score matched cohort study in [place], there was a 66% increased rate of aortic aneurysm or dissection associated with oral fluoroquinolone use, compared wit   Sulfa Antibiotics Diarrhea   SEVERE      Medication List       Accurate as of August 05, 2019 10:37 AM. If you have any questions, ask your nurse or doctor.        allopurinol 300 MG tablet Commonly known as: ZYLOPRIM Take 1 tablet (300 mg total) by mouth daily.   atorvastatin 40 MG tablet Commonly known as: Lipitor Take 1 tablet (40 mg total) by mouth daily.   benazepril 20 MG tablet Commonly known as: LOTENSIN Take 1 tablet (20 mg total) by mouth daily.   clonazePAM 0.5 MG tablet Commonly known as: [place]  Take 1 tablet (0.5 mg total) by mouth 2 (two) times daily as needed.   COMPLETE MULTIVITAMIN/MINERAL PO Take 1 tablet by mouth daily.   fish oil-omega-3 fatty acids 1000 MG capsule Take 2 g by mouth daily.   hydrochlorothiazide 25 MG tablet Commonly known as: HYDRODIURIL Take 1 tablet (25 mg total) by mouth daily.   meclizine 25 MG tablet  Commonly known as: ANTIVERT TAKE 1/2 TO 1 (ONE-HALF TO ONE) TABLET BY MOUTH THREE TIMES DAILY AS NEEDED FOR DIZZINESS   Melatonin 5 MG Tabs Take 5 mg by mouth at bedtime.   metoprolol tartrate 25 MG tablet Commonly known as: LOPRESSOR Take 0.5 tablets (12.5 mg total) by mouth 2 (two) times daily.   NIFEdipine 30 MG 24 hr tablet Commonly known as: PROCARDIA-XL/NIFEDICAL-XL Take 1 tablet (30 mg total) by mouth daily.   Potassium 99 MG Tabs Take 1 tablet (99 mg total) by mouth daily.   traMADol 50 MG tablet Commonly known as: ULTRAM Take 1 tablet (50 mg total) by mouth 2 (two) times daily.   traZODone 50 MG tablet Commonly known as: DESYREL Take 0.5-1 tablets (25-50 mg total) by mouth at bedtime as needed. for sleep       History (reviewed): Past Medical History:  Diagnosis Date  . Anxiety   . Ascending aortic aneurysm ([place])   . Essential hypertension   . GERD (gastroesophageal reflux disease)   . Gout   . History of kidney stones   . Insomnia    Past Surgical History:  Procedure Laterality Date  . CARDIAC CATHETERIZATION N/A 06/27/2015   Procedure: Left Heart Cath and Coronary Angiography;  Surgeon: Troy Sine, MD;  Location: [facility];  Service: Cardiovascular;  Laterality: N/A;  . HERNIA REPAIR    . KIDNEY STONE SURGERY     Family History  Problem Relation Age of Onset  . Heart disease Father   . Heart attack Father   . Diabetes Sister   . Heart disease Brother   . Deep vein thrombosis Brother   . Congestive Heart Failure Brother   . Cancer Brother   . Leukemia Brother   . Heart attack Brother    Social History   Socioeconomic History  . Marital status: Widowed    Spouse name: Not on file  . Number of children: 2  . Years of education: 10  . Highest education level: 10th grade  Occupational History  . 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 Financial resource strain: Not hard at  all  . Food insecurity    Worry: Never true    Inability: Never true  . Transportation needs    Medical: No    Non-medical: No  Tobacco Use  . Smoking status: Former Smoker    Packs/day: 0.50    Years: 1.00    Pack years: 0.50    Types: Cigarettes    Quit date: 03/22/1958    Years since quitting: 61.4  . Smokeless tobacco: Former Systems developer    Types: Chew  . Tobacco comment: QUIT CHEWING IN 1989  Substance and Sexual Activity  . Alcohol use: No    Alcohol/week: 0.0 standard drinks  . Drug use: No  . Sexual activity: Not Currently  Lifestyle  . Physical activity    Days per week: 5 days    Minutes per session: 60 min  . Stress: Only a little  Relationships  . Social connections  Talks on phone: More than three times a week    Gets together: More than three times a week    Attends religious service: More than 4 times per year    Active member of club or organization: Yes    Attends meetings of clubs or organizations: More than 4 times per year    Relationship status: Widowed  Other Topics Concern  . Not on file  Social History Narrative      Right handed    Caffeine use: none    Activities of Daily Living In your present state of health, do you have any difficulty performing the following activities: 08/05/2019  Hearing? Y  Vision? N  Difficulty concentrating or making decisions? N  Walking or climbing stairs? Y  Dressing or bathing? N  Doing errands, shopping? N  Preparing Food and eating ? N  Using the Toilet? N  In the past six months, have you accidently leaked urine? N  Do you have problems with loss of bowel control? N  Managing your Medications? N  Managing your Finances? N  Housekeeping or managing your Housekeeping? N  Some recent data might be hidden    Patient Education/ Literacy How often do you need to have someone help you when you read instructions, pamphlets, or other written materials from your doctor or pharmacy?: 1 - Never What is the last  grade level you completed in school?: 10th Grade  Exercise Current Exercise Habits: The patient does not participate in regular exercise at present, Exercise limited by: cardiac condition(s);orthopedic condition(s)  Diet Patient reports consuming 3 meals a day and 2 snack(s) a day Patient reports that his primary diet is: Regular Patient reports that she does have regular access to food.   Depression Screen PHQ 2/9 Scores 08/05/2019 01/04/2019 10/01/2018 08/03/2018 06/29/2018 03/29/2018 12/15/2017  PHQ - 2 Score 0 0 0 0 0 0 0  PHQ- 9 Score - - - - - - -     Fall Risk Fall Risk  08/05/2019 01/04/2019 10/01/2018 08/03/2018 06/29/2018  Falls in the past year? 1 0 0 No No  Number falls in past yr: - - 0 - -  Injury with Fall? 0 - 0 - -  Risk for fall due to : Impaired balance/gait;History of fall(s) - - History of fall(s) -     Objective:  Joseph [name] seemed alert and oriented and he participated appropriately during our telephone visit.  Blood Pressure Weight BMI  BP Readings from Last 3 Encounters:  07/05/19 126/70  05/19/19 (!) 150/70  02/17/19 118/65   Wt Readings from Last 3 Encounters:  08/05/19 253 lb 1.4 oz (114.8 kg)  07/05/19 253 lb (114.8 kg)  05/19/19 249 lb (112.9 kg)   BMI Readings from Last 1 Encounters:  08/05/19 38.48 kg/m    *Unable to obtain current vital signs, weight, and BMI due to telephone visit type  Hearing/Vision  . Joseph [name] did  seem to have difficulty with hearing/understanding during the telephone conversation . Reports that he has not had a formal eye exam by an eye care professional within the past year . Reports that he has not had a formal hearing evaluation within the past year *Unable to fully assess hearing and vision during telephone visit type  Cognitive Function: 6CIT Screen 08/05/2019  What Year? 0 points  What month? 0 points  What time? 0 points  Count back from 20 0 points  Months in reverse 4 points  Repeat phrase 0 points  Total Score 4    (Normal:0-7, Significant for Dysfunction: >8)  Normal Cognitive Function Screening: Yes   Immunization & Health Maintenance Record Immunization History  Administered Date(s) Administered  . Influenza, High Dose Seasonal PF 09/11/2017  . Influenza,inj,Quad PF,6+ Mos 10/05/2014, 10/30/2015, 08/28/2016, 10/01/2018  . Pneumococcal Conjugate-13 04/18/2015  . Pneumococcal Polysaccharide-23 01/02/2014, 02/02/2018  . Pneumococcal-Unspecified 02/02/2018  . Tdap 10/05/2014  . Zoster 10/30/2015    Health Maintenance  Topic Date Due  . INFLUENZA VACCINE  07/02/2019  . TETANUS/TDAP  10/05/2024  . PNA vac Low Risk Adult  Completed       Assessment  This is a routine wellness examination for Joseph [name].  Health Maintenance: Due or Overdue Health Maintenance Due  Topic Date Due  . INFLUENZA VACCINE  07/02/2019    Joseph [name] does not need a referral for Community Assistance: Care Management:   no Social Work:    no Prescription Assistance:  no Nutrition/Diabetes Education:  no   Plan:  Personalized Goals Goals Addressed   None    Personalized Health Maintenance & Screening Recommendations  Influenza vaccine  Lung Cancer Screening Recommended: no (Low Dose CT Chest recommended if Age 70-80 years, 30 pack-year currently smoking OR have quit w/in past 15 years) Hepatitis C Screening recommended: no HIV Screening recommended: no  Advanced Directives: Written information was not prepared per patient's request.  Referrals & Orders No orders of the defined types were placed in this encounter.   Follow-up Plan . Follow-up with Chevis Pretty, FNP as planned    I have personally reviewed and noted the following in the patient's chart:   . Medical and social history . Use of alcohol, tobacco or illicit drugs  . Current medications and supplements . Functional ability and status . Nutritional status . Physical activity . Advanced directives . List of other  physicians . Hospitalizations, surgeries, and ER visits in previous 12 months . Vitals . Screenings to include cognitive, depression, and falls . Referrals and appointments  In addition, I have reviewed and discussed with Joseph [name] certain preventive protocols, quality metrics, and best practice recommendations. A written personalized care plan for preventive services as well as general preventive health recommendations is available and can be mailed to the patient at his request.      Joseph Heath, LPN  [id number]   I have reviewed and agree with the above AWV documentation.   Mary-Margaret Hassell Done, FNP

## 2019-08-05 NOTE — Patient Instructions (Signed)
Joseph [name] , Thank you for taking time to come for your Medicare Wellness Visit. I appreciate your ongoing commitment to your health goals. Please review the following plan we discussed and let me know if I can assist you in the future.   These are the goals we discussed: Goals     Exercise 150 min/wk Moderate Activity       This is a list of the screening recommended for you and due dates:  Health Maintenance  Topic Date Due   Flu Shot  07/02/2019   Tetanus Vaccine  10/05/2024   Pneumonia vaccines  Completed     Preventive Care 58 Years and Older, Male Preventive care refers to lifestyle choices and visits with your health care provider that can promote health and wellness. This includes:  A yearly physical exam. This is also called an annual well check.  Regular dental and eye exams.  Immunizations.  Screening for certain conditions.  Healthy lifestyle choices, such as diet and exercise. What can I expect for my preventive care visit? Physical exam Your health care provider will check:  Height and weight. These may be used to calculate body mass index (BMI), which is a measurement that tells if you are at a healthy weight.  Heart rate and blood pressure.  Your skin for abnormal spots. Counseling Your health care provider may ask you questions about:  Alcohol, tobacco, and drug use.  Emotional well-being.  Home and relationship well-being.  Sexual activity.  Eating habits.  History of falls.  Memory and ability to understand (cognition).  Work and work Statistician. What immunizations do I need?  Influenza (flu) vaccine  This is recommended every year. Tetanus, diphtheria, and pertussis (Tdap) vaccine  You may need a Td booster every 10 years. Varicella (chickenpox) vaccine  You may need this vaccine if you have not already been vaccinated. Zoster (shingles) vaccine  You may need this after age 45. Pneumococcal conjugate (PCV13) vaccine  One  dose is recommended after age 63. Pneumococcal polysaccharide (PPSV23) vaccine  One dose is recommended after age 52. Measles, mumps, and rubella (MMR) vaccine  You may need at least one dose of MMR if you were born in 1957 or later. You may also need a second dose. Meningococcal conjugate (MenACWY) vaccine  You may need this if you have certain conditions. Hepatitis A vaccine  You may need this if you have certain conditions or if you travel or work in places where you may be exposed to hepatitis A. Hepatitis B vaccine  You may need this if you have certain conditions or if you travel or work in places where you may be exposed to hepatitis B. Haemophilus influenzae type b (Hib) vaccine  You may need this if you have certain conditions. You may receive vaccines as individual doses or as more than one vaccine together in one shot (combination vaccines). Talk with your health care provider about the risks and benefits of combination vaccines. What tests do I need? Blood tests  Lipid and cholesterol levels. These may be checked every 5 years, or more frequently depending on your overall health.  Hepatitis C test.  Hepatitis B test. Screening  Lung cancer screening. You may have this screening every year starting at age 10 if you have a 30-pack-year history of smoking and currently smoke or have quit within the past 15 years.  Colorectal cancer screening. All adults should have this screening starting at age 68 and continuing until age 21. Your health  care provider may recommend screening at age 66 if you are at increased risk. You will have tests every 1-10 years, depending on your results and the type of screening test.  Prostate cancer screening. Recommendations will vary depending on your family history and other risks.  Diabetes screening. This is done by checking your blood sugar (glucose) after you have not eaten for a while (fasting). You may have this done every 1-3  years.  Abdominal aortic aneurysm (AAA) screening. You may need this if you are a current or former smoker.  Sexually transmitted disease (STD) testing. Follow these instructions at home: Eating and drinking  Eat a diet that includes fresh fruits and vegetables, whole grains, lean protein, and low-fat dairy products. Limit your intake of foods with high amounts of sugar, saturated fats, and salt.  Take vitamin and mineral supplements as recommended by your health care provider.  Do not drink alcohol if your health care provider tells you not to drink.  If you drink alcohol: ? Limit how much you have to 0-2 drinks a day. ? Be aware of how much alcohol is in your drink. In the [place] one drink equals one 12 oz bottle of beer (355 mL), one 5 oz glass of wine (148 mL), or one 1 oz glass of hard liquor (44 mL). Lifestyle  Take daily care of your teeth and gums.  Stay active. Exercise for at least 30 minutes on 5 or more days each week.  Do not use any products that contain nicotine or tobacco, such as cigarettes, e-cigarettes, and chewing tobacco. If you need help quitting, ask your health care provider.  If you are sexually active, practice safe sex. Use a condom or other form of protection to prevent STIs (sexually transmitted infections).  Talk with your health care provider about taking a low-dose aspirin or statin. What's next?  Visit your health care provider once a year for a well check visit.  Ask your health care provider how often you should have your eyes and teeth checked.  Stay up to date on all vaccines. This information is not intended to replace advice given to you by your health care provider. Make sure you discuss any questions you have with your health care provider. Document Released: 12/14/2015 Document Revised: 11/11/2018 Document Reviewed: 11/11/2018 Elsevier Patient Education  2020 [employer].

## 2019-10-05 ENCOUNTER — Ambulatory Visit (INDEPENDENT_AMBULATORY_CARE_PROVIDER_SITE_OTHER): Payer: Medicare HMO | Admitting: Nurse Practitioner

## 2019-10-05 ENCOUNTER — Encounter: Payer: Self-pay | Admitting: Nurse Practitioner

## 2019-10-05 DIAGNOSIS — I712 Thoracic aortic aneurysm, without rupture: Secondary | ICD-10-CM

## 2019-10-05 DIAGNOSIS — I7121 Aneurysm of the ascending aorta, without rupture: Secondary | ICD-10-CM

## 2019-10-05 DIAGNOSIS — E876 Hypokalemia: Secondary | ICD-10-CM

## 2019-10-05 DIAGNOSIS — I10 Essential (primary) hypertension: Secondary | ICD-10-CM

## 2019-10-05 DIAGNOSIS — F411 Generalized anxiety disorder: Secondary | ICD-10-CM

## 2019-10-05 DIAGNOSIS — E785 Hyperlipidemia, unspecified: Secondary | ICD-10-CM

## 2019-10-05 DIAGNOSIS — G629 Polyneuropathy, unspecified: Secondary | ICD-10-CM | POA: Diagnosis not present

## 2019-10-05 DIAGNOSIS — M1A09X0 Idiopathic chronic gout, multiple sites, without tophus (tophi): Secondary | ICD-10-CM | POA: Diagnosis not present

## 2019-10-05 DIAGNOSIS — F5101 Primary insomnia: Secondary | ICD-10-CM | POA: Diagnosis not present

## 2019-10-05 DIAGNOSIS — N400 Enlarged prostate without lower urinary tract symptoms: Secondary | ICD-10-CM

## 2019-10-05 MED ORDER — HYDROCHLOROTHIAZIDE 25 MG PO TABS: 25.0000 mg | ORAL_TABLET | Freq: Every day | ORAL | 1 refills | Status: DC

## 2019-10-05 MED ORDER — ALLOPURINOL 300 MG PO TABS: 300.0000 mg | ORAL_TABLET | Freq: Every day | ORAL | 1 refills | Status: DC

## 2019-10-05 MED ORDER — METOPROLOL TARTRATE 25 MG PO TABS: 12.5000 mg | ORAL_TABLET | Freq: Two times a day (BID) | ORAL | 3 refills | Status: DC

## 2019-10-05 MED ORDER — POTASSIUM 99 MG PO TABS: 1.0000 | ORAL_TABLET | Freq: Every day | ORAL | 1 refills | Status: DC

## 2019-10-05 MED ORDER — TRAZODONE HCL 50 MG PO TABS: 25.0000 mg | ORAL_TABLET | Freq: Every evening | ORAL | 5 refills | Status: DC | PRN

## 2019-10-05 MED ORDER — NIFEDIPINE ER OSMOTIC RELEASE 30 MG PO TB24: 30.0000 mg | ORAL_TABLET | Freq: Every day | ORAL | 1 refills | Status: DC

## 2019-10-05 MED ORDER — CLONAZEPAM 0.5 MG PO TABS: 0.5000 mg | ORAL_TABLET | Freq: Two times a day (BID) | ORAL | 2 refills | Status: DC | PRN

## 2019-10-05 MED ORDER — BENAZEPRIL HCL 20 MG PO TABS: 20.0000 mg | ORAL_TABLET | Freq: Every day | ORAL | 1 refills | Status: DC

## 2019-10-05 MED ORDER — TRAMADOL HCL 50 MG PO TABS: 50.0000 mg | ORAL_TABLET | Freq: Two times a day (BID) | ORAL | 2 refills | Status: DC

## 2019-10-05 MED ORDER — ATORVASTATIN CALCIUM 40 MG PO TABS: 40.0000 mg | ORAL_TABLET | Freq: Every day | ORAL | 1 refills | Status: DC

## 2019-10-05 NOTE — Progress Notes (Signed)
Virtual Visit via telephone Note Due to COVID-19 pandemic this visit was conducted virtually. This visit type was conducted due to national recommendations for restrictions regarding the COVID-19 Pandemic (e.g. social distancing, sheltering in place) in an effort to limit this patient's exposure and mitigate transmission in our community. All issues noted in this document were discussed and addressed.  A physical exam was not performed with this format.  I connected with Joseph [name] on 10/05/19 at 11:15 by telephone and verified that I am speaking with the correct person using two identifiers. Joseph [name] is currently located at home and his girlfriend is currently with him during visit. The provider, Mary-Margaret Hassell Done, FNP is located in their office at time of visit.  I discussed the limitations, risks, security and privacy concerns of performing an evaluation and management service by telephone and the availability of in person appointments. I also discussed with the patient that there may be a patient responsible charge related to this service. The patient expressed understanding and agreed to proceed.   History and Present Illness:   Chief Complaint: Medical Management of Chronic Issues    HPI:  1. Essential hypertension, benign No c/o chest pain, sob or headache. Does not check blood pressure at home. BP Readings from Last 3 Encounters:  07/05/19 126/70  05/19/19 (!) 150/70  02/17/19 118/65     2. Hyperlipidemia with target LDL less than 100 Does not watch diet and does not exercise. He does try to stay active. Is on daily lipitor. Lab Results  Component Value Date   CHOL 189 07/05/2019   HDL 33 (L) 07/05/2019   LDLCALC 115 (H) 07/05/2019   TRIG 203 (H) 07/05/2019   CHOLHDL 5.7 (H) 07/05/2019     3. Hypokalemia Takes a daily vitamin supplement. No c/o lower ext cramping Lab Results  Component Value Date   K 4.2 07/05/2019     4. Thoracic ascending aortic  aneurysm ([place]) Had CT scan in 07/2019 and there was no change. Will rechek in 1 year because insurcnce would not approve semi annual.  5. Polyneuropathy Has constant burning in feet. Worse at night. He does not go barefooted. Takes ultram BID for foot pain an dthat really helps him to stay active.  6. Benign prostatic hyperplasia without lower urinary tract symptoms no problems voiding  7. Idiopathic chronic gout of multiple sites without tophus No recent flare ups  8. Primary insomnia Takes trazadone and that helps him sleep most nights  9. Severe obesity (BMI >= 40) (HCC) No recent weight changes Wt Readings from Last 3 Encounters:  08/05/19 253 lb 1.4 oz (114.8 kg)  07/05/19 253 lb (114.8 kg)  05/19/19 249 lb (112.9 kg)   BMI Readings from Last 3 Encounters:  08/05/19 38.48 kg/m  07/05/19 38.47 kg/m  05/19/19 37.86 kg/m       Outpatient Encounter Medications as of 10/05/2019  Medication Sig  . allopurinol (ZYLOPRIM) 300 MG tablet Take 1 tablet (300 mg total) by mouth daily.  Marland Kitchen atorvastatin (LIPITOR) 40 MG tablet Take 1 tablet (40 mg total) by mouth daily.  . benazepril (LOTENSIN) 20 MG tablet Take 1 tablet (20 mg total) by mouth daily.  . clonazePAM (KLONOPIN) 0.5 MG tablet Take 1 tablet (0.5 mg total) by mouth 2 (two) times daily as needed.  . fish oil-omega-3 fatty acids 1000 MG capsule Take 2 g by mouth daily.  . hydrochlorothiazide (HYDRODIURIL) 25 MG tablet Take 1 tablet (25 mg total) by mouth  daily.  . meclizine (ANTIVERT) 25 MG tablet TAKE 1/2 TO 1 (ONE-HALF TO ONE) TABLET BY MOUTH THREE TIMES DAILY AS NEEDED FOR DIZZINESS  . Melatonin 5 MG TABS Take 5 mg by mouth at bedtime.  . metoprolol tartrate (LOPRESSOR) 25 MG tablet Take 0.5 tablets (12.5 mg total) by mouth 2 (two) times daily.  . Multiple Vitamins-Minerals (COMPLETE MULTIVITAMIN/MINERAL PO) Take 1 tablet by mouth daily.  Marland Kitchen NIFEdipine (PROCARDIA-XL/NIFEDICAL-XL) 30 MG 24 hr tablet Take 1 tablet (30 mg  total) by mouth daily.  . Potassium 99 MG TABS Take 1 tablet (99 mg total) by mouth daily.  . traMADol (ULTRAM) 50 MG tablet Take 1 tablet (50 mg total) by mouth 2 (two) times daily.  . traZODone (DESYREL) 50 MG tablet Take 0.5-1 tablets (25-50 mg total) by mouth at bedtime as needed. for sleep     Past Surgical History:  Procedure Laterality Date  . CARDIAC CATHETERIZATION N/A 06/27/2015   Procedure: Left Heart Cath and Coronary Angiography;  Surgeon: Troy Sine, MD;  Location: [facility];  Service: Cardiovascular;  Laterality: N/A;  . HERNIA REPAIR    . KIDNEY STONE SURGERY      Family History  Problem Relation Age of Onset  . Heart disease Father   . Heart attack Father   . Diabetes Sister   . Heart disease Brother   . Deep vein thrombosis Brother   . Congestive Heart Failure Brother   . Cancer Brother   . Leukemia Brother   . Heart attack Brother     New complaints: None today  Social history: Wife died 2 years ago and he has a new lady in his life that he spends a lot of time with.  Controlled substance contract: 01/04/19     Review of Systems  Constitutional: Negative for diaphoresis and weight loss.  Eyes: Negative for blurred vision, double vision and pain.  Respiratory: Negative for shortness of breath.   Cardiovascular: Negative for chest pain, palpitations, orthopnea and leg swelling.  Gastrointestinal: Negative for abdominal pain.  Skin: Negative for rash.  Neurological: Negative for dizziness, sensory change, loss of consciousness, weakness and headaches.  Endo/Heme/Allergies: Negative for polydipsia. Does not bruise/bleed easily.  Psychiatric/Behavioral: Negative for memory loss. The patient does not have insomnia.   All other systems reviewed and are negative.    Observations/Objective: Alert and oriented- answers all questions appropriately No distress     Assessment and Plan: Joseph [name] comes in today with chief complaint of  Medical Management of Chronic Issues   Diagnosis and orders addressed:  1. Essential hypertension, benign Low sodium diet - hydrochlorothiazide (HYDRODIURIL) 25 MG tablet; Take 1 tablet (25 mg total) by mouth daily.  Dispense: 90 tablet; Refill: 1 - benazepril (LOTENSIN) 20 MG tablet; Take 1 tablet (20 mg total) by mouth daily.  Dispense: 90 tablet; Refill: 1 - metoprolol tartrate (LOPRESSOR) 25 MG tablet; Take 0.5 tablets (12.5 mg total) by mouth 2 (two) times daily.  Dispense: 90 tablet; Refill: 3  2. Hyperlipidemia with target LDL less than 100 Low fat diet - atorvastatin (LIPITOR) 40 MG tablet; Take 1 tablet (40 mg total) by mouth daily.  Dispense: 90 tablet; Refill: 1  3. Hypokalemia Continue daily potassium supplement - Potassium 99 MG TABS; Take 1 tablet (99 mg total) by mouth daily.  Dispense: 90 tablet; Refill: 1  4. Thoracic ascending aortic aneurysm (HCC) - NIFEdipine (PROCARDIA-XL/NIFEDICAL-XL) 30 MG 24 hr tablet; Take 1 tablet (30 mg total) by  mouth daily.  Dispense: 90 tablet; Refill: 1  5. Polyneuropathy Do not o barefooted - traMADol (ULTRAM) 50 MG tablet; Take 1 tablet (50 mg total) by mouth 2 (two) times daily.  Dispense: 60 tablet; Refill: 2  6. Benign prostatic hyperplasia without lower urinary tract symptoms  7. Idiopathic chronic gout of multiple sites without tophus Low purine diet - allopurinol (ZYLOPRIM) 300 MG tablet; Take 1 tablet (300 mg total) by mouth daily.  Dispense: 90 tablet; Refill: 1  8. Primary insomnia bedtime routine - traZODone (DESYREL) 50 MG tablet; Take 0.5-1 tablets (25-50 mg total) by mouth at bedtime as needed. for sleep  Dispense: 30 tablet; Refill: 5  9. Severe obesity (BMI >= 40) (HCC) Discussed diet and exercise for person with BMI >25 Will recheck weight in 3-6 months   10. GAD (generalized anxiety disorder) stres management - clonazePAM (KLONOPIN) 0.5 MG tablet; Take 1 tablet (0.5 mg total) by mouth 2 (two) times daily as  needed.  Dispense: 60 tablet; Refill: 2   Previous labs reviewed Health Maintenance reviewed Diet and exercise encouraged  Follow up plan: 3 months     I discussed the assessment and treatment plan with the patient. The patient was provided an opportunity to ask questions and all were answered. The patient agreed with the plan and demonstrated an understanding of the instructions.   The patient was advised to call back or seek an in-person evaluation if the symptoms worsen or if the condition fails to improve as anticipated.  The above assessment and management plan was discussed with the patient. The patient verbalized understanding of and has agreed to the management plan. Patient is aware to call the clinic if symptoms persist or worsen. Patient is aware when to return to the clinic for a follow-up visit. Patient educated on when it is appropriate to go to the emergency department.   Time call ended:  11:32 I provided 17 minutes of non-face-to-face time during this encounter.    Mary-Margaret Hassell Done, FNP

## 2019-10-06 ENCOUNTER — Telehealth: Payer: Self-pay | Admitting: Nurse Practitioner

## 2019-10-06 NOTE — Telephone Encounter (Signed)
Form on provider's desk for review.

## 2019-11-09 ENCOUNTER — Telehealth: Payer: Self-pay | Admitting: Cardiology

## 2019-11-09 NOTE — Telephone Encounter (Signed)
Virtual Visit Pre-Appointment Phone Call  "(Name), I am calling you today to discuss your upcoming appointment. We are currently trying to limit exposure to the virus that causes COVID-19 by seeing patients at home rather than in the office."  1. "What is the BEST phone number to call the day of the visit?" - include this in appointment notes  2. Do you have or have access to (through a family member/friend) a smartphone with video capability that we can use for your visit?" a. If yes - list this number in appt notes as cell (if different from BEST phone #) and list the appointment type as a VIDEO visit in appointment notes b. If no - list the appointment type as a PHONE visit in appointment notes  3. Confirm consent - "In the setting of the current Covid19 crisis, you are scheduled for a (phone or video) visit with your provider on (date) at (time).  Just as we do with many in-office visits, in order for you to participate in this visit, we must obtain consent.  If you'd like, I can send this to your mychart (if signed up) or email for you to review.  Otherwise, I can obtain your verbal consent now.  All virtual visits are billed to your insurance company just like a normal visit would be.  By agreeing to a virtual visit, we'd like you to understand that the technology does not allow for your provider to perform an examination, and thus may limit your provider's ability to fully assess your condition. If your provider identifies any concerns that need to be evaluated in person, we will make arrangements to do so.  Finally, though the technology is pretty good, we cannot assure that it will always work on either your or our end, and in the setting of a video visit, we may have to convert it to a phone-only visit.  In either situation, we cannot ensure that we have a secure connection.  Are you willing to proceed?" STAFF: Did the patient verbally acknowledge consent to telehealth visit? Document  YES/NO here: YES  4. Advise patient to be prepared - "Two hours prior to your appointment, go ahead and check your blood pressure, pulse, oxygen saturation, and your weight (if you have the equipment to check those) and write them all down. When your visit starts, your provider will ask you for this information. If you have an Apple Watch or Kardia device, please plan to have heart rate information ready on the day of your appointment. Please have a pen and paper handy nearby the day of the visit as well."  5. Give patient instructions for MyChart download to smartphone OR Doximity/[email or web address] as below if video visit (depending on what platform provider is using)  6. Inform patient they will receive a phone call 15 minutes prior to their appointment time (may be from unknown caller ID) so they should be prepared to answer    TELEPHONE CALL NOTE  Joseph [name] has been deemed a candidate for a follow-up tele-health visit to limit community exposure during the Covid-19 pandemic. I spoke with the patient via phone to ensure availability of phone/video source, confirm preferred email & phone number, and discuss instructions and expectations.  I reminded Joseph [name] to be prepared with any vital sign and/or heart rhythm information that could potentially be obtained via home monitoring, at the time of his visit. I reminded Joseph [name] to expect a phone call prior to  his visit.  Joseph [name] 11/09/2019 1:40 PM   INSTRUCTIONS FOR DOWNLOADING THE MYCHART APP TO SMARTPHONE  - The patient must first make sure to have activated MyChart and know their login information - If Apple, go to [employer] and type in MyChart in the search bar and download the app. If Android, ask patient to go to [employer] and type in [facility] in the search bar and download the app. The app is free but as with any other app downloads, their phone may require them to verify saved payment information or Apple/Android  password.  - The patient will need to then log into the app with their MyChart username and password, and select [facility] as their healthcare provider to link the account. When it is time for your visit, go to the MyChart app, find appointments, and click Begin Video Visit. Be sure to Select Allow for your device to access the Microphone and Camera for your visit. You will then be connected, and your provider will be with you shortly.  **If they have any issues connecting, or need assistance please contact MyChart service desk [phone])**  **If using a computer, in order to ensure the best quality for their visit they will need to use either of the following Internet Browsers: [employer], or Google Chrome**  IF USING DOXIMITY or DOXY.ME - The patient will receive a link just prior to their visit by text.     FULL LENGTH CONSENT FOR TELE-HEALTH VISIT   I hereby voluntarily request, consent and authorize [facility] and its employed or contracted physicians, physician assistants, nurse practitioners or other licensed health care professionals (the Practitioner), to provide me with telemedicine health care services (the Services") as deemed necessary by the treating Practitioner. I acknowledge and consent to receive the Services by the Practitioner via telemedicine. I understand that the telemedicine visit will involve communicating with the Practitioner through live audiovisual communication technology and the disclosure of certain medical information by electronic transmission. I acknowledge that I have been given the opportunity to request an in-person assessment or other available alternative prior to the telemedicine visit and am voluntarily participating in the telemedicine visit.  I understand that I have the right to withhold or withdraw my consent to the use of telemedicine in the course of my care at any time, without affecting my right to future care or treatment,  and that the Practitioner or I may terminate the telemedicine visit at any time. I understand that I have the right to inspect all information obtained and/or recorded in the course of the telemedicine visit and may receive copies of available information for a reasonable fee.  I understand that some of the potential risks of receiving the Services via telemedicine include:   Delay or interruption in medical evaluation due to technological equipment failure or disruption;  Information transmitted may not be sufficient (e.g. poor resolution of images) to allow for appropriate medical decision making by the Practitioner; and/or   In rare instances, security protocols could fail, causing a breach of personal health information.  Furthermore, I acknowledge that it is my responsibility to provide information about my medical history, conditions and care that is complete and accurate to the best of my ability. I acknowledge that Practitioner's advice, recommendations, and/or decision may be based on factors not within their control, such as incomplete or inaccurate data provided by me or distortions of diagnostic images or specimens that may result from electronic transmissions. I  understand that the practice of medicine is not an exact science and that Practitioner makes no warranties or guarantees regarding treatment outcomes. I acknowledge that I will receive a copy of this consent concurrently upon execution via email to the email address I last provided but may also request a printed copy by calling the office of [facility].    I understand that my insurance will be billed for this visit.   I have read or had this consent read to me.  I understand the contents of this consent, which adequately explains the benefits and risks of the Services being provided via telemedicine.   I have been provided ample opportunity to ask questions regarding this consent and the Services and have had my questions  answered to my satisfaction.  I give my informed consent for the services to be provided through the use of telemedicine in my medical care  By participating in this telemedicine visit I agree to the above.

## 2019-11-27 ENCOUNTER — Encounter: Payer: Self-pay | Admitting: Cardiology

## 2019-11-27 NOTE — Progress Notes (Signed)
Virtual Visit via Telephone Note   This visit type was conducted due to national recommendations for restrictions regarding the COVID-19 Pandemic (e.g. social distancing) in an effort to limit this patient's exposure and mitigate transmission in our community.  Due to his co-morbid illnesses, this patient is at least at moderate risk for complications without adequate follow up.  This format is felt to be most appropriate for this patient at this time.  The patient did not have access to video technology/had technical difficulties with video requiring transitioning to audio format only (telephone).  All issues noted in this document were discussed and addressed.  No physical exam could be performed with this format.  Please refer to the patient's chart for his  consent to telehealth for [facility]   Date:  11/28/2019   ID:  Joseph [name], DOB [birth year], MRN [id number]  Patient Location: Home Provider Location: Office  PCP:  Chevis Pretty, FNP  Cardiologist:  Rozann Lesches, MD Electrophysiologist:  None   Evaluation Performed:  Follow-Up Visit  Chief Complaint: Cardiac follow-up  History of Present Illness:    Joseph [name] is a 78 y.o. male last seen in September 2019.  We spoke by phone today.  He does not report any angina symptoms or unusual shortness of breath with typical activities.  He and his significant other have been staying away from crowds, he states that he wears a mask when he goes out.  Follow-up chest CTA per Dr. Servando Snare back in June 2020 showed a stable ascending thoracic aortic aneurysm measuring 4.8 cm.  This is being followed annually.  He is asymptomatic.  I reviewed follow-up lab work from August 2020 as outlined below.  LDL was 115 at that time, down from 139.  He had been on Lipitor 40 mg daily, but stopped this several months ago reporting leg pain.  I talked with him about considering low-dose Crestor as an option.  The patient does not have  symptoms concerning for COVID-19 infection (fever, chills, cough, or new shortness of breath).    Past Medical History:  Diagnosis Date  . Anxiety   . Ascending aortic aneurysm ([place])   . Essential hypertension   . GERD (gastroesophageal reflux disease)   . Gout   . History of kidney stones   . Insomnia    Past Surgical History:  Procedure Laterality Date  . CARDIAC CATHETERIZATION N/A 06/27/2015   Procedure: Left Heart Cath and Coronary Angiography;  Surgeon: Troy Sine, MD;  Location: [place] [facility];  Service: Cardiovascular;  Laterality: N/A;  . HERNIA REPAIR    . KIDNEY STONE SURGERY       Current Meds  Medication Sig  . allopurinol (ZYLOPRIM) 300 MG tablet Take 1 tablet (300 mg total) by mouth daily.  . benazepril (LOTENSIN) 20 MG tablet Take 1 tablet (20 mg total) by mouth daily.  . clonazePAM (KLONOPIN) 0.5 MG tablet Take 1 tablet (0.5 mg total) by mouth 2 (two) times daily as needed.  . fish oil-omega-3 fatty acids 1000 MG capsule Take 2 g by mouth daily.  . hydrochlorothiazide (HYDRODIURIL) 25 MG tablet Take 1 tablet (25 mg total) by mouth daily.  . meclizine (ANTIVERT) 25 MG tablet TAKE 1/2 TO 1 (ONE-HALF TO ONE) TABLET BY MOUTH THREE TIMES DAILY AS NEEDED FOR DIZZINESS  . Melatonin 5 MG TABS Take 5 mg by mouth at bedtime.  . metoprolol tartrate (LOPRESSOR) 25 MG tablet Take 0.5 tablets (12.5 mg total) by mouth  2 (two) times daily.  . Multiple Vitamins-Minerals (COMPLETE MULTIVITAMIN/MINERAL PO) Take 1 tablet by mouth daily.  Marland Kitchen NIFEdipine (PROCARDIA-XL/NIFEDICAL-XL) 30 MG 24 hr tablet Take 1 tablet (30 mg total) by mouth daily.  . Potassium 99 MG TABS Take 1 tablet (99 mg total) by mouth daily.  . traMADol (ULTRAM) 50 MG tablet Take 1 tablet (50 mg total) by mouth 2 (two) times daily.  . traZODone (DESYREL) 50 MG tablet Take 0.5-1 tablets (25-50 mg total) by mouth at bedtime as needed. for sleep     Allergies:   Carafate [sucralfate], Ciprofloxacin hcl, Prevacid  [lansoprazole], Quinolones, and Sulfa antibiotics    Social History   Tobacco Use  . Smoking status: Former Smoker    Packs/day: 0.50    Years: 1.00    Pack years: 0.50    Types: Cigarettes    Quit date: 03/22/1958    Years since quitting: 61.7  . Smokeless tobacco: Former Systems developer    Types: Chew  . Tobacco comment: QUIT CHEWING IN 1989  Substance Use Topics  . Alcohol use: No    Alcohol/week: 0.0 standard drinks  . Drug use: No     Family Hx: The patient's family history includes Cancer in his brother; Congestive Heart Failure in his brother; Deep vein thrombosis in his brother; Diabetes in his sister; Heart attack in his brother and father; Heart disease in his brother and father; Leukemia in his brother.  ROS:   Please see the history of present illness. All other systems reviewed and are negative.   Prior CV studies:   The following studies were reviewed today:  Chest CTA 05/19/2019: IMPRESSION: 1. The ascending thoracic aortic aneurysm measures 4.8 cm in greatest dimension, unchanged. Ascending thoracic aortic aneurysm. Recommend semi-annual imaging followup by CTA or MRA and referral to cardiothoracic surgery if not already obtained. This recommendation follows 2010 ACCF/AHA/AATS/ACR/ASA/SCA/SCAI/SIR/STS/SVM Guidelines for the Diagnosis and Management of Patients With Thoracic Aortic Disease. Circulation. 2010; [id number]. Aortic aneurysm NOS (ICD10-I71.9) 2. Atherosclerotic changes in the thoracic aorta. Coronary artery calcifications. 3. Hepatic cysts. 4. Mildly complicated renal cyst comparing to previous studies.  Aortic Atherosclerosis (ICD10-I70.0).   Cardiac catheterization 06/27/2015:  Mid LAD lesion, 25% stenosed.  Mid RCA lesion, 35% stenosed.  There is mild left ventricular systolic dysfunction.  Mild LV dysfunction with an ejection fraction of 45-50% with a subtle region of mild mid inferior hypocontractility.  Mild nonobstructive 2 vessel  CAD with 25% smooth stenosis in the LAD immediately after the takeoff of the first diagonal vessel; normal left circumflex artery; dominant right coronary artery with smooth 30-40% mid stenosis.  Labs/Other Tests and Data Reviewed:     EKG:  An ECG dated 08/30/2018 was personally reviewed today and demonstrated:  Sinus rhythm with leftward axis and nonspecific ST-T changes.  Recent Labs: 02/17/2019: TSH 1.540 07/05/2019: ALT 21; BUN 16; Creatinine, Ser 0.94; Potassium 4.2; Sodium 142   Recent Lipid Panel Lab Results  Component Value Date/Time   CHOL 189 07/05/2019 12:27 PM   CHOL 208 (H) 03/22/2013 09:28 AM   TRIG 203 (H) 07/05/2019 12:27 PM   TRIG 142 04/18/2015 09:42 AM   TRIG 163 (H) 03/22/2013 09:28 AM   HDL 33 (L) 07/05/2019 12:27 PM   HDL 43 04/18/2015 09:42 AM   HDL 39 (L) 03/22/2013 09:28 AM   CHOLHDL 5.7 (H) 07/05/2019 12:27 PM   LDLCALC 115 (H) 07/05/2019 12:27 PM   LDLCALC 65 07/10/2014 09:36 AM   LDLCALC 136 (H) 03/22/2013 09:28 AM  Wt Readings from Last 3 Encounters:  11/28/19 254 lb (115.2 kg)  08/05/19 253 lb 1.4 oz (114.8 kg)  07/05/19 253 lb (114.8 kg)     Objective:    Vital Signs:  Ht 5\' 8"  (1.727 m)   Wt 254 lb (115.2 kg)   BMI 38.62 kg/m    He did not have a way to check vital signs today. Patient spoke in full sentences, not short of breath at rest. No audible wheezing or coughing.  ASSESSMENT & PLAN:    1.  Ascending thoracic aortic aneurysm, stable at 4.8 cm by chest CT in June 2020.  He continues to follow with Dr. Servando Snare and will undergo repeat imaging summer 2021.  He is asymptomatic.  2.  Mild coronary atherosclerosis by cardiac catheterization in 2016.  He does not report any angina or increasing dyspnea on exertion with typical activities. Continue observation, discussed resumption of statin therapy.  3.  Essential hypertension, he continues on multimodal therapy including Lotensin, HCTZ, Lopressor, and Procardia XL.  Keep follow-up with  PCP.  4.  Mixed hyperlipidemia, last LDL 115 in August 2020.  He reports intolerance to Lipitor.  We will try Crestor 5 mg daily.  If tolerates, check FLP and LFTs in 3 months.  COVID-19 Education: The signs and symptoms of COVID-19 were discussed with the patient and how to seek care for testing (follow up with PCP or arrange E-visit).  The importance of social distancing was discussed today.  Time:   Today, I have spent 10 minutes with the patient with telehealth technology discussing the above problems.     Medication Adjustments/Labs and Tests Ordered: Current medicines are reviewed at length with the patient today.  Concerns regarding medicines are outlined above.   Tests Ordered: No orders of the defined types were placed in this encounter.   Medication Changes: Meds ordered this encounter  Medications  . rosuvastatin (CRESTOR) 5 MG tablet    Sig: Take 1 tablet (5 mg total) by mouth daily.    Dispense:  30 tablet    Refill:  11    New 11/28/2019    Follow Up:  In Person 1 year in the [place] office.   Signed, Rozann Lesches, MD  11/28/2019 8:46 AM    [place]

## 2019-11-28 ENCOUNTER — Encounter: Payer: Self-pay | Admitting: Cardiology

## 2019-11-28 ENCOUNTER — Telehealth (INDEPENDENT_AMBULATORY_CARE_PROVIDER_SITE_OTHER): Payer: Medicare HMO | Admitting: Cardiology

## 2019-11-28 VITALS — Ht 68.0 in | Wt 254.0 lb

## 2019-11-28 DIAGNOSIS — I7121 Aneurysm of the ascending aorta, without rupture: Secondary | ICD-10-CM

## 2019-11-28 DIAGNOSIS — I712 Thoracic aortic aneurysm, without rupture: Secondary | ICD-10-CM

## 2019-11-28 DIAGNOSIS — I2510 Atherosclerotic heart disease of native coronary artery without angina pectoris: Secondary | ICD-10-CM | POA: Diagnosis not present

## 2019-11-28 DIAGNOSIS — I10 Essential (primary) hypertension: Secondary | ICD-10-CM | POA: Diagnosis not present

## 2019-11-28 DIAGNOSIS — E782 Mixed hyperlipidemia: Secondary | ICD-10-CM | POA: Diagnosis not present

## 2019-11-28 MED ORDER — ROSUVASTATIN CALCIUM 5 MG PO TABS: 5.0000 mg | ORAL_TABLET | Freq: Every day | ORAL | 11 refills | Status: DC

## 2019-11-28 NOTE — Patient Instructions (Signed)
Medication Instructions:   Begin Crestor 5mg  daily - new prescription sent to pharmacy today.   Continue all other medications.    Labwork: Lipids & Liver function - due in 3 months - if able to tolerate the Crestor.   Testing/Procedures: none  Follow-Up: Your physician wants you to follow up in:  1 year.  You will receive a reminder letter in the mail one-two months in advance.  If you don't receive a letter, please call our office to schedule the follow up appointment   Any Other Special Instructions Will Be Listed Below (If Applicable).  If you need a refill on your cardiac medications before your next appointment, please call your pharmacy.

## 2019-12-20 DIAGNOSIS — Z23 Encounter for immunization: Secondary | ICD-10-CM | POA: Diagnosis not present

## 2020-01-05 ENCOUNTER — Other Ambulatory Visit: Payer: Self-pay

## 2020-01-05 ENCOUNTER — Telehealth: Payer: Self-pay | Admitting: Nurse Practitioner

## 2020-01-06 ENCOUNTER — Ambulatory Visit (INDEPENDENT_AMBULATORY_CARE_PROVIDER_SITE_OTHER): Payer: Medicare HMO | Admitting: Nurse Practitioner

## 2020-01-06 ENCOUNTER — Encounter: Payer: Self-pay | Admitting: Nurse Practitioner

## 2020-01-06 VITALS — BP 114/70 | HR 79 | Temp 98.7°F | Resp 20 | Ht 68.0 in | Wt 257.0 lb

## 2020-01-06 DIAGNOSIS — I10 Essential (primary) hypertension: Secondary | ICD-10-CM | POA: Diagnosis not present

## 2020-01-06 DIAGNOSIS — E876 Hypokalemia: Secondary | ICD-10-CM

## 2020-01-06 DIAGNOSIS — R9439 Abnormal result of other cardiovascular function study: Secondary | ICD-10-CM

## 2020-01-06 DIAGNOSIS — M1A09X0 Idiopathic chronic gout, multiple sites, without tophus (tophi): Secondary | ICD-10-CM

## 2020-01-06 DIAGNOSIS — E785 Hyperlipidemia, unspecified: Secondary | ICD-10-CM | POA: Diagnosis not present

## 2020-01-06 DIAGNOSIS — N400 Enlarged prostate without lower urinary tract symptoms: Secondary | ICD-10-CM

## 2020-01-06 DIAGNOSIS — G629 Polyneuropathy, unspecified: Secondary | ICD-10-CM

## 2020-01-06 DIAGNOSIS — F5101 Primary insomnia: Secondary | ICD-10-CM

## 2020-01-06 DIAGNOSIS — I712 Thoracic aortic aneurysm, without rupture: Secondary | ICD-10-CM

## 2020-01-06 DIAGNOSIS — I7300 Raynaud's syndrome without gangrene: Secondary | ICD-10-CM | POA: Diagnosis not present

## 2020-01-06 DIAGNOSIS — I7121 Aneurysm of the ascending aorta, without rupture: Secondary | ICD-10-CM

## 2020-01-06 DIAGNOSIS — F411 Generalized anxiety disorder: Secondary | ICD-10-CM

## 2020-01-06 MED ORDER — HYDROCHLOROTHIAZIDE 25 MG PO TABS: 25.0000 mg | ORAL_TABLET | Freq: Every day | ORAL | 1 refills | Status: DC

## 2020-01-06 MED ORDER — ALLOPURINOL 300 MG PO TABS: 300.0000 mg | ORAL_TABLET | Freq: Every day | ORAL | 1 refills | Status: DC

## 2020-01-06 MED ORDER — BENAZEPRIL HCL 20 MG PO TABS: 20.0000 mg | ORAL_TABLET | Freq: Every day | ORAL | 1 refills | Status: DC

## 2020-01-06 MED ORDER — TRAMADOL HCL 50 MG PO TABS: 50.0000 mg | ORAL_TABLET | Freq: Two times a day (BID) | ORAL | 2 refills | Status: DC

## 2020-01-06 MED ORDER — CLONAZEPAM 0.5 MG PO TABS: 0.5000 mg | ORAL_TABLET | Freq: Two times a day (BID) | ORAL | 2 refills | Status: DC | PRN

## 2020-01-06 MED ORDER — POTASSIUM 99 MG PO TABS: 1.0000 | ORAL_TABLET | Freq: Every day | ORAL | 1 refills | Status: DC

## 2020-01-06 MED ORDER — METOPROLOL TARTRATE 25 MG PO TABS: 12.5000 mg | ORAL_TABLET | Freq: Two times a day (BID) | ORAL | 3 refills | Status: DC

## 2020-01-06 MED ORDER — ROSUVASTATIN CALCIUM 5 MG PO TABS: 5.0000 mg | ORAL_TABLET | Freq: Every day | ORAL | 11 refills | Status: DC

## 2020-01-06 MED ORDER — NIFEDIPINE ER OSMOTIC RELEASE 30 MG PO TB24: 30.0000 mg | ORAL_TABLET | Freq: Every day | ORAL | 1 refills | Status: DC

## 2020-01-06 MED ORDER — TRAZODONE HCL 50 MG PO TABS: 25.0000 mg | ORAL_TABLET | Freq: Every evening | ORAL | 5 refills | Status: DC | PRN

## 2020-01-06 NOTE — Progress Notes (Signed)
Subjective:    Patient ID: Joseph [name], male    DOB: Jul 02, 1941, 79 y.o.   MRN: [id number]   Chief Complaint: Medical Management of Chronic Issues    HPI:  1. Essential hypertension, benign Sometimes checks BP at home and is good when he checks it. Denies chest pain, SOB, headaches, dizziness. BP Readings from Last 3 Encounters:  01/06/20 114/70  07/05/19 126/70  05/19/19 (!) 150/70     2. Raynaud's disease without gangrene Fingers slightly cyanotic. Reports they get white when cold and reports numbness and tingling.  3. Thoracic ascending aortic aneurysm [facility]) Saw cardiologist in December. No changes in status.  4. Polyneuropathy Electromyography performed. Numbness and tingling of hands and feet all the time. Wanted to refer him to muscular dr but patient declined.   5. Benign prostatic hyperplasia without lower urinary tract symptoms Reports some dribbling and problems maintaining stream at times. Sees urologist.  Lab Results  Component Value Date   PSA1 3.3 06/29/2018   PSA1 3.0 02/20/2017   PSA 3.3 01/08/2015   PSA 3.4 10/05/2014   PSA 2.9 06/13/2014      6. Hyperlipidemia with target LDL less than 100 Takes cholesterol pill as ordered. Reports no side effects with 5 mg dose. Does not diet. Does some walking to stay active and some exercises. Lab Results  Component Value Date   CHOL 189 07/05/2019   HDL 33 (L) 07/05/2019   LDLCALC 115 (H) 07/05/2019   TRIG 203 (H) 07/05/2019   CHOLHDL 5.7 (H) 07/05/2019     7. Primary insomnia Sometimes has trouble falling asleep but is able to sleep well once he is sleeping. Takes desyrel daily and reports it does help him sleep.  8. Idiopathic chronic gout of multiple sites without tophus Takes allopurinol at night. Has had no recent flare up.  9. Severe obesity (BMI >= 40) (HCC) No significant changes in weight.  BMI Readings from Last 3 Encounters:  01/06/20 39.08 kg/m  11/28/19 38.62 kg/m  08/05/19 38.48  kg/m   Wt Readings from Last 3 Encounters:  01/06/20 257 lb (116.6 kg)  11/28/19 254 lb (115.2 kg)  08/05/19 253 lb 1.4 oz (114.8 kg)    10. Abnormal myocardial perfusion study Sees cardiologist. Last visit in December 2020 with no changes or concerns.  11. Hypokalemia Takes K pill daily. Lab Results  Component Value Date   K 4.2 07/05/2019   12. GAD (generalized anxiety disorder) Denies increased anxiety.  GAD 7 : Generalized Anxiety Score 01/06/2020 10/20/2016 10/30/2015  Nervous, Anxious, on Edge 0 0 1  Control/stop worrying 0 0 0  Worry too much - different things 0 0 1  Trouble relaxing 0 0 1  Restless 0 0 1  Easily annoyed or irritable 1 0 1  Afraid - awful might happen 0 0 0  Total GAD 7 Score 1 0 5  Anxiety Difficulty Not difficult at all Not difficult at all Somewhat difficult        Outpatient Encounter Medications as of 01/06/2020  Medication Sig  . allopurinol (ZYLOPRIM) 300 MG tablet Take 1 tablet (300 mg total) by mouth daily.  . benazepril (LOTENSIN) 20 MG tablet Take 1 tablet (20 mg total) by mouth daily.  . clonazePAM (KLONOPIN) 0.5 MG tablet Take 1 tablet (0.5 mg total) by mouth 2 (two) times daily as needed.  . fish oil-omega-3 fatty acids 1000 MG capsule Take 2 g by mouth daily.  . hydrochlorothiazide (HYDRODIURIL) 25 MG tablet Take  1 tablet (25 mg total) by mouth daily.  . meclizine (ANTIVERT) 25 MG tablet TAKE 1/2 TO 1 (ONE-HALF TO ONE) TABLET BY MOUTH THREE TIMES DAILY AS NEEDED FOR DIZZINESS  . Melatonin 5 MG TABS Take 5 mg by mouth at bedtime.  . metoprolol tartrate (LOPRESSOR) 25 MG tablet Take 0.5 tablets (12.5 mg total) by mouth 2 (two) times daily.  . Multiple Vitamins-Minerals (COMPLETE MULTIVITAMIN/MINERAL PO) Take 1 tablet by mouth daily.  Marland Kitchen NIFEdipine (PROCARDIA-XL/NIFEDICAL-XL) 30 MG 24 hr tablet Take 1 tablet (30 mg total) by mouth daily.  . Potassium 99 MG TABS Take 1 tablet (99 mg total) by mouth daily.  . rosuvastatin (CRESTOR) 5 MG  tablet Take 1 tablet (5 mg total) by mouth daily.  . traMADol (ULTRAM) 50 MG tablet Take 1 tablet (50 mg total) by mouth 2 (two) times daily.  . traZODone (DESYREL) 50 MG tablet Take 0.5-1 tablets (25-50 mg total) by mouth at bedtime as needed. for sleep     Past Surgical History:  Procedure Laterality Date  . CARDIAC CATHETERIZATION N/A 06/27/2015   Procedure: Left Heart Cath and Coronary Angiography;  Surgeon: Troy Sine, MD;  Location: [place] [facility];  Service: Cardiovascular;  Laterality: N/A;  . HERNIA REPAIR    . KIDNEY STONE SURGERY      Family History  Problem Relation Age of Onset  . Heart disease Father   . Heart attack Father   . Diabetes Sister   . Heart disease Brother   . Deep vein thrombosis Brother   . Congestive Heart Failure Brother   . Cancer Brother   . Leukemia Brother   . Heart attack Brother     New complaints: None.  Social history: Wife passed away 2 1/2 years ago which has been hard for him. Daughter lives across the road from him and helps him out when needed. Lives with girlfriend. Sees other daughter frequently. Good support system.  Controlled substance contract: n/a     Review of Systems  Constitutional: Negative.   HENT: Negative.   Eyes: Negative.   Respiratory: Negative.   Cardiovascular: Positive for leg swelling.  Gastrointestinal: Negative.   Endocrine: Negative.   Genitourinary: Positive for difficulty urinating.       Some dribbling and diffculty maintaining stream at times.  Musculoskeletal: Negative.   Skin: Negative.   Allergic/Immunologic: Negative.   Neurological: Positive for tremors and numbness.  Hematological: Negative.   Psychiatric/Behavioral: Positive for sleep disturbance.       Objective:   Physical Exam Vitals and nursing note reviewed.  Constitutional:      Appearance: Normal appearance. He is obese.  HENT:     Head: Normocephalic.     Right Ear: Tympanic membrane, ear canal and external ear  normal.     Left Ear: Tympanic membrane, ear canal and external ear normal.     Nose: Nose normal.     Mouth/Throat:     Mouth: Mucous membranes are dry.     Pharynx: Oropharynx is clear.  Eyes:     Pupils: Pupils are equal, round, and reactive to light.  Cardiovascular:     Rate and Rhythm: Normal rate and regular rhythm.     Pulses: Normal pulses.     Heart sounds: Murmur (1/6) present.  Pulmonary:     Effort: Pulmonary effort is normal.     Breath sounds: Normal breath sounds.  Abdominal:     General: Abdomen is flat. Bowel sounds are normal.  Palpations: Abdomen is soft.  Musculoskeletal:     Cervical back: Normal range of motion and neck supple.     Right lower leg: Edema (1+) present.     Left lower leg: Edema (1+) present.  Skin:    General: Skin is warm and dry.     Capillary Refill: Capillary refill takes less than 2 seconds.  Neurological:     General: No focal deficit present.     Mental Status: He is alert and oriented to person, place, and time.  Psychiatric:        Mood and Affect: Mood normal.        Behavior: Behavior normal.        Thought Content: Thought content normal.        Judgment: Judgment normal.    BP 114/70   Pulse 79   Temp 98.7 F (37.1 C) (Temporal)   Resp 20   Ht '5\' 8"'  (1.727 m)   Wt 257 lb (116.6 kg)   SpO2 92%   BMI 39.08 kg/m         Assessment & Plan:  JIYAAN STEINHAUSER comes in today with chief complaint of Medical Management of Chronic Issues   Diagnosis and orders addressed:  1. Essential hypertension, benign Low sodium diet.  Check BP at home.  - CMP14+EGFR - CBC with Differential/Platelet - hydrochlorothiazide (HYDRODIURIL) 25 MG tablet; Take 1 tablet (25 mg total) by mouth daily.  Dispense: 90 tablet; Refill: 1 - benazepril (LOTENSIN) 20 MG tablet; Take 1 tablet (20 mg total) by mouth daily.  Dispense: 90 tablet; Refill: 1 - metoprolol tartrate (LOPRESSOR) 25 MG tablet; Take 0.5 tablets (12.5 mg total) by mouth 2  (two) times daily.  Dispense: 90 tablet; Refill: 3  2. Raynaud's disease without gangrene Keep fingers and toes warm.  3. Thoracic ascending aortic aneurysm ([place]) Continue f/u with cardiology PRN. - NIFEdipine (PROCARDIA-XL/NIFEDICAL-XL) 30 MG 24 hr tablet; Take 1 tablet (30 mg total) by mouth daily.  Dispense: 90 tablet; Refill: 1  4. Polyneuropathy - traMADol (ULTRAM) 50 MG tablet; Take 1 tablet (50 mg total) by mouth 2 (two) times daily.  Dispense: 60 tablet; Refill: 2  5. Benign prostatic hyperplasia without lower urinary tract symptoms   6. Hyperlipidemia with target LDL less than 100 Low cholesterol/low fat diet encouraged. Exercise as tolerated. - Lipid panel - rosuvastatin (CRESTOR) 5 MG tablet; Take 1 tablet (5 mg total) by mouth daily.  Dispense: 30 tablet; Refill: 11  7. Primary insomnia Continue meds as prescribed. - traZODone (DESYREL) 50 MG tablet; Take 0.5-1 tablets (25-50 mg total) by mouth at bedtime as needed. for sleep  Dispense: 30 tablet; Refill: 5  8. Idiopathic chronic gout of multiple sites without tophus Low purine diet. - allopurinol (ZYLOPRIM) 300 MG tablet; Take 1 tablet (300 mg total) by mouth daily.  Dispense: 90 tablet; Refill: 1  9. Severe obesity (BMI >= 40) (HCC) Discussed diet and exercise for person with BMI >25 Will recheck weight in 3-6 months   10. Abnormal myocardial perfusion study F/u with cardiology PRN.   11. Hypokalemia Continue potassium supplement. Encouraged potassium-rich foods in diet.  - Potassium 99 MG TABS; Take 1 tablet (99 mg total) by mouth daily.  Dispense: 90 tablet; Refill: 1  12. GAD (generalized anxiety disorder) Continue medication as prescribed. Relaxation techniques encouraged.  - clonazePAM (KLONOPIN) 0.5 MG tablet; Take 1 tablet (0.5 mg total) by mouth 2 (two) times daily as needed.  Dispense: 60 tablet; Refill:  2   Labs pending Health Maintenance reviewed Diet and exercise encouraged  Follow up  plan: 3 months   Mary-Margaret Hassell Done, FNP

## 2020-01-07 LAB — CBC WITH DIFFERENTIAL/PLATELET
Basophils Absolute: 0.1 10*3/uL (ref 0.0–0.2)
Basos: 1 %
EOS (ABSOLUTE): 0.3 10*3/uL (ref 0.0–0.4)
Eos: 4 %
Hematocrit: 45.7 % (ref 37.5–51.0)
Hemoglobin: 14.9 g/dL (ref 13.0–17.7)
Immature Grans (Abs): 0.1 10*3/uL (ref 0.0–0.1)
Immature Granulocytes: 1 %
Lymphocytes Absolute: 1.5 10*3/uL (ref 0.7–3.1)
Lymphs: 18 %
MCH: 33.2 pg — ABNORMAL HIGH (ref 26.6–33.0)
MCHC: 32.6 g/dL (ref 31.5–35.7)
MCV: 102 fL — ABNORMAL HIGH (ref 79–97)
Monocytes Absolute: 0.9 10*3/uL (ref 0.1–0.9)
Monocytes: 11 %
Neutrophils Absolute: 5.4 10*3/uL (ref 1.4–7.0)
Neutrophils: 65 %
Platelets: 252 10*3/uL (ref 150–450)
RBC: 4.49 x10E6/uL (ref 4.14–5.80)
RDW: 14.5 % (ref 11.6–15.4)
WBC: 8.1 10*3/uL (ref 3.4–10.8)

## 2020-01-07 LAB — CMP14+EGFR
ALT: 20 IU/L (ref 0–44)
AST: 25 IU/L (ref 0–40)
Albumin/Globulin Ratio: 1.5 (ref 1.2–2.2)
Albumin: 4 g/dL (ref 3.7–4.7)
Alkaline Phosphatase: 64 IU/L (ref 39–117)
BUN/Creatinine Ratio: 14 (ref 10–24)
BUN: 14 mg/dL (ref 8–27)
Bilirubin Total: 0.3 mg/dL (ref 0.0–1.2)
CO2: 20 mmol/L (ref 20–29)
Calcium: 9.6 mg/dL (ref 8.6–10.2)
Chloride: 102 mmol/L (ref 96–106)
Creatinine, Ser: 1.02 mg/dL (ref 0.76–1.27)
GFR calc Af Amer: 82 mL/min/{1.73_m2} (ref >59)
GFR calc non Af Amer: 71 mL/min/{1.73_m2} (ref >59)
Globulin, Total: 2.6 g/dL (ref 1.5–4.5)
Glucose: 101 mg/dL — ABNORMAL HIGH (ref 65–99)
Potassium: 4.3 mmol/L (ref 3.5–5.2)
Sodium: 139 mmol/L (ref 134–144)
Total Protein: 6.6 g/dL (ref 6.0–8.5)

## 2020-01-07 LAB — LIPID PANEL
Chol/HDL Ratio: 3.5 ratio (ref 0.0–5.0)
Cholesterol, Total: 141 mg/dL (ref 100–199)
HDL: 40 mg/dL (ref >39)
LDL Chol Calc (NIH): 79 mg/dL (ref 0–99)
Triglycerides: 124 mg/dL (ref 0–149)
VLDL Cholesterol Cal: 22 mg/dL (ref 5–40)

## 2020-01-17 DIAGNOSIS — Z23 Encounter for immunization: Secondary | ICD-10-CM | POA: Diagnosis not present

## 2020-02-06 ENCOUNTER — Other Ambulatory Visit: Payer: Self-pay | Admitting: Nurse Practitioner

## 2020-02-13 ENCOUNTER — Telehealth: Payer: Self-pay | Admitting: Nurse Practitioner

## 2020-02-13 NOTE — Chronic Care Management (AMB) (Signed)
  Chronic Care Management   Outreach Note  02/13/2020 Name: Joseph [name] MRN: [id number] DOB: December 11, 1940  Joseph [name] is a 79 y.o. year old male who is a primary care patient of Chevis Pretty, FNP. I reached out to Oren Bracket by phone today in response to a referral sent by [name] health plan.     An unsuccessful telephone outreach was attempted today. The patient was referred to the case management team for assistance with care management and care coordination.   Follow Up Plan: A HIPPA compliant phone message was left for the patient providing contact information and requesting a return call. The care management team will reach out to the patient again over the next 7 days.  If patient returns call to provider office, please advise to call [place] at [phone].  [place], [place] [address] Direct Dial: [phone] [email or web address]: [email or web address]

## 2020-02-14 NOTE — Chronic Care Management (AMB) (Signed)
  Chronic Care Management   Note  02/14/2020 Name: Joseph [name] MRN: [id number] DOB: [birth year]  Joseph [name] is a 79 y.o. year old male who is a primary care patient of Chevis Pretty, FNP. I reached out to Joseph [name] by phone today in response to a referral sent by Joseph [name] health plan.     Joseph [name] was given information about Chronic Care Management services today including:  1. CCM service includes personalized support from designated clinical staff supervised by his physician, including individualized plan of care and coordination with other care providers 2. 24/7 contact phone numbers for assistance for urgent and routine care needs. 3. Service will only be billed when office clinical staff spend 20 minutes or more in a month to coordinate care. 4. Only one practitioner may furnish and bill the service in a calendar month. 5. The patient may stop CCM services at any time (effective at the end of the month) by phone call to the office staff. 6. The patient will be responsible for cost sharing (co-pay) of up to 20% of the service fee (after annual deductible is met).  Patient agreed to services and verbal consent obtained.   Follow up plan: Telephone appointment with care management team member scheduled for:06/15/2020.  [place], [place] [address] Direct Dial: [phone] [name] Website: [email or web address]

## 2020-04-09 ENCOUNTER — Encounter: Payer: Self-pay | Admitting: Nurse Practitioner

## 2020-04-09 ENCOUNTER — Other Ambulatory Visit: Payer: Self-pay | Admitting: Nurse Practitioner

## 2020-04-09 ENCOUNTER — Other Ambulatory Visit: Payer: Self-pay | Admitting: Cardiothoracic Surgery

## 2020-04-09 ENCOUNTER — Ambulatory Visit (INDEPENDENT_AMBULATORY_CARE_PROVIDER_SITE_OTHER): Payer: Medicare HMO | Admitting: Nurse Practitioner

## 2020-04-09 ENCOUNTER — Other Ambulatory Visit: Payer: Self-pay

## 2020-04-09 VITALS — BP 128/74 | HR 66 | Temp 97.9°F | Resp 20 | Ht 68.0 in | Wt 257.0 lb

## 2020-04-09 DIAGNOSIS — M1A09X0 Idiopathic chronic gout, multiple sites, without tophus (tophi): Secondary | ICD-10-CM | POA: Diagnosis not present

## 2020-04-09 DIAGNOSIS — N400 Enlarged prostate without lower urinary tract symptoms: Secondary | ICD-10-CM | POA: Diagnosis not present

## 2020-04-09 DIAGNOSIS — F5101 Primary insomnia: Secondary | ICD-10-CM | POA: Diagnosis not present

## 2020-04-09 DIAGNOSIS — E785 Hyperlipidemia, unspecified: Secondary | ICD-10-CM | POA: Diagnosis not present

## 2020-04-09 DIAGNOSIS — I10 Essential (primary) hypertension: Secondary | ICD-10-CM

## 2020-04-09 DIAGNOSIS — I712 Thoracic aortic aneurysm, without rupture, unspecified: Secondary | ICD-10-CM

## 2020-04-09 DIAGNOSIS — E876 Hypokalemia: Secondary | ICD-10-CM | POA: Diagnosis not present

## 2020-04-09 DIAGNOSIS — F411 Generalized anxiety disorder: Secondary | ICD-10-CM

## 2020-04-09 DIAGNOSIS — I7300 Raynaud's syndrome without gangrene: Secondary | ICD-10-CM | POA: Diagnosis not present

## 2020-04-09 DIAGNOSIS — G629 Polyneuropathy, unspecified: Secondary | ICD-10-CM

## 2020-04-09 DIAGNOSIS — I7121 Aneurysm of the ascending aorta, without rupture: Secondary | ICD-10-CM

## 2020-04-09 MED ORDER — NIFEDIPINE ER OSMOTIC RELEASE 30 MG PO TB24: 30.0000 mg | ORAL_TABLET | Freq: Every day | ORAL | 1 refills | Status: DC

## 2020-04-09 MED ORDER — TRAMADOL HCL 50 MG PO TABS: 50.0000 mg | ORAL_TABLET | Freq: Two times a day (BID) | ORAL | 2 refills | Status: DC

## 2020-04-09 MED ORDER — HYDROCHLOROTHIAZIDE 25 MG PO TABS: 25.0000 mg | ORAL_TABLET | Freq: Every day | ORAL | 1 refills | Status: DC

## 2020-04-09 MED ORDER — ROSUVASTATIN CALCIUM 5 MG PO TABS: 5.0000 mg | ORAL_TABLET | Freq: Every day | ORAL | 1 refills | Status: DC

## 2020-04-09 MED ORDER — CLONAZEPAM 0.5 MG PO TABS: 0.5000 mg | ORAL_TABLET | Freq: Two times a day (BID) | ORAL | 2 refills | Status: DC | PRN

## 2020-04-09 MED ORDER — TRAZODONE HCL 50 MG PO TABS: 25.0000 mg | ORAL_TABLET | Freq: Every evening | ORAL | 1 refills | Status: DC | PRN

## 2020-04-09 MED ORDER — BENAZEPRIL HCL 20 MG PO TABS: 20.0000 mg | ORAL_TABLET | Freq: Every day | ORAL | 1 refills | Status: DC

## 2020-04-09 MED ORDER — METOPROLOL TARTRATE 25 MG PO TABS: 12.5000 mg | ORAL_TABLET | Freq: Two times a day (BID) | ORAL | 1 refills | Status: DC

## 2020-04-09 NOTE — Progress Notes (Signed)
Subjective:    Patient ID: Joseph [name], male    DOB: September 11, 1941, 79 y.o.   MRN: [id number]   Chief Complaint: Medical Management of Chronic Issues    HPI:  1. Essential hypertension, benign Patient states he checks his blood pressure occasionally at home. His BP runs generally in the 120s-130s. Denies dizziness, or chest pain. Admits to an occasional headache that goes away with Tylenol or Advil. BP Readings from Last 3 Encounters:  04/09/20 128/74  01/06/20 114/70  07/05/19 126/70    2. Raynaud's disease without gangrene Patient states his fingers get numb and red frequently but that he has gotten used to it.  3. Benign prostatic hyperplasia without lower urinary tract symptoms Denies further escalation of his BPH symptoms.  4. Hyperlipidemia with target LDL less than 100 Patient states that he tries to eat a heart healthy diet everyday. He is active around his home and out with his girlfriend. Lab Results  Component Value Date   CHOL 141 01/06/2020   HDL 40 01/06/2020   LDLCALC 79 01/06/2020   TRIG 124 01/06/2020   CHOLHDL 3.5 01/06/2020    5. Primary insomnia Patient states he has to take melatonin almost every night to help him get to sleep. He mostly has leg pain that keeps him up.   6. Idiopathic chronic gout of multiple sites without tophus Patient states he has been taking his allopurinol on a regular basis and has not had any gout flares. Lab Results  Component Value Date   LABURIC 10.1 (H) 03/09/2014     7. Severe obesity (BMI >= 40) (HCC) Wt Readings from Last 3 Encounters:  04/09/20 257 lb (116.6 kg)  01/06/20 257 lb (116.6 kg)  11/28/19 254 lb (115.2 kg)   BMI Readings from Last 3 Encounters:  04/09/20 39.08 kg/m  01/06/20 39.08 kg/m  11/28/19 38.62 kg/m   Patient states he tries to stay as active as tolerated and watches what he eats.  8. Hypokalemia Patient admits to occasional muscle cramps but they aren't too bothersome.  Lab Results   Component Value Date   K 4.3 01/06/2020    9. Thoracic ascending aortic aneurysm [facility]) Patient states he has seen his cardiologist who told him that his aneurysm wasn't big enough to operate on. He denies chest or back pain related to this.  10. Polyneuropathy    Outpatient Encounter Medications as of 04/09/2020  Medication Sig  . allopurinol (ZYLOPRIM) 300 MG tablet Take 1 tablet (300 mg total) by mouth daily.  . benazepril (LOTENSIN) 20 MG tablet Take 1 tablet (20 mg total) by mouth daily.  . clonazePAM (KLONOPIN) 0.5 MG tablet Take 1 tablet (0.5 mg total) by mouth 2 (two) times daily as needed.  . fish oil-omega-3 fatty acids 1000 MG capsule Take 2 g by mouth daily.  . hydrochlorothiazide (HYDRODIURIL) 25 MG tablet Take 1 tablet (25 mg total) by mouth daily.  . meclizine (ANTIVERT) 25 MG tablet TAKE 1/2 TO 1 (ONE-HALF TO ONE) TABLET BY MOUTH THREE TIMES DAILY AS NEEDED FOR DIZZINESS  . Melatonin 5 MG TABS Take 5 mg by mouth at bedtime.  . metoprolol tartrate (LOPRESSOR) 25 MG tablet Take 0.5 tablets (12.5 mg total) by mouth 2 (two) times daily.  . Multiple Vitamins-Minerals (COMPLETE MULTIVITAMIN/MINERAL PO) Take 1 tablet by mouth daily.  Marland Kitchen NIFEdipine (PROCARDIA-XL/NIFEDICAL-XL) 30 MG 24 hr tablet Take 1 tablet (30 mg total) by mouth daily.  . Potassium 99 MG TABS Take 1 tablet (99  mg total) by mouth daily.  . traMADol (ULTRAM) 50 MG tablet Take 1 tablet (50 mg total) by mouth 2 (two) times daily.  . traZODone (DESYREL) 50 MG tablet Take 0.5-1 tablets (25-50 mg total) by mouth at bedtime as needed. for sleep  . rosuvastatin (CRESTOR) 5 MG tablet Take 1 tablet (5 mg total) by mouth daily.   No facility-administered encounter medications on file as of 04/09/2020.     New complaints: Patient denies new complaints today.  Social history: Patient denies new social complaints.  Controlled substance contract:    Review of Systems  Cardiovascular: Positive for leg swelling.  All  other systems reviewed and are negative.      Objective:   Physical Exam Constitutional:      Appearance: Normal appearance. He is obese.  HENT:     Head: Normocephalic.  Eyes:     Conjunctiva/sclera: Conjunctivae normal.  Cardiovascular:     Rate and Rhythm: Normal rate and regular rhythm.     Pulses: Normal pulses.     Heart sounds: Normal heart sounds.  Pulmonary:     Breath sounds: Normal breath sounds.  Abdominal:     General: Bowel sounds are normal.  Musculoskeletal:     Cervical back: Neck supple.     Right lower leg: 1+ Pitting Edema present.     Left lower leg: 1+ Pitting Edema present.  Skin:    General: Skin is warm and dry.  Neurological:     Mental Status: He is alert and oriented to person, place, and time.  Psychiatric:        Mood and Affect: Mood normal.        Behavior: Behavior normal.        Assessment & Plan:  SCHYLAR WUEBKER comes in today with chief complaint of Medical Management of Chronic Issues   Diagnosis and orders addressed:  1. Essential hypertension, benign Low sodium diet - hydrochlorothiazide (HYDRODIURIL) 25 MG tablet; Take 1 tablet (25 mg total) by mouth daily.  Dispense: 90 tablet; Refill: 1 - benazepril (LOTENSIN) 20 MG tablet; Take 1 tablet (20 mg total) by mouth daily.  Dispense: 90 tablet; Refill: 1 - metoprolol tartrate (LOPRESSOR) 25 MG tablet; Take 0.5 tablets (12.5 mg total) by mouth 2 (two) times daily.  Dispense: 90 tablet; Refill: 1 - CMP14+EGFR - CBC with Differential/Platelet  2. Raynaud's disease without gangrene Keep hands warm  3. Benign prostatic hyperplasia without lower urinary tract symptoms Keep follow up with neurology  4. Hyperlipidemia with target LDL less than 100 Low fat diet - rosuvastatin (CRESTOR) 5 MG tablet; Take 1 tablet (5 mg total) by mouth daily.  Dispense: 90 tablet; Refill: 1 - Lipid panel  5. Primary insomnia Bedtime routine - traZODone (DESYREL) 50 MG tablet; Take 0.5-1 tablets  (25-50 mg total) by mouth at bedtime as needed. for sleep  Dispense: 90 tablet; Refill: 1  6. Idiopathic chronic gout of multiple sites without tophus Low purine diet discussed  7. Severe obesity (BMI >= 40) (HCC) Discussed diet and exercise for person with BMI >25 Will recheck weight in 3-6 months  8. Hypokalemia   9. Thoracic ascending aortic aneurysm (HCC) - NIFEdipine (PROCARDIA-XL/NIFEDICAL-XL) 30 MG 24 hr tablet; Take 1 tablet (30 mg total) by mouth daily.  Dispense: 90 tablet; Refill: 1  10. Polyneuropathy - traMADol (ULTRAM) 50 MG tablet; Take 1 tablet (50 mg total) by mouth 2 (two) times daily.  Dispense: 60 tablet; Refill: 2  11. GAD (generalized  anxiety disorder) - clonazePAM (KLONOPIN) 0.5 MG tablet; Take 1 tablet (0.5 mg total) by mouth 2 (two) times daily as needed.  Dispense: 60 tablet; Refill: 2   Labs pending Health Maintenance reviewed Diet and exercise encouraged  Follow up plan: Follow up in 3 months or sooner if new complaints arise.  Mary-Margaret Hassell Done, FNP Robynn Pane, RN, FNP student

## 2020-04-11 LAB — CBC WITH DIFFERENTIAL/PLATELET
Basophils Absolute: 0.1 10*3/uL (ref 0.0–0.2)
Basos: 1 %
EOS (ABSOLUTE): 0.3 10*3/uL (ref 0.0–0.4)
Eos: 4 %
Hematocrit: 43.3 % (ref 37.5–51.0)
Hemoglobin: 14 g/dL (ref 13.0–17.7)
Immature Grans (Abs): 0 10*3/uL (ref 0.0–0.1)
Immature Granulocytes: 1 %
Lymphocytes Absolute: 1.7 10*3/uL (ref 0.7–3.1)
Lymphs: 22 %
MCH: 32.9 pg (ref 26.6–33.0)
MCHC: 32.3 g/dL (ref 31.5–35.7)
MCV: 102 fL — ABNORMAL HIGH (ref 79–97)
Monocytes Absolute: 0.8 10*3/uL (ref 0.1–0.9)
Monocytes: 11 %
Neutrophils Absolute: 4.7 10*3/uL (ref 1.4–7.0)
Neutrophils: 61 %
Platelets: 205 10*3/uL (ref 150–450)
RBC: 4.25 x10E6/uL (ref 4.14–5.80)
RDW: 13.2 % (ref 11.6–15.4)
WBC: 7.6 10*3/uL (ref 3.4–10.8)

## 2020-04-11 LAB — CMP14+EGFR
ALT: 24 IU/L (ref 0–44)
AST: 27 IU/L (ref 0–40)
Albumin/Globulin Ratio: 1.7 (ref 1.2–2.2)
Albumin: 4 g/dL (ref 3.7–4.7)
Alkaline Phosphatase: 54 IU/L (ref 39–117)
BUN/Creatinine Ratio: 16 (ref 10–24)
BUN: 16 mg/dL (ref 8–27)
Bilirubin Total: 0.3 mg/dL (ref 0.0–1.2)
CO2: 23 mmol/L (ref 20–29)
Calcium: 9.2 mg/dL (ref 8.6–10.2)
Chloride: 104 mmol/L (ref 96–106)
Creatinine, Ser: 0.97 mg/dL (ref 0.76–1.27)
GFR calc Af Amer: 87 mL/min/{1.73_m2} (ref >59)
GFR calc non Af Amer: 75 mL/min/{1.73_m2} (ref >59)
Globulin, Total: 2.3 g/dL (ref 1.5–4.5)
Glucose: 101 mg/dL — ABNORMAL HIGH (ref 65–99)
Potassium: 4.1 mmol/L (ref 3.5–5.2)
Sodium: 140 mmol/L (ref 134–144)
Total Protein: 6.3 g/dL (ref 6.0–8.5)

## 2020-04-11 LAB — LIPID PANEL
Chol/HDL Ratio: 3.9 ratio (ref 0.0–5.0)
Cholesterol, Total: 140 mg/dL (ref 100–199)
HDL: 36 mg/dL — ABNORMAL LOW (ref >39)
LDL Chol Calc (NIH): 72 mg/dL (ref 0–99)
Triglycerides: 187 mg/dL — ABNORMAL HIGH (ref 0–149)
VLDL Cholesterol Cal: 32 mg/dL (ref 5–40)

## 2020-05-24 ENCOUNTER — Ambulatory Visit: Payer: Medicare HMO | Admitting: Cardiothoracic Surgery

## 2020-05-24 ENCOUNTER — Ambulatory Visit
Admission: RE | Admit: 2020-05-24 | Discharge: 2020-05-24 | Disposition: A | Discharge status: Home / Self Care | Payer: Medicare HMO | Source: Ambulatory Visit | Attending: Cardiothoracic Surgery | Admitting: Cardiothoracic Surgery

## 2020-05-24 ENCOUNTER — Other Ambulatory Visit: Payer: Self-pay

## 2020-05-24 VITALS — BP 128/78 | HR 65 | Resp 20 | Ht 68.0 in | Wt 262.0 lb

## 2020-05-24 DIAGNOSIS — I712 Thoracic aortic aneurysm, without rupture, unspecified: Secondary | ICD-10-CM

## 2020-05-24 DIAGNOSIS — I288 Other diseases of pulmonary vessels: Secondary | ICD-10-CM | POA: Diagnosis not present

## 2020-05-24 MED ORDER — IOPAMIDOL (ISOVUE-370) INJECTION 76%
75.0000 mL | Freq: Once | INTRAVENOUS | Status: AC | PRN
  Administered 2020-05-24: 75 mL via INTRAVENOUS

## 2020-05-24 NOTE — Patient Instructions (Signed)

## 2020-05-24 NOTE — Progress Notes (Signed)
[address]       [place] [address]             [phone]                    Joseph [name] [facility] Medical Record #[id number] Date of Birth: [birth year]  Referring: Joseph [name]    Chief Complaint:    Chief Complaint  Patient presents with  . Thoracic Aortic Aneurysm    1 year f/u with CTA Chest    History of Present Illness:    Joseph [name] 79 y.o. male is seen in the office in follow-up for a known dilatation of the ascending aorta.  Patient does note over the past year some increasing dyspnea with exertion he does do work around his house but notes this is becoming increasingly difficult to do.  He also notes increasing pedal edema. Last echocardiogram was 2016-done through [facility]    The patient notes that on June 04 2015 holiday he developed epigastric and substernal chest pain radiating to the left neck into both arms, with numbness in his hands. At the time he was camping at [facility] and went to the [facility] emergency room and was admitted. He was hospitalized for several days.  The patient did have a CT scan of the chest which revealed a 4.8 cm dilatation of the ascending aorta. On discharge the patient was told to see a thoracic surgeon. He has had serial CT od chest here since that time and returns today with repeat scan .  Current Activity/ Functional Status:  Patient is independent with mobility/ambulation, transfers, ADL's, IADL's.   Zubrod Score: At the time of surgery this patient's most appropriate activity status/level should be described as: []     0    Normal activity, no symptoms [x]     1    Restricted in physical strenuous activity but ambulatory, able to do out light work []     2    Ambulatory and capable of self care, unable to do work activities, up and about               >50 % of waking hours                              []     3    Only limited self care, in bed greater than 50% of waking hours []      4    Completely disabled, no self care, confined to bed or chair []     5    Moribund   Past Medical History:  Diagnosis Date  . Anxiety   . Ascending aortic aneurysm ([place])   . Essential hypertension   . GERD (gastroesophageal reflux disease)   . Gout   . History of kidney stones   . Insomnia     Past Surgical History:  Procedure Laterality Date  . CARDIAC CATHETERIZATION N/A 06/27/2015   Procedure: Left Heart Cath and Coronary Angiography;  Surgeon: Troy Sine, MD;  Location: [facility];  Service: Cardiovascular;  Laterality: N/A;  . HERNIA REPAIR    . KIDNEY STONE SURGERY      Family History  Problem Relation Age of Onset  . Heart disease Father   . Heart attack Father   . Diabetes Sister   . Heart disease Brother   .  Deep vein thrombosis Brother   . Congestive Heart Failure Brother   . Cancer Brother   . Leukemia Brother   . Heart attack Brother    patient has history of sudden death of his father at age 57 and his brother at age 12-  these were attributed to myocardial infarctions but there is no documented autopsy . The patient notes both of them died at home suddenly Patient's brother has died of heart failure and colon cancer last week   Social History   Socioeconomic History  . Marital status: Widowed    Spouse name: Not on file  . Number of children: 2  . Years of education: 10  . Highest education level: 10th grade  Occupational History  . 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 Smoking status: Former Smoker    Packs/day: 0.50    Years: 1.00    Pack years: 0.50    Types: Cigarettes    Quit date: 03/22/1958    Years since quitting: 62.2  . Smokeless tobacco: Former Systems developer    Types: Chew  . Tobacco comment: QUIT CHEWING IN 1989  Vaping Use  . Vaping Use: Never used  Substance and Sexual Activity  . Alcohol use: No    Alcohol/week: 0.0 standard drinks  . Drug use: No  . Sexual activity:  Not on file  Other Topics Concern  . Not on file  Social History Narrative      Right handed    Caffeine use: none    Social History   Tobacco Use  Smoking Status Former Smoker  . Packs/day: 0.50  . Years: 1.00  . Pack years: 0.50  . Types: Cigarettes  . Quit date: 03/22/1958  . Years since quitting: 62.2  Smokeless Tobacco Former Systems developer  . Types: Chew  Tobacco Comment   QUIT CHEWING IN 1989    Social History   Substance and Sexual Activity  Alcohol Use No  . Alcohol/week: 0.0 standard drinks     Allergies  Allergen Reactions  . Carafate [Sucralfate] Other (See Comments)    TOOK SKIN FROM HIS MOUTH  . Ciprofloxacin Hcl Other (See Comments)    CAN'T REMEMBER  . Prevacid [Lansoprazole] Other (See Comments)    MADE HIM FEEL BAD  . Quinolones     Patient was warned about not using Cipro and similar antibiotics. Recent studies have raised concern that fluoroquinolone antibiotics could be associated with an increased risk of aortic aneurysm Fluoroquinolones have non-antimicrobial properties that might jeopardise the integrity of the extracellular matrix of the vascular wall In a  propensity score matched cohort study in [place], there was a 66% increased rate of aortic aneurysm or dissection associated with oral fluoroquinolone use, compared wit  . Sulfa Antibiotics Diarrhea    SEVERE    Current Outpatient Medications  Medication Sig Dispense Refill  . allopurinol (ZYLOPRIM) 300 MG tablet Take 1 tablet (300 mg total) by mouth daily. 90 tablet 1  . benazepril (LOTENSIN) 20 MG tablet Take 1 tablet (20 mg total) by mouth daily. 90 tablet 1  . clonazePAM (KLONOPIN) 0.5 MG tablet Take 1 tablet (0.5 mg total) by mouth 2 (two) times daily as needed. 60 tablet 2  . fish oil-omega-3 fatty acids 1000 MG capsule Take 2 g by mouth daily.    . hydrochlorothiazide (HYDRODIURIL) 25 MG tablet Take 1 tablet (25 mg total) by mouth daily. 90 tablet 1  . meclizine (ANTIVERT)  25 MG tablet  TAKE 1/2 TO 1 (ONE-HALF TO ONE) TABLET BY MOUTH THREE TIMES DAILY AS NEEDED FOR DIZZINESS 30 tablet 0  . Melatonin 5 MG TABS Take 5 mg by mouth at bedtime.    . metoprolol tartrate (LOPRESSOR) 25 MG tablet Take 0.5 tablets (12.5 mg total) by mouth 2 (two) times daily. 90 tablet 1  . Multiple Vitamins-Minerals (COMPLETE MULTIVITAMIN/MINERAL PO) Take 1 tablet by mouth daily.    Marland Kitchen NIFEdipine (PROCARDIA-XL/NIFEDICAL-XL) 30 MG 24 hr tablet Take 1 tablet (30 mg total) by mouth daily. 90 tablet 1  . Potassium 99 MG TABS Take 1 tablet (99 mg total) by mouth daily. 90 tablet 1  . rosuvastatin (CRESTOR) 5 MG tablet Take 1 tablet (5 mg total) by mouth daily. 90 tablet 1  . traMADol (ULTRAM) 50 MG tablet Take 1 tablet (50 mg total) by mouth 2 (two) times daily. 60 tablet 2  . traZODone (DESYREL) 50 MG tablet Take 0.5-1 tablets (25-50 mg total) by mouth at bedtime as needed. for sleep 90 tablet 1   No current facility-administered medications for this visit.      Review of Systems:  Review of Systems  Constitutional: Positive for malaise/fatigue.  HENT: Negative.   Eyes: Negative.   Respiratory: Positive for shortness of breath. Negative for cough, hemoptysis and sputum production.   Cardiovascular: Positive for leg swelling. Negative for chest pain, palpitations, orthopnea, claudication and PND.  Gastrointestinal: Negative.  Negative for heartburn and nausea.  Genitourinary: Negative.   Musculoskeletal: Negative.   Skin: Negative.   Neurological: Negative.   Endo/Heme/Allergies: Negative.   Psychiatric/Behavioral: Negative.       Physical Exam: BP 128/78   Pulse 65   Resp 20   Ht 5\' 8"  (1.727 m)   Wt 262 lb (118.8 kg)   SpO2 94% Comment: RA  BMI 39.84 kg/m   PHYSICAL EXAMINATION: General appearance: alert, cooperative, appears older than stated age and no distress Lymph nodes: Cervical, supraclavicular, and axillary nodes normal. Resp: clear to auscultation bilaterally Cardiac:  Patient is does not have a murmur of aortic insufficiency or stenosis evident Back: symmetric, no curvature. ROM normal. No CVA tenderness. GI: soft, non-tender; bowel sounds normal; no masses,  no organomegaly Extremities: extremities normal, atraumatic, no cyanosis or edema and Homans sign is negative, no sign of DVT Neurologic: Grossly normal  Diagnostic Studies & Laboratory data:     Recent Radiology Findings: Ct Angio Chest Aorta W &/or Wo Contrast CT ANGIO CHEST AORTA W/CM & OR WO/CM  Result Date: 05/24/2020 CLINICAL DATA:  Thoracic aortic aneurysm, follow-up. History of dyspnea, shortness of breath with exertion. EXAM: CT ANGIOGRAPHY CHEST WITH CONTRAST TECHNIQUE: Multidetector CT imaging of the chest was performed using the standard protocol during bolus administration of intravenous contrast. Multiplanar CT image reconstructions and MIPs were obtained to evaluate the vascular anatomy. CONTRAST:  67mL ISOVUE-370 IOPAMIDOL (ISOVUE-370) INJECTION 76% COMPARISON:  05/19/2019 and previous FINDINGS: Cardiovascular: Heart size normal. No pericardial effusion. Dilated right and left pulmonary arteries. Satisfactory opacification of pulmonary arteries noted, and there is no evidence of pulmonary emboli. Scattered coronary calcifications in the LAD. In contrast opacification of the thoracic aorta. Aortic Root: --Valve: 2.6 cm --Sinuses: 4.3 cm --Sinotubular Junction: 3.3 cm Limitations by motion: Moderate Thoracic Aorta: --Ascending Aorta: 4.8 cm --Aortic Arch: 3.4 cm --Descending Aorta: 3.2 Moderate calcified atheromatous plaque in the distal arch and descending thoracic segment. Focal eccentric nonocclusive mural thrombus in the distal descending thoracic segment, stable. Visualized proximal abdominal aorta  atheromatous, normal in caliber. Mediastinum/Nodes: No hilar or mediastinal adenopathy. Lungs/Pleura: No pleural effusion. No pneumothorax. Linear scarring or subsegmental atelectasis in the lung  bases as before. Lungs otherwise clear. Upper Abdomen: 3.5 cm segment 4A hepatic cyst, stable since 12/20/2015. No acute findings. Musculoskeletal: Anterior vertebral endplate spurring at multiple levels in the lower thoracic spine. Review of the MIP images confirms the above findings. IMPRESSION: 1. Stable 4.8 cm ascending aortic aneurysm without complicating features. Recommend semi-annual imaging followup by CTA or MRA and referral to cardiothoracic surgery if not already obtained. This recommendation follows 2010 ACCF/AHA/AATS/ACR/ASA/SCA/SCAI/SIR/STS/SVM Guidelines for the Diagnosis and Management of Patients With Thoracic Aortic Disease. Circulation. 2010; 121: I097-D532. Aortic aneurysm NOS (ICD10-I71.9) 2. Coronary calcifications. The severity of coronary artery disease and any potential stenosis cannot be assessed on this non-gated CT examination. 3. Dilated right and left pulmonary arteries suggesting pulmonary arterial hypertension. 4. 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 Electronically Signed   By: Lucrezia Europe M.D.   On: 05/24/2020 16:38      Result Date: 05/19/2019 CLINICAL DATA:  Follow-up thoracic aortic aneurysm. EXAM: CT ANGIOGRAPHY CHEST WITH CONTRAST TECHNIQUE: Multidetector CT imaging of the chest was performed using the standard protocol during bolus administration of intravenous contrast. Multiplanar CT image reconstructions and MIPs were obtained to evaluate the vascular anatomy. CONTRAST:  65mL ISOVUE-370 IOPAMIDOL (ISOVUE-370) INJECTION 76% COMPARISON:  Apr 15, 2018 FINDINGS: Cardiovascular: Atherosclerotic changes are seen in the thoracic and upper abdominal aorta. No dissection. The ascending thoracic aortic aneurysm measures 4.8 cm on series 6, image 67, not significantly changed. The remainder of the thoracic aorta and upper abdominal aorta are nonaneurysmal. The thoracic aortic arch  demonstrates a normal configuration of branching vessels. The central pulmonary arteries are unremarkable. Coronary artery calcifications are identified. The heart is unchanged. Mediastinum/Nodes: No enlarged mediastinal, hilar, or axillary lymph nodes. Thyroid gland, trachea, and esophagus demonstrate no significant findings. Lungs/Pleura: Central airways are normal. No pneumothorax. There is some atelectasis in the lingula which is chronic. There is scarring or atelectasis in the anterior right lower lobe which is stable. No suspicious nodules, masses, or infiltrates. Upper Abdomen: There is a cyst in the liver which is stable. There is a mass posterior off the right kidney measuring 2.2 cm on series 6, image 166 unchanged since July of 2016. The attenuation is 24 Hounsfield units today, likely a mildly complicated cyst. The mass is definitely cystic on previous studies such as September 2017. No other abnormalities. Musculoskeletal: No chest wall abnormality. No acute or significant osseous findings. Review of the MIP images confirms the above findings. IMPRESSION: 1. The ascending thoracic aortic aneurysm measures 4.8 cm in greatest dimension, unchanged. Ascending thoracic aortic aneurysm. Recommend semi-annual imaging followup by CTA or MRA and referral to cardiothoracic surgery if not already obtained. This recommendation follows 2010 ACCF/AHA/AATS/ACR/ASA/SCA/SCAI/SIR/STS/SVM Guidelines for the Diagnosis and Management of Patients With Thoracic Aortic Disease. Circulation. 2010; 121: D924-Q683. Aortic aneurysm NOS (ICD10-I71.9) 2. Atherosclerotic changes in the thoracic aorta. Coronary artery calcifications. 3. Hepatic cysts. 4. Mildly complicated renal cyst comparing to previous studies. Aortic Atherosclerosis (ICD10-I70.0). Electronically Signed   By: Dorise Bullion III M.D   On: 05/19/2019 12:26   I have independently reviewed the above radiology studies  and reviewed the findings with the  patient.    Ct Angio Chest Aorta W/cm &/or Wo/cm  Result Date: 04/15/2018 CLINICAL DATA:  Thoracic aortic aneurysm without rupture EXAM:  CT ANGIOGRAPHY CHEST WITH CONTRAST TECHNIQUE: Multidetector CT imaging of the chest was performed using the standard protocol during bolus administration of intravenous contrast. Multiplanar CT image reconstructions and MIPs were obtained to evaluate the vascular anatomy. CONTRAST:  59mL ISOVUE-370 IOPAMIDOL (ISOVUE-370) INJECTION 76% COMPARISON:  CT scan of July 16, 2017. FINDINGS: Cardiovascular: Atherosclerosis of thoracic aorta is noted without dissection. Great vessels are widely patent. Grossly stable 4.8 cm ascending thoracic aortic aneurysm is noted. Transverse aortic arch measures 3.2 cm. Proximal descending thoracic aorta measures 3.1 cm. No pericardial effusion is noted. Mediastinum/Nodes: No enlarged mediastinal, hilar, or axillary lymph nodes. Thyroid gland, trachea, and esophagus demonstrate no significant findings. Lungs/Pleura: No pneumothorax or pleural effusion is noted. Minimal subsegmental atelectasis or scarring is noted in the right lower lobe. Upper Abdomen: No acute abnormality. Musculoskeletal: No chest wall abnormality. No acute or significant osseous findings. Review of the MIP images confirms the above findings. IMPRESSION: Grossly stable 4.8 cm ascending thoracic aortic aneurysm. Ascending thoracic aortic aneurysm. Recommend semi-annual imaging followup by CTA or MRA and referral to cardiothoracic surgery if not already obtained. This recommendation follows 2010 ACCF/AHA/AATS/ACR/ASA/SCA/SCAI/SIR/STS/SVM Guidelines for the Diagnosis and Management of Patients With Thoracic Aortic Disease. Circulation. 2010; 121: U981-X914. Aortic Atherosclerosis (ICD10-I70.0). Electronically Signed   By: Marijo Conception, M.D.   On: 04/15/2018 15:14      Ct Angio Chest Aorta W/cm &/or Wo/cm  Result Date: 07/16/2017 CLINICAL DATA:  Thoracic aortic aneurysm  without rupture. EXAM: CT ANGIOGRAPHY CHEST WITH CONTRAST TECHNIQUE: Multidetector CT imaging of the chest was performed using the standard protocol during bolus administration of intravenous contrast. Multiplanar CT image reconstructions and MIPs were obtained to evaluate the vascular anatomy. CONTRAST:  75 mL of Isovue 370 intravenously. COMPARISON:  CT scan of August 28, 2016. FINDINGS: Cardiovascular: Stable 4.9 cm ascending thoracic aortic aneurysm is noted without rupture. Atherosclerosis of thoracic aorta is noted. Great vessels are widely patent without significant stenosis. No pericardial effusion is noted. Transverse aortic arch measures 3.0 cm. Proximal descending thoracic aorta measures 3.0 cm. Mediastinum/Nodes: No enlarged mediastinal, hilar, or axillary lymph nodes. Thyroid gland, trachea, and esophagus demonstrate no significant findings. Lungs/Pleura: No pneumothorax or pleural effusion is noted. Stable right basilar scarring is noted. No acute consolidative process is noted. Upper Abdomen: Stable hepatic cyst. Stable right renal cyst. Stable nonobstructive left renal calculus. Musculoskeletal: No chest wall abnormality. No acute or significant osseous findings. Review of the MIP images confirms the above findings. IMPRESSION: Stable 4.9 cm ascending thoracic aortic aneurysm. Ascending thoracic aortic aneurysm. Recommend semi-annual imaging followup by CTA or MRA and referral to cardiothoracic surgery if not already obtained. This recommendation follows 2010 ACCF/AHA/AATS/ACR/ASA/SCA/SCAI/SIR/STS/SVM Guidelines for the Diagnosis and Management of Patients With Thoracic Aortic Disease. Circulation. 2010; 121: N829-F621. Aortic Atherosclerosis (ICD10-I70.0). Electronically Signed   By: Marijo Conception, M.D.   On: 07/16/2017 12:16   Ct Angio Chest Aorta W &/or Wo Contrast  Result Date: 08/28/2016 CLINICAL DATA:  Thoracic ascending aortic aneurysm.  Follow-up. EXAM: CT ANGIOGRAPHY CHEST WITH  CONTRAST TECHNIQUE: Multidetector CT imaging of the chest was performed using the standard protocol during bolus administration of intravenous contrast. Multiplanar CT image reconstructions and MIPs were obtained to evaluate the vascular anatomy. Creatinine was obtained on site at [facility] at [address] Results: Creatinine 1.0 mg/dL. CONTRAST:  75 mL Isovue 370 COMPARISON:  12/20/2015 FINDINGS: Cardiovascular: Ascending thoracic aorta near the sinotubular junction measures roughly 4.2 cm and unchanged. The mid ascending thoracic aorta  measures up to 4.9 cm and stable. Proximal aortic arch measures 3.7 cm and stable. Distal aortic arch measures 3.2 cm and stable. The mid ascending thoracic aorta measures 2.9 cm and stable. 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 Overall, there is mild atherosclerotic disease throughout the thoracic aorta. There is also focal plaque at the 4 o'clock position of the aorta at the hiatus on image 104. Celiac trunk, proximal SMA and bilateral renal arteries are patent. There are coronary artery calcifications. Normal caliber of the main pulmonary arteries. Negative for thoracic aortic dissection. Mediastinum/Nodes: No chest lymphadenopathy. Lungs/Pleura: Trachea and mainstem bronchi are patent. Stable scarring in the right lower lobe and along the right major fissure. Otherwise, the lungs are clear. No pleural effusions. Upper Abdomen: Stable 2.5 cm low-density structure in the central aspect of liver probably represents a cyst. There is an exophytic right renal cyst. Normal appearance of the adrenal glands. Small stone in the left kidney without hydronephrosis. Probable small left renal cysts. Musculoskeletal: No chest wall abnormality. No acute or significant osseous findings. Review of the MIP images confirms the above findings. IMPRESSION: Stable fusiform aneurysm of the ascending thoracic aorta  measuring up to 4.9 cm. Recommend semi-annual imaging followup by CTA or MRA and referral to cardiothoracic surgery if not already obtained. This recommendation follows 2010 ACCF/AHA/AATS/ACR/ASA/SCA/SCAI/SIR/STS/SVM Guidelines for the Diagnosis and Management of Patients With Thoracic Aortic Disease. Circulation. 2010; 121: O277-A128 No acute chest abnormality. Electronically Signed   By: Markus Daft M.D.   On: 08/28/2016 14:05       Recent Lab Findings: Lab Results  Component Value Date   WBC 7.6 04/09/2020   HGB 14.0 04/09/2020   HCT 43.3 04/09/2020   PLT 205 04/09/2020   GLUCOSE 101 (H) 04/09/2020   CHOL 140 04/09/2020   TRIG 187 (H) 04/09/2020   HDL 36 (L) 04/09/2020   LDLCALC 72 04/09/2020   ALT 24 04/09/2020   AST 27 04/09/2020   NA 140 04/09/2020   K 4.1 04/09/2020   CL 104 04/09/2020   CREATININE 0.97 04/09/2020   BUN 16 04/09/2020   CO2 23 04/09/2020   TSH 1.540 02/17/2019   INR 1.08 06/27/2015   HGBA1C 5.5 02/17/2019   Ct Angio Chest Aorta W/cm &/or Wo/cm  12/20/2015  CLINICAL DATA:  TAA. Occ chest pain. 10# wt loss. Nonsmoker. No prev surg or hx ca. Prev Ct 2002 purged, report available. EXAM: CT ANGIOGRAPHY CHEST WITH CONTRAST TECHNIQUE: Multidetector CT imaging of the chest was performed using the standard protocol during bolus administration of intravenous contrast. Multiplanar CT image reconstructions and MIPs were obtained to evaluate the vascular anatomy. CONTRAST:  75 cc Isovue 370 COMPARISON:  Report of previous CT exam 07/06/2001, CT of the chest 06/04/2015 from [place]: Heart: Coronary artery calcification is present. Trace pericardial effusion. Heart size is normal. Vascular structures: Ascending aortic aneurysm is 4.8 cm. No dissection. Moderate atherosclerotic calcification of the thoracic aorta. Moderate atherosclerotic calcification of the descending aorta, particularly at the level of the diaphragmatic hiatus. Mediastinum/thyroid: Left  thyroid nodule measures 1.0 cm. No significant mediastinal, hilar, or axillary adenopathy. Lungs/Airways: No pulmonary nodules, pleural effusions, or infiltrates. Upper abdomen: Hypervascular 2.3 cm lesion identified at the dome of the right hepatic lobe, stable in appearance and consistent with benign hemangioma. A stable cyst is seen in the lateral segment of the left hepatic lobe measuring 2.4 cm. Findings are consistent  with benign cyst. Small splenules are incidentally noted in the left upper quadrant the abdomen. Small hiatal hernia. Chest wall/osseous structures: Mild spondylosis of the thoracic spine. No suspicious lytic or blastic lesions are identified. Review of the MIP images confirms the above findings. IMPRESSION: 1. Ascending aortic aneurysm, 4.8 cm. Appearance is stable compared with previous CT of the chest from July 2016. Recommend semi-annual imaging followup by CTA or MRA and referral to cardiothoracic surgery if not already obtained. This recommendation follows 2010 ACCF/AHA/AATS/ACR/ASA/SCA/SCAI/SIR/STS/SVM Guidelines for the Diagnosis and Management of Patients With Thoracic Aortic Disease. Circulation. 2010; 121: Z660-Y301 2. Coronary artery disease. 3. Trace pericardial effusion. 4. Benign appearing left thyroid nodule. No further evaluation is felt to be necessary based on consensus criteria. 5. Stable benign liver lesions. Electronically Signed   By: Nolon Nations M.D.   On: 12/20/2015 15:05      Stress test:done [facility]  CLINICAL DATA:  Chest pain  EXAM: MYOCARDIAL IMAGING WITH SPECT (REST AND PHARMACOLOGIC-STRESS)  GATED LEFT VENTRICULAR WALL MOTION STUDY  LEFT VENTRICULAR EJECTION FRACTION  TECHNIQUE: Standard myocardial SPECT imaging was performed after resting intravenous injection of 11.0 mCi Tc-70m sestamibi. Subsequently, intravenous infusion of Lexiscan was performed under the supervision of the Cardiology staff. At peak effect of the drug, 32.6 mCi  Tc-57m sestamibi was injected intravenously and standard myocardial SPECT imaging was performed. Quantitative gated imaging was also performed to evaluate left ventricular wall motion, and estimate left ventricular ejection fraction.  COMPARISON:  None.  FINDINGS: Perfusion: Regional images of the left ventricular myocardium show no fixed defect to suggest infarct or reversible lesion to suggest ischemic change. No abnormality is identified on the SPECT study.  Wall Motion: Left ventricle is rather prominent. There is mild apical hypokinesis. No other wall motion abnormality is identified in the left ventricle.  Left Ventricular Ejection Fraction: 47 %  End diastolic volume 601 ml  End systolic volume 69 ml  IMPRESSION: 1. No reversible ischemia or infarction.  2. There is left ventricular prominence. There is mild apical hypokinesis. No other wall motion abnormalities are identified.  3. Left ventricular ejection fraction 47%  4. Intermediate risk stress test findings*.  *2012 Appropriate Use Criteria for Coronary Revascularization Focused Update: J Am Coll Cardiol. 0932;35(5):732-202. http://content.airportbarriers.com.aspx?articleid=[email or web address]   Electronically Signed   By: Lowella Grip III M.D.   On: 06/06/2015 13:05  No significant chest pain symptoms reported  No significant arrhythmia noted  This is not a final report, NM images to follow  No significant ST segment changes or arrhythmias were noted during  stress  Uneventful Lexiscan injection.  Nuclear perfusion report to follow under separate cover  CT: CLINICAL DATA:  Epigastric pain. Shortness of breath, nausea. Back pain. History of hypertension, Raynaud's disease, hernia repair.  EXAM: CT ANGIOGRAPHY CHEST, ABDOMEN AND PELVIS  TECHNIQUE: Multidetector CT imaging through the chest, abdomen and pelvis was performed using the standard protocol during bolus administration of intravenous  contrast. Multiplanar reconstructed images and MIPs were obtained and reviewed to evaluate the vascular anatomy.  CONTRAST:  100 cc Omnipaque 350  COMPARISON:  None.  FINDINGS: CTA CHEST FINDINGS  Mediastinum: Fusiform ascending aorta is enlarged, 4.8 cm with moderate calcific atherosclerosis of the aortic arch and descending aorta. Subcentimeter focal intimal hematoma descending aorta. No abnormal density within upper along the course of the aorta. Small amount of free fluid in the is sulcus, normal. No dissection, suspicious luminal irregularity/ulceration, contrast extravasation or periaortic hematoma. Heart size is mildly enlarged. Mild  coronary artery calcifications. No pericardial fluid collection. No mediastinal lymphadenopathy.  Lungs: No pleural effusion or focal consolidation. Minimal pleural thickening/scarring of the RIGHT major fissure. Tracheobronchial tree is patent and midline, no pneumothorax.  Soft tissues and osseous structures: Mild degenerative change of the thoracic spine. No destructive bony lesions. Mild gynecomastia.  Review of the MIP images confirms the above findings.  CTA ABDOMEN AND PELVIS FINDINGS  Retroperitoneum: Aortoiliac vessels are normal in course and caliber with moderate to severe calcific atherosclerosis. Celiac trunk origin is widely patent. Mild calcific atherosclerosis of the origin of the renal arteries, superior mesenteric artery. Inferior mesenteric artery is patent.  Organs: 2.5 cm flash filling hemangioma RIGHT lobe of the liver. 2.3 cm low-density cyst segment 4. No intrahepatic biliary dilatation. Spleen, gallbladder are normal. Fatty pancreas, otherwise unremarkable. Thickened adrenal glands can be seen with hyperplasia without discrete nodule.  GI tract: Small hiatal hernia. Stomach, small large bowel are normal in course and caliber without inflammatory changes though sensitivity may be decreased by lack of enteric  contrast.  GU tract: Kidneys are orthotopic, normal size morphology. 4.7 cm exophytic LEFT lower pole cyst. 3 mm LEFT upper pole nonobstructing nephrolithiasis. 2.2 cm exophytic RIGHT interpolar cyst. Prostate is partially distended. Moderate prostatomegaly invading the base of the bladder.  Peritoneum: No intraperitoneal free fluid or free air. No lymphadenopathy by CT size criteria.  Soft tissues and osseous structures: Moderate to large LEFT, small RIGHT fat containing inguinal hernias. Small fat containing umbilical hernia. Grade 1 L5-S1 anterolisthesis, chronic bilateral L5 pars interarticularis defects, moderate to severe L5-S1 neural foraminal narrowing.  Review of the MIP images confirms the above findings.  IMPRESSION: CTA CHEST: Fusiform 4.8 cm ascending aortic aneurysm without dissection or acute vascular process. Recommend semi-annual imaging followup by CTA or MRA and referral to cardiothoracic surgery if not already obtained. This recommendation follows 2010 ACCF/AHA/AATS/ACR/ASA/SCA/SCAI/SIR/STS/SVM Guidelines for the Diagnosis and Management of Patients With Thoracic Aortic Disease. Circulation. 2010; 121: L373-S287.  No acute cardiopulmonary process.  CTA ABDOMEN AND PELVIS: Atherosclerosis of the aortoiliac vessels without high-grade stenosis or acute vascular process.  No acute intra-abdominal or pelvic process. 3 mm nonobstructing LEFT upper pole nephrolithiasis.  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  Prostatomegaly.   Electronically Signed   By: Elon Alas M.D.   On: 06/05/2015 00:21  ECHO: Transthoracic Echocardiography Report (TTE)  Demographics  Patient Name      Joseph [name]     Gender                Male  Patient Number    [id number]   Race                  Caucasian  Visit Number      [id number]    Room Number           646  Accession Number  [id number]  Date of Study         06/05/2015  Date of Birth     October 14, 1941     Referring  Physician   ED Fay Records, MD  Age               20 year(s)     Sonographer           Marikate Tennis,  RDCS, BS                                   Interpreting          Rozann Lesches, MD                                   Physician Procedure Type of Study  TTE procedure: ECHOCARDIOGRAM FOLLOW UP/ LIMITED W DOPPLER. Procedure date Date: 06/05/2015 Start: 09:20 AM Technical Quality: Adequate visualization Study Location: Portable Indications: Chest pain. Patient Status: Routine Height: 68.11 inches Weight: 255.74 pounds BSA: 2.27 m2 BMI: 38.76 kg/m2 Rhythm: Normal sinus rhythm HR: 82 bpm BP: 116/69 mmHg Conclusions Summary Mild concentric left ventricular hypertrophy Normal left ventricular systolic function with no appreciable segmental abnormality. Mild AR. Signature ------------------------------------------------------------------------------  Electronically signed by Rozann Lesches, MD(Interpreting physician) on 06/05/2015  01:27 PM ------------------------------------------------------------------------------ Findings Mitral Valve Structurally normal mitral valve with good mobility and no significant regurgitation by color flow doppler examination. Aortic Valve Structurally normal aortic valve with good leaflet mobility, and mild regurgitation by color flow doppler examination. Tricuspid Valve Tricuspid valve is structurally normal. Mild tricuspid regurgitation by color flow doppler examination. Pulmonic Valve Pulmonic valve is structurally normal. No Doppler evidence of pulmonic stenosis or insufficiency. Left Atrium Normal size left atrium. Left Ventricle Mild concentric left ventricular hypertrophy Normal left ventricular systolic function with no appreciable segmental abnormality. Right Atrium Mild right atrial enlargement Right Ventricle Mildly dilated right ventricle. Pericardial Effusion No evidence of pericardial  effusion. Miscellaneous Aortic root dimension within normal limits. IVC dilated. M-Mode/2D Measurements & Calculations  LV Diastolic Dimension:  LV Systolic Dimension:     LA Dimension: 3.42 cmAO  5.56 cm                  2.92 cm                    Root Dimension: 3.89 cm  LV FS:47.48 %            LV Volume Diastolic: 540  LV PW Diastolic: 1.4 cm  ml  Septum Diastolic: 9.81   LV Volume Systolic: 19.1  cm                       ml                           LV EDV/LV EDV Index: 127   LA/Aorta: 0.88                           ml/56 m2LV ESV/LV ESV                           Index: 50.5 ml/22 m2                           EF Calculated: 60.24 % Doppler Measurements & Calculations  MV Peak E-Wave: 72.7 cm/s AV Peak Velocity: 111  MV Peak A-Wave: 86.4 cm/s cm/s                     Estimated RVSP: 32.8 mmHg  MV E/A Ratio: 0.84  AV Peak Gradient: 4.93   Estimated RAP:10 mmHg  MV Peak Gradient: 2.11    mmHg  mmHg                                                     PV Peak Velocity: 102  TDI E' Velocity: 15 cm/s                           cm/s                                                     PV Peak Gradient: 4.16                                                     mmHg  E/E' prime 4.9 [address] [place], [place] [address] [phone]   Cardiac Cath: 2016: dR kELLY  Mr. Gains is a 50 -year-old gentleman who has been documented to have a 4.8 cm ascending aortic aneurysm and is followed by Dr. Servando Snare. He was recently seen by Dr. Domenic Polite for Cardiologic evaluation. He has experience mild intermittent episodes of chest pain and shortness of breath. A nuclear study was interpreted as intermediate risk with an ejection fraction at 47% with apical hypokinesis without definitive ischemia. Definitive cardiac catheterization was recommended.   The patient was brought to the second floor  Cardiac cath lab in the postabsorptive state. The patient was premedicated with Versed 2 mg and  fentanyl 50 mcg. A right radial approach was utilized after an Allen's test verified adequate circulation. The right radial artery was punctured via the Seldinger technique, and a 6 [place] Glidesheath Slender was inserted without difficulty. A radial cocktail consisting of Verapamil, IV nitroglycerin, and lidocaine was administered. Weight adjusted heparin was administered. A safety J wire was advanced into the ascending aorta. Diagnostic catheterization was done with a 5 [place] TIG 4.0 catheter. A 5 French pigtail catheter was used for left ventriculography. A TR radial band was applied for hemostasis. The patient left the catheterization laboratory in stable condition. There were no immediate complications during the procedure.      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  Right Coronary Artery  Mid RCA lesion, 35% stenosed.  Wall Motion              Left Heart   Left Ventricle The left ventricular size is normal. There is mild left ventricular systolic dysfunction. The left ventricular ejection fraction is 45-50% by visual estimate. Mild LV dysfunction with an ejection fraction at 45-50% and a subtle area mild mid inferior hypocontractility.    Aorta The ascending aorta is dilated.    Coronary Diagrams   Diagnostic Diagram            Aortic Size Index=  4.8       /Body surface area is 2.39 meters squared. = 2.05    < 2.75 cm/m2  4% risk per year 2.75 to 4.25          8% risk per year > 4.25 cm/m2    20% risk per year    Assessment / Plan:  # 1  Fusiform 4.8 cm ascending aortic aneurysm without dissection or acute vascular process- stable on cta today with a tricuspid aortic valve on echocardiogram without evidence of aortic stenosis or aortic insufficiency #2 Coronary calcifications. The severity of coronary artery disease and any potential stenosis cannot be assessed on this non-gated CT  examination.  3. Dilated right and left pulmonary arteries suggesting pulmonary arterial hypertension.   With the patient's increasing symptoms of dyspnea on exertion increasing pedal edema, will have cardiology consider echocardiogram and further evaluation as appropriate.  CTA suggest possible pulmonary hypertension    Plan to see him back in 10 months with a follow-up CTA of the chest    Grace Isaac MD      [address] [place] [address] Office [phone]   Beeper [phone]  05/24/2020 4:48 PM

## 2020-05-25 ENCOUNTER — Telehealth: Payer: Self-pay | Admitting: *Deleted

## 2020-05-25 DIAGNOSIS — R0602 Shortness of breath: Secondary | ICD-10-CM

## 2020-05-25 NOTE — Telephone Encounter (Signed)
Per Dr. Domenic Polite, we will get him scheduled for an echocardiogram and then an office visit in [facility]

## 2020-05-25 NOTE — Telephone Encounter (Signed)
Patient informed and verbalized understanding of plan. 

## 2020-05-28 NOTE — Progress Notes (Unsigned)
Cardiology Office Note  Date: 05/28/2020   ID: Joseph, [name] 01-03-1941, MRN [id number]  PCP:  Chevis Pretty, FNP  Cardiologist:  Rozann Lesches, MD Electrophysiologist:  None   Chief Complaint: Follow-up ascending aortic aneurysm and shortness of breath.  History of Present Illness: Joseph [name] is a 79 y.o. male with a history of ascending aortic aneurysm, shortness of breath.  Follow-up CTA by Dr. Pia Mau back in June 2020 showed stable ascending thoracic aortic aneurysm measuring 4.8 cm being followed annually.  He was asymptomatic.  Last encounter with Dr. Domenic Polite via telemedicine 11/28/2019.  He denies any anginal symptoms or unusual shortness of breath with typical activities.  He had no symptoms related to his aortic aneurysm.  He had stopped his Lipitor 40 mg several months prior to visit due to leg pain.  Dr. Domenic Polite talked to him about considering low-dose Crestor as an option.  He was continuing to follow-up with Dr. Servando Snare for his aneurysm.  He continued his antihypertensive medication including the Lotensin, HCTZ, Lopressor and Procardia.  He was started on Crestor 5 mg daily.  FLP and LFTs in 3 months.  Recent visit with Dr. Servando Snare on 05/24/2020 patient reported malaise, fatigue, shortness of breath and leg edema.  Recent CT angiogram 05/24/2020 showed stable ascending aortic aneurysm at 4.8 cm.  Echocardiogram performed on 05/25/2020 results are pending.  Past Medical History:  Diagnosis Date  . Anxiety   . Ascending aortic aneurysm ([place])   . Essential hypertension   . GERD (gastroesophageal reflux disease)   . Gout   . History of kidney stones   . Insomnia     Past Surgical History:  Procedure Laterality Date  . CARDIAC CATHETERIZATION N/A 06/27/2015   Procedure: Left Heart Cath and Coronary Angiography;  Surgeon: Troy Sine, MD;  Location: [facility];  Service: Cardiovascular;  Laterality: N/A;  . HERNIA REPAIR    . KIDNEY STONE  SURGERY      Current Outpatient Medications  Medication Sig Dispense Refill  . allopurinol (ZYLOPRIM) 300 MG tablet Take 1 tablet (300 mg total) by mouth daily. 90 tablet 1  . benazepril (LOTENSIN) 20 MG tablet Take 1 tablet (20 mg total) by mouth daily. 90 tablet 1  . clonazePAM (KLONOPIN) 0.5 MG tablet Take 1 tablet (0.5 mg total) by mouth 2 (two) times daily as needed. 60 tablet 2  . fish oil-omega-3 fatty acids 1000 MG capsule Take 2 g by mouth daily.    . hydrochlorothiazide (HYDRODIURIL) 25 MG tablet Take 1 tablet (25 mg total) by mouth daily. 90 tablet 1  . meclizine (ANTIVERT) 25 MG tablet TAKE 1/2 TO 1 (ONE-HALF TO ONE) TABLET BY MOUTH THREE TIMES DAILY AS NEEDED FOR DIZZINESS 30 tablet 0  . Melatonin 5 MG TABS Take 5 mg by mouth at bedtime.    . metoprolol tartrate (LOPRESSOR) 25 MG tablet Take 0.5 tablets (12.5 mg total) by mouth 2 (two) times daily. 90 tablet 1  . Multiple Vitamins-Minerals (COMPLETE MULTIVITAMIN/MINERAL PO) Take 1 tablet by mouth daily.    Marland Kitchen NIFEdipine (PROCARDIA-XL/NIFEDICAL-XL) 30 MG 24 hr tablet Take 1 tablet (30 mg total) by mouth daily. 90 tablet 1  . Potassium 99 MG TABS Take 1 tablet (99 mg total) by mouth daily. 90 tablet 1  . rosuvastatin (CRESTOR) 5 MG tablet Take 1 tablet (5 mg total) by mouth daily. 90 tablet 1  . traMADol (ULTRAM) 50 MG tablet Take 1 tablet (50 mg total) by  mouth 2 (two) times daily. 60 tablet 2  . traZODone (DESYREL) 50 MG tablet Take 0.5-1 tablets (25-50 mg total) by mouth at bedtime as needed. for sleep 90 tablet 1   No current facility-administered medications for this visit.   Allergies:  Carafate [sucralfate], Ciprofloxacin hcl, Prevacid [lansoprazole], Quinolones, and Sulfa antibiotics   Social History: The patient  reports that he quit smoking about 62 years ago. His smoking use included cigarettes. He has a 0.50 pack-year smoking history. He has quit using smokeless tobacco.  His smokeless tobacco use included chew. He  reports that he does not drink alcohol and does not use drugs.   Family History: The patient's family history includes Cancer in his brother; Congestive Heart Failure in his brother; Deep vein thrombosis in his brother; Diabetes in his sister; Heart attack in his brother and father; Heart disease in his brother and father; Leukemia in his brother.   ROS:  Please see the history of present illness. Otherwise, complete review of systems is positive for none.  All other systems are reviewed and negative.   Physical Exam: VS:  There were no vitals taken for this visit., BMI There is no height or weight on file to calculate BMI.  Wt Readings from Last 3 Encounters:  05/24/20 262 lb (118.8 kg)  04/09/20 257 lb (116.6 kg)  01/06/20 257 lb (116.6 kg)    General: Patient appears comfortable at rest. HEENT: Conjunctiva and lids normal, oropharynx clear with moist mucosa. Neck: Supple, no elevated JVP or carotid bruits, no thyromegaly. Lungs: Clear to auscultation, nonlabored breathing at rest. Cardiac: Regular rate and rhythm, no S3 or significant systolic murmur, no pericardial rub. Abdomen: Soft, nontender, no hepatomegaly, bowel sounds present, no guarding or rebound. Extremities: No pitting edema, distal pulses 2+. Skin: Warm and dry. Musculoskeletal: No kyphosis. Neuropsychiatric: Alert and oriented x3, affect grossly appropriate.  ECG:  {EKG/Telemetry Strips [phone]}  Recent Labwork: 04/09/2020: ALT 24; AST 27; BUN 16; Creatinine, Ser 0.97; Hemoglobin 14.0; Platelets 205; Potassium 4.1; Sodium 140     Component Value Date/Time   CHOL 140 04/09/2020 1239   CHOL 208 (H) 03/22/2013 0928   TRIG 187 (H) 04/09/2020 1239   TRIG 142 04/18/2015 0942   TRIG 163 (H) 03/22/2013 0928   HDL 36 (L) 04/09/2020 1239   HDL 43 04/18/2015 0942   HDL 39 (L) 03/22/2013 0928   CHOLHDL 3.9 04/09/2020 1239   LDLCALC 72 04/09/2020 1239   LDLCALC 65 07/10/2014 0936   LDLCALC 136 (H) 03/22/2013  0928    Other Studies Reviewed Today:  Chest CTA 05/24/2020 IMPRESSION: 1. Stable 4.8 cm ascending aortic aneurysm without complicating features. Recommend semi-annual imaging followup by CTA or MRA and referral to cardiothoracic surgery if not already obtained. This recommendation follows 2010 ACCF/AHA/AATS/ACR/ASA/SCA/SCAI/SIR/STS/SVM Guidelines for the Diagnosis and Management of Patients With Thoracic Aortic Disease. Circulation. 2010; 121: O175-Z025. Aortic aneurysm NOS (ICD10-I71.9) 2. Coronary calcifications. The severity of coronary artery disease and any potential stenosis cannot be assessed on this non-gated CT examination. 3. Dilated right and left pulmonary arteries suggesting pulmonary arterial hypertension. 4. 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   Chest CTA 05/19/2019: IMPRESSION: 1. The ascending thoracic aortic aneurysm measures 4.8 cm in greatest dimension, unchanged. Ascending thoracic aortic aneurysm. Recommend semi-annual imaging followup by CTA or MRA and referral to cardiothoracic surgery if not already obtained. This recommendation follows 2010 ACCF/AHA/AATS/ACR/ASA/SCA/SCAI/SIR/STS/SVM Guidelines for the Diagnosis and  Management of Patients With Thoracic Aortic Disease. Circulation. 2010; 121: L076-J518. Aortic aneurysm NOS (ICD10-I71.9) 2. Atherosclerotic changes in the thoracic aorta. Coronary artery calcifications. 3. Hepatic cysts. 4. Mildly complicated renal cyst comparing to previous studies.  Aortic Atherosclerosis (ICD10-I70.0).   Cardiac catheterization 06/27/2015:  Mid LAD lesion, 25% stenosed.  Mid RCA lesion, 35% stenosed.  There is mild left ventricular systolic dysfunction.  Mild LV dysfunction with an ejection fraction of 45-50% with a subtle region of mild mid inferior hypocontractility.  Mild nonobstructive 2 vessel CAD with 25%  smooth stenosis in the LAD immediately after the takeoff of the first diagonal vessel; normal left circumflex artery; dominant right coronary artery with smooth 30-40% mid stenosis.  Assessment and Plan:  1. SOB (shortness of breath)   2. Ascending aortic aneurysm ([place])   3. CAD in native artery   4. Essential hypertension, benign   5. Mixed hyperlipidemia    1. SOB (shortness of breath) ***  2. Ascending aortic aneurysm ([facility]) ***  3. CAD in native artery ***  4. Essential hypertension, benign ***  5. Mixed hyperlipidemia ***  Medication Adjustments/Labs and Tests Ordered: Current medicines are reviewed at length with the patient today.  Concerns regarding medicines are outlined above.   Disposition: Follow-up with ***  Signed, Levell July, NP 05/28/2020 6:57 PM    [place] [facility] [address], [place], [place] [address] Phone: [phone]; Fax: [phone]

## 2020-05-29 ENCOUNTER — Ambulatory Visit (INDEPENDENT_AMBULATORY_CARE_PROVIDER_SITE_OTHER): Payer: Medicare HMO

## 2020-05-29 DIAGNOSIS — R0602 Shortness of breath: Secondary | ICD-10-CM | POA: Diagnosis not present

## 2020-05-29 DIAGNOSIS — I10 Essential (primary) hypertension: Secondary | ICD-10-CM

## 2020-05-29 DIAGNOSIS — E782 Mixed hyperlipidemia: Secondary | ICD-10-CM

## 2020-05-29 DIAGNOSIS — I2510 Atherosclerotic heart disease of native coronary artery without angina pectoris: Secondary | ICD-10-CM

## 2020-05-29 DIAGNOSIS — I7121 Aneurysm of the ascending aorta, without rupture: Secondary | ICD-10-CM

## 2020-05-30 ENCOUNTER — Telehealth: Payer: Self-pay | Admitting: *Deleted

## 2020-05-30 NOTE — Telephone Encounter (Signed)
-----   Message from Satira Sark, MD sent at 05/29/2020  5:04 PM EDT ----- Results reviewed.  Study obtained after shortness of breath reported at [facility] office follow-up.  LVEF remains normal at 55 to 60% with mild diastolic dysfunction, normal RV contraction and pulmonary pressures.  There is mild mitral regurgitation and mildly dilated aortic root.  Office follow-up scheduled, can discuss symptoms further at that time.

## 2020-05-31 ENCOUNTER — Encounter: Payer: Self-pay | Admitting: Cardiology

## 2020-05-31 ENCOUNTER — Ambulatory Visit: Payer: Medicare HMO | Admitting: Cardiology

## 2020-05-31 ENCOUNTER — Other Ambulatory Visit: Payer: Self-pay

## 2020-05-31 VITALS — BP 122/62 | HR 65 | Ht 68.0 in | Wt 264.0 lb

## 2020-05-31 DIAGNOSIS — R0602 Shortness of breath: Secondary | ICD-10-CM | POA: Diagnosis not present

## 2020-05-31 DIAGNOSIS — I712 Thoracic aortic aneurysm, without rupture: Secondary | ICD-10-CM

## 2020-05-31 DIAGNOSIS — I10 Essential (primary) hypertension: Secondary | ICD-10-CM | POA: Diagnosis not present

## 2020-05-31 DIAGNOSIS — I7121 Aneurysm of the ascending aorta, without rupture: Secondary | ICD-10-CM

## 2020-05-31 MED ORDER — FUROSEMIDE 20 MG PO TABS
20.0000 mg | ORAL_TABLET | Freq: Every day | ORAL | 1 refills | Status: DC
Start: 2020-05-31 — End: 2020-07-05

## 2020-05-31 NOTE — Telephone Encounter (Signed)
Patient informed during office visit today. Copy sent to PCP °

## 2020-05-31 NOTE — Patient Instructions (Addendum)
Medication Instructions:   Your physician has recommended you make the following change in your medication:   Stop hydrochlorothiazide  Start furosemide 20 mg by mouth daily  Continue other medications the same  Labwork:  Your physician recommends that you return for lab work in: 7-10 days to check your BMET. This may be done at [facility].  Testing/Procedures:  NONE  Follow-Up:  Your physician recommends that you schedule a follow-up appointment in: 1 month with Katina Dung NP (office).  Any Other Special Instructions Will Be Listed Below (If Applicable).  If you need a refill on your cardiac medications before your next appointment, please call your pharmacy.

## 2020-05-31 NOTE — Progress Notes (Signed)
Cardiology Office Note  Date: 05/31/2020   ID: SAAMIR ARMSTRONG, DOB [birth year], MRN [id number]  PCP:  Chevis Pretty, FNP  Cardiologist:  Rozann Lesches, MD Electrophysiologist:  None   Chief Complaint  Patient presents with  . Cardiac follow-up    History of Present Illness: Joseph [name] is a 79 y.o. male last assessed via telehealth encounter in December 2020.  He is referred back to the office after recent visit with Dr. Servando Snare for follow-up of ascending thoracic aortic aneurysm, stable by chest CTA at 4.8 cm.  Concern was for follow-up of dyspnea on exertion and leg swelling.  He is here today with his wife.  Chest CTA showed coronary calcifications and dilated pulmonary arteries suggesting pulmonary arterial hypertension although he had no prior history documented.  Cardiac catheterization from 2016 showed mild coronary atherosclerosis.  Follow-up echocardiogram was performed on June 29 showing LVEF 55 to 60% with mild diastolic dysfunction, normal RV contraction and estimated RVSP, severely dilated left atrium with mild mitral regurgitation, mild dilated aortic root with mild aortic regurgitation.  I personally reviewed his ECG today which shows normal sinus rhythm with low voltage and leftward axis, decreased R wave progression, nonspecific T wave changes.  He does not report escalating shortness of breath, more chronic symptoms, leg swelling fluctuates.  In review of his medications he is on HCTZ, no other diuretic treatment.  Past Medical History:  Diagnosis Date  . Anxiety   . Ascending aortic aneurysm ([place])   . Essential hypertension   . GERD (gastroesophageal reflux disease)   . Gout   . History of kidney stones   . Insomnia     Past Surgical History:  Procedure Laterality Date  . CARDIAC CATHETERIZATION N/A 06/27/2015   Procedure: Left Heart Cath and Coronary Angiography;  Surgeon: Troy Sine, MD;  Location: [facility];  Service:  Cardiovascular;  Laterality: N/A;  . HERNIA REPAIR    . KIDNEY STONE SURGERY      Current Outpatient Medications  Medication Sig Dispense Refill  . allopurinol (ZYLOPRIM) 300 MG tablet Take 1 tablet (300 mg total) by mouth daily. 90 tablet 1  . benazepril (LOTENSIN) 20 MG tablet Take 1 tablet (20 mg total) by mouth daily. 90 tablet 1  . clonazePAM (KLONOPIN) 0.5 MG tablet Take 1 tablet (0.5 mg total) by mouth 2 (two) times daily as needed. 60 tablet 2  . fish oil-omega-3 fatty acids 1000 MG capsule Take 2 g by mouth daily.    . meclizine (ANTIVERT) 25 MG tablet TAKE 1/2 TO 1 (ONE-HALF TO ONE) TABLET BY MOUTH THREE TIMES DAILY AS NEEDED FOR DIZZINESS 30 tablet 0  . Melatonin 5 MG TABS Take 5 mg by mouth at bedtime.    . metoprolol tartrate (LOPRESSOR) 25 MG tablet Take 0.5 tablets (12.5 mg total) by mouth 2 (two) times daily. 90 tablet 1  . Multiple Vitamins-Minerals (COMPLETE MULTIVITAMIN/MINERAL PO) Take 1 tablet by mouth daily.    Marland Kitchen NIFEdipine (PROCARDIA-XL/NIFEDICAL-XL) 30 MG 24 hr tablet Take 1 tablet (30 mg total) by mouth daily. 90 tablet 1  . Potassium 99 MG TABS Take 1 tablet (99 mg total) by mouth daily. 90 tablet 1  . rosuvastatin (CRESTOR) 5 MG tablet Take 1 tablet (5 mg total) by mouth daily. 90 tablet 1  . traMADol (ULTRAM) 50 MG tablet Take 1 tablet (50 mg total) by mouth 2 (two) times daily. 60 tablet 2  . traZODone (DESYREL) 50 MG tablet Take  0.5-1 tablets (25-50 mg total) by mouth at bedtime as needed. for sleep 90 tablet 1  . furosemide (LASIX) 20 MG tablet Take 1 tablet (20 mg total) by mouth daily. 90 tablet 1   No current facility-administered medications for this visit.   Allergies:  Carafate [sucralfate], Ciprofloxacin hcl, Prevacid [lansoprazole], Quinolones, and Sulfa antibiotics   ROS:   No palpitations or syncope.  Physical Exam: VS:  BP 122/62   Pulse 65   Ht 5\' 8"  (1.727 m)   Wt 264 lb (119.7 kg)   SpO2 96%   BMI 40.14 kg/m , BMI Body mass index is  40.14 kg/m.  Wt Readings from Last 3 Encounters:  05/31/20 264 lb (119.7 kg)  05/24/20 262 lb (118.8 kg)  04/09/20 257 lb (116.6 kg)    General:  Morbidly obese male, no distress. HEENT: Conjunctiva and lids normal, wearing a mask. Neck: Supple, no elevated JVP or carotid bruits, no thyromegaly. Lungs: Clear to auscultation, nonlabored breathing at rest. Cardiac: Regular rate and rhythm, no S3, soft systolic murmur. Abdomen: Protuberant, bowel sounds present, no guarding or rebound. Extremities: 2+ lower leg edema.  ECG:  An ECG dated 08/30/2018 was personally reviewed today and demonstrated:  Sinus rhythm with leftward axis and nonspecific ST-T changes.  Recent Labwork: 04/09/2020: ALT 24; AST 27; BUN 16; Creatinine, Ser 0.97; Hemoglobin 14.0; Platelets 205; Potassium 4.1; Sodium 140     Component Value Date/Time   CHOL 140 04/09/2020 1239   CHOL 208 (H) 03/22/2013 0928   TRIG 187 (H) 04/09/2020 1239   TRIG 142 04/18/2015 0942   TRIG 163 (H) 03/22/2013 0928   HDL 36 (L) 04/09/2020 1239   HDL 43 04/18/2015 0942   HDL 39 (L) 03/22/2013 0928   CHOLHDL 3.9 04/09/2020 1239   LDLCALC 72 04/09/2020 1239   LDLCALC 65 07/10/2014 0936   LDLCALC 136 (H) 03/22/2013 0928    Other Studies Reviewed Today:  Cardiac catheterization 06/27/2015:  Mid LAD lesion, 25% stenosed.  Mid RCA lesion, 35% stenosed.  There is mild left ventricular systolic dysfunction.  Mild LV dysfunction with an ejection fraction of 45-50% with a subtle region of mild mid inferior hypocontractility.  Mild nonobstructive 2 vessel CAD with 25% smooth stenosis in the LAD immediately after the takeoff of the first diagonal vessel; normal left circumflex artery; dominant right coronary artery with smooth 30-40% mid stenosis.  Chest CTA 05/24/2020: IMPRESSION: 1. Stable 4.8 cm ascending aortic aneurysm without complicating features. Recommend semi-annual imaging followup by CTA or MRA and referral to  cardiothoracic surgery if not already obtained. This recommendation follows 2010 ACCF/AHA/AATS/ACR/ASA/SCA/SCAI/SIR/STS/SVM Guidelines for the Diagnosis and Management of Patients With Thoracic Aortic Disease. Circulation. 2010; 121: S283-T517. Aortic aneurysm NOS (ICD10-I71.9) 2. Coronary calcifications. The severity of coronary artery disease and any potential stenosis cannot be assessed on this non-gated CT examination. 3. Dilated right and left pulmonary arteries suggesting pulmonary arterial hypertension. 4. 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  Echocardiogram 05/29/2020: 1. Left ventricular ejection fraction, by estimation, is 55 to 60%. The  left ventricle has normal function. The left ventricle has no regional  wall motion abnormalities. Left ventricular diastolic parameters are  consistent with Grade I diastolic  dysfunction (impaired relaxation).  2. Right ventricular systolic function is normal. The right ventricular  size is normal. There is normal pulmonary artery systolic pressure.  3. Left atrial size was severely dilated.  4. Right atrial size  was mildly dilated.  5. The mitral valve is normal in structure. Mild mitral valve  regurgitation. No evidence of mitral stenosis.  6. The aortic valve is tricuspid. Aortic valve regurgitation is mild. No  aortic stenosis is present.  7. Aortic dilatation noted. 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  8. The inferior vena cava is normal in size with greater than 50%  respiratory variability, suggesting right atrial pressure of 3 mmHg.   Assessment and Plan:  1.  Chronic shortness of breath with intermittent leg swelling.  LVEF normal by follow-up echocardiogram in the range of 55 to 60% with mild diastolic dysfunction, also normal estimated pulmonary pressures (recent chest CTA indicated enlarged pulmonary  arteries).  Plan will be to switch from HCTZ to Lasix starting at 20 mg daily, continue potassium supplement, check BMET in 7 to 10 days.  We will bring him back for clinical follow-up, may need higher dose adjustment.  2.  Nonobstructive coronary atherosclerosis by cardiac catheterization in 2016.  He does not describe any definitive angina at this time.  He is on statin therapy.  3.  Essential hypertension, blood pressure is well controlled today on Lotensin, Lopressor, and Procardia XL.  4.  Stable ascending thoracic aortic aneurysm, 4.8 cm by recent chest CTA and asymptomatic.  Medication Adjustments/Labs and Tests Ordered: Current medicines are reviewed at length with the patient today.  Concerns regarding medicines are outlined above.   Tests Ordered: Orders Placed This Encounter  Procedures  . Basic metabolic panel  . EKG 12-Lead    Medication Changes: Meds ordered this encounter  Medications  . furosemide (LASIX) 20 MG tablet    Sig: Take 1 tablet (20 mg total) by mouth daily.    Dispense:  90 tablet    Refill:  1    05/31/2020 NEW-stop hydrochlorothiazide    Disposition:  Follow up 4 weeks with Jonni Sanger in the [place] office.  Signed, Satira Sark, MD, [facility] 05/31/2020 9:51 AM    [facility] at [place], [place], [place] [address] Phone: [phone]; Fax: [phone]

## 2020-06-08 ENCOUNTER — Other Ambulatory Visit: Payer: Self-pay

## 2020-06-08 ENCOUNTER — Other Ambulatory Visit: Payer: Medicare HMO

## 2020-06-08 ENCOUNTER — Other Ambulatory Visit: Payer: Self-pay | Admitting: *Deleted

## 2020-06-08 DIAGNOSIS — I10 Essential (primary) hypertension: Secondary | ICD-10-CM | POA: Diagnosis not present

## 2020-06-08 DIAGNOSIS — I7121 Aneurysm of the ascending aorta, without rupture: Secondary | ICD-10-CM

## 2020-06-08 DIAGNOSIS — R0602 Shortness of breath: Secondary | ICD-10-CM

## 2020-06-08 DIAGNOSIS — I712 Thoracic aortic aneurysm, without rupture: Secondary | ICD-10-CM | POA: Diagnosis not present

## 2020-06-09 LAB — BASIC METABOLIC PANEL
BUN/Creatinine Ratio: 16 (ref 10–24)
BUN: 16 mg/dL (ref 8–27)
CO2: 22 mmol/L (ref 20–29)
Calcium: 9.1 mg/dL (ref 8.6–10.2)
Chloride: 107 mmol/L — ABNORMAL HIGH (ref 96–106)
Creatinine, Ser: 1.01 mg/dL (ref 0.76–1.27)
GFR calc Af Amer: 83 mL/min/{1.73_m2} (ref >59)
GFR calc non Af Amer: 71 mL/min/{1.73_m2} (ref >59)
Glucose: 103 mg/dL — ABNORMAL HIGH (ref 65–99)
Potassium: 3.9 mmol/L (ref 3.5–5.2)
Sodium: 144 mmol/L (ref 134–144)

## 2020-06-12 ENCOUNTER — Telehealth: Payer: Self-pay | Admitting: *Deleted

## 2020-06-12 NOTE — Telephone Encounter (Signed)
-----   Message from Satira Sark, MD sent at 06/09/2020  8:50 AM EDT ----- Results reviewed. Renal function stable on Lasix, potassium normal. Keep follow-up plan.

## 2020-06-12 NOTE — Telephone Encounter (Signed)
Patient informed. Copy sent to PCP °

## 2020-06-15 ENCOUNTER — Ambulatory Visit: Payer: Medicare HMO | Admitting: *Deleted

## 2020-06-15 DIAGNOSIS — E785 Hyperlipidemia, unspecified: Secondary | ICD-10-CM

## 2020-06-15 DIAGNOSIS — I7121 Aneurysm of the ascending aorta, without rupture: Secondary | ICD-10-CM

## 2020-06-15 DIAGNOSIS — I10 Essential (primary) hypertension: Secondary | ICD-10-CM

## 2020-06-15 NOTE — Chronic Care Management (AMB) (Signed)
°  Chronic Care Management   Initial Visit Outreach  06/15/2020 Name: Joseph [name] MRN: [id number] DOB: 02-24-1941  Referred by: Chevis Pretty, FNP Reason for referral : Chronic Care Management (RN Initial Visit)   An unsuccessful Initial Telephone Visit was attempted today. The patient was referred to the case management team for assistance with care management and care coordination.   RN Care Plan    Chronic Disease Management Needs       CARE PLAN ENTRY (see longtitudinal plan of care for additional care plan information)  Current Barriers:   Chronic Disease Management support, education, and care coordination needs related to HTN, Aortic aneurysm, BPH, HLD  Clinical Goals:  Over the next 10 days, patient will be contacted by a Care Guide to reschedule their Initial CCM Visit  Over the next 30 days, patient will have an Initial CCM Visit with a member of the embedded CCM team to discuss self-management of their chronic medical conditions  Interventions:  Chart reviewed in preparation for initial visit telephone call  Collaboration with other care team members as needed  Unsuccessful outreach to patient   A HIPPA compliant phone message was left for the patient providing contact information and requesting a return call.   Request sent to care guides to reach out and reschedule patient's initial visit  Patient Self Care Activities:  Undetermined   Initial goal documentation         Follow Up Plan: A HIPPA compliant phone message was left for the patient providing contact information and requesting a return call.  The care management team will reach out to the patient again over the next 10 days.   Chong Sicilian, BSN, RN-BC Embedded Chronic Care Manager [facility] Direct Dial: [phone]

## 2020-06-18 ENCOUNTER — Telehealth: Payer: Self-pay | Admitting: Nurse Practitioner

## 2020-06-18 NOTE — Chronic Care Management (AMB) (Signed)
  Care Management   Note  06/18/2020 Name: [name] MRN: [id number] DOB: 1941-01-06  Joseph [name] is a 79 y.o. year old male who is a primary care patient of Chevis Pretty, [place] and is actively engaged with the care management team. I reached out to Oren Bracket by phone today to assist with re-scheduling an initial visit with the RN Case Manager  Follow up plan: Unsuccessful telephone outreach attempt made. A HIPPA compliant phone message was left for the patient providing contact information and requesting a return call.  The care management team will reach out to the patient again over the next 7 days.  If patient returns call to provider office, please advise to call [facility]  at [place], [place], [place], [place] [address] Direct Dial: [phone] [email or web address]: [email or web address]

## 2020-06-18 NOTE — Chronic Care Management (AMB) (Signed)
Chronic Care Management   Note  06/18/2020 Name: Joseph [name] MRN: [id number] DOB: 04-Dec-1940  Joseph [name] is a 79 y.o. year old male who is a primary care patient of Chevis Pretty, FNP. I reached out to Oren Bracket by phone today in response to a referral sent by Joseph [name] health plan.     Joseph [name] was given information about Chronic Care Management services today including:  1. CCM service includes personalized support from designated clinical staff supervised by his physician, including individualized plan of care and coordination with other care providers 2. 24/7 contact phone numbers for assistance for urgent and routine care needs. 3. Service will only be billed when office clinical staff spend 20 minutes or more in a month to coordinate care. 4. Only one practitioner may furnish and bill the service in a calendar month. 5. The patient may stop CCM services at any time (effective at the end of the month) by phone call to the office staff. 6. The patient will be responsible for cost sharing (co-pay) of up to 20% of the service fee (after annual deductible is met).  Patient did not agree to enrollment in care management services and does not wish to consider at this time.  Follow up plan: Patient declines further follow up and engagement by the care management team. Appropriate care team members and provider have been notified via electronic communication.   Noreene Larsson, [place], [place], [place] [address] Direct Dial: [phone] [email or web address]: [email or web address]

## 2020-07-03 ENCOUNTER — Other Ambulatory Visit: Payer: Self-pay | Admitting: Nurse Practitioner

## 2020-07-03 DIAGNOSIS — G629 Polyneuropathy, unspecified: Secondary | ICD-10-CM

## 2020-07-04 NOTE — Progress Notes (Signed)
Cardiology Office Note  Date: 07/05/2020   ID: Joseph, [name] 02/07/1941, MRN [id number]  PCP:  Chevis Pretty, FNP  Cardiologist:  Rozann Lesches, MD Electrophysiologist:  None   Chief Complaint: Follow-up shortness of breath  History of Present Illness: Joseph [name] is a 79 y.o. male with a history of shortness of breath, ascending thoracic aortic aneurysm. Last CT showed stable ascending thoracic aneurysm at 4.8 cm.  Concern was for follow-up of dyspnea on exertion and leg swelling.  Chest CTA showed coronary calcification and dilated pulmonary artery suggesting pulmonary arterial hypertension with no previous history documented.  Cardiac cath in 2016 showed mild CAD.  Echocardiogram May 29, 2020 showed EF 55 to 60% with mild diastolic dysfunction, normal RV contraction and estimated RVSP, severely dilated left atrium and mild mitral regurgitation, mildly dilated aortic root with mild aortic regurgitation.  At last appointment with Dr. Domenic Polite on 05/31/2020 he did not report any increasing shortness of breath or more chronic symptoms.  He was only taking HCTZ.  Plan was to switch from HCTZ to Lasix starting 20 mg daily.  Continue potassium supplement and check BMP in 7 to 10 days.  To bring him back for clinical follow-up and may need higher dose adjustment.  His blood pressure was well controlled at that time.  His ascending thoracicaortic aneurysm was stable at 4.8 cm by recent chest CT.  Nonobstructive CAD back catheter 2016.  No angina.  He presents today for follow-up.  He states I do not believe the medication that I was switched to is helping with the fluid very much.  He states I am not urinating as much as I was on the previous medication.  He denies any increase in exertional dyspnea.  Continues with some mild lower extremity edema bilaterally.  Otherwise denies any anginal or exertional symptoms, palpitations or arrhythmias, PND, orthopnea.  Past Medical History:    Diagnosis Date  . Anxiety   . Ascending aortic aneurysm ([place])   . Essential hypertension   . GERD (gastroesophageal reflux disease)   . Gout   . History of kidney stones   . Insomnia     Past Surgical History:  Procedure Laterality Date  . CARDIAC CATHETERIZATION N/A 06/27/2015   Procedure: Left Heart Cath and Coronary Angiography;  Surgeon: Troy Sine, MD;  Location: [facility];  Service: Cardiovascular;  Laterality: N/A;  . HERNIA REPAIR    . KIDNEY STONE SURGERY      Current Outpatient Medications  Medication Sig Dispense Refill  . allopurinol (ZYLOPRIM) 300 MG tablet Take 1 tablet (300 mg total) by mouth daily. 90 tablet 1  . benazepril (LOTENSIN) 20 MG tablet Take 1 tablet (20 mg total) by mouth daily. 90 tablet 1  . clonazePAM (KLONOPIN) 0.5 MG tablet Take 1 tablet (0.5 mg total) by mouth 2 (two) times daily as needed. 60 tablet 2  . fish oil-omega-3 fatty acids 1000 MG capsule Take 2 g by mouth daily.    . furosemide (LASIX) 20 MG tablet Take 1 tablet (20 mg total) by mouth daily. 90 tablet 1  . meclizine (ANTIVERT) 25 MG tablet TAKE 1/2 TO 1 (ONE-HALF TO ONE) TABLET BY MOUTH THREE TIMES DAILY AS NEEDED FOR DIZZINESS 30 tablet 0  . Melatonin 5 MG TABS Take 5 mg by mouth at bedtime.    . metoprolol tartrate (LOPRESSOR) 25 MG tablet Take 0.5 tablets (12.5 mg total) by mouth 2 (two) times daily. 90 tablet 1  .  Multiple Vitamins-Minerals (COMPLETE MULTIVITAMIN/MINERAL PO) Take 1 tablet by mouth daily.    Marland Kitchen NIFEdipine (PROCARDIA-XL/NIFEDICAL-XL) 30 MG 24 hr tablet Take 1 tablet (30 mg total) by mouth daily. 90 tablet 1  . Potassium 99 MG TABS Take 1 tablet (99 mg total) by mouth daily. 90 tablet 1  . rosuvastatin (CRESTOR) 5 MG tablet Take 1 tablet (5 mg total) by mouth daily. 90 tablet 1  . traMADol (ULTRAM) 50 MG tablet Take 1 tablet (50 mg total) by mouth 2 (two) times daily. 60 tablet 2  . traZODone (DESYREL) 50 MG tablet Take 0.5-1 tablets (25-50 mg total) by  mouth at bedtime as needed. for sleep 90 tablet 1   No current facility-administered medications for this visit.   Allergies:  Carafate [sucralfate], Ciprofloxacin hcl, Prevacid [lansoprazole], Quinolones, and Sulfa antibiotics   Social History: The patient  reports that he quit smoking about 62 years ago. His smoking use included cigarettes. He has a 0.50 pack-year smoking history. He has quit using smokeless tobacco.  His smokeless tobacco use included chew. He reports that he does not drink alcohol and does not use drugs.   Family History: The patient's family history includes Cancer in his brother; Congestive Heart Failure in his brother; Deep vein thrombosis in his brother; Diabetes in his sister; Heart attack in his brother and father; Heart disease in his brother and father; Leukemia in his brother.   ROS:  Please see the history of present illness. Otherwise, complete review of systems is positive for none.  All other systems are reviewed and negative.   Physical Exam: VS:  BP 138/82   Pulse 70   Ht 5\' 8"  (1.727 m)   Wt 265 lb 6.4 oz (120.4 kg)   SpO2 95%   BMI 40.35 kg/m , BMI Body mass index is 40.35 kg/m.  Wt Readings from Last 3 Encounters:  07/05/20 265 lb 6.4 oz (120.4 kg)  05/31/20 264 lb (119.7 kg)  05/24/20 262 lb (118.8 kg)    General: Obese patient appears comfortable at rest. Neck: Supple, no elevated JVP or carotid bruits, no thyromegaly. Lungs: Clear to auscultation, nonlabored breathing at rest. Cardiac: Regular rate and rhythm, no S3 or significant systolic murmur, no pericardial rub. Extremities: Mild non-pitting edema, distal pulses 2+. Skin: Warm and dry. Musculoskeletal: No kyphosis. Neuropsychiatric: Alert and oriented x3, affect grossly appropriate.  ECG:  EKG May 31, 2020 showed normal sinus rhythm rate of 63, left axis deviation.  Recent Labwork: 04/09/2020: ALT 24; AST 27; Hemoglobin 14.0; Platelets 205 06/08/2020: BUN 16; Creatinine, Ser 1.01;  Potassium 3.9; Sodium 144     Component Value Date/Time   CHOL 140 04/09/2020 1239   CHOL 208 (H) 03/22/2013 0928   TRIG 187 (H) 04/09/2020 1239   TRIG 142 04/18/2015 0942   TRIG 163 (H) 03/22/2013 0928   HDL 36 (L) 04/09/2020 1239   HDL 43 04/18/2015 0942   HDL 39 (L) 03/22/2013 0928   CHOLHDL 3.9 04/09/2020 1239   LDLCALC 72 04/09/2020 1239   LDLCALC 65 07/10/2014 0936   LDLCALC 136 (H) 03/22/2013 0928    Other Studies Reviewed Today:  Cardiac catheterization 06/27/2015:  Mid LAD lesion, 25% stenosed.  Mid RCA lesion, 35% stenosed.  There is mild left ventricular systolic dysfunction.  Mild LV dysfunction with an ejection fraction of 45-50% with a subtle region of mild mid inferior hypocontractility.  Mild nonobstructive 2 vessel CAD with 25% smooth stenosis in the LAD immediately after the takeoff of the  first diagonal vessel; normal left circumflex artery; dominant right coronary artery with smooth 30-40% mid stenosis.  Chest CTA 05/24/2020: IMPRESSION: 1. Stable 4.8 cm ascending aortic aneurysm without complicating features. Recommend semi-annual imaging followup by CTA or MRA and referral to cardiothoracic surgery if not already obtained. This recommendation follows 2010 ACCF/AHA/AATS/ACR/ASA/SCA/SCAI/SIR/STS/SVM Guidelines for the Diagnosis and Management of Patients With Thoracic Aortic Disease. Circulation. 2010; 121: L373-S287. Aortic aneurysm NOS (ICD10-I71.9) 2. Coronary calcifications. The severity of coronary artery disease and any potential stenosis cannot be assessed on this non-gated CT examination. 3. Dilated right and left pulmonary arteries suggesting pulmonary arterial hypertension. 4. 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  Echocardiogram 05/29/2020: 1. Left ventricular ejection fraction, by estimation, is 55 to 60%. The  left ventricle has normal function.  The left ventricle has no regional  wall motion abnormalities. Left ventricular diastolic parameters are  consistent with Grade I diastolic  dysfunction (impaired relaxation).  2. Right ventricular systolic function is normal. The right ventricular  size is normal. There is normal pulmonary artery systolic pressure.  3. Left atrial size was severely dilated.  4. Right atrial size was mildly dilated.  5. The mitral valve is normal in structure. Mild mitral valve  regurgitation. No evidence of mitral stenosis.  6. The aortic valve is tricuspid. Aortic valve regurgitation is mild. No  aortic stenosis is present.  7. Aortic dilatation noted. 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  8. The inferior vena cava is normal in size with greater than 50%  respiratory variability, suggesting right atrial pressure of 3 mmHg.     Assessment and Plan:  1. Chronic shortness of breath   2. CAD in native artery   3. Essential hypertension, benign   4. Thoracic ascending aortic aneurysm ([place])    1. Chronic shortness of breath HCTZ changed to Lasix 20 mg daily with potassium supplement at last visit.  He presents today stating he does not think the new medication is helping much with his edema.  He states I am not urinating as much as I was on the previous medication.  Increased dose of Lasix to 40 mg daily.  Get a BMP and magnesium in 2 weeks.  2. CAD in native artery Denies any anginal or exertional symptoms.  3. Essential hypertension, benign Blood pressure today is 138/82.  Continue benazepril 20 mg p.o. daily.  Metoprolol 25 mg p.o. twice daily, nifedipine 30 mg daily.  4. Thoracic ascending aortic aneurysm ([facility]) 4.8 cm on chest CTA 05/24/2020. Radiology recommended semi annual follow up with CTA or MRA and referral to CTS.  Will follow with further CTAs.  Patient states he is frustrated with doctors telling him he needs to continue to have CTAs to measure his  aneurysm.  I discussed this with the patient regarding need for follow-up exams.  I informed him if the aneurysm gets large enough he will need to see a surgeon and discuss possible surgical intervention to repair.  Patient understands.  We will need to schedule a follow-up CTA in 6 months.  Medication Adjustments/Labs and Tests Ordered: Current medicines are reviewed at length with the patient today.  Concerns regarding medicines are outlined above.   Disposition: Follow-up with Dr. Domenic Polite or APP 6 months  Signed, Levell July, NP 07/05/2020 10:56 AM    [facility] at [place], [place], [place] [address]  Phone: [phone]; Fax: [phone]

## 2020-07-05 ENCOUNTER — Encounter: Payer: Self-pay | Admitting: Family Medicine

## 2020-07-05 ENCOUNTER — Ambulatory Visit: Payer: Medicare HMO | Admitting: Family Medicine

## 2020-07-05 VITALS — BP 138/82 | HR 70 | Ht 68.0 in | Wt 265.4 lb

## 2020-07-05 DIAGNOSIS — I2510 Atherosclerotic heart disease of native coronary artery without angina pectoris: Secondary | ICD-10-CM | POA: Diagnosis not present

## 2020-07-05 DIAGNOSIS — I712 Thoracic aortic aneurysm, without rupture: Secondary | ICD-10-CM | POA: Diagnosis not present

## 2020-07-05 DIAGNOSIS — Z79899 Other long term (current) drug therapy: Secondary | ICD-10-CM

## 2020-07-05 DIAGNOSIS — I10 Essential (primary) hypertension: Secondary | ICD-10-CM | POA: Diagnosis not present

## 2020-07-05 DIAGNOSIS — R0602 Shortness of breath: Secondary | ICD-10-CM | POA: Diagnosis not present

## 2020-07-05 DIAGNOSIS — I7121 Aneurysm of the ascending aorta, without rupture: Secondary | ICD-10-CM

## 2020-07-05 MED ORDER — FUROSEMIDE 40 MG PO TABS
40.0000 mg | ORAL_TABLET | Freq: Every day | ORAL | 3 refills | Status: DC
Start: 2020-07-05 — End: 2020-07-17

## 2020-07-05 NOTE — Patient Instructions (Signed)
Medication Instructions:   Your physician has recommended you make the following change in your medication:   Increase furosemide to 40 mg daily. You may take (2) of your 20 mg daily until finished  Continue other medications the same  Labwork:  Your physician recommends that you return for non-fasting lab work in: 2 weeks to check your BMET & Mg levels. This may be done at your family doctor's office.   Testing/Procedures:  NONE  Follow-Up:  Your physician recommends that you schedule a follow-up appointment in: 6 months.  Any Other Special Instructions Will Be Listed Below (If Applicable).  If you need a refill on your cardiac medications before your next appointment, please call your pharmacy.

## 2020-07-11 ENCOUNTER — Other Ambulatory Visit: Payer: Self-pay | Admitting: Nurse Practitioner

## 2020-07-11 DIAGNOSIS — G629 Polyneuropathy, unspecified: Secondary | ICD-10-CM

## 2020-07-13 ENCOUNTER — Other Ambulatory Visit: Payer: Self-pay | Admitting: Nurse Practitioner

## 2020-07-13 DIAGNOSIS — G629 Polyneuropathy, unspecified: Secondary | ICD-10-CM

## 2020-07-17 ENCOUNTER — Other Ambulatory Visit: Payer: Self-pay

## 2020-07-17 ENCOUNTER — Encounter: Payer: Self-pay | Admitting: Nurse Practitioner

## 2020-07-17 ENCOUNTER — Ambulatory Visit (INDEPENDENT_AMBULATORY_CARE_PROVIDER_SITE_OTHER): Payer: Medicare HMO | Admitting: Nurse Practitioner

## 2020-07-17 VITALS — BP 135/71 | HR 67 | Temp 97.4°F | Resp 20 | Ht 68.0 in | Wt 262.0 lb

## 2020-07-17 DIAGNOSIS — F5101 Primary insomnia: Secondary | ICD-10-CM

## 2020-07-17 DIAGNOSIS — E876 Hypokalemia: Secondary | ICD-10-CM

## 2020-07-17 DIAGNOSIS — F411 Generalized anxiety disorder: Secondary | ICD-10-CM

## 2020-07-17 DIAGNOSIS — E785 Hyperlipidemia, unspecified: Secondary | ICD-10-CM | POA: Diagnosis not present

## 2020-07-17 DIAGNOSIS — Z79899 Other long term (current) drug therapy: Secondary | ICD-10-CM | POA: Diagnosis not present

## 2020-07-17 DIAGNOSIS — I712 Thoracic aortic aneurysm, without rupture: Secondary | ICD-10-CM | POA: Diagnosis not present

## 2020-07-17 DIAGNOSIS — I7300 Raynaud's syndrome without gangrene: Secondary | ICD-10-CM | POA: Diagnosis not present

## 2020-07-17 DIAGNOSIS — G629 Polyneuropathy, unspecified: Secondary | ICD-10-CM | POA: Diagnosis not present

## 2020-07-17 DIAGNOSIS — M1A09X0 Idiopathic chronic gout, multiple sites, without tophus (tophi): Secondary | ICD-10-CM | POA: Diagnosis not present

## 2020-07-17 DIAGNOSIS — R0602 Shortness of breath: Secondary | ICD-10-CM | POA: Diagnosis not present

## 2020-07-17 DIAGNOSIS — I7121 Aneurysm of the ascending aorta, without rupture: Secondary | ICD-10-CM

## 2020-07-17 DIAGNOSIS — N400 Enlarged prostate without lower urinary tract symptoms: Secondary | ICD-10-CM | POA: Diagnosis not present

## 2020-07-17 DIAGNOSIS — I10 Essential (primary) hypertension: Secondary | ICD-10-CM | POA: Diagnosis not present

## 2020-07-17 MED ORDER — TRAMADOL HCL 50 MG PO TABS: 50.0000 mg | ORAL_TABLET | Freq: Two times a day (BID) | ORAL | 2 refills | Status: DC

## 2020-07-17 MED ORDER — NIFEDIPINE ER OSMOTIC RELEASE 30 MG PO TB24: 30.0000 mg | ORAL_TABLET | Freq: Every day | ORAL | 1 refills | Status: DC

## 2020-07-17 MED ORDER — ROSUVASTATIN CALCIUM 5 MG PO TABS: 5.0000 mg | ORAL_TABLET | Freq: Every day | ORAL | 1 refills | Status: DC

## 2020-07-17 MED ORDER — FUROSEMIDE 40 MG PO TABS: 40.0000 mg | ORAL_TABLET | Freq: Every day | ORAL | 3 refills | Status: DC

## 2020-07-17 MED ORDER — METOPROLOL TARTRATE 25 MG PO TABS: 12.5000 mg | ORAL_TABLET | Freq: Two times a day (BID) | ORAL | 1 refills | Status: DC

## 2020-07-17 MED ORDER — ALLOPURINOL 300 MG PO TABS: 300.0000 mg | ORAL_TABLET | Freq: Every day | ORAL | 1 refills | Status: DC

## 2020-07-17 MED ORDER — POTASSIUM 99 MG PO TABS: 1.0000 | ORAL_TABLET | Freq: Every day | ORAL | 1 refills | Status: DC

## 2020-07-17 MED ORDER — BENAZEPRIL HCL 20 MG PO TABS: 20.0000 mg | ORAL_TABLET | Freq: Every day | ORAL | 1 refills | Status: DC

## 2020-07-17 MED ORDER — TRAZODONE HCL 50 MG PO TABS: 25.0000 mg | ORAL_TABLET | Freq: Every evening | ORAL | 1 refills | Status: DC | PRN

## 2020-07-17 MED ORDER — CLONAZEPAM 0.5 MG PO TABS: 0.5000 mg | ORAL_TABLET | Freq: Two times a day (BID) | ORAL | 2 refills | Status: DC | PRN

## 2020-07-17 NOTE — Patient Instructions (Signed)

## 2020-07-17 NOTE — Addendum Note (Signed)
Addended by: Rolena Infante on: 07/17/2020 11:13 AM   Modules accepted: Orders

## 2020-07-17 NOTE — Progress Notes (Signed)
Subjective:    Patient ID: Joseph [name], male    DOB: October 24, 1941, 79 y.o.   MRN: [id number]   Chief Complaint: Medical Management of Chronic Issues    HPI:  1. Essential hypertension, benign No c/o chest pain, sob or headache. Does not check blood pressure at home BP Readings from Last 3 Encounters:  07/05/20 138/82  05/31/20 122/62  05/24/20 128/78      2. Hyperlipidemia with target LDL less than 100 Does not watch diet and does no dedicated exercise. Lab Results  Component Value Date   CHOL 140 04/09/2020   HDL 36 (L) 04/09/2020   LDLCALC 72 04/09/2020   TRIG 187 (H) 04/09/2020   CHOLHDL 3.9 04/09/2020   The 10-year ASCVD risk score Mikey Bussing DC Jr., et al., 2013) is: 35.4%   Values used to calculate the score:     Age: 19 years     Sex: Male     Is Non-Hispanic African American: No     Diabetic: No     Tobacco smoker: No     Systolic Blood Pressure: 409 mmHg     Is BP treated: Yes     HDL Cholesterol: 36 mg/dL     Total Cholesterol: 140 mg/dL   3. Hypokalemia No c/o of lower ext cramping  4. Thoracic ascending aortic aneurysm ([place]) Had CT angiogram on 05/24/20 which showed stable aneurysm without changes from previous.  5. Raynaud's disease without gangrene says he is ok. Hands and feet stay cold all the time.  6. Polyneuropathy Has burning in bil feet and pain in legs that is worse at nght. He is on ultram BID.  7. Benign prostatic hyperplasia without lower urinary tract symptoms No problems voiding Lab Results  Component Value Date   PSA1 3.3 06/29/2018   PSA1 3.0 02/20/2017   PSA 3.3 01/08/2015   PSA 3.4 10/05/2014   PSA 2.9 06/13/2014      8. Idiopathic chronic gout of multiple sites without tophus Denies any recent gout flare ups.  9. Primary insomnia Is on trazadone to sleep at night and is doing well.  10. Severe obesity (BMI >= 40) (HCC) No recent weight changes Wt Readings from Last 3 Encounters:  07/17/20 262 lb (118.8 kg)    07/05/20 265 lb 6.4 oz (120.4 kg)  05/31/20 264 lb (119.7 kg)   BMI Readings from Last 3 Encounters:  07/17/20 39.84 kg/m  07/05/20 40.35 kg/m  05/31/20 40.14 kg/m       Outpatient Encounter Medications as of 07/17/2020  Medication Sig  . allopurinol (ZYLOPRIM) 300 MG tablet Take 1 tablet (300 mg total) by mouth daily.  . benazepril (LOTENSIN) 20 MG tablet Take 1 tablet (20 mg total) by mouth daily.  . clonazePAM (KLONOPIN) 0.5 MG tablet Take 1 tablet (0.5 mg total) by mouth 2 (two) times daily as needed.  . fish oil-omega-3 fatty acids 1000 MG capsule Take 2 g by mouth daily.  . furosemide (LASIX) 40 MG tablet Take 1 tablet (40 mg total) by mouth daily.  . meclizine (ANTIVERT) 25 MG tablet TAKE 1/2 TO 1 (ONE-HALF TO ONE) TABLET BY MOUTH THREE TIMES DAILY AS NEEDED FOR DIZZINESS  . Melatonin 5 MG TABS Take 5 mg by mouth at bedtime.  . metoprolol tartrate (LOPRESSOR) 25 MG tablet Take 0.5 tablets (12.5 mg total) by mouth 2 (two) times daily.  . Multiple Vitamins-Minerals (COMPLETE MULTIVITAMIN/MINERAL PO) Take 1 tablet by mouth daily.  Marland Kitchen NIFEdipine (PROCARDIA-XL/NIFEDICAL-XL) 30 MG 24  hr tablet Take 1 tablet (30 mg total) by mouth daily.  . Potassium 99 MG TABS Take 1 tablet (99 mg total) by mouth daily.  . rosuvastatin (CRESTOR) 5 MG tablet Take 1 tablet (5 mg total) by mouth daily.  . traMADol (ULTRAM) 50 MG tablet Take 1 tablet (50 mg total) by mouth 2 (two) times daily.  . traZODone (DESYREL) 50 MG tablet Take 0.5-1 tablets (25-50 mg total) by mouth at bedtime as needed. for sleep    Past Surgical History:  Procedure Laterality Date  . CARDIAC CATHETERIZATION N/A 06/27/2015   Procedure: Left Heart Cath and Coronary Angiography;  Surgeon: Troy Sine, MD;  Location: [facility];  Service: Cardiovascular;  Laterality: N/A;  . HERNIA REPAIR    . KIDNEY STONE SURGERY      Family History  Problem Relation Age of Onset  . Heart disease Father   . Heart attack Father    . Diabetes Sister   . Heart disease Brother   . Deep vein thrombosis Brother   . Congestive Heart Failure Brother   . Cancer Brother   . Leukemia Brother   . Heart attack Brother     New complaints: None today  Social history: Lives by hisself. Has a grlfriend  Controlled substance contract: n/a    Review of Systems  Constitutional: Negative for diaphoresis.  Eyes: Negative for pain.  Respiratory: Negative for shortness of breath.   Cardiovascular: Negative for chest pain, palpitations and leg swelling.  Gastrointestinal: Negative for abdominal pain.  Endocrine: Negative for polydipsia.  Skin: Negative for rash.  Neurological: Negative for dizziness, weakness and headaches.  Hematological: Does not bruise/bleed easily.  All other systems reviewed and are negative.      Objective:   Physical Exam Vitals and nursing note reviewed.  Constitutional:      Appearance: Normal appearance. He is well-developed.  HENT:     Head: Normocephalic.     Nose: Nose normal.  Eyes:     Pupils: Pupils are equal, round, and reactive to light.  Neck:     Thyroid: No thyroid mass or thyromegaly.     Vascular: No carotid bruit or JVD.     Trachea: Phonation normal.  Cardiovascular:     Rate and Rhythm: Normal rate and regular rhythm.  Pulmonary:     Effort: Pulmonary effort is normal. No respiratory distress.     Breath sounds: Normal breath sounds.  Abdominal:     General: Bowel sounds are normal.     Palpations: Abdomen is soft.     Tenderness: There is no abdominal tenderness.  Musculoskeletal:        General: Normal range of motion.     Cervical back: Normal range of motion and neck supple.     Right lower leg: Edema (1+) present.     Left lower leg: Edema (1+) present.  Lymphadenopathy:     Cervical: No cervical adenopathy.  Skin:    General: Skin is warm and dry.  Neurological:     Mental Status: He is alert and oriented to person, place, and time.  Psychiatric:         Behavior: Behavior normal.        Thought Content: Thought content normal.        Judgment: Judgment normal.    BP 135/71   Pulse 67   Temp (!) 97.4 F (36.3 C) (Temporal)   Resp 20   Ht 5\' 8"  (1.727 m)  Wt 262 lb (118.8 kg)   SpO2 93%   BMI 39.84 kg/m         Assessment & Plan:  JYAIRE KOUDELKA comes in today with chief complaint of Medical Management of Chronic Issues   Diagnosis and orders addressed:  1. Essential hypertension, benign Low sodium diet - benazepril (LOTENSIN) 20 MG tablet; Take 1 tablet (20 mg total) by mouth daily.  Dispense: 90 tablet; Refill: 1 - metoprolol tartrate (LOPRESSOR) 25 MG tablet; Take 0.5 tablets (12.5 mg total) by mouth 2 (two) times daily.  Dispense: 90 tablet; Refill: 1 - furosemide (LASIX) 40 MG tablet; Take 1 tablet (40 mg total) by mouth daily.  Dispense: 90 tablet; Refill: 3  2. Hyperlipidemia with target LDL less than 100 Low fat diet - rosuvastatin (CRESTOR) 5 MG tablet; Take 1 tablet (5 mg total) by mouth daily.  Dispense: 90 tablet; Refill: 1  3. Hypokalemia Continue potasssium supplement - Potassium 99 MG TABS; Take 1 tablet (99 mg total) by mouth daily.  Dispense: 90 tablet; Refill: 1  4. Thoracic ascending aortic aneurysm ([place]) Will repeat CT scan in 1 year - NIFEdipine (PROCARDIA-XL/NIFEDICAL-XL) 30 MG 24 hr tablet; Take 1 tablet (30 mg total) by mouth daily.  Dispense: 90 tablet; Refill: 1  5. Raynaud's disease without gangrene Keep finger and fet warm  6. Polyneuropathy - traMADol (ULTRAM) 50 MG tablet; Take 1 tablet (50 mg total) by mouth 2 (two) times daily.  Dispense: 60 tablet; Refill: 2  7. Benign prostatic hyperplasia without lower urinary tract symptoms Keep follo wup with urology  8. Idiopathic chronic gout of multiple sites without tophus Continue low purine diet - allopurinol (ZYLOPRIM) 300 MG tablet; Take 1 tablet (300 mg total) by mouth daily.  Dispense: 90 tablet; Refill: 1  9. Primary  insomnia Bedtime routine - traZODone (DESYREL) 50 MG tablet; Take 0.5-1 tablets (25-50 mg total) by mouth at bedtime as needed. for sleep  Dispense: 90 tablet; Refill: 1  10. Severe obesity (BMI >= 40) (HCC) Discussed diet and exercise for person with BMI >25 Will recheck weight in 3-6 months   11. GAD (generalized anxiety disorder) Stress management - clonazePAM (KLONOPIN) 0.5 MG tablet; Take 1 tablet (0.5 mg total) by mouth 2 (two) times daily as needed.  Dispense: 60 tablet; Refill: 2   Labs pending Health Maintenance reviewed Diet and exercise encouraged  Follow up plan: 3 months   Mary-Margaret Hassell Done, FNP

## 2020-07-18 LAB — CBC WITH DIFFERENTIAL/PLATELET
Basophils Absolute: 0.1 10*3/uL (ref 0.0–0.2)
Basos: 1 %
EOS (ABSOLUTE): 0.4 10*3/uL (ref 0.0–0.4)
Eos: 4 %
Hematocrit: 44.6 % (ref 37.5–51.0)
Hemoglobin: 14.5 g/dL (ref 13.0–17.7)
Immature Grans (Abs): 0 10*3/uL (ref 0.0–0.1)
Immature Granulocytes: 0 %
Lymphocytes Absolute: 1.6 10*3/uL (ref 0.7–3.1)
Lymphs: 18 %
MCH: 32.8 pg (ref 26.6–33.0)
MCHC: 32.5 g/dL (ref 31.5–35.7)
MCV: 101 fL — ABNORMAL HIGH (ref 79–97)
Monocytes Absolute: 0.9 10*3/uL (ref 0.1–0.9)
Monocytes: 10 %
Neutrophils Absolute: 6.2 10*3/uL (ref 1.4–7.0)
Neutrophils: 67 %
Platelets: 208 10*3/uL (ref 150–450)
RBC: 4.42 x10E6/uL (ref 4.14–5.80)
RDW: 13 % (ref 11.6–15.4)
WBC: 9.1 10*3/uL (ref 3.4–10.8)

## 2020-07-18 LAB — LIPID PANEL
Chol/HDL Ratio: 4.3 ratio (ref 0.0–5.0)
Cholesterol, Total: 139 mg/dL (ref 100–199)
HDL: 32 mg/dL — ABNORMAL LOW (ref >39)
LDL Chol Calc (NIH): 79 mg/dL (ref 0–99)
Triglycerides: 164 mg/dL — ABNORMAL HIGH (ref 0–149)
VLDL Cholesterol Cal: 28 mg/dL (ref 5–40)

## 2020-07-20 ENCOUNTER — Telehealth: Payer: Self-pay | Admitting: *Deleted

## 2020-07-20 ENCOUNTER — Encounter: Payer: Self-pay | Admitting: Family Medicine

## 2020-07-20 NOTE — Telephone Encounter (Signed)
-----   Message from Verta Ellen., NP sent at 07/20/2020  9:16 AM EDT ----- Please let Mr. Ess know all of his blood work looked very good

## 2020-07-23 NOTE — Telephone Encounter (Signed)
Pt aware.

## 2020-08-23 ENCOUNTER — Telehealth: Payer: Self-pay | Admitting: Nurse Practitioner

## 2020-10-13 ENCOUNTER — Other Ambulatory Visit: Payer: Self-pay | Admitting: Nurse Practitioner

## 2020-10-13 DIAGNOSIS — G629 Polyneuropathy, unspecified: Secondary | ICD-10-CM

## 2020-10-17 ENCOUNTER — Other Ambulatory Visit: Payer: Self-pay | Admitting: Nurse Practitioner

## 2020-10-17 DIAGNOSIS — G629 Polyneuropathy, unspecified: Secondary | ICD-10-CM

## 2020-10-18 ENCOUNTER — Ambulatory Visit (INDEPENDENT_AMBULATORY_CARE_PROVIDER_SITE_OTHER): Payer: Medicare HMO | Admitting: Nurse Practitioner

## 2020-10-18 ENCOUNTER — Other Ambulatory Visit: Payer: Self-pay

## 2020-10-18 ENCOUNTER — Encounter: Payer: Self-pay | Admitting: Nurse Practitioner

## 2020-10-18 VITALS — BP 125/73 | HR 66 | Temp 96.8°F | Resp 20 | Ht 68.0 in | Wt 261.0 lb

## 2020-10-18 DIAGNOSIS — G629 Polyneuropathy, unspecified: Secondary | ICD-10-CM

## 2020-10-18 DIAGNOSIS — F5101 Primary insomnia: Secondary | ICD-10-CM

## 2020-10-18 DIAGNOSIS — F411 Generalized anxiety disorder: Secondary | ICD-10-CM | POA: Diagnosis not present

## 2020-10-18 DIAGNOSIS — I10 Essential (primary) hypertension: Secondary | ICD-10-CM | POA: Diagnosis not present

## 2020-10-18 DIAGNOSIS — E876 Hypokalemia: Secondary | ICD-10-CM | POA: Diagnosis not present

## 2020-10-18 DIAGNOSIS — E785 Hyperlipidemia, unspecified: Secondary | ICD-10-CM | POA: Diagnosis not present

## 2020-10-18 DIAGNOSIS — I712 Thoracic aortic aneurysm, without rupture: Secondary | ICD-10-CM | POA: Diagnosis not present

## 2020-10-18 DIAGNOSIS — I7121 Aneurysm of the ascending aorta, without rupture: Secondary | ICD-10-CM

## 2020-10-18 LAB — LIPID PANEL

## 2020-10-18 MED ORDER — FUROSEMIDE 40 MG PO TABS: 40.0000 mg | ORAL_TABLET | Freq: Every day | ORAL | 3 refills | Status: DC

## 2020-10-18 MED ORDER — POTASSIUM 99 MG PO TABS: 1.0000 | ORAL_TABLET | Freq: Every day | ORAL | 1 refills | Status: DC

## 2020-10-18 MED ORDER — BENAZEPRIL HCL 20 MG PO TABS: 20.0000 mg | ORAL_TABLET | Freq: Every day | ORAL | 1 refills | Status: DC

## 2020-10-18 MED ORDER — METOPROLOL TARTRATE 25 MG PO TABS: 12.5000 mg | ORAL_TABLET | Freq: Two times a day (BID) | ORAL | 1 refills | Status: DC

## 2020-10-18 MED ORDER — CLONAZEPAM 0.5 MG PO TABS: 0.5000 mg | ORAL_TABLET | Freq: Two times a day (BID) | ORAL | 2 refills | Status: DC | PRN

## 2020-10-18 MED ORDER — ROSUVASTATIN CALCIUM 5 MG PO TABS: 5.0000 mg | ORAL_TABLET | Freq: Every day | ORAL | 1 refills | Status: DC

## 2020-10-18 MED ORDER — NIFEDIPINE ER OSMOTIC RELEASE 30 MG PO TB24: 30.0000 mg | ORAL_TABLET | Freq: Every day | ORAL | 1 refills | Status: DC

## 2020-10-18 MED ORDER — TRAZODONE HCL 50 MG PO TABS: 25.0000 mg | ORAL_TABLET | Freq: Every evening | ORAL | 1 refills | Status: DC | PRN

## 2020-10-18 MED ORDER — TRAMADOL HCL 50 MG PO TABS: 50.0000 mg | ORAL_TABLET | Freq: Two times a day (BID) | ORAL | 2 refills | Status: DC

## 2020-10-18 NOTE — Progress Notes (Signed)
Subjective:    Patient ID: Joseph [name], male    DOB: March 26, 1941, 79 y.o.   MRN: [id number]   Chief Complaint: medical management of chronic issues     HPI:  1. Essential hypertension, benign BP Readings from Last 3 Encounters:  10/18/20 125/73  07/17/20 135/71  07/05/20 138/82  Checks BP at home ~120s/70s, take medication as prescribed. Watches diet and avoids high salt foods. Denies chest pain, SOB, or weakness.   2. Polyneuropathy Takes medication as prescribed. States numb sensation in fingers and feet. Follows specialist.   3. Hyperlipidemia with target LDL less than 100 Lab Results  Component Value Date   CHOL 139 07/17/2020   HDL 32 (L) 07/17/2020   LDLCALC 79 07/17/2020   TRIG 164 (H) 07/17/2020   CHOLHDL 4.3 07/17/2020  Takes medication as prescribed. Cooks at home. Avoids fried and fatty foods.   4. Primary insomnia Takes medication as prescribed. 7-8 hours nightly.   5. Severe obesity (BMI >= 40) (HCC) Wt Readings from Last 3 Encounters:  10/18/20 261 lb (118.4 kg)  07/17/20 262 lb (118.8 kg)  07/05/20 265 lb 6.4 oz (120.4 kg)   BMI Readings from Last 3 Encounters:  10/18/20 39.68 kg/m  07/17/20 39.84 kg/m  07/05/20 40.35 kg/m   No formal exercise. Walks regularly. Does at home exercises with stationary bike and bands.     Outpatient Encounter Medications as of 10/18/2020  Medication Sig   allopurinol (ZYLOPRIM) 300 MG tablet Take 1 tablet (300 mg total) by mouth daily.   benazepril (LOTENSIN) 20 MG tablet Take 1 tablet (20 mg total) by mouth daily.   clonazePAM (KLONOPIN) 0.5 MG tablet Take 1 tablet (0.5 mg total) by mouth 2 (two) times daily as needed.   fish oil-omega-3 fatty acids 1000 MG capsule Take 2 g by mouth daily.   furosemide (LASIX) 40 MG tablet Take 1 tablet (40 mg total) by mouth daily.   meclizine (ANTIVERT) 25 MG tablet TAKE 1/2 TO 1 (ONE-HALF TO ONE) TABLET BY MOUTH THREE TIMES DAILY AS NEEDED FOR DIZZINESS   Melatonin 5  MG TABS Take 5 mg by mouth at bedtime.   metoprolol tartrate (LOPRESSOR) 25 MG tablet Take 0.5 tablets (12.5 mg total) by mouth 2 (two) times daily.   Multiple Vitamins-Minerals (COMPLETE MULTIVITAMIN/MINERAL PO) Take 1 tablet by mouth daily.   NIFEdipine (PROCARDIA-XL/NIFEDICAL-XL) 30 MG 24 hr tablet Take 1 tablet (30 mg total) by mouth daily.   Potassium 99 MG TABS Take 1 tablet (99 mg total) by mouth daily.   rosuvastatin (CRESTOR) 5 MG tablet Take 1 tablet (5 mg total) by mouth daily.   traMADol (ULTRAM) 50 MG tablet Take 1 tablet (50 mg total) by mouth 2 (two) times daily.   traZODone (DESYREL) 50 MG tablet Take 0.5-1 tablets (25-50 mg total) by mouth at bedtime as needed. for sleep   No facility-administered encounter medications on file as of 10/18/2020.    Past Surgical History:  Procedure Laterality Date   CARDIAC CATHETERIZATION N/A 06/27/2015   Procedure: Left Heart Cath and Coronary Angiography;  Surgeon: Troy Sine, MD;  Location: [facility];  Service: Cardiovascular;  Laterality: N/A;   HERNIA REPAIR     KIDNEY STONE SURGERY      Family History  Problem Relation Age of Onset   Heart disease Father    Heart attack Father    Diabetes Sister    Heart disease Brother    Deep vein thrombosis Brother  Congestive Heart Failure Brother    Cancer Brother    Leukemia Brother    Heart attack Brother     New complaints: No new complaints.  Social history: Has girlfriend stay with him often. Goes to church regularly.   Controlled substance contract: signed 01/06/2020     Review of Systems  Constitutional: Negative.   HENT: Negative.   Eyes: Negative.   Respiratory: Negative.   Cardiovascular: Negative.   Gastrointestinal: Negative.   Endocrine: Negative.   Genitourinary: Negative.   Musculoskeletal: Negative.   Skin: Negative.   Allergic/Immunologic: Negative.   Neurological: Negative.   Hematological: Negative.     Psychiatric/Behavioral: Negative.   All other systems reviewed and are negative.      Objective:   Physical Exam Vitals and nursing note reviewed.  Constitutional:      Appearance: Normal appearance.  HENT:     Head: Normocephalic and atraumatic.     Right Ear: Tympanic membrane, ear canal and external ear normal.     Left Ear: Tympanic membrane, ear canal and external ear normal.     Mouth/Throat:     Mouth: Mucous membranes are moist.     Pharynx: Oropharynx is clear.  Eyes:     Extraocular Movements: Extraocular movements intact.     Conjunctiva/sclera: Conjunctivae normal.     Pupils: Pupils are equal, round, and reactive to light.  Cardiovascular:     Rate and Rhythm: Normal rate and regular rhythm.     Heart sounds: Normal heart sounds.  Abdominal:     General: Bowel sounds are normal.     Palpations: Abdomen is soft.  Musculoskeletal:        General: Normal range of motion.     Cervical back: Normal range of motion.  Feet:     Right foot:     Skin integrity: Skin integrity normal.     Toenail Condition: Right toenails are abnormally thick.     Left foot:     Skin integrity: Skin integrity normal.     Toenail Condition: Left toenails are abnormally thick.  Skin:    General: Skin is warm and dry.     Capillary Refill: Capillary refill takes less than 2 seconds.  Neurological:     General: No focal deficit present.     Mental Status: He is alert and oriented to person, place, and time. Mental status is at baseline.  Psychiatric:        Mood and Affect: Mood normal.        Behavior: Behavior normal.        Thought Content: Thought content normal.        Judgment: Judgment normal.    BP 125/73    Pulse 66    Temp (!) 96.8 F (36 C) (Temporal)    Resp 20    Ht 5\' 8"  (1.727 m)    Wt 261 lb (118.4 kg)    SpO2 90%    BMI 39.68 kg/m       Assessment & Plan:  RAIDYN WASSINK comes in today with chief complaint of No chief complaint on file.   Diagnosis and  orders addressed:  1. Essential hypertension, benign Check blood pressure regularly and take medication as prescribed. Eat a heart healthy diet and avoid foods high in salt.   2. Polyneuropathy Check feet for wounds regularly. Report any new or worsening symptoms.   3. Hyperlipidemia with target LDL less than 100 Take medication as prescribed. Avoid foods  that are high in fat or fried.   4. Primary insomnia Take medication as prescribed. Report any new or worsening symptoms.   5. Severe obesity (BMI >= 40) (HCC) Exercise regularly. Walking and swimming are great low-impact exercises that help lower blood pressure, cholesterol levels, and help you maintain a healthy weight.   Meds ordered this encounter  Medications   furosemide (LASIX) 40 MG tablet    Sig: Take 1 tablet (40 mg total) by mouth daily.    Dispense:  90 tablet    Refill:  3    07/05/2020 dose increase    Order Specific Question:   Supervising Provider    Answer:   Caryl Pina A [[id number]]   benazepril (LOTENSIN) 20 MG tablet    Sig: Take 1 tablet (20 mg total) by mouth daily.    Dispense:  90 tablet    Refill:  1    Order Specific Question:   Supervising Provider    Answer:   Caryl Pina A [[id number]]   metoprolol tartrate (LOPRESSOR) 25 MG tablet    Sig: Take 0.5 tablets (12.5 mg total) by mouth 2 (two) times daily.    Dispense:  90 tablet    Refill:  1    *NEW MEDICATION 07/02/15    Order Specific Question:   Supervising Provider    Answer:   Caryl Pina A [[id number]]   clonazePAM (KLONOPIN) 0.5 MG tablet    Sig: Take 1 tablet (0.5 mg total) by mouth 2 (two) times daily as needed.    Dispense:  60 tablet    Refill:  2    Order Specific Question:   Supervising Provider    Answer:   Caryl Pina A [[id number]]   rosuvastatin (CRESTOR) 5 MG tablet    Sig: Take 1 tablet (5 mg total) by mouth daily.    Dispense:  90 tablet    Refill:  1    New 11/28/2019    Order Specific Question:    Supervising Provider    Answer:   Caryl Pina A [[id number]]   Potassium 99 MG TABS    Sig: Take 1 tablet (99 mg total) by mouth daily.    Dispense:  90 tablet    Refill:  1    Order Specific Question:   Supervising Provider    Answer:   Caryl Pina A [[id number]]   traMADol (ULTRAM) 50 MG tablet    Sig: Take 1 tablet (50 mg total) by mouth 2 (two) times daily.    Dispense:  60 tablet    Refill:  2    Order Specific Question:   Supervising Provider    Answer:   Caryl Pina A [[id number]]   traZODone (DESYREL) 50 MG tablet    Sig: Take 0.5-1 tablets (25-50 mg total) by mouth at bedtime as needed. for sleep    Dispense:  90 tablet    Refill:  1    Please consider 90 day supplies to promote better adherence    Order Specific Question:   Supervising Provider    Answer:   Caryl Pina A [[id number]]   NIFEdipine (PROCARDIA-XL/NIFEDICAL-XL) 30 MG 24 hr tablet    Sig: Take 1 tablet (30 mg total) by mouth daily.    Dispense:  90 tablet    Refill:  1    Order Specific Question:   Supervising Provider    Answer:   Caryl Pina A [id number]     Labs pending Health Maintenance reviewed Diet and exercise encouraged  Follow up plan: Follow up in 3 months.    Mary-Margaret Hassell Done, FNP

## 2020-10-18 NOTE — Patient Instructions (Signed)
Diabetes Mellitus and Foot Care Foot care is an important part of your health, especially when you have diabetes. Diabetes may cause you to have problems because of poor blood flow (circulation) to your feet and legs, which can cause your skin to:  Become thinner and drier.  Break more easily.  Heal more slowly.  Peel and crack. You may also have nerve damage (neuropathy) in your legs and feet, causing decreased feeling in them. This means that you may not notice minor injuries to your feet that could lead to more serious problems. Noticing and addressing any potential problems early is the best way to prevent future foot problems. How to care for your feet Foot hygiene  Wash your feet daily with warm water and mild soap. Do not use hot water. Then, pat your feet and the areas between your toes until they are completely dry. Do not soak your feet as this can dry your skin.  Trim your toenails straight across. Do not dig under them or around the cuticle. File the edges of your nails with an emery board or nail file.  Apply a moisturizing lotion or petroleum jelly to the skin on your feet and to dry, brittle toenails. Use lotion that does not contain alcohol and is unscented. Do not apply lotion between your toes. Shoes and socks  Wear clean socks or stockings every day. Make sure they are not too tight. Do not wear knee-high stockings since they may decrease blood flow to your legs.  Wear shoes that fit properly and have enough cushioning. Always look in your shoes before you put them on to be sure there are no objects inside.  To break in new shoes, wear them for just a few hours a day. This prevents injuries on your feet. Wounds, scrapes, corns, and calluses  Check your feet daily for blisters, cuts, bruises, sores, and redness. If you cannot see the bottom of your feet, use a mirror or ask someone for help.  Do not cut corns or calluses or try to remove them with medicine.  If you  find a minor scrape, cut, or break in the skin on your feet, keep it and the skin around it clean and dry. You may clean these areas with mild soap and water. Do not clean the area with peroxide, alcohol, or iodine.  If you have a wound, scrape, corn, or callus on your foot, look at it several times a day to make sure it is healing and not infected. Check for: ? Redness, swelling, or pain. ? Fluid or blood. ? Warmth. ? Pus or a bad smell. General instructions  Do not cross your legs. This may decrease blood flow to your feet.  Do not use heating pads or hot water bottles on your feet. They may burn your skin. If you have lost feeling in your feet or legs, you may not know this is happening until it is too late.  Protect your feet from hot and cold by wearing shoes, such as at the beach or on hot pavement.  Schedule a complete foot exam at least once a year (annually) or more often if you have foot problems. If you have foot problems, report any cuts, sores, or bruises to your health care provider immediately. Contact a health care provider if:  You have a medical condition that increases your risk of infection and you have any cuts, sores, or bruises on your feet.  You have an injury that is not   healing.  You have redness on your legs or feet.  You feel burning or tingling in your legs or feet.  You have pain or cramps in your legs and feet.  Your legs or feet are numb.  Your feet always feel cold.  You have pain around a toenail. Get help right away if:  You have a wound, scrape, corn, or callus on your foot and: ? You have pain, swelling, or redness that gets worse. ? You have fluid or blood coming from the wound, scrape, corn, or callus. ? Your wound, scrape, corn, or callus feels warm to the touch. ? You have pus or a bad smell coming from the wound, scrape, corn, or callus. ? You have a fever. ? You have a red line going up your leg. Summary  Check your feet every day  for cuts, sores, red spots, swelling, and blisters.  Moisturize feet and legs daily.  Wear shoes that fit properly and have enough cushioning.  If you have foot problems, report any cuts, sores, or bruises to your health care provider immediately.  Schedule a complete foot exam at least once a year (annually) or more often if you have foot problems. This information is not intended to replace advice given to you by your health care provider. Make sure you discuss any questions you have with your health care provider. Document Revised: 08/10/2019 Document Reviewed: 12/19/2016 Elsevier Patient Education  2020 [employer]  

## 2020-10-19 LAB — CBC WITH DIFFERENTIAL/PLATELET
Basophils Absolute: 0.1 10*3/uL (ref 0.0–0.2)
Basos: 1 %
EOS (ABSOLUTE): 0.2 10*3/uL (ref 0.0–0.4)
Eos: 3 %
Hematocrit: 43.2 % (ref 37.5–51.0)
Hemoglobin: 14.4 g/dL (ref 13.0–17.7)
Immature Grans (Abs): 0 10*3/uL (ref 0.0–0.1)
Immature Granulocytes: 0 %
Lymphocytes Absolute: 1.4 10*3/uL (ref 0.7–3.1)
Lymphs: 18 %
MCH: 32.7 pg (ref 26.6–33.0)
MCHC: 33.3 g/dL (ref 31.5–35.7)
MCV: 98 fL — ABNORMAL HIGH (ref 79–97)
Monocytes Absolute: 0.7 10*3/uL (ref 0.1–0.9)
Monocytes: 10 %
Neutrophils Absolute: 5 10*3/uL (ref 1.4–7.0)
Neutrophils: 68 %
Platelets: 229 10*3/uL (ref 150–450)
RBC: 4.41 x10E6/uL (ref 4.14–5.80)
RDW: 13.1 % (ref 11.6–15.4)
WBC: 7.4 10*3/uL (ref 3.4–10.8)

## 2020-10-19 LAB — CMP14+EGFR
ALT: 23 IU/L (ref 0–44)
AST: 23 IU/L (ref 0–40)
Albumin/Globulin Ratio: 1.6 (ref 1.2–2.2)
Albumin: 4.4 g/dL (ref 3.7–4.7)
Alkaline Phosphatase: 71 IU/L (ref 44–121)
BUN/Creatinine Ratio: 14 (ref 10–24)
BUN: 14 mg/dL (ref 8–27)
Bilirubin Total: 0.4 mg/dL (ref 0.0–1.2)
CO2: 21 mmol/L (ref 20–29)
Calcium: 9.6 mg/dL (ref 8.6–10.2)
Chloride: 103 mmol/L (ref 96–106)
Creatinine, Ser: 1.01 mg/dL (ref 0.76–1.27)
GFR calc Af Amer: 83 mL/min/{1.73_m2} (ref >59)
GFR calc non Af Amer: 71 mL/min/{1.73_m2} (ref >59)
Globulin, Total: 2.7 g/dL (ref 1.5–4.5)
Glucose: 111 mg/dL — ABNORMAL HIGH (ref 65–99)
Potassium: 4.5 mmol/L (ref 3.5–5.2)
Sodium: 140 mmol/L (ref 134–144)
Total Protein: 7.1 g/dL (ref 6.0–8.5)

## 2020-10-19 LAB — LIPID PANEL
Chol/HDL Ratio: 4.1 ratio (ref 0.0–5.0)
Cholesterol, Total: 152 mg/dL (ref 100–199)
HDL: 37 mg/dL — ABNORMAL LOW (ref >39)
LDL Chol Calc (NIH): 93 mg/dL (ref 0–99)
Triglycerides: 121 mg/dL (ref 0–149)
VLDL Cholesterol Cal: 22 mg/dL (ref 5–40)

## 2020-10-19 LAB — TSH: TSH: 1.76 u[IU]/mL (ref 0.450–4.500)

## 2020-11-12 ENCOUNTER — Other Ambulatory Visit: Payer: Self-pay | Admitting: Nurse Practitioner

## 2020-11-12 DIAGNOSIS — E785 Hyperlipidemia, unspecified: Secondary | ICD-10-CM

## 2020-12-04 ENCOUNTER — Other Ambulatory Visit: Payer: Self-pay | Admitting: Nurse Practitioner

## 2020-12-04 DIAGNOSIS — M1A09X0 Idiopathic chronic gout, multiple sites, without tophus (tophi): Secondary | ICD-10-CM

## 2021-01-09 NOTE — Progress Notes (Signed)
Cardiology Office Note  Date: 01/10/2021   ID: [name], [name] October 24, 1941, MRN [id number]  PCP:  Chevis Pretty, FNP  Cardiologist:  Rozann Lesches, MD Electrophysiologist:  None   Chief Complaint  Patient presents with   Cardiac follow-up    History of Present Illness: Joseph [name] is a 80 y.o. male last seen in August 2021 by Mr. Leonides Sake NP.  He is here today with his wife for a follow-up visit. At the last visit Lasix was increased to 40 mg daily.  He states that this has been more effective in controlling his leg swelling.  Weight has not changed significantly, actually he usually only takes a half of a pill and less his legs are bothering him.  I reviewed his medications which are listed below.  He has had some concerns about side effects on Crestor, pain in his legs.  He would like to cut the dose back and see if it helps.  We also discussed switching from Lopressor to Toprol-XL.  Chest CTA from June 2021 is noted below.  He remains asymptomatic.  Past Medical History:  Diagnosis Date   Anxiety    Ascending aortic aneurysm ([place])    Essential hypertension    GERD (gastroesophageal reflux disease)    Gout    History of kidney stones    Insomnia     Past Surgical History:  Procedure Laterality Date   CARDIAC CATHETERIZATION N/A 06/27/2015   Procedure: Left Heart Cath and Coronary Angiography;  Surgeon: Troy Sine, MD;  Location: [facility];  Service: Cardiovascular;  Laterality: N/A;   HERNIA REPAIR     KIDNEY STONE SURGERY      Current Outpatient Medications  Medication Sig Dispense Refill   allopurinol (ZYLOPRIM) 300 MG tablet Take 1 tablet by mouth once daily 90 tablet 0   benazepril (LOTENSIN) 20 MG tablet Take 1 tablet (20 mg total) by mouth daily. 90 tablet 1   clonazePAM (KLONOPIN) 0.5 MG tablet Take 1 tablet (0.5 mg total) by mouth 2 (two) times daily as needed. 60 tablet 2   fish oil-omega-3 fatty acids 1000 MG capsule  Take 2 g by mouth daily.     furosemide (LASIX) 40 MG tablet Take 1 tablet (40 mg total) by mouth daily. 90 tablet 3   meclizine (ANTIVERT) 25 MG tablet TAKE 1/2 TO 1 (ONE-HALF TO ONE) TABLET BY MOUTH THREE TIMES DAILY AS NEEDED FOR DIZZINESS 30 tablet 0   Melatonin 5 MG TABS Take 5 mg by mouth at bedtime.     metoprolol succinate (TOPROL XL) 25 MG 24 hr tablet Take 1 tablet (25 mg total) by mouth daily. 90 tablet 1   Multiple Vitamins-Minerals (COMPLETE MULTIVITAMIN/MINERAL PO) Take 1 tablet by mouth daily.     NIFEdipine (PROCARDIA-XL/NIFEDICAL-XL) 30 MG 24 hr tablet Take 1 tablet (30 mg total) by mouth daily. 90 tablet 1   Potassium 99 MG TABS Take 1 tablet (99 mg total) by mouth daily. 90 tablet 1   traMADol (ULTRAM) 50 MG tablet Take 1 tablet (50 mg total) by mouth 2 (two) times daily. 60 tablet 2   traZODone (DESYREL) 50 MG tablet Take 0.5-1 tablets (25-50 mg total) by mouth at bedtime as needed. for sleep 90 tablet 1   rosuvastatin (CRESTOR) 5 MG tablet Take 1 tablet (5 mg total) by mouth every other day. 45 tablet 1   No current facility-administered medications for this visit.   Allergies:  Carafate [sucralfate], Ciprofloxacin  hcl, Prevacid [lansoprazole], Quinolones, and Sulfa antibiotics   ROS: No palpitations or syncope.  Physical Exam: VS:  BP (!) 122/58    Pulse 68    Ht 5\' 8"  (1.727 m)    Wt 261 lb (118.4 kg)    SpO2 94%    BMI 39.68 kg/m , BMI Body mass index is 39.68 kg/m.  Wt Readings from Last 3 Encounters:  01/10/21 261 lb (118.4 kg)  10/18/20 261 lb (118.4 kg)  07/17/20 262 lb (118.8 kg)    General: Patient appears comfortable at rest. HEENT: Conjunctiva and lids normal, wearing a mask. Neck: Supple, no elevated JVP or carotid bruits, no thyromegaly. Lungs: Clear to auscultation, nonlabored breathing at rest. Cardiac: Regular rate and rhythm, no S3, soft systolic murmur. Extremities: 1-2+ lower leg edema.  ECG:  An ECG dated 05/31/2020 was personally  reviewed today and demonstrated:  Sinus rhythm with leftward axis.  Recent Labwork: 10/18/2020: ALT 23; AST 23; BUN 14; Creatinine, Ser 1.01; Hemoglobin 14.4; Platelets 229; Potassium 4.5; Sodium 140; TSH 1.760     Component Value Date/Time   CHOL 152 10/18/2020 1052   CHOL 208 (H) 03/22/2013 0928   TRIG 121 10/18/2020 1052   TRIG 142 04/18/2015 0942   TRIG 163 (H) 03/22/2013 0928   HDL 37 (L) 10/18/2020 1052   HDL 43 04/18/2015 0942   HDL 39 (L) 03/22/2013 0928   CHOLHDL 4.1 10/18/2020 1052   LDLCALC 93 10/18/2020 1052   LDLCALC 65 07/10/2014 0936   LDLCALC 136 (H) 03/22/2013 0928    Other Studies Reviewed Today:  Chest CTA 05/24/2020: IMPRESSION: 1. Stable 4.8 cm ascending aortic aneurysm without complicating features. Recommend semi-annual imaging followup by CTA or MRA and referral to cardiothoracic surgery if not already obtained. This recommendation follows 2010 ACCF/AHA/AATS/ACR/ASA/SCA/SCAI/SIR/STS/SVM Guidelines for the Diagnosis and Management of Patients With Thoracic Aortic Disease. Circulation. 2010; 121: K093-G182. Aortic aneurysm NOS (ICD10-I71.9) 2. Coronary calcifications. The severity of coronary artery disease and any potential stenosis cannot be assessed on this non-gated CT examination. 3. Dilated right and left pulmonary arteries suggesting pulmonary arterial hypertension. 4. 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  Echocardiogram 05/29/2020: 1. Left ventricular ejection fraction, by estimation, is 55 to 60%. The  left ventricle has normal function. The left ventricle has no regional  wall motion abnormalities. Left ventricular diastolic parameters are  consistent with Grade I diastolic  dysfunction (impaired relaxation).  2. Right ventricular systolic function is normal. The right ventricular  size is normal. There is normal pulmonary artery systolic pressure.   3. Left atrial size was severely dilated.  4. Right atrial size was mildly dilated.  5. The mitral valve is normal in structure. Mild mitral valve  regurgitation. No evidence of mitral stenosis.  6. The aortic valve is tricuspid. Aortic valve regurgitation is mild. No  aortic stenosis is present.  7. Aortic dilatation noted. 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  8. The inferior vena cava is normal in size with greater than 50%  respiratory variability, suggesting right atrial pressure of 3 mmHg.   Comparison(s): Echocardiogram done 06/05/15 showed an EF of 60%.  Assessment and Plan:  1.  Intermittent leg swelling in the setting of diastolic dysfunction, normal LVEF at 55 to 60% as well as normal RV contraction.  He reports improvement with use of Lasix, alternating between 20 mg and 40 mg depending on degree  of edema.  Follow-up lab work in November 2021 showed normal creatinine and potassium.  2.  Ascending thoracic aortic aneurysm, stable and measuring 4.8 cm by chest CTA in June 2021.  He has followed with [facility] over time.  Due for follow-up later this year.  He is asymptomatic.  3.  Nonobstructive CAD by cardiac catheterization in 2016.  He is on low-dose Crestor, changing to every other day dosing given potential reported side effects.  4.  Essential hypertension, blood pressure is well controlled today.  We will switch from Lopressor to Toprol-XL 25 mg daily, otherwise continue Procardia XL and Lotensin.  Medication Adjustments/Labs and Tests Ordered: Current medicines are reviewed at length with the patient today.  Concerns regarding medicines are outlined above.   Tests Ordered: No orders of the defined types were placed in this encounter.   Medication Changes: Meds ordered this encounter  Medications   metoprolol succinate (TOPROL XL) 25 MG 24 hr tablet    Sig: Take 1 tablet (25 mg total) by mouth daily.    Dispense:  90 tablet    Refill:  1     NEW MEDICATION - STOP LOPRESSOR 01/10/2021   rosuvastatin (CRESTOR) 5 MG tablet    Sig: Take 1 tablet (5 mg total) by mouth every other day.    Dispense:  45 tablet    Refill:  1    DOSE CHANGE 01/10/2021    Disposition:  Follow up 6 months in the [place] office.  Signed, Satira Sark, MD, [facility] 01/10/2021 10:03 AM    [facility], [place], [place] [address] Phone: [phone]; Fax: [phone]

## 2021-01-10 ENCOUNTER — Ambulatory Visit: Payer: Medicare HMO | Admitting: Cardiology

## 2021-01-10 ENCOUNTER — Encounter: Payer: Self-pay | Admitting: Cardiology

## 2021-01-10 VITALS — BP 122/58 | HR 68 | Ht 68.0 in | Wt 261.0 lb

## 2021-01-10 DIAGNOSIS — I2510 Atherosclerotic heart disease of native coronary artery without angina pectoris: Secondary | ICD-10-CM | POA: Diagnosis not present

## 2021-01-10 DIAGNOSIS — I712 Thoracic aortic aneurysm, without rupture: Secondary | ICD-10-CM

## 2021-01-10 DIAGNOSIS — I10 Essential (primary) hypertension: Secondary | ICD-10-CM

## 2021-01-10 DIAGNOSIS — I7121 Aneurysm of the ascending aorta, without rupture: Secondary | ICD-10-CM

## 2021-01-10 DIAGNOSIS — E785 Hyperlipidemia, unspecified: Secondary | ICD-10-CM

## 2021-01-10 DIAGNOSIS — I5189 Other ill-defined heart diseases: Secondary | ICD-10-CM

## 2021-01-10 MED ORDER — METOPROLOL SUCCINATE ER 25 MG PO TB24: 25.0000 mg | ORAL_TABLET | Freq: Every day | ORAL | 1 refills | Status: DC

## 2021-01-10 MED ORDER — ROSUVASTATIN CALCIUM 5 MG PO TABS: 5.0000 mg | ORAL_TABLET | ORAL | 1 refills | Status: DC

## 2021-01-10 NOTE — Patient Instructions (Signed)
Your physician wants you to follow-up in: [place] will receive a reminder letter in the mail two months in advance. If you don't receive a letter, please call our office to schedule the follow-up appointment.  Your physician has recommended you make the following change in your medication:   STOP LOPRESSOR   START TOPROL XL 25 MG DAILY   CHANGE CRESTOR EVERY OTHER DAY   Thank you for choosing [facility]!!

## 2021-01-15 ENCOUNTER — Other Ambulatory Visit: Payer: Self-pay | Admitting: Nurse Practitioner

## 2021-01-15 DIAGNOSIS — G629 Polyneuropathy, unspecified: Secondary | ICD-10-CM

## 2021-01-18 ENCOUNTER — Ambulatory Visit (INDEPENDENT_AMBULATORY_CARE_PROVIDER_SITE_OTHER): Payer: Medicare HMO | Admitting: Nurse Practitioner

## 2021-01-18 ENCOUNTER — Encounter: Payer: Self-pay | Admitting: Nurse Practitioner

## 2021-01-18 ENCOUNTER — Other Ambulatory Visit: Payer: Self-pay

## 2021-01-18 VITALS — BP 128/69 | HR 66 | Temp 98.0°F | Resp 20 | Ht 68.0 in | Wt 262.0 lb

## 2021-01-18 DIAGNOSIS — E785 Hyperlipidemia, unspecified: Secondary | ICD-10-CM

## 2021-01-18 DIAGNOSIS — I7300 Raynaud's syndrome without gangrene: Secondary | ICD-10-CM

## 2021-01-18 DIAGNOSIS — F5101 Primary insomnia: Secondary | ICD-10-CM | POA: Diagnosis not present

## 2021-01-18 DIAGNOSIS — N400 Enlarged prostate without lower urinary tract symptoms: Secondary | ICD-10-CM

## 2021-01-18 DIAGNOSIS — M1A09X0 Idiopathic chronic gout, multiple sites, without tophus (tophi): Secondary | ICD-10-CM | POA: Diagnosis not present

## 2021-01-18 DIAGNOSIS — I8393 Asymptomatic varicose veins of bilateral lower extremities: Secondary | ICD-10-CM

## 2021-01-18 DIAGNOSIS — E876 Hypokalemia: Secondary | ICD-10-CM

## 2021-01-18 DIAGNOSIS — I712 Thoracic aortic aneurysm, without rupture: Secondary | ICD-10-CM

## 2021-01-18 DIAGNOSIS — G629 Polyneuropathy, unspecified: Secondary | ICD-10-CM

## 2021-01-18 DIAGNOSIS — I7121 Aneurysm of the ascending aorta, without rupture: Secondary | ICD-10-CM

## 2021-01-18 DIAGNOSIS — I10 Essential (primary) hypertension: Secondary | ICD-10-CM | POA: Diagnosis not present

## 2021-01-18 MED ORDER — TRAMADOL HCL 50 MG PO TABS
50.0000 mg | ORAL_TABLET | Freq: Two times a day (BID) | ORAL | 2 refills | Status: DC
Start: 2021-01-18 — End: 2021-04-17

## 2021-01-18 NOTE — Progress Notes (Signed)
Subjective:    Patient ID: Joseph [name], male    DOB: May 05, 1941, 80 y.o.   MRN: [id number]   Chief Complaint: medical management of chronic issues     HPI:  1. Primary hypertension, benign No c/o chets pain, sob or headaches. Does not check blood pressure at home. BP Readings from Last 3 Encounters:  01/18/21 128/69  01/10/21 (!) 122/58  10/18/20 125/73     2. Raynaud's disease without gangrene Gets cold if he is outside when it is cold. Has some numbness on occasion.  3. Thoracic ascending aortic aneurysm ([place]) Had angiogram  6/24/21and  At that time the aneurysm was stable at 4.8cm. no change from previous scan. He will check with Dr. Servando Snare.  4. Asymptomatic varicose veins of both lower extremities Legs feel heavy at times. No pain  5. Hyperlipidemia with target LDL less than 100 Doe snot watch diet and does no exercise. Takes crestor QOD Lab Results  Component Value Date   CHOL 152 10/18/2020   HDL 37 (L) 10/18/2020   LDLCALC 93 10/18/2020   TRIG 121 10/18/2020   CHOLHDL 4.1 10/18/2020   The 10-year ASCVD risk score Mikey Bussing DC Jr., et al., 2013) is: 34%   Values used to calculate the score:     Age: 39 years     Sex: Male     Is Non-Hispanic African American: No     Diabetic: No     Tobacco smoker: No     Systolic Blood Pressure: 354 mmHg     Is BP treated: Yes     HDL Cholesterol: 37 mg/dL     Total Cholesterol: 152 mg/dL   6. Hypokalemia No c/o lower ext cramping  7. Idiopathic chronic gout of multiple sites without tophus No recent gout flare ups  8. Benign prostatic hyperplasia without lower urinary tract symptoms No problems voiding Lab Results  Component Value Date   PSA1 3.3 06/29/2018   PSA1 3.0 02/20/2017   PSA 3.3 01/08/2015   PSA 3.4 10/05/2014   PSA 2.9 06/13/2014      9. Primary insomnia He is on trazadone  To sleep at night. Some nights he sleeps good and some nights he doesn't.   10. GAD He is on klonopin BID. He gets  anxious if he does not take. Gets nervous and upset easily.  GAD 7 : Generalized Anxiety Score 01/18/2021 07/17/2020 04/09/2020 01/06/2020  Nervous, Anxious, on Edge 0 0 0 0  Control/stop worrying 0 0 0 0  Worry too much - different things 0 0 0 0  Trouble relaxing 0 0 0 0  Restless 0 0 0 0  Easily annoyed or irritable 0 0 0 1  Afraid - awful might happen 0 0 0 0  Total GAD 7 Score 0 0 0 1  Anxiety Difficulty Not difficult at all Not difficult at all Not difficult at all Not difficult at all     11. Polyneuropathy Takes tramadol 1-2 times a day. He says his pain seems to be worse at night.  12. Severe obesity (BMI >= 40) (HCC) No recent weight changes  Wt Readings from Last 3 Encounters:  01/18/21 262 lb (118.8 kg)  01/10/21 261 lb (118.4 kg)  10/18/20 261 lb (118.4 kg)   BMI Readings from Last 3 Encounters:  01/18/21 39.84 kg/m  01/10/21 39.68 kg/m  10/18/20 39.68 kg/m        Outpatient Encounter Medications as of 01/18/2021  Medication Sig  . allopurinol (  ZYLOPRIM) 300 MG tablet Take 1 tablet by mouth once daily  . benazepril (LOTENSIN) 20 MG tablet Take 1 tablet (20 mg total) by mouth daily.  . clonazePAM (KLONOPIN) 0.5 MG tablet Take 1 tablet (0.5 mg total) by mouth 2 (two) times daily as needed.  . fish oil-omega-3 fatty acids 1000 MG capsule Take 2 g by mouth daily.  . furosemide (LASIX) 40 MG tablet Take 1 tablet (40 mg total) by mouth daily.  . meclizine (ANTIVERT) 25 MG tablet TAKE 1/2 TO 1 (ONE-HALF TO ONE) TABLET BY MOUTH THREE TIMES DAILY AS NEEDED FOR DIZZINESS  . Melatonin 5 MG TABS Take 5 mg by mouth at bedtime.  . metoprolol succinate (TOPROL XL) 25 MG 24 hr tablet Take 1 tablet (25 mg total) by mouth daily.  . Multiple Vitamins-Minerals (COMPLETE MULTIVITAMIN/MINERAL PO) Take 1 tablet by mouth daily.  Marland Kitchen NIFEdipine (PROCARDIA-XL/NIFEDICAL-XL) 30 MG 24 hr tablet Take 1 tablet (30 mg total) by mouth daily.  . Potassium 99 MG TABS Take 1 tablet (99 mg total)  by mouth daily.  . rosuvastatin (CRESTOR) 5 MG tablet Take 1 tablet (5 mg total) by mouth every other day.  . traMADol (ULTRAM) 50 MG tablet Take 1 tablet (50 mg total) by mouth 2 (two) times daily.  . traZODone (DESYREL) 50 MG tablet Take 0.5-1 tablets (25-50 mg total) by mouth at bedtime as needed. for sleep     Past Surgical History:  Procedure Laterality Date  . CARDIAC CATHETERIZATION N/A 06/27/2015   Procedure: Left Heart Cath and Coronary Angiography;  Surgeon: Troy Sine, MD;  Location: [facility];  Service: Cardiovascular;  Laterality: N/A;  . HERNIA REPAIR    . KIDNEY STONE SURGERY      Family History  Problem Relation Age of Onset  . Heart disease Father   . Heart attack Father   . Diabetes Sister   . Heart disease Brother   . Deep vein thrombosis Brother   . Congestive Heart Failure Brother   . Cancer Brother   . Leukemia Brother   . Heart attack Brother     New complaints: None today  Social history: Lives by hisself since his wife died but he has a friend he spend a lot of time with.  Controlled substance contract: n/a    Review of Systems  Constitutional: Negative for diaphoresis.  Eyes: Negative for pain.  Respiratory: Negative for shortness of breath.   Cardiovascular: Negative for chest pain, palpitations and leg swelling.  Gastrointestinal: Negative for abdominal pain.  Endocrine: Negative for polydipsia.  Skin: Negative for rash.  Neurological: Negative for dizziness, weakness and headaches.  Hematological: Does not bruise/bleed easily.  All other systems reviewed and are negative.      Objective:   Physical Exam Vitals and nursing note reviewed.  Constitutional:      Appearance: Normal appearance. He is well-developed and well-nourished.  HENT:     Head: Normocephalic.     Nose: Nose normal.     Mouth/Throat:     Mouth: Oropharynx is clear and moist.  Eyes:     Extraocular Movements: EOM normal.     Pupils: Pupils are  equal, round, and reactive to light.  Neck:     Thyroid: No thyroid mass or thyromegaly.     Vascular: No carotid bruit or JVD.     Trachea: Phonation normal.  Cardiovascular:     Rate and Rhythm: Normal rate and regular rhythm.  Pulmonary:  Effort: Pulmonary effort is normal. No respiratory distress.     Breath sounds: Normal breath sounds.  Abdominal:     General: Bowel sounds are normal. Aorta is normal.     Palpations: Abdomen is soft.     Tenderness: There is no abdominal tenderness.  Musculoskeletal:        General: Normal range of motion.     Cervical back: Normal range of motion and neck supple.     Right lower leg: Edema (1+) present.     Left lower leg: Edema (1+) present.  Lymphadenopathy:     Cervical: No cervical adenopathy.  Skin:    General: Skin is warm and dry.  Neurological:     Mental Status: He is alert and oriented to person, place, and time.  Psychiatric:        Mood and Affect: Mood and affect normal.        Behavior: Behavior normal.        Thought Content: Thought content normal.        Judgment: Judgment normal.    BP 128/69   Pulse 66   Temp 98 F (36.7 C)   Resp 20   Ht '5\' 8"'  (1.727 m)   Wt 262 lb (118.8 kg)   SpO2 97%   BMI 39.84 kg/m         Assessment & Plan:  ADRON GEISEL comes in today with chief complaint of Medical Management of Chronic Issues (Refill medications )   Diagnosis and orders addressed:  1. Primary hypertension Low sodium diet - CBC with Differential/Platelet - CMP14+EGFR  2. Raynaud's disease without gangrene Keep hands warm  3. Thoracic ascending aortic aneurysm ([place]) Sent message to DR. Gerhardt about CTA  4. Asymptomatic varicose veins of both lower extremities Keep legs warm Elevate when sitting  5. Hyperlipidemia with target LDL less than 100 Low fat diet - Lipid panel  6. Hypokalemia Labs pending  7. Idiopathic chronic gout of multiple sites without tophus Low purine diet  8. Benign  prostatic hyperplasia without lower urinary tract symptoms Labs pending Report any problems voiding - PSA, total and free  9. Primary insomnia Bedtime routine  10. Severe obesity (BMI >= 40) (HCC) Discussed diet and exercise for person with BMI >25 Will recheck weight in 3-6 months  11. Polyneuropathy - traMADol (ULTRAM) 50 MG tablet; Take 1 tablet (50 mg total) by mouth 2 (two) times daily.  Dispense: 60 tablet; Refill: 2   Labs pending Health Maintenance reviewed Diet and exercise encouraged  Follow up plan: 3 months   Mary-Margaret Hassell Done, FNP

## 2021-01-18 NOTE — Patient Instructions (Signed)
Fall Prevention in the Home, Adult Falls can cause injuries and can happen to people of all ages. There are many things you can do to make your home safe and to help prevent falls. Ask for help when making these changes. What actions can I take to prevent falls? General Instructions  Use good lighting in all rooms. Replace any light bulbs that burn out.  Turn on the lights in dark areas. Use night-lights.  Keep items that you use often in easy-to-reach places. Lower the shelves around your home if needed.  Set up your furniture so you have a clear path. Avoid moving your furniture around.  Do not have throw rugs or other things on the floor that can make you trip.  Avoid walking on wet floors.  If any of your floors are uneven, fix them.  Add color or contrast paint or tape to clearly mark and help you see: ? Grab bars or handrails. ? First and last steps of staircases. ? Where the edge of each step is.  If you use a stepladder: ? Make sure that it is fully opened. Do not climb a closed stepladder. ? Make sure the sides of the stepladder are locked in place. ? Ask someone to hold the stepladder while you use it.  Know where your pets are when moving through your home. What can I do in the bathroom?  Keep the floor dry. Clean up any water on the floor right away.  Remove soap buildup in the tub or shower.  Use nonskid mats or decals on the floor of the tub or shower.  Attach bath mats securely with double-sided, nonslip rug tape.  If you need to sit down in the shower, use a plastic, nonslip stool.  Install grab bars by the toilet and in the tub and shower. Do not use towel bars as grab bars.      What can I do in the bedroom?  Make sure that you have a light by your bed that is easy to reach.  Do not use any sheets or blankets for your bed that hang to the floor.  Have a firm chair with side arms that you can use for support when you get dressed. What can I do in  the kitchen?  Clean up any spills right away.  If you need to reach something above you, use a step stool with a grab bar.  Keep electrical cords out of the way.  Do not use floor polish or wax that makes floors slippery. What can I do with my stairs?  Do not leave any items on the stairs.  Make sure that you have a light switch at the top and the bottom of the stairs.  Make sure that there are handrails on both sides of the stairs. Fix handrails that are broken or loose.  Install nonslip stair treads on all your stairs.  Avoid having throw rugs at the top or bottom of the stairs.  Choose a carpet that does not hide the edge of the steps on the stairs.  Check carpeting to make sure that it is firmly attached to the stairs. Fix carpet that is loose or worn. What can I do on the outside of my home?  Use bright outdoor lighting.  Fix the edges of walkways and driveways and fix any cracks.  Remove anything that might make you trip as you walk through a door, such as a raised step or threshold.  Trim any   bushes or trees on paths to your home.  Check to see if handrails are loose or broken and that both sides of all steps have handrails.  Install guardrails along the edges of any raised decks and porches.  Clear paths of anything that can make you trip, such as tools or rocks.  Have leaves, snow, or ice cleared regularly.  Use sand or salt on paths during winter.  Clean up any spills in your garage right away. This includes grease or oil spills. What other actions can I take?  Wear shoes that: ? Have a low heel. Do not wear high heels. ? Have rubber bottoms. ? Feel good on your feet and fit well. ? Are closed at the toe. Do not wear open-toe sandals.  Use tools that help you move around if needed. These include: ? Canes. ? Walkers. ? Scooters. ? Crutches.  Review your medicines with your doctor. Some medicines can make you feel dizzy. This can increase your chance  of falling. Ask your doctor what else you can do to help prevent falls. Where to find more information  Centers for Disease Control and Prevention, [employer]: www.cdc.gov  National Institute on Aging: www.nia.nih.gov Contact a doctor if:  You are afraid of falling at home.  You feel weak, drowsy, or dizzy at home.  You fall at home. Summary  There are many simple things that you can do to make your home safe and to help prevent falls.  Ways to make your home safe include removing things that can make you trip and installing grab bars in the bathroom.  Ask for help when making these changes in your home. This information is not intended to replace advice given to you by your health care provider. Make sure you discuss any questions you have with your health care provider. Document Revised: 06/20/2020 Document Reviewed: 06/20/2020 Elsevier Patient Education  2021 [employer].  

## 2021-01-19 LAB — CMP14+EGFR
ALT: 23 IU/L (ref 0–44)
AST: 24 IU/L (ref 0–40)
Albumin/Globulin Ratio: 1.5 (ref 1.2–2.2)
Albumin: 4 g/dL (ref 3.7–4.7)
Alkaline Phosphatase: 65 IU/L (ref 44–121)
BUN/Creatinine Ratio: 14 (ref 10–24)
BUN: 14 mg/dL (ref 8–27)
Bilirubin Total: 0.4 mg/dL (ref 0.0–1.2)
CO2: 22 mmol/L (ref 20–29)
Calcium: 9.5 mg/dL (ref 8.6–10.2)
Chloride: 104 mmol/L (ref 96–106)
Creatinine, Ser: 0.99 mg/dL (ref 0.76–1.27)
GFR calc Af Amer: 84 mL/min/{1.73_m2} (ref >59)
GFR calc non Af Amer: 73 mL/min/{1.73_m2} (ref >59)
Globulin, Total: 2.6 g/dL (ref 1.5–4.5)
Glucose: 102 mg/dL — ABNORMAL HIGH (ref 65–99)
Potassium: 4.7 mmol/L (ref 3.5–5.2)
Sodium: 142 mmol/L (ref 134–144)
Total Protein: 6.6 g/dL (ref 6.0–8.5)

## 2021-01-19 LAB — CBC WITH DIFFERENTIAL/PLATELET
Basophils Absolute: 0.1 10*3/uL (ref 0.0–0.2)
Basos: 1 %
EOS (ABSOLUTE): 0.4 10*3/uL (ref 0.0–0.4)
Eos: 6 %
Hematocrit: 43.6 % (ref 37.5–51.0)
Hemoglobin: 14.2 g/dL (ref 13.0–17.7)
Immature Grans (Abs): 0 10*3/uL (ref 0.0–0.1)
Immature Granulocytes: 0 %
Lymphocytes Absolute: 1.3 10*3/uL (ref 0.7–3.1)
Lymphs: 20 %
MCH: 33 pg (ref 26.6–33.0)
MCHC: 32.6 g/dL (ref 31.5–35.7)
MCV: 101 fL — ABNORMAL HIGH (ref 79–97)
Monocytes Absolute: 0.8 10*3/uL (ref 0.1–0.9)
Monocytes: 13 %
Neutrophils Absolute: 3.9 10*3/uL (ref 1.4–7.0)
Neutrophils: 60 %
Platelets: 215 10*3/uL (ref 150–450)
RBC: 4.3 x10E6/uL (ref 4.14–5.80)
RDW: 13.2 % (ref 11.6–15.4)
WBC: 6.6 10*3/uL (ref 3.4–10.8)

## 2021-01-19 LAB — PSA, TOTAL AND FREE
PSA, Free Pct: 23.8 %
PSA, Free: 0.57 ng/mL
Prostate Specific Ag, Serum: 2.4 ng/mL (ref 0.0–4.0)

## 2021-01-19 LAB — LIPID PANEL
Chol/HDL Ratio: 3.8 ratio (ref 0.0–5.0)
Cholesterol, Total: 148 mg/dL (ref 100–199)
HDL: 39 mg/dL — ABNORMAL LOW (ref >39)
LDL Chol Calc (NIH): 85 mg/dL (ref 0–99)
Triglycerides: 138 mg/dL (ref 0–149)
VLDL Cholesterol Cal: 24 mg/dL (ref 5–40)

## 2021-01-28 ENCOUNTER — Other Ambulatory Visit: Payer: Self-pay | Admitting: Nurse Practitioner

## 2021-01-28 DIAGNOSIS — F411 Generalized anxiety disorder: Secondary | ICD-10-CM

## 2021-02-06 ENCOUNTER — Other Ambulatory Visit: Payer: Self-pay | Admitting: Cardiothoracic Surgery

## 2021-02-06 DIAGNOSIS — I712 Thoracic aortic aneurysm, without rupture, unspecified: Secondary | ICD-10-CM

## 2021-03-02 ENCOUNTER — Other Ambulatory Visit: Payer: Self-pay | Admitting: Nurse Practitioner

## 2021-03-02 DIAGNOSIS — M1A09X0 Idiopathic chronic gout, multiple sites, without tophus (tophi): Secondary | ICD-10-CM

## 2021-03-04 ENCOUNTER — Other Ambulatory Visit: Payer: Self-pay

## 2021-03-04 DIAGNOSIS — M1A09X0 Idiopathic chronic gout, multiple sites, without tophus (tophi): Secondary | ICD-10-CM

## 2021-03-04 MED ORDER — ALLOPURINOL 300 MG PO TABS: 300.0000 mg | ORAL_TABLET | Freq: Every day | ORAL | 0 refills | Status: DC

## 2021-03-05 DIAGNOSIS — Z0100 Encounter for examination of eyes and vision without abnormal findings: Secondary | ICD-10-CM | POA: Diagnosis not present

## 2021-03-10 ENCOUNTER — Other Ambulatory Visit: Payer: Self-pay | Admitting: Nurse Practitioner

## 2021-03-10 DIAGNOSIS — I10 Essential (primary) hypertension: Secondary | ICD-10-CM

## 2021-03-16 ENCOUNTER — Other Ambulatory Visit: Payer: Self-pay | Admitting: Nurse Practitioner

## 2021-03-16 DIAGNOSIS — I10 Essential (primary) hypertension: Secondary | ICD-10-CM

## 2021-03-28 ENCOUNTER — Ambulatory Visit: Payer: Medicare HMO | Admitting: Cardiothoracic Surgery

## 2021-03-28 ENCOUNTER — Other Ambulatory Visit: Payer: Medicare HMO

## 2021-04-04 DIAGNOSIS — H40033 Anatomical narrow angle, bilateral: Secondary | ICD-10-CM | POA: Diagnosis not present

## 2021-04-04 DIAGNOSIS — H04123 Dry eye syndrome of bilateral lacrimal glands: Secondary | ICD-10-CM | POA: Diagnosis not present

## 2021-04-17 ENCOUNTER — Ambulatory Visit (INDEPENDENT_AMBULATORY_CARE_PROVIDER_SITE_OTHER): Payer: Medicare HMO | Admitting: Nurse Practitioner

## 2021-04-17 ENCOUNTER — Other Ambulatory Visit: Payer: Self-pay

## 2021-04-17 ENCOUNTER — Encounter: Payer: Self-pay | Admitting: Nurse Practitioner

## 2021-04-17 VITALS — BP 133/77 | HR 82 | Temp 97.4°F | Resp 20 | Ht 68.0 in | Wt 259.0 lb

## 2021-04-17 DIAGNOSIS — M1A09X0 Idiopathic chronic gout, multiple sites, without tophus (tophi): Secondary | ICD-10-CM

## 2021-04-17 DIAGNOSIS — I7121 Aneurysm of the ascending aorta, without rupture: Secondary | ICD-10-CM

## 2021-04-17 DIAGNOSIS — I712 Thoracic aortic aneurysm, without rupture, unspecified: Secondary | ICD-10-CM

## 2021-04-17 DIAGNOSIS — N400 Enlarged prostate without lower urinary tract symptoms: Secondary | ICD-10-CM

## 2021-04-17 DIAGNOSIS — E876 Hypokalemia: Secondary | ICD-10-CM

## 2021-04-17 DIAGNOSIS — I10 Essential (primary) hypertension: Secondary | ICD-10-CM | POA: Diagnosis not present

## 2021-04-17 DIAGNOSIS — F5101 Primary insomnia: Secondary | ICD-10-CM | POA: Diagnosis not present

## 2021-04-17 DIAGNOSIS — G629 Polyneuropathy, unspecified: Secondary | ICD-10-CM | POA: Diagnosis not present

## 2021-04-17 DIAGNOSIS — E785 Hyperlipidemia, unspecified: Secondary | ICD-10-CM

## 2021-04-17 DIAGNOSIS — F411 Generalized anxiety disorder: Secondary | ICD-10-CM

## 2021-04-17 MED ORDER — TRAMADOL HCL 50 MG PO TABS: 50.0000 mg | ORAL_TABLET | Freq: Two times a day (BID) | ORAL | 2 refills | Status: DC

## 2021-04-17 MED ORDER — METOPROLOL SUCCINATE ER 25 MG PO TB24: 25.0000 mg | ORAL_TABLET | Freq: Every day | ORAL | 1 refills | Status: DC

## 2021-04-17 MED ORDER — ROSUVASTATIN CALCIUM 5 MG PO TABS: 5.0000 mg | ORAL_TABLET | ORAL | 1 refills | Status: DC

## 2021-04-17 MED ORDER — ALLOPURINOL 300 MG PO TABS: 300.0000 mg | ORAL_TABLET | Freq: Every day | ORAL | 0 refills | Status: DC

## 2021-04-17 MED ORDER — TRAZODONE HCL 50 MG PO TABS: 25.0000 mg | ORAL_TABLET | Freq: Every evening | ORAL | 1 refills | Status: DC | PRN

## 2021-04-17 MED ORDER — BENAZEPRIL HCL 20 MG PO TABS: 20.0000 mg | ORAL_TABLET | Freq: Every day | ORAL | 1 refills | Status: DC

## 2021-04-17 MED ORDER — NIFEDIPINE ER OSMOTIC RELEASE 30 MG PO TB24: 30.0000 mg | ORAL_TABLET | Freq: Every day | ORAL | 1 refills | Status: DC

## 2021-04-17 MED ORDER — CLONAZEPAM 0.5 MG PO TABS: 0.5000 mg | ORAL_TABLET | Freq: Two times a day (BID) | ORAL | 2 refills | Status: DC | PRN

## 2021-04-17 MED ORDER — POTASSIUM 99 MG PO TABS: 1.0000 | ORAL_TABLET | Freq: Every day | ORAL | 1 refills | Status: DC

## 2021-04-17 MED ORDER — FUROSEMIDE 40 MG PO TABS: 40.0000 mg | ORAL_TABLET | Freq: Every day | ORAL | 3 refills | Status: DC

## 2021-04-17 NOTE — Progress Notes (Signed)
Subjective:    Patient ID: Joseph [name], male    DOB: 1941-11-12, 80 y.o.   MRN: [id number]   Chief Complaint: Medical Management of Chronic Issues    HPI:  1. Primary hypertension No c/o chest pain, sob or headache. Does not check blood pressure at home. BP Readings from Last 3 Encounters:  01/18/21 128/69  01/10/21 (!) 122/58  10/18/20 125/73     2. Hyperlipidemia with target LDL less than 100 does not watch diet and does very little exercise. Lab Results  Component Value Date   CHOL 148 01/18/2021   HDL 39 (L) 01/18/2021   LDLCALC 85 01/18/2021   TRIG 138 01/18/2021   CHOLHDL 3.8 01/18/2021     3. Hypokalemia No c/o lower ext cramping. Lab Results  Component Value Date   K 4.7 01/18/2021     4. Thoracic ascending aortic aneurysm ([facility]) Had ct angiogram 05/24/20. Aneurysm was stable at that time. Recommended 6 month follow up.  5. Polyneuropathy Constant numbness and tingling of bil feet.   6. Benign prostatic hyperplasia without lower urinary tract symptoms No problems voiding. Lab Results  Component Value Date   PSA1 2.4 01/18/2021   PSA1 3.3 06/29/2018   PSA1 3.0 02/20/2017   PSA 3.3 01/08/2015   PSA 3.4 10/05/2014   PSA 2.9 06/13/2014      7. Idiopathic chronic gout of multiple sites without tophus No recent flare ups  8. Primary insomnia Is on trazadone to sleep at night. Sleeps about 7-8 hours. Feels rested in AM  9. Severe obesity (BMI >= 40) (HCC) Weight is down 3lbs Wt Readings from Last 3 Encounters:  04/17/21 259 lb (117.5 kg)  01/18/21 262 lb (118.8 kg)  01/10/21 261 lb (118.4 kg)   BMI Readings from Last 3 Encounters:  04/17/21 39.38 kg/m  01/18/21 39.84 kg/m  01/10/21 39.68 kg/m       Outpatient Encounter Medications as of 04/17/2021  Medication Sig  . allopurinol (ZYLOPRIM) 300 MG tablet Take 1 tablet (300 mg total) by mouth daily.  . benazepril (LOTENSIN) 20 MG tablet Take 1 tablet by mouth once daily  .  clonazePAM (KLONOPIN) 0.5 MG tablet Take 1 tablet by mouth twice daily as needed  . fish oil-omega-3 fatty acids 1000 MG capsule Take 2 g by mouth daily.  . furosemide (LASIX) 40 MG tablet Take 1 tablet (40 mg total) by mouth daily.  . meclizine (ANTIVERT) 25 MG tablet TAKE 1/2 TO 1 (ONE-HALF TO ONE) TABLET BY MOUTH THREE TIMES DAILY AS NEEDED FOR DIZZINESS  . metoprolol succinate (TOPROL XL) 25 MG 24 hr tablet Take 1 tablet (25 mg total) by mouth daily.  . Multiple Vitamins-Minerals (COMPLETE MULTIVITAMIN/MINERAL PO) Take 1 tablet by mouth daily.  Marland Kitchen NIFEdipine (PROCARDIA-XL/NIFEDICAL-XL) 30 MG 24 hr tablet Take 1 tablet (30 mg total) by mouth daily.  . Potassium 99 MG TABS Take 1 tablet (99 mg total) by mouth daily.  . rosuvastatin (CRESTOR) 5 MG tablet Take 1 tablet (5 mg total) by mouth every other day.  . traMADol (ULTRAM) 50 MG tablet Take 1 tablet (50 mg total) by mouth 2 (two) times daily.  . traZODone (DESYREL) 50 MG tablet Take 0.5-1 tablets (25-50 mg total) by mouth at bedtime as needed. for sleep   No facility-administered encounter medications on file as of 04/17/2021.    Past Surgical History:  Procedure Laterality Date  . CARDIAC CATHETERIZATION N/A 06/27/2015   Procedure: Left Heart Cath and Coronary Angiography;  Surgeon: Troy Sine, MD;  Location: [facility];  Service: Cardiovascular;  Laterality: N/A;  . HERNIA REPAIR    . KIDNEY STONE SURGERY      Family History  Problem Relation Age of Onset  . Heart disease Father   . Heart attack Father   . Diabetes Sister   . Heart disease Brother   . Deep vein thrombosis Brother   . Congestive Heart Failure Brother   . Cancer Brother   . Leukemia Brother   . Heart attack Brother     New complaints: None today  Social history: Lives by hisself- has a girl friend that spends a lot of time with  Controlled substance contract: n/a    Review of Systems  Constitutional: Negative for diaphoresis.  Eyes:  Negative for pain.  Respiratory: Negative for shortness of breath.   Cardiovascular: Negative for chest pain, palpitations and leg swelling.  Gastrointestinal: Negative for abdominal pain.  Endocrine: Negative for polydipsia.  Skin: Negative for rash.  Neurological: Negative for dizziness, weakness and headaches.  Hematological: Does not bruise/bleed easily.  All other systems reviewed and are negative.      Objective:   Physical Exam Vitals and nursing note reviewed.  Constitutional:      Appearance: Normal appearance. He is well-developed.  HENT:     Head: Normocephalic.     Nose: Nose normal.  Eyes:     Pupils: Pupils are equal, round, and reactive to light.  Neck:     Thyroid: No thyroid mass or thyromegaly.     Vascular: No carotid bruit or JVD.     Trachea: Phonation normal.  Cardiovascular:     Rate and Rhythm: Normal rate and regular rhythm.  Pulmonary:     Effort: Pulmonary effort is normal. No respiratory distress.     Breath sounds: Normal breath sounds.  Abdominal:     General: Bowel sounds are normal.     Palpations: Abdomen is soft.     Tenderness: There is no abdominal tenderness.  Musculoskeletal:        General: Normal range of motion.     Cervical back: Normal range of motion and neck supple.  Lymphadenopathy:     Cervical: No cervical adenopathy.  Skin:    General: Skin is warm and dry.  Neurological:     Mental Status: He is alert and oriented to person, place, and time.  Psychiatric:        Behavior: Behavior normal.        Thought Content: Thought content normal.        Judgment: Judgment normal.     BP 133/77   Pulse 82   Temp (!) 97.4 F (36.3 C) (Temporal)   Resp 20   Ht _0  (1.727 m)   Wt 259 lb (117.5 kg)   SpO2 95%   BMI 39.38 kg/m        Assessment & Plan:  DERRICO ZHONG comes in today with chief complaint of Medical Management of Chronic Issues   Diagnosis and orders addressed:  1. Primary hypertension Low sodium  diet - benazepril (LOTENSIN) 20 MG tablet; Take 1 tablet (20 mg total) by mouth daily.  Dispense: 90 tablet; Refill: 1 - furosemide (LASIX) 40 MG tablet; Take 1 tablet (40 mg total) by mouth daily.  Dispense: 90 tablet; Refill: 3 - metoprolol succinate (TOPROL XL) 25 MG 24 hr tablet; Take 1 tablet (25 mg total) by mouth daily.  Dispense: 90 tablet;  Refill: 1 - CBC with Differential/Platelet - CMP14+EGFR  2. Hyperlipidemia with target LDL less than 100 Low fat diet - rosuvastatin (CRESTOR) 5 MG tablet; Take 1 tablet (5 mg total) by mouth every other day.  Dispense: 45 tablet; Refill: 1 - Lipid panel  3. Hypokalemia labspending - Potassium 99 MG TABS; Take 1 tablet (99 mg total) by mouth daily.  Dispense: 90 tablet; Refill: 1  4. Thoracic ascending aortic aneurysm [facility]) Order CTA- may have to wait due to shortage of Dye - NIFEdipine (PROCARDIA-XL/NIFEDICAL-XL) 30 MG 24 hr tablet; Take 1 tablet (30 mg total) by mouth daily.  Dispense: 90 tablet; Refill: 1  5. Polyneuropathy Do not go barefooted - traMADol (ULTRAM) 50 MG tablet; Take 1 tablet (50 mg total) by mouth 2 (two) times daily.  Dispense: 60 tablet; Refill: 2  6. Benign prostatic hyperplasia without lower urinary tract symptoms Report and problems voiding  7. Idiopathic chronic gout of multiple sites without tophus Low purine diet - allopurinol (ZYLOPRIM) 300 MG tablet; Take 1 tablet (300 mg total) by mouth daily.  Dispense: 90 tablet; Refill: 0  8. Primary insomnia Bedtime routine - traZODone (DESYREL) 50 MG tablet; Take 0.5-1 tablets (25-50 mg total) by mouth at bedtime as needed. for sleep  Dispense: 90 tablet; Refill: 1  9. Severe obesity (BMI >= 40) (HCC) Discussed diet and exercise for person with BMI >25 Will recheck weight in 3-6 months  10. Thoracic aortic aneurysm without rupture (HCC) - CT ANGIO CHEST AORTA W/CM & OR WO/CM; Future  11. GAD (generalized anxiety disorder) Stress management - clonazePAM  (KLONOPIN) 0.5 MG tablet; Take 1 tablet (0.5 mg total) by mouth 2 (two) times daily as needed.  Dispense: 60 tablet; Refill: 2   Labs pending Health Maintenance reviewed Diet and exercise encouraged  Follow up plan: 3 months   Mary-Margaret Hassell Done, FNP

## 2021-04-17 NOTE — Patient Instructions (Signed)
Bradley and Daroff's neurology in clinical practice (8th ed., pp. 1853- 1929). Elsevier."> [employer]il medicine (26th ed., pp. 2489- 2501). Elsevier.">  Peripheral Neuropathy Peripheral neuropathy is a type of nerve damage. It affects nerves that carry signals between the spinal cord and the arms, legs, and the rest of the body (peripheral nerves). It does not affect nerves in the spinal cord or brain. In peripheral neuropathy, one nerve or a group of nerves may be damaged. Peripheral neuropathy is a broad category that includes many specific nerve disorders, like diabetic neuropathy, hereditary neuropathy, and carpal tunnel syndrome. What are the causes? This condition may be caused by:  Diabetes. This is the most common cause of peripheral neuropathy.  Nerve injury.  Pressure or stress on a nerve that lasts a long time.  Lack (deficiency) of B vitamins. This can result from alcoholism, poor diet, or a restricted diet.  Infections.  Autoimmune diseases, such as rheumatoid arthritis and systemic lupus erythematosus.  Nerve diseases that are passed from parent to child (inherited).  Some medicines, such as cancer medicines (chemotherapy).  Poisonous (toxic) substances, such as lead and mercury.  Too little blood flowing to the legs.  Kidney disease.  Thyroid disease. In some cases, the cause of this condition is not known. What are the signs or symptoms? Symptoms of this condition depend on which of your nerves is damaged. Common symptoms include:  Loss of feeling (numbness) in the feet, hands, or both.  Tingling in the feet, hands, or both.  Burning pain.  Very sensitive skin.  Weakness.  Not being able to move a part of the body (paralysis).  Muscle twitching.  Clumsiness or poor coordination.  Loss of balance.  Not being able to control your bladder.  Feeling dizzy.  Sexual problems. How is this diagnosed? Diagnosing and finding the cause of peripheral  neuropathy can be difficult. Your health care provider will take your medical history and do a physical exam. A neurological exam will also be done. This involves checking things that are affected by your brain, spinal cord, and nerves (nervous system). For example, your health care provider will check your reflexes, how you move, and what you can feel. You may have other tests, such as:  Blood tests.  Electromyogram (EMG) and nerve conduction tests. These tests check nerve function and how well the nerves are controlling the muscles.  Imaging tests, such as CT scans or MRI to rule out other causes of your symptoms.  Removing a small piece of nerve to be examined in a lab (nerve biopsy).  Removing and examining a small amount of the fluid that surrounds the brain and spinal cord (lumbar puncture). How is this treated? Treatment for this condition may involve:  Treating the underlying cause of the neuropathy, such as diabetes, kidney disease, or vitamin deficiencies.  Stopping medicines that can cause neuropathy, such as chemotherapy.  Medicine to help relieve pain. Medicines may include: ? Prescription or over-the-counter pain medicine. ? Antiseizure medicine. ? Antidepressants. ? Pain-relieving patches that are applied to painful areas of skin.  Surgery to relieve pressure on a nerve or to destroy a nerve that is causing pain.  Physical therapy to help improve movement and balance.  Devices to help you move around (assistive devices). Follow these instructions at home: Medicines  Take over-the-counter and prescription medicines only as told by your health care provider. Do not take any other medicines without first asking your health care provider.  Do not drive or use heavy   machinery while taking prescription pain medicine. Lifestyle  Do not use any products that contain nicotine or tobacco, such as cigarettes and e-cigarettes. Smoking keeps blood from reaching damaged nerves.  If you need help quitting, ask your health care provider.  Avoid or limit alcohol. Too much alcohol can cause a vitamin B deficiency, and vitamin B is needed for healthy nerves.  Eat a healthy diet. This includes: ? Eating foods that are high in fiber, such as fresh fruits and vegetables, whole grains, and beans. ? Limiting foods that are high in fat and processed sugars, such as fried or sweet foods.   General instructions  If you have diabetes, work closely with your health care provider to keep your blood sugar under control.  If you have numbness in your feet: ? Check every day for signs of injury or infection. Watch for redness, warmth, and swelling. ? Wear padded socks and comfortable shoes. These help protect your feet.  Develop a good support system. Living with peripheral neuropathy can be stressful. Consider talking with a mental health specialist or joining a support group.  Use assistive devices and attend physical therapy as told by your health care provider. This may include using a walker or a cane.  Keep all follow-up visits as told by your health care provider. This is important.   Contact a health care provider if:  You have new signs or symptoms of peripheral neuropathy.  You are struggling emotionally from dealing with peripheral neuropathy.  Your pain is not well-controlled. Get help right away if:  You have an injury or infection that is not healing normally.  You develop new weakness in an arm or leg.  You have fallen or do so frequently. Summary  Peripheral neuropathy is when the nerves in the arms, or legs are damaged, resulting in numbness, weakness, or pain.  There are many causes of peripheral neuropathy, including diabetes, pinched nerves, vitamin deficiencies, autoimmune disease, and hereditary conditions.  Diagnosing and finding the cause of peripheral neuropathy can be difficult. Your health care provider will take your medical history, do a  physical exam, and do tests, including blood tests and nerve function tests.  Treatment involves treating the underlying cause of the neuropathy and taking medicines to help control pain. Physical therapy and assistive devices may also help. This information is not intended to replace advice given to you by your health care provider. Make sure you discuss any questions you have with your health care provider. Document Revised: 08/28/2020 Document Reviewed: 08/28/2020 Elsevier Patient Education  2021 [employer]  

## 2021-04-18 LAB — CBC WITH DIFFERENTIAL/PLATELET
Basophils Absolute: 0 10*3/uL (ref 0.0–0.2)
Basos: 1 %
EOS (ABSOLUTE): 0.2 10*3/uL (ref 0.0–0.4)
Eos: 3 %
Hematocrit: 45.3 % (ref 37.5–51.0)
Hemoglobin: 14.4 g/dL (ref 13.0–17.7)
Immature Grans (Abs): 0 10*3/uL (ref 0.0–0.1)
Immature Granulocytes: 0 %
Lymphocytes Absolute: 1.5 10*3/uL (ref 0.7–3.1)
Lymphs: 19 %
MCH: 32.4 pg (ref 26.6–33.0)
MCHC: 31.8 g/dL (ref 31.5–35.7)
MCV: 102 fL — ABNORMAL HIGH (ref 79–97)
Monocytes Absolute: 0.7 10*3/uL (ref 0.1–0.9)
Monocytes: 8 %
Neutrophils Absolute: 5.7 10*3/uL (ref 1.4–7.0)
Neutrophils: 69 %
Platelets: 220 10*3/uL (ref 150–450)
RBC: 4.44 x10E6/uL (ref 4.14–5.80)
RDW: 14 % (ref 11.6–15.4)
WBC: 8.1 10*3/uL (ref 3.4–10.8)

## 2021-04-18 LAB — CMP14+EGFR
ALT: 21 IU/L (ref 0–44)
AST: 22 IU/L (ref 0–40)
Albumin/Globulin Ratio: 1.8 (ref 1.2–2.2)
Albumin: 4.2 g/dL (ref 3.7–4.7)
Alkaline Phosphatase: 67 IU/L (ref 44–121)
BUN/Creatinine Ratio: 14 (ref 10–24)
BUN: 13 mg/dL (ref 8–27)
Bilirubin Total: 0.5 mg/dL (ref 0.0–1.2)
CO2: 22 mmol/L (ref 20–29)
Calcium: 9.2 mg/dL (ref 8.6–10.2)
Chloride: 102 mmol/L (ref 96–106)
Creatinine, Ser: 0.96 mg/dL (ref 0.76–1.27)
Globulin, Total: 2.4 g/dL (ref 1.5–4.5)
Glucose: 104 mg/dL — ABNORMAL HIGH (ref 65–99)
Potassium: 4.4 mmol/L (ref 3.5–5.2)
Sodium: 139 mmol/L (ref 134–144)
Total Protein: 6.6 g/dL (ref 6.0–8.5)
eGFR: 81 mL/min/{1.73_m2} (ref >59)

## 2021-04-18 LAB — LIPID PANEL
Chol/HDL Ratio: 4.5 ratio (ref 0.0–5.0)
Cholesterol, Total: 163 mg/dL (ref 100–199)
HDL: 36 mg/dL — ABNORMAL LOW (ref >39)
LDL Chol Calc (NIH): 99 mg/dL (ref 0–99)
Triglycerides: 160 mg/dL — ABNORMAL HIGH (ref 0–149)
VLDL Cholesterol Cal: 28 mg/dL (ref 5–40)

## 2021-04-22 ENCOUNTER — Other Ambulatory Visit: Payer: Self-pay | Admitting: Cardiothoracic Surgery

## 2021-04-22 DIAGNOSIS — I712 Thoracic aortic aneurysm, without rupture, unspecified: Secondary | ICD-10-CM

## 2021-04-22 DIAGNOSIS — I7121 Aneurysm of the ascending aorta, without rupture: Secondary | ICD-10-CM

## 2021-04-23 ENCOUNTER — Telehealth: Payer: Self-pay | Admitting: Nurse Practitioner

## 2021-04-24 ENCOUNTER — Ambulatory Visit (INDEPENDENT_AMBULATORY_CARE_PROVIDER_SITE_OTHER): Payer: Medicare HMO

## 2021-04-24 VITALS — Ht 68.0 in | Wt 257.0 lb

## 2021-04-24 DIAGNOSIS — Z0000 Encounter for general adult medical examination without abnormal findings: Secondary | ICD-10-CM | POA: Diagnosis not present

## 2021-04-24 NOTE — Patient Instructions (Signed)
Joseph [name] , Thank you for taking time to come for your Medicare Wellness Visit. I appreciate your ongoing commitment to your health goals. Please review the following plan we discussed and let me know if I can assist you in the future.   Screening recommendations/referrals: Colonoscopy: No longer required Recommended yearly ophthalmology/optometry visit for glaucoma screening and checkup Recommended yearly dental visit for hygiene and checkup  Vaccinations: Influenza vaccine: Done 08/31/2020 - Repeat annually Pneumococcal vaccine: Done 01/02/2014 & 02/02/2018 Tdap vaccine: Done 10/05/2014 - Repeat in 10 years Shingles vaccine: Zostavax done 10/30/2015 - due for Shingrix discussed. Please contact your pharmacy for coverage information.    Covid-19: Done 12/20/19, 01/17/20, & 10/31/2020  Advanced directives: Please bring a copy of your health care power of attorney and living will to the office to be added to your chart at your convenience.  Conditions/risks identified: Aim for 30 minutes of exercise or brisk walking each day, drink 6-8 glasses of water and eat lots of fruits and vegetables.  Next appointment: Follow up in one year for your annual wellness visit.   Preventive Care 78 Years and Older, Male  Preventive care refers to lifestyle choices and visits with your health care provider that can promote health and wellness. What does preventive care include?  A yearly physical exam. This is also called an annual well check.  Dental exams once or twice a year.  Routine eye exams. Ask your health care provider how often you should have your eyes checked.  Personal lifestyle choices, including:  Daily care of your teeth and gums.  Regular physical activity.  Eating a healthy diet.  Avoiding tobacco and drug use.  Limiting alcohol use.  Practicing safe sex.  Taking low doses of aspirin every day.  Taking vitamin and mineral supplements as recommended by your health care  provider. What happens during an annual well check? The services and screenings done by your health care provider during your annual well check will depend on your age, overall health, lifestyle risk factors, and family history of disease. Counseling  Your health care provider may ask you questions about your:  Alcohol use.  Tobacco use.  Drug use.  Emotional well-being.  Home and relationship well-being.  Sexual activity.  Eating habits.  History of falls.  Memory and ability to understand (cognition).  Work and work Statistician. Screening  You may have the following tests or measurements:  Height, weight, and BMI.  Blood pressure.  Lipid and cholesterol levels. These may be checked every 5 years, or more frequently if you are over 47 years old.  Skin check.  Lung cancer screening. You may have this screening every year starting at age 62 if you have a 30-pack-year history of smoking and currently smoke or have quit within the past 15 years.  Fecal occult blood test (FOBT) of the stool. You may have this test every year starting at age 69.  Flexible sigmoidoscopy or colonoscopy. You may have a sigmoidoscopy every 5 years or a colonoscopy every 10 years starting at age 47.  Prostate cancer screening. Recommendations will vary depending on your family history and other risks.  Hepatitis C blood test.  Hepatitis B blood test.  Sexually transmitted disease (STD) testing.  Diabetes screening. This is done by checking your blood sugar (glucose) after you have not eaten for a while (fasting). You may have this done every 1-3 years.  Abdominal aortic aneurysm (AAA) screening. You may need this if you are a current or  former smoker.  Osteoporosis. You may be screened starting at age 34 if you are at high risk. Talk with your health care provider about your test results, treatment options, and if necessary, the need for more tests. Vaccines  Your health care provider  may recommend certain vaccines, such as:  Influenza vaccine. This is recommended every year.  Tetanus, diphtheria, and acellular pertussis (Tdap, Td) vaccine. You may need a Td booster every 10 years.  Zoster vaccine. You may need this after age 104.  Pneumococcal 13-valent conjugate (PCV13) vaccine. One dose is recommended after age 44.  Pneumococcal polysaccharide (PPSV23) vaccine. One dose is recommended after age 33. Talk to your health care provider about which screenings and vaccines you need and how often you need them. This information is not intended to replace advice given to you by your health care provider. Make sure you discuss any questions you have with your health care provider. Document Released: 12/14/2015 Document Revised: 08/06/2016 Document Reviewed: 09/18/2015 Elsevier Interactive Patient Education  2017 [place] Prevention in the Home Falls can cause injuries. They can happen to people of all ages. There are many things you can do to make your home safe and to help prevent falls. What can I do on the outside of my home?  Regularly fix the edges of walkways and driveways and fix any cracks.  Remove anything that might make you trip as you walk through a door, such as a raised step or threshold.  Trim any bushes or trees on the path to your home.  Use bright outdoor lighting.  Clear any walking paths of anything that might make someone trip, such as rocks or tools.  Regularly check to see if handrails are loose or broken. Make sure that both sides of any steps have handrails.  Any raised decks and porches should have guardrails on the edges.  Have any leaves, snow, or ice cleared regularly.  Use sand or salt on walking paths during winter.  Clean up any spills in your garage right away. This includes oil or grease spills. What can I do in the bathroom?  Use night lights.  Install grab bars by the toilet and in the tub and shower. Do not use  towel bars as grab bars.  Use non-skid mats or decals in the tub or shower.  If you need to sit down in the shower, use a plastic, non-slip stool.  Keep the floor dry. Clean up any water that spills on the floor as soon as it happens.  Remove soap buildup in the tub or shower regularly.  Attach bath mats securely with double-sided non-slip rug tape.  Do not have throw rugs and other things on the floor that can make you trip. What can I do in the bedroom?  Use night lights.  Make sure that you have a light by your bed that is easy to reach.  Do not use any sheets or blankets that are too big for your bed. They should not hang down onto the floor.  Have a firm chair that has side arms. You can use this for support while you get dressed.  Do not have throw rugs and other things on the floor that can make you trip. What can I do in the kitchen?  Clean up any spills right away.  Avoid walking on wet floors.  Keep items that you use a lot in easy-to-reach places.  If you need to reach something above you, use  a strong step stool that has a grab bar.  Keep electrical cords out of the way.  Do not use floor polish or wax that makes floors slippery. If you must use wax, use non-skid floor wax.  Do not have throw rugs and other things on the floor that can make you trip. What can I do with my stairs?  Do not leave any items on the stairs.  Make sure that there are handrails on both sides of the stairs and use them. Fix handrails that are broken or loose. Make sure that handrails are as long as the stairways.  Check any carpeting to make sure that it is firmly attached to the stairs. Fix any carpet that is loose or worn.  Avoid having throw rugs at the top or bottom of the stairs. If you do have throw rugs, attach them to the floor with carpet tape.  Make sure that you have a light switch at the top of the stairs and the bottom of the stairs. If you do not have them, ask  someone to add them for you. What else can I do to help prevent falls?  Wear shoes that:  Do not have high heels.  Have rubber bottoms.  Are comfortable and fit you well.  Are closed at the toe. Do not wear sandals.  If you use a stepladder:  Make sure that it is fully opened. Do not climb a closed stepladder.  Make sure that both sides of the stepladder are locked into place.  Ask someone to hold it for you, if possible.  Clearly mark and make sure that you can see:  Any grab bars or handrails.  First and last steps.  Where the edge of each step is.  Use tools that help you move around (mobility aids) if they are needed. These include:  Canes.  Walkers.  Scooters.  Crutches.  Turn on the lights when you go into a dark area. Replace any light bulbs as soon as they burn out.  Set up your furniture so you have a clear path. Avoid moving your furniture around.  If any of your floors are uneven, fix them.  If there are any pets around you, be aware of where they are.  Review your medicines with your doctor. Some medicines can make you feel dizzy. This can increase your chance of falling. Ask your doctor what other things that you can do to help prevent falls. This information is not intended to replace advice given to you by your health care provider. Make sure you discuss any questions you have with your health care provider. Document Released: 09/13/2009 Document Revised: 04/24/2016 Document Reviewed: 12/22/2014 Elsevier Interactive Patient Education  2017 [employer]  Managing Pain Without Opioids Opioids are strong medicines used to treat moderate to severe pain. For some people, especially those who have long-term (chronic) pain, opioids may not be the best choice for pain management due to:  Side effects like nausea, constipation, and sleepiness.  The risk of addiction (opioid use disorder). The longer you take opioids, the greater your risk of  addiction. Pain that lasts for more than 3 months is called chronic pain. Managing chronic pain usually requires more than one approach and is often provided by a team of health care providers working together (multidisciplinary approach). Pain management may be done at a pain management center or pain clinic. Types of pain management without opioids Managing pain without opioids can involve:  Non-opioid medicines.  Exercises  to help relieve pain and improve strength and range of motion (physical therapy).  Therapy to help with everyday tasks and activities (occupational therapy).  Therapy to help you find ways to relieve pain by doing things you enjoy (recreational therapy).  Talk therapy (psychotherapy) and other mental health therapies.  Medical treatments such as injections or devices.  Making lifestyle changes. Pain management options Non-opioid medicines Non-opioid medicines for pain may include medicines taken by mouth (oral medicines), such as:  Over-the-counter or prescription NSAIDs. These may be the first medicines used for pain. They work well for muscle and bone pain, and they reduce swelling.  Acetaminophen. This over-the-counter medicine may work well for milder pain but not swelling.  Antidepressants. These may be used to treat chronic pain. A certain type of antidepressant (tricyclics) is often used. These medicines are given in lower doses for pain than when used for depression.  Anticonvulsants. These are usually used to treat seizures but may also reduce nerve (neuropathic) pain.  Muscle relaxants. These relieve pain caused by sudden muscle tightening (spasms). You may also use a type of pain medicine that is applied to the skin as a patch, cream, or gel (topical analgesic), such as a numbing medicine. These may cause fewer side effects than oral medicines. Therapy Physical therapy involves doing exercises to gain strength and flexibility. A physical therapist may  teach you exercises to move and stretch parts of your body that are weak, stiff, or painful. You can learn these exercises at physical therapy visits and practice them at home. Physical therapy may also involve:  Massage.  Heat wraps or applying heat or cold to affected areas.  Sending electrical signals through the skin to interrupt pain signals (transcutaneous electrical nerve stimulation, TENS).  Sending weak lasers through the skin to reduce pain and swelling (low-level laser therapy).  Using signals from your body to help you learn to regulate pain (biofeedback). Occupational therapy helps you learn ways to function at home and work with less pain. Recreational therapy may involve trying new activities or hobbies, such as drawing or a physical activity. Types of mental health therapy for pain include:  Cognitive behavioral therapy (CBT) to help you learn coping skills for dealing with pain.  Acceptance and commitment therapy (ACT) to change the way you think and react to pain.  Relaxation therapies, including muscle relaxation exercises and focusing your mind on the present moment to lower stress (mindfulness-based stress reduction).  Pain management counseling. This may be individual, family, or group counseling.   Medical treatments Medical treatments for pain management include:  Nerve block injections. These may include a pain blocker and anti-inflammatory medicines. You may have injections: ? Near the spine to relieve chronic back or neck pain. ? Into joints to relieve back or joint pain. ? Into nerve areas that supply a painful area to relieve body pain. ? Into muscles (trigger point injections) to relieve some painful muscle conditions.  A medical device placed near your spine to help block pain signals and relieve nerve pain or chronic back pain (spinal cord stimulation device).  Acupuncture. Follow these instructions at home Medicines  Take over-the-counter and  prescription medicines only as told by your health care provider.  If you are taking pain medicine, ask your health care providers about possible side effects to watch out for.  Do not drive or use heavy machinery while taking prescription pain medicine. Lifestyle  Do not use drugs or alcohol to reduce pain. Limit alcohol intake to  no more than 1 drink a day for nonpregnant women and 2 drinks a day for men. One drink equals 12 oz of beer, 5 oz of wine, or 1 oz of hard liquor.  Do not use any products that contain nicotine or tobacco, such as cigarettes and e-cigarettes. These can delay healing. If you need help quitting, ask your health care provider.  Eat a healthy diet and maintain a healthy weight. Poor diet and excess weight may make pain worse. ? Eat foods that are high in fiber. These include fresh fruits and vegetables, whole grains, and beans. ? Limit foods that are high in fat and processed sugars, such as fried and sweet foods.  Exercise regularly. Exercise lowers stress and may help relieve pain. ? Ask your health care provider what activities and exercises are safe for you. ? If your health care provider approves, join an exercise class that combines movement and stress reduction. Examples include yoga and tai chi.  Get enough sleep. Lack of sleep may make pain worse.  Lower stress as much as possible. Practice stress reduction techniques as told by your therapist.   General instructions  Work with all your pain management providers to find the treatments that work best for you. You are an important member of your pain management team. There are many things you can do to reduce pain on your own.  Consider joining an online or in-person support group for people who have chronic pain.  Keep all follow-up visits as told by your health care providers. This is important. Where to find more information You can find more information about managing pain without opioids  from:  American Academy of Pain Medicine: painmed.[place] for Chronic Pain: instituteforchronicpain.org  American Chronic Pain Association: theacpa.org Contact a health care provider if:  You have side effects from pain medicine.  Your pain gets worse or does not get better with treatments or home care.  You are struggling with anxiety or depression. Summary  Many types of pain can be managed without opioids. Chronic pain may respond better to pain management without opioids.  Pain is best managed with a team of providers working together.  Pain management without opioids may include non-opioid medicines, medical treatments, physical therapy, mental health therapy, and lifestyle changes.  Tell your health care providers if your pain gets worse or is not being managed well enough. This information is not intended to replace advice given to you by your health care provider. Make sure you discuss any questions you have with your health care provider. Document Revised: 08/30/2020 Document Reviewed: 08/30/2020 Elsevier Patient Education  [place].

## 2021-04-24 NOTE — Progress Notes (Signed)
Subjective:   YOUSSEF [name] is a 80 y.o. male who presents for Medicare Annual/Subsequent preventive examination.  Virtual Visit via Telephone Note  I connected with  Joseph [name] on 04/24/21 at  9:45 AM EDT by telephone and verified that I am speaking with the correct person using two identifiers.  Location: Patient: Home Provider: [facility] Persons participating in the virtual visit: patient/Nurse Health Advisor   I discussed the limitations, risks, security and privacy concerns of performing an evaluation and management service by telephone and the availability of in person appointments. The patient expressed understanding and agreed to proceed.  Interactive audio and video telecommunications were attempted between this nurse and patient, however failed, due to patient having technical difficulties OR patient did not have access to video capability.  We continued and completed visit with audio only.  Some vital signs may be absent or patient reported.   Joseph [name] Joseph Angelika Jerrett, LPN   Review of Systems     Cardiac Risk Factors include: advanced age (>66men, >23 women);family history of premature cardiovascular disease;hypertension;male gender;dyslipidemia;obesity (BMI >30kg/m2);sedentary lifestyle     Objective:    Today's Vitals   04/24/21 0948  Weight: 257 lb (116.6 kg)  Height: 5\' 8"  (1.727 m)   Body mass index is 39.08 kg/m.  Advanced Directives 04/24/2021 08/05/2019 08/03/2018 06/27/2015 01/08/2015 10/05/2014  Does Patient Have a Medical Advance Directive? Yes Yes Yes No No No  Type of Paramedic of [place];Living will [place];Living will [place];Living will - - -  Does patient want to make changes to medical advance directive? - No - Patient declined No - Patient declined - - Yes - information given  Copy of [place] in Chart? No - copy requested No - copy requested No - copy requested - - -  Would  patient like information on creating a medical advance directive? - - - No - patient declined information Yes - Educational materials given Yes - Educational materials given    Current Medications (verified) Outpatient Encounter Medications as of 04/24/2021  Medication Sig  . allopurinol (ZYLOPRIM) 300 MG tablet Take 1 tablet (300 mg total) by mouth daily.  . benazepril (LOTENSIN) 20 MG tablet Take 1 tablet (20 mg total) by mouth daily.  . clonazePAM (KLONOPIN) 0.5 MG tablet Take 1 tablet (0.5 mg total) by mouth 2 (two) times daily as needed.  . fish oil-omega-3 fatty acids 1000 MG capsule Take 2 g by mouth daily.  . furosemide (LASIX) 40 MG tablet Take 1 tablet (40 mg total) by mouth daily.  . meclizine (ANTIVERT) 25 MG tablet TAKE 1/2 TO 1 (ONE-HALF TO ONE) TABLET BY MOUTH THREE TIMES DAILY AS NEEDED FOR DIZZINESS  . metoprolol succinate (TOPROL XL) 25 MG 24 hr tablet Take 1 tablet (25 mg total) by mouth daily.  . Multiple Vitamins-Minerals (COMPLETE MULTIVITAMIN/MINERAL PO) Take 1 tablet by mouth daily.  Marland Kitchen NIFEdipine (PROCARDIA-XL/NIFEDICAL-XL) 30 MG 24 hr tablet Take 1 tablet (30 mg total) by mouth daily.  . Potassium 99 MG TABS Take 1 tablet (99 mg total) by mouth daily.  . rosuvastatin (CRESTOR) 5 MG tablet Take 1 tablet (5 mg total) by mouth every other day.  . traMADol (ULTRAM) 50 MG tablet Take 1 tablet (50 mg total) by mouth 2 (two) times daily.  . traZODone (DESYREL) 50 MG tablet Take 0.5-1 tablets (25-50 mg total) by mouth at bedtime as needed. for sleep   No facility-administered encounter medications  on file as of 04/24/2021.    Allergies (verified) Carafate [sucralfate], Ciprofloxacin hcl, Prevacid [lansoprazole], Quinolones, and Sulfa antibiotics   History: Past Medical History:  Diagnosis Date  . Anxiety   . Ascending aortic aneurysm ([place])   . Essential hypertension   . GERD (gastroesophageal reflux disease)   . Gout   . History of kidney stones   . Insomnia     Past Surgical History:  Procedure Laterality Date  . CARDIAC CATHETERIZATION N/A 06/27/2015   Procedure: Left Heart Cath and Coronary Angiography;  Surgeon: Troy Sine, MD;  [facility];  Service: Cardiovascular;  Laterality: N/A;  . HERNIA REPAIR    . KIDNEY STONE SURGERY     Family History  Problem Relation Age of Onset  . Heart disease Father   . Heart attack Father   . Diabetes Sister   . Heart disease Brother   . Deep vein thrombosis Brother   . Congestive Heart Failure Brother   . Cancer Brother   . Leukemia Brother   . Heart attack Brother    Social History   Socioeconomic History  . Marital status: Significant Other    Spouse name: Not on file  . Number of children: 2  . Years of education: 10  . Highest education level: 10th grade  Occupational History  . 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 Smoking status: Former Smoker    Packs/day: 0.50    Years: 1.00    Pack years: 0.50    Types: Cigarettes    Quit date: 03/22/1958    Years since quitting: 63.1  . Smokeless tobacco: Former Systems developer    Types: Chew  . Tobacco comment: QUIT CHEWING IN 1989  Vaping Use  . Vaping Use: Never used  Substance and Sexual Activity  . Alcohol use: No    Alcohol/week: 0.0 standard drinks  . Drug use: No  . Sexual activity: Not on file  Other Topics Concern  . Not on file  Social History Narrative   Lives alone, but sometimes Herma Ard, significant other stays with him; He has 2 daughters who visit often   Right handed    Caffeine use: none   Social Determinants of Health   Financial Resource Strain: Low Risk   . Difficulty of Paying Living Expenses: Not hard at all  Food Insecurity: No Food Insecurity  . Worried About Charity fundraiser in the Last Year: Never true  . Ran Out of Food in the Last Year: Never true  Transportation Needs: No Transportation Needs  . Lack of Transportation  (Medical): No  . Lack of Transportation (Non-Medical): No  Physical Activity: Sufficiently Active  . Days of Exercise per Week: 7 days  . Minutes of Exercise per Session: 30 min  Stress: No Stress Concern Present  . Feeling of Stress : Only a little  Social Connections: Moderately Integrated  . Frequency of Communication with Friends and Family: More than three times a week  . Frequency of Social Gatherings with Friends and Family: More than three times a week  . Attends Religious Services: More than 4 times per year  . Active Member of Clubs or Organizations: Yes  . Attends Archivist Meetings: More than 4 times per year  . Marital Status: Widowed    Tobacco Counseling Counseling given: Not Answered Comment: QUIT CHEWING IN 1989   Clinical Intake:  Pre-visit preparation completed: Yes  Pain : No/denies pain     BMI - recorded: 39.08 Nutritional Status: BMI > 30  Obese Nutritional Risks: None Diabetes: No  How often do Joseph need to have someone help Joseph when Joseph read instructions, pamphlets, or other written materials from your doctor or pharmacy?: 1 - Never  Diabetic? No  Interpreter Needed?: No  Information entered by :: Trinna Kunst, LPN   Activities of Daily Living In your present state of health, do Joseph have any difficulty performing the following activities: 04/24/2021 10/18/2020  Hearing? Y N  Vision? N N  Difficulty concentrating or making decisions? N N  Walking or climbing stairs? N N  Dressing or bathing? N N  Doing errands, shopping? N N  Preparing Food and eating ? N -  Using the Toilet? N -  In the past six months, have Joseph accidently leaked urine? N -  Do Joseph have problems with loss of bowel control? N -  Managing your Medications? N -  Managing your Finances? N -  Housekeeping or managing your Housekeeping? N -  Some recent data might be hidden    Patient Care Team: Chevis Pretty, FNP as PCP - General (Nurse  Practitioner) Satira Sark, MD as PCP - Cardiology (Cardiology) Satira Sark, MD as Consulting Physician (Cardiology) Ilean China, RN as Registered Nurse Celestia Khat, [place] (Optometry)  Indicate any recent Medical Services Joseph may have received from other than [facility] providers in the past year (date may be approximate).     Assessment:   This is a routine wellness examination for Curly.  Hearing/Vision screen  Hearing Screening   125Hz  250Hz  500Hz  1000Hz  2000Hz  3000Hz  4000Hz  6000Hz  8000Hz   Right ear:           Left ear:           Comments: C/o moderate hearing loss; declines hearing aids  Vision Screening Comments: Wears eyeglasses at times - Annual visits with Dr Marin Comment in [place] issues and exercise activities discussed: Current Exercise Habits: Home exercise routine, Type of exercise: walking, Time (Minutes): 30, Frequency (Times/Week): 7, Weekly Exercise (Minutes/Week): 210, Intensity: Mild, Exercise limited by: orthopedic condition(s)  Goals Addressed            This Visit's Progress   . Exercise 150 min/wk Moderate Activity   On track     Depression Screen PHQ 2/9 Scores 04/24/2021 04/17/2021 01/18/2021 10/18/2020 07/17/2020 04/09/2020 01/06/2020  PHQ - 2 Score 0 0 0 0 0 0 0  PHQ- 9 Score 3 0 - - - - -    Fall Risk Fall Risk  04/24/2021 04/17/2021 01/18/2021 10/18/2020 07/17/2020  Falls in the past year? 1 1 0 1 0  Number falls in past yr: 1 0 - 1 -  Injury with Fall? 0 0 - 0 -  Risk for fall due to : Impaired balance/gait;Orthopedic patient;History of fall(s);Impaired vision;Medication side effect History of fall(s) - History of fall(s) -  Follow up Education provided;Falls prevention discussed Education provided - Falls evaluation completed -    FALL RISK PREVENTION PERTAINING TO THE HOME:  Any stairs in or around the home? Yes  If so, are there any without handrails? No  Home free of loose throw rugs in walkways, pet beds, electrical cords, etc? Yes   Adequate lighting in your home to reduce risk of falls? Yes   ASSISTIVE DEVICES UTILIZED TO PREVENT FALLS:  Life alert? No  Use of a cane, walker or w/c? Yes  Grab bars in the bathroom? No  Shower chair or bench in shower? No  Elevated toilet seat or a handicapped toilet? No   TIMED UP AND GO:  Was the test performed? No . Telephonic visit  Cognitive Function: Normal cognitive status assessed by direct observation by this Nurse Health Advisor. No abnormalities found.   MMSE - Mini Mental State Exam 08/03/2018  Orientation to time 5  Orientation to Place 5  Registration 3  Attention/ Calculation 4  Recall 3  Language- name 2 objects 2  Language- repeat 1  Language- follow 3 step command 3  Language- read & follow direction 1  Write a sentence 1  Copy design 1  Total score 29     6CIT Screen 08/05/2019  What Year? 0 points  What month? 0 points  What time? 0 points  Count back from 20 0 points  Months in reverse 4 points  Repeat phrase 0 points  Total Score 4    Immunizations Immunization History  Administered Date(s) Administered  . Influenza, High Dose Seasonal PF 09/11/2017, 10/07/2019  . Influenza,inj,Quad PF,6+ Mos 10/05/2014, 10/30/2015, 08/28/2016, 10/01/2018  . Influenza-Unspecified 08/31/2020  . Moderna Sars-Covid-2 Vaccination 12/20/2019, 01/17/2020, 10/31/2020  . Pneumococcal Conjugate-13 04/18/2015  . Pneumococcal Polysaccharide-23 01/02/2014, 02/02/2018  . Pneumococcal-Unspecified 02/02/2018  . Tdap 10/05/2014  . Zoster 10/30/2015    TDAP status: Up to date  Flu Vaccine status: Up to date  Pneumococcal vaccine status: Up to date  Covid-19 vaccine status: Completed vaccines  Qualifies for Shingles Vaccine? Yes   Zostavax completed Yes   Shingrix Completed?: No.    Education has been provided regarding the importance of this vaccine. Patient has been advised to call insurance company to determine out of pocket expense if they have not yet  received this vaccine. Advised may also receive vaccine at local pharmacy or Health Dept. Verbalized acceptance and understanding.  Screening Tests Health Maintenance  Topic Date Due  . Hepatitis C Screening  07/17/2021 (Originally 12/22/1958)  . INFLUENZA VACCINE  07/01/2021  . TETANUS/TDAP  10/05/2024  . COVID-19 Vaccine  Completed  . PNA vac Low Risk Adult  Completed  . HPV VACCINES  Aged Out    Health Maintenance  There are no preventive care reminders to display for this patient.  Colorectal cancer screening: No longer required.   Lung Cancer Screening: (Low Dose CT Chest recommended if Age 49-80 years, 30 pack-year currently smoking OR have quit w/in 15years.) does not qualify.   Additional Screening:  Hepatitis C Screening: does not qualify  Vision Screening: Recommended annual ophthalmology exams for early detection of glaucoma and other disorders of the eye. Is the patient up to date with their annual eye exam?  Yes  Who is the provider or what is the name of the office in which the patient attends annual eye exams? Dr Marin Comment in [place] If pt is not established with a provider, would they like to be referred to a provider to establish care? No .   Dental Screening: Recommended annual dental exams for proper oral hygiene  Community Resource Referral / Chronic Care Management: CRR required this visit?  No   CCM required this visit?  No      Plan:     I have personally reviewed and noted the following in the patient's chart:   . Medical and social history . Use of alcohol, tobacco or illicit drugs  . Current medications and supplements including opioid prescriptions. Patient is currently taking opioid prescriptions.  Information provided to patient regarding non-opioid alternatives. Patient advised to discuss non-opioid treatment plan with their provider. . Functional ability and status . Nutritional status . Physical activity . Advanced directives . List of other  physicians . Hospitalizations, surgeries, and ER visits in previous 12 months . Vitals . Screenings to include cognitive, depression, and falls . Referrals and appointments  In addition, I have reviewed and discussed with patient certain preventive protocols, quality metrics, and best practice recommendations. A written personalized care plan for preventive services as well as general preventive health recommendations were provided to patient.     Sandrea Hammond, LPN   06/17/5500   Nurse Notes: None

## 2021-04-30 ENCOUNTER — Ambulatory Visit (HOSPITAL_COMMUNITY): Payer: Medicare HMO

## 2021-05-30 ENCOUNTER — Other Ambulatory Visit: Payer: Self-pay | Admitting: Nurse Practitioner

## 2021-05-30 DIAGNOSIS — M1A09X0 Idiopathic chronic gout, multiple sites, without tophus (tophi): Secondary | ICD-10-CM

## 2021-06-06 ENCOUNTER — Other Ambulatory Visit: Payer: Self-pay

## 2021-06-06 ENCOUNTER — Ambulatory Visit
Admission: RE | Admit: 2021-06-06 | Discharge: 2021-06-06 | Disposition: A | Discharge status: Home / Self Care | Payer: Medicare HMO | Source: Ambulatory Visit | Attending: Cardiothoracic Surgery | Admitting: Cardiothoracic Surgery

## 2021-06-06 ENCOUNTER — Ambulatory Visit: Payer: Medicare HMO | Admitting: Cardiothoracic Surgery

## 2021-06-06 VITALS — BP 132/73 | HR 73 | Resp 20 | Ht 68.0 in | Wt 255.0 lb

## 2021-06-06 DIAGNOSIS — I7121 Aneurysm of the ascending aorta, without rupture: Secondary | ICD-10-CM

## 2021-06-06 DIAGNOSIS — I712 Thoracic aortic aneurysm, without rupture, unspecified: Secondary | ICD-10-CM

## 2021-06-06 DIAGNOSIS — I700 Atherosclerosis of aorta: Secondary | ICD-10-CM | POA: Diagnosis not present

## 2021-06-06 DIAGNOSIS — I2510 Atherosclerotic heart disease of native coronary artery without angina pectoris: Secondary | ICD-10-CM | POA: Diagnosis not present

## 2021-06-06 DIAGNOSIS — I288 Other diseases of pulmonary vessels: Secondary | ICD-10-CM | POA: Diagnosis not present

## 2021-06-06 MED ORDER — IOPAMIDOL (ISOVUE-370) INJECTION 76%
75.0000 mL | Freq: Once | INTRAVENOUS | Status: AC | PRN
  Administered 2021-06-06: 75 mL via INTRAVENOUS

## 2021-06-06 NOTE — Progress Notes (Signed)
Chief complaint: Aneurysm surveillance  History of present illness: 80 year old man presents for surveillance of ascending aortic aneurysm.  This was first detected approximately 6 years ago.  He has been measuring in the 4.8 cm range.  He is controlling his blood pressure well at home with medications.  He denies any chest pain or shortness of breath.  Active Ambulatory Problems    Diagnosis Date Noted   Hypertension 03/22/2013   Hyperlipidemia with target LDL less than 100 03/22/2013   Insomnia 03/22/2013   Gout 04/04/2014   BPH (benign prostatic hyperplasia) 07/10/2014   Severe obesity (BMI >= 40) ([place]) 04/18/2015   Raynauds disease 04/18/2015   Abnormal myocardial perfusion study    Thoracic ascending aortic aneurysm ([place]) 07/27/2015   Varicose veins of both lower extremities 07/27/2015   Hypokalemia 10/30/2015   Polyneuropathy 06/07/2019   GAD (generalized anxiety disorder) 04/17/2021   Resolved Ambulatory Problems    Diagnosis Date Noted   No Resolved Ambulatory Problems   Past Medical History:  Diagnosis Date   Anxiety    Ascending aortic aneurysm ([facility])    Essential hypertension    GERD (gastroesophageal reflux disease)    History of kidney stones      Current Outpatient Medications on File Prior to Visit  Medication Sig Dispense Refill   allopurinol (ZYLOPRIM) 300 MG tablet Take 1 tablet (300 mg total) by mouth daily. 90 tablet 0   benazepril (LOTENSIN) 20 MG tablet Take 1 tablet (20 mg total) by mouth daily. 90 tablet 1   clonazePAM (KLONOPIN) 0.5 MG tablet Take 1 tablet (0.5 mg total) by mouth 2 (two) times daily as needed. 60 tablet 2   fish oil-omega-3 fatty acids 1000 MG capsule Take 2 g by mouth daily.     furosemide (LASIX) 40 MG tablet Take 1 tablet (40 mg total) by mouth daily. 90 tablet 3   meclizine (ANTIVERT) 25 MG tablet TAKE 1/2 TO 1 (ONE-HALF TO ONE) TABLET BY MOUTH THREE TIMES DAILY AS NEEDED FOR DIZZINESS 30 tablet 0   metoprolol succinate (TOPROL XL)  25 MG 24 hr tablet Take 1 tablet (25 mg total) by mouth daily. 90 tablet 1   Multiple Vitamins-Minerals (COMPLETE MULTIVITAMIN/MINERAL PO) Take 1 tablet by mouth daily.     NIFEdipine (PROCARDIA-XL/NIFEDICAL-XL) 30 MG 24 hr tablet Take 1 tablet (30 mg total) by mouth daily. 90 tablet 1   Potassium 99 MG TABS Take 1 tablet (99 mg total) by mouth daily. 90 tablet 1   rosuvastatin (CRESTOR) 5 MG tablet Take 1 tablet (5 mg total) by mouth every other day. 45 tablet 1   traMADol (ULTRAM) 50 MG tablet Take 1 tablet (50 mg total) by mouth 2 (two) times daily. 60 tablet 2   traZODone (DESYREL) 50 MG tablet Take 0.5-1 tablets (25-50 mg total) by mouth at bedtime as needed. for sleep 90 tablet 1   No current facility-administered medications on file prior to visit.    Physical exam: BP 132/73   Pulse 73   Resp 20   Ht 5\' 8"  (1.727 m)   Wt 115.7 kg   SpO2 92% Comment: RA  BMI 38.77 kg/m  Well-appearing no acute distress Clear to auscultation bilaterally Regular rate and rhythm Mild edema  Imaging: I personally reviewed his available imaging studies including chest CT from today which demonstrates a stable 4.8 cm ascending aortic aneurysm  Impression/plan: Stable aneurysm poses little threat for rupture or dissection at this point Continue blood pressure control Follow-up in 1  year with noncontrast chest CT Romulo Okray Z. Orvan Seen, [place]

## 2021-06-21 ENCOUNTER — Other Ambulatory Visit: Payer: Self-pay | Admitting: Cardiology

## 2021-06-21 DIAGNOSIS — I10 Essential (primary) hypertension: Secondary | ICD-10-CM

## 2021-07-18 ENCOUNTER — Encounter: Payer: Self-pay | Admitting: Nurse Practitioner

## 2021-07-18 ENCOUNTER — Ambulatory Visit (INDEPENDENT_AMBULATORY_CARE_PROVIDER_SITE_OTHER): Payer: Medicare HMO | Admitting: Nurse Practitioner

## 2021-07-18 ENCOUNTER — Other Ambulatory Visit: Payer: Self-pay

## 2021-07-18 VITALS — BP 125/78 | HR 70 | Temp 97.9°F | Resp 20 | Ht 68.0 in | Wt 258.0 lb

## 2021-07-18 DIAGNOSIS — F5101 Primary insomnia: Secondary | ICD-10-CM

## 2021-07-18 DIAGNOSIS — E785 Hyperlipidemia, unspecified: Secondary | ICD-10-CM | POA: Diagnosis not present

## 2021-07-18 DIAGNOSIS — E876 Hypokalemia: Secondary | ICD-10-CM

## 2021-07-18 DIAGNOSIS — N400 Enlarged prostate without lower urinary tract symptoms: Secondary | ICD-10-CM

## 2021-07-18 DIAGNOSIS — I712 Thoracic aortic aneurysm, without rupture: Secondary | ICD-10-CM

## 2021-07-18 DIAGNOSIS — F411 Generalized anxiety disorder: Secondary | ICD-10-CM

## 2021-07-18 DIAGNOSIS — I8393 Asymptomatic varicose veins of bilateral lower extremities: Secondary | ICD-10-CM

## 2021-07-18 DIAGNOSIS — I10 Essential (primary) hypertension: Secondary | ICD-10-CM

## 2021-07-18 DIAGNOSIS — I7121 Aneurysm of the ascending aorta, without rupture: Secondary | ICD-10-CM

## 2021-07-18 DIAGNOSIS — G629 Polyneuropathy, unspecified: Secondary | ICD-10-CM | POA: Diagnosis not present

## 2021-07-18 MED ORDER — TRAMADOL HCL 50 MG PO TABS: 50.0000 mg | ORAL_TABLET | Freq: Two times a day (BID) | ORAL | 2 refills | Status: DC

## 2021-07-18 MED ORDER — FUROSEMIDE 40 MG PO TABS: 40.0000 mg | ORAL_TABLET | Freq: Every day | ORAL | 3 refills | Status: DC

## 2021-07-18 MED ORDER — POTASSIUM 99 MG PO TABS: 1.0000 | ORAL_TABLET | Freq: Every day | ORAL | 1 refills | Status: DC

## 2021-07-18 MED ORDER — ROSUVASTATIN CALCIUM 5 MG PO TABS: 5.0000 mg | ORAL_TABLET | ORAL | 1 refills | Status: DC

## 2021-07-18 MED ORDER — METOPROLOL SUCCINATE ER 25 MG PO TB24: 25.0000 mg | ORAL_TABLET | Freq: Every day | ORAL | 1 refills | Status: DC

## 2021-07-18 MED ORDER — TRAZODONE HCL 50 MG PO TABS: 25.0000 mg | ORAL_TABLET | Freq: Every evening | ORAL | 1 refills | Status: DC | PRN

## 2021-07-18 MED ORDER — NIFEDIPINE ER OSMOTIC RELEASE 30 MG PO TB24: 30.0000 mg | ORAL_TABLET | Freq: Every day | ORAL | 1 refills | Status: DC

## 2021-07-18 MED ORDER — BENAZEPRIL HCL 20 MG PO TABS: 20.0000 mg | ORAL_TABLET | Freq: Every day | ORAL | 1 refills | Status: DC

## 2021-07-18 MED ORDER — CLONAZEPAM 0.5 MG PO TABS: 0.5000 mg | ORAL_TABLET | Freq: Two times a day (BID) | ORAL | 2 refills | Status: DC | PRN

## 2021-07-18 NOTE — Progress Notes (Signed)
Subjective:    Patient ID: Joseph [name], male    DOB: [birth year], 80 y.o.   MRN: [id number]   Chief Complaint: Medical Management of Chronic Issues    HPI:  1. Primary hypertension No c/o chest pain, sob or headache. Does not check blood pressure at home. BP Readings from Last 3 Encounters:  07/18/21 125/78  06/06/21 132/73  04/17/21 133/77     2. Hyperlipidemia with target LDL less than 100 Does not watch diet and does no exercise. Lab Results  Component Value Date   CHOL 163 04/17/2021   HDL 36 (L) 04/17/2021   LDLCALC 99 04/17/2021   TRIG 160 (H) 04/17/2021   CHOLHDL 4.5 04/17/2021     3. Thoracic ascending aortic aneurysm ([place]) Had CT on 06/06/21. No change s, and  recommended semi annual follow up.  4. Asymptomatic varicose veins of both lower extremities Has multiple varicose veins. He says his legs feel heavy at times.  5. Polyneuropathy Has constant numbness and tingling in bil feet.  6. Benign prostatic hyperplasia without lower urinary tract symptoms No problems voiding. Lab Results  Component Value Date   PSA1 2.4 01/18/2021   PSA1 3.3 06/29/2018   PSA1 3.0 02/20/2017   PSA 3.3 01/08/2015   PSA 3.4 10/05/2014   PSA 2.9 06/13/2014      7. GAD (generalized anxiety disorder) Is on klonopin. Has to take everyday.  GAD 7 : Generalized Anxiety Score 04/17/2021 01/18/2021 07/17/2020 04/09/2020  Nervous, Anxious, on Edge 0 0 0 0  Control/stop worrying 0 0 0 0  Worry too much - different things 0 0 0 0  Trouble relaxing 0 0 0 0  Restless 0 0 0 0  Easily annoyed or irritable 0 0 0 0  Afraid - awful might happen 0 0 0 0  Total GAD 7 Score 0 0 0 0  Anxiety Difficulty Not difficult at all Not difficult at all Not difficult at all Not difficult at all      8. Hypokalemia Denies any lower ext cramping Lab Results  Component Value Date   K 4.4 04/17/2021     9. Primary insomnia Is on trazadone to sleep an dis doing well.  10. Severe obesity (BMI  >= 40) (HCC) Weight is up 3 lbs. Wt Readings from Last 3 Encounters:  07/18/21 258 lb (117 kg)  06/06/21 255 lb (115.7 kg)  04/24/21 257 lb (116.6 kg)   BMI Readings from Last 3 Encounters:  07/18/21 39.23 kg/m  06/06/21 38.77 kg/m  04/24/21 39.08 kg/m       Outpatient Encounter Medications as of 07/18/2021  Medication Sig   allopurinol (ZYLOPRIM) 300 MG tablet Take 1 tablet (300 mg total) by mouth daily.   benazepril (LOTENSIN) 20 MG tablet Take 1 tablet (20 mg total) by mouth daily.   clonazePAM (KLONOPIN) 0.5 MG tablet Take 1 tablet (0.5 mg total) by mouth 2 (two) times daily as needed.   fish oil-omega-3 fatty acids 1000 MG capsule Take 2 g by mouth daily.   meclizine (ANTIVERT) 25 MG tablet TAKE 1/2 TO 1 (ONE-HALF TO ONE) TABLET BY MOUTH THREE TIMES DAILY AS NEEDED FOR DIZZINESS   metoprolol succinate (TOPROL-XL) 25 MG 24 hr tablet Take 1 tablet by mouth once daily   Multiple Vitamins-Minerals (COMPLETE MULTIVITAMIN/MINERAL PO) Take 1 tablet by mouth daily.   NIFEdipine (PROCARDIA-XL/NIFEDICAL-XL) 30 MG 24 hr tablet Take 1 tablet (30 mg total) by mouth daily.   Potassium 99 MG TABS Take  1 tablet (99 mg total) by mouth daily.   rosuvastatin (CRESTOR) 5 MG tablet Take 1 tablet (5 mg total) by mouth every other day.   traMADol (ULTRAM) 50 MG tablet Take 1 tablet (50 mg total) by mouth 2 (two) times daily.   traZODone (DESYREL) 50 MG tablet Take 0.5-1 tablets (25-50 mg total) by mouth at bedtime as needed. for sleep   furosemide (LASIX) 40 MG tablet Take 1 tablet (40 mg total) by mouth daily.   No facility-administered encounter medications on file as of 07/18/2021.    Past Surgical History:  Procedure Laterality Date   CARDIAC CATHETERIZATION N/A 06/27/2015   Procedure: Left Heart Cath and Coronary Angiography;  Surgeon: Troy Sine, MD;  Location: [facility];  Service: Cardiovascular;  Laterality: N/A;   HERNIA REPAIR     KIDNEY STONE SURGERY      Family  History  Problem Relation Age of Onset   Heart disease Father    Heart attack Father    Diabetes Sister    Heart disease Brother    Deep vein thrombosis Brother    Congestive Heart Failure Brother    Cancer Brother    Leukemia Brother    Heart attack Brother     New complaints: None today  Social history: Lives by hisself but has a girlfriend  Controlled substance contract: n/a     Review of Systems  Constitutional:  Negative for diaphoresis.  Eyes:  Negative for pain.  Respiratory:  Negative for shortness of breath.   Cardiovascular:  Negative for chest pain, palpitations and leg swelling.  Gastrointestinal:  Negative for abdominal pain.  Endocrine: Negative for polydipsia.  Skin:  Negative for rash.  Neurological:  Negative for dizziness, weakness and headaches.  Hematological:  Does not bruise/bleed easily.  All other systems reviewed and are negative.     Objective:   Physical Exam Vitals and nursing note reviewed.  Constitutional:      Appearance: Normal appearance. He is well-developed.  HENT:     Head: Normocephalic.     Nose: Nose normal.  Eyes:     Pupils: Pupils are equal, round, and reactive to light.  Neck:     Thyroid: No thyroid mass or thyromegaly.     Vascular: No carotid bruit or JVD.     Trachea: Phonation normal.  Cardiovascular:     Rate and Rhythm: Normal rate and regular rhythm.  Pulmonary:     Effort: Pulmonary effort is normal. No respiratory distress.     Breath sounds: Normal breath sounds.  Abdominal:     General: Bowel sounds are normal.     Palpations: Abdomen is soft.     Tenderness: There is no abdominal tenderness.  Musculoskeletal:        General: Normal range of motion.     Cervical back: Normal range of motion and neck supple.     Right lower leg: Edema (2+) present.     Left lower leg: Edema (2+) present.  Lymphadenopathy:     Cervical: No cervical adenopathy.  Skin:    General: Skin is warm and dry.   Neurological:     Mental Status: He is alert and oriented to person, place, and time.  Psychiatric:        Behavior: Behavior normal.        Thought Content: Thought content normal.        Judgment: Judgment normal.    BP 125/78   Pulse 70  Temp 97.9 F (36.6 C) (Temporal)   Resp 20   Ht '5\' 8"'$  (1.727 m)   Wt 258 lb (117 kg)   SpO2 94%   BMI 39.23 kg/m        Assessment & Plan:  PASCAL DEY comes in today with chief complaint of Medical Management of Chronic Issues   Diagnosis and orders addressed:  1. Primary hypertension Low sodium diet - metoprolol succinate (TOPROL-XL) 25 MG 24 hr tablet; Take 1 tablet (25 mg total) by mouth daily.  Dispense: 90 tablet; Refill: 1 - furosemide (LASIX) 40 MG tablet; Take 1 tablet (40 mg total) by mouth daily.  Dispense: 90 tablet; Refill: 3 - benazepril (LOTENSIN) 20 MG tablet; Take 1 tablet (20 mg total) by mouth daily.  Dispense: 90 tablet; Refill: 1  2. Hyperlipidemia with target LDL less than 100 Low fat diet - rosuvastatin (CRESTOR) 5 MG tablet; Take 1 tablet (5 mg total) by mouth every other day.  Dispense: 45 tablet; Refill: 1  3. Thoracic ascending aortic aneurysm [facility]) Will repeat scan in January 2023 - NIFEdipine (PROCARDIA-XL/NIFEDICAL-XL) 30 MG 24 hr tablet; Take 1 tablet (30 mg total) by mouth daily.  Dispense: 90 tablet; Refill: 1  4. Asymptomatic varicose veins of both lower extremities Wear compression soaks if can tolerate  5. Polyneuropathy Do not go bare footed - traMADol (ULTRAM) 50 MG tablet; Take 1 tablet (50 mg total) by mouth 2 (two) times daily.  Dispense: 60 tablet; Refill: 2  6. Benign prostatic hyperplasia without lower urinary tract symptoms Report any problems voiding  7. GAD (generalized anxiety disorder) Stress management - clonazePAM (KLONOPIN) 0.5 MG tablet; Take 1 tablet (0.5 mg total) by mouth 2 (two) times daily as needed.  Dispense: 60 tablet; Refill: 2  8. Hypokalemia Labs pending -  Potassium 99 MG TABS; Take 1 tablet (99 mg total) by mouth daily.  Dispense: 90 tablet; Refill: 1  9. Primary insomnia Bedtime routine - traZODone (DESYREL) 50 MG tablet; Take 0.5-1 tablets (25-50 mg total) by mouth at bedtime as needed. for sleep  Dispense: 90 tablet; Refill: 1  10. Severe obesity (BMI >= 40) (HCC) Discussed diet and exercise for person with BMI >25 Will recheck weight in 3-6 months    Labs pending Health Maintenance reviewed Diet and exercise encouraged  Follow up plan: 3 months   Mary-Margaret Hassell Done, FNP

## 2021-07-18 NOTE — Patient Instructions (Signed)
Textbook of family medicine (9th ed., pp. 1062-1073). [place], [place]: [employer].">  Stress, Adult Stress is a normal reaction to life events. Stress is what you feel when life demands more than you are used to, or more than you think you can handle. Some stress can be useful, such as studying for a test or meeting a deadline at work. Stress that occurs too often or for too long can cause problems. It can affect your emotional health and interfere with relationships and normal daily activities. Too much stress can weaken your body's defense system (immune system) and increase your risk for physical illness. If you already have a medicalproblem, stress can make it worse. What are the causes? All sorts of life events can cause stress. An event that causes stress for one person may not be stressful for another person. Major life events, whether positive or negative, commonly cause stress. Examples include: Losing a job or starting a new job. Losing a loved one. Moving to a new town or home. Getting married or divorced. Having a baby. Getting injured or sick. Less obvious life events can also cause stress, especially if they occur day after day or in combination with each other. Examples include: Working long hours. Driving in traffic. Caring for children. Being in debt. Being in a difficult relationship. What are the signs or symptoms? Stress can cause emotional symptoms, including: Anxiety. This is feeling worried, afraid, on edge, overwhelmed, or out of control. Anger, including irritation or impatience. Depression. This is feeling sad, down, helpless, or guilty. Trouble focusing, remembering, or making decisions. Stress can cause physical symptoms, including: Aches and pains. These may affect your head, neck, back, stomach, or other areas of your body. Tight muscles or a clenched jaw. Low energy. Trouble sleeping. Stress can cause unhealthy behaviors, including: Eating to feel better  (overeating) or skipping meals. Working too much or putting off tasks. Smoking, drinking alcohol, or using drugs to feel better. How is this diagnosed? Stress is diagnosed through an assessment by your health care provider. He or she may diagnose this condition based on: Your symptoms and any stressful life events. Your medical history. Tests to rule out other causes of your symptoms. Depending on your condition, your health care provider may refer you to aspecialist for further evaluation. How is this treated?  Stress management techniques are the recommended treatment for stress. Medicineis not typically recommended for the treatment of stress. Techniques to reduce your reaction to stressful life events include: Stress identification. Monitor yourself for symptoms of stress and identify what causes stress for you. These skills may help you to avoid or prepare for stressful events. Time management. Set your priorities, keep a calendar of events, and learn to say no. Taking these actions can help you avoid making too many commitments. Techniques for coping with stress include: Rethinking the problem. Try to think realistically about stressful events rather than ignoring them or overreacting. Try to find the positives in a stressful situation rather than focusing on the negatives. Exercise. Physical exercise can release both physical and emotional tension. The key is to find a form of exercise that you enjoy and do it regularly. Relaxation techniques. These relax the body and mind. The key is to find one or more that you enjoy and use the techniques regularly. Examples include: Meditation, deep breathing, or progressive relaxation techniques. Yoga or tai chi. Biofeedback, mindfulness techniques, or journaling. Listening to music, being out in nature, or participating in other hobbies. Practicing a healthy lifestyle.   Eat a balanced diet, drink plenty of water, limit or avoid caffeine, and get  plenty of sleep. Having a strong support network. Spend time with family, friends, or other people you enjoy being around. Express your feelings and talk things over with someone you trust. Counseling or talk therapy with a mental health professional may be helpful if you are havingtrouble managing stress on your own. Follow these instructions at home: Lifestyle  Avoid drugs. Do not use any products that contain nicotine or tobacco, such as cigarettes, e-cigarettes, and chewing tobacco. If you need help quitting, ask your health care provider. Limit alcohol intake to no more than 1 drink a day for nonpregnant women and 2 drinks a day for men. One drink equals 12 oz of beer, 5 oz of wine, or 1 oz of hard liquor Do not use alcohol or drugs to relax. Eat a balanced diet that includes fresh fruits and vegetables, whole grains, lean meats, fish, eggs, and beans, and low-fat dairy. Avoid processed foods and foods high in added fat, sugar, and salt. Exercise at least 30 minutes on 5 or more days each week. Get 7-8 hours of sleep each night.  General instructions  Practice stress management techniques as discussed with your health care provider. Drink enough fluid to keep your urine clear or pale yellow. Take over-the-counter and prescription medicines only as told by your health care provider. Keep all follow-up visits as told by your health care provider. This is important.  Contact a health care provider if: Your symptoms get worse. You have new symptoms. You feel overwhelmed by your problems and can no longer manage them on your own. Get help right away if: You have thoughts of hurting yourself or others. If you ever feel like you may hurt yourself or others, or have thoughts about taking your own life, get help right away. You can go to your nearest emergency department or call: Your local emergency services (911 in the [place] A suicide crisis helpline, such as the [facility] at 4253100524. This is open 24 hours a day. Summary Stress is a normal reaction to life events. It can cause problems if it happens too often or for too long. Practicing stress management techniques is the best way to treat stress. Counseling or talk therapy with a mental health professional may be helpful if you are having trouble managing stress on your own. This information is not intended to replace advice given to you by your health care provider. Make sure you discuss any questions you have with your healthcare provider. Document Revised: 08/03/2020 Document Reviewed: 08/03/2020 Elsevier Patient Education  2022 [employer].

## 2021-07-19 LAB — CMP14+EGFR
ALT: 19 IU/L (ref 0–44)
AST: 23 IU/L (ref 0–40)
Albumin/Globulin Ratio: 2 (ref 1.2–2.2)
Albumin: 4.3 g/dL (ref 3.7–4.7)
Alkaline Phosphatase: 59 IU/L (ref 44–121)
BUN/Creatinine Ratio: 13 (ref 10–24)
BUN: 11 mg/dL (ref 8–27)
Bilirubin Total: 0.2 mg/dL (ref 0.0–1.2)
CO2: 20 mmol/L (ref 20–29)
Calcium: 9.1 mg/dL (ref 8.6–10.2)
Chloride: 107 mmol/L — ABNORMAL HIGH (ref 96–106)
Creatinine, Ser: 0.88 mg/dL (ref 0.76–1.27)
Globulin, Total: 2.1 g/dL (ref 1.5–4.5)
Glucose: 109 mg/dL — ABNORMAL HIGH (ref 65–99)
Potassium: 4.5 mmol/L (ref 3.5–5.2)
Sodium: 140 mmol/L (ref 134–144)
Total Protein: 6.4 g/dL (ref 6.0–8.5)
eGFR: 88 mL/min/{1.73_m2} (ref >59)

## 2021-07-19 LAB — CBC WITH DIFFERENTIAL/PLATELET
Basophils Absolute: 0.1 10*3/uL (ref 0.0–0.2)
Basos: 1 %
EOS (ABSOLUTE): 0.2 10*3/uL (ref 0.0–0.4)
Eos: 3 %
Hematocrit: 41.6 % (ref 37.5–51.0)
Hemoglobin: 13.2 g/dL (ref 13.0–17.7)
Immature Grans (Abs): 0 10*3/uL (ref 0.0–0.1)
Immature Granulocytes: 0 %
Lymphocytes Absolute: 1.3 10*3/uL (ref 0.7–3.1)
Lymphs: 20 %
MCH: 32.4 pg (ref 26.6–33.0)
MCHC: 31.7 g/dL (ref 31.5–35.7)
MCV: 102 fL — ABNORMAL HIGH (ref 79–97)
Monocytes Absolute: 0.8 10*3/uL (ref 0.1–0.9)
Monocytes: 11 %
Neutrophils Absolute: 4.4 10*3/uL (ref 1.4–7.0)
Neutrophils: 65 %
Platelets: 208 10*3/uL (ref 150–450)
RBC: 4.07 x10E6/uL — ABNORMAL LOW (ref 4.14–5.80)
RDW: 13.5 % (ref 11.6–15.4)
WBC: 6.8 10*3/uL (ref 3.4–10.8)

## 2021-07-19 LAB — LIPID PANEL
Chol/HDL Ratio: 4.1 ratio (ref 0.0–5.0)
Cholesterol, Total: 145 mg/dL (ref 100–199)
HDL: 35 mg/dL — ABNORMAL LOW (ref >39)
LDL Chol Calc (NIH): 85 mg/dL (ref 0–99)
Triglycerides: 138 mg/dL (ref 0–149)
VLDL Cholesterol Cal: 25 mg/dL (ref 5–40)

## 2021-07-26 ENCOUNTER — Encounter: Payer: Self-pay | Admitting: Cardiology

## 2021-07-26 ENCOUNTER — Ambulatory Visit: Payer: Medicare HMO | Admitting: Cardiology

## 2021-07-26 ENCOUNTER — Other Ambulatory Visit: Payer: Self-pay

## 2021-07-26 VITALS — BP 142/82 | HR 80 | Ht 68.0 in | Wt 259.6 lb

## 2021-07-26 DIAGNOSIS — I712 Thoracic aortic aneurysm, without rupture: Secondary | ICD-10-CM | POA: Diagnosis not present

## 2021-07-26 DIAGNOSIS — I2510 Atherosclerotic heart disease of native coronary artery without angina pectoris: Secondary | ICD-10-CM

## 2021-07-26 DIAGNOSIS — I10 Essential (primary) hypertension: Secondary | ICD-10-CM

## 2021-07-26 DIAGNOSIS — I7121 Aneurysm of the ascending aorta, without rupture: Secondary | ICD-10-CM

## 2021-07-26 NOTE — Progress Notes (Signed)
Cardiology Office Note  Date: 07/26/2021   ID: Joseph [name], Joseph [name], Joseph [name], MRN [id number]  PCP:  Chevis Pretty, FNP  Cardiologist:  Rozann Lesches, MD Electrophysiologist:  None   Chief Complaint  Patient presents with   Cardiac follow-up    History of Present Illness: Joseph [name] is a 80 y.o. male last seen in February.  He is here for a routine visit.  From a cardiac perspective, he does not describe any chest pain.  He is functional with basic ADLs, still using a cane.  Denies any recent falls.  Follow-up chest CTA in July showed stable ascending thoracic aortic aneurysm measuring 4.7 cm.  He was seen by TCTS at that time with recommendation for a 1 year follow-up with reimaging.  I reviewed his medications which are stable from a cardiac perspective and outlined below.  He is tolerating low-dose Crestor every other day, last LDL was in the 80s.  He had myalgias on higher dose.  He continues to follow regularly with PCP.  Past Medical History:  Diagnosis Date   Anxiety    Ascending aortic aneurysm ([place])    Essential hypertension    GERD (gastroesophageal reflux disease)    Gout    History of kidney stones    Insomnia     Past Surgical History:  Procedure Laterality Date   CARDIAC CATHETERIZATION N/A 06/27/2015   Procedure: Left Heart Cath and Coronary Angiography;  Surgeon: Troy Sine, MD;  Location: [facility];  Service: Cardiovascular;  Laterality: N/A;   HERNIA REPAIR     KIDNEY STONE SURGERY      Current Outpatient Medications  Medication Sig Dispense Refill   allopurinol (ZYLOPRIM) 300 MG tablet Take 1 tablet (300 mg total) by mouth daily. 90 tablet 0   benazepril (LOTENSIN) 20 MG tablet Take 1 tablet (20 mg total) by mouth daily. 90 tablet 1   clonazePAM (KLONOPIN) 0.5 MG tablet Take 1 tablet (0.5 mg total) by mouth 2 (two) times daily as needed. 60 tablet 2   fish oil-omega-3 fatty acids 1000 MG capsule Take 2 g by mouth daily.      furosemide (LASIX) 40 MG tablet Take 1 tablet (40 mg total) by mouth daily. 90 tablet 3   meclizine (ANTIVERT) 25 MG tablet TAKE 1/2 TO 1 (ONE-HALF TO ONE) TABLET BY MOUTH THREE TIMES DAILY AS NEEDED FOR DIZZINESS 30 tablet 0   Melatonin 1 MG CHEW Chew by mouth.     metoprolol succinate (TOPROL-XL) 25 MG 24 hr tablet Take 1 tablet (25 mg total) by mouth daily. 90 tablet 1   Multiple Vitamins-Minerals (COMPLETE MULTIVITAMIN/MINERAL PO) Take 1 tablet by mouth daily.     NIFEdipine (PROCARDIA-XL/NIFEDICAL-XL) 30 MG 24 hr tablet Take 1 tablet (30 mg total) by mouth daily. 90 tablet 1   Potassium 99 MG TABS Take 1 tablet (99 mg total) by mouth daily. 90 tablet 1   rosuvastatin (CRESTOR) 5 MG tablet Take 1 tablet (5 mg total) by mouth every other day. 45 tablet 1   traMADol (ULTRAM) 50 MG tablet Take 1 tablet (50 mg total) by mouth 2 (two) times daily. 60 tablet 2   traZODone (DESYREL) 50 MG tablet Take 0.5-1 tablets (25-50 mg total) by mouth at bedtime as needed. for sleep 90 tablet 1   No current facility-administered medications for this visit.   Allergies:  Carafate [sucralfate], Ciprofloxacin hcl, Prevacid [lansoprazole], Quinolones, and Sulfa antibiotics   ROS: Intermittent constipation.  Describes  spells when his hands hurt when he is urinating.  Physical Exam: VS:  BP (!) 142/82   Pulse 80   Ht '5\' 8"'$  (1.727 m)   Wt 259 lb 9.6 oz (117.8 kg)   SpO2 95%   BMI 39.47 kg/m , BMI Body mass index is 39.47 kg/m.  Wt Readings from Last 3 Encounters:  07/26/21 259 lb 9.6 oz (117.8 kg)  07/18/21 258 lb (117 kg)  06/06/21 255 lb (115.7 kg)    General: Patient appears comfortable at rest. HEENT: Conjunctiva and lids normal, wearing a mask. Neck: Supple, no elevated JVP or carotid bruits, no thyromegaly. Lungs: Clear to auscultation, nonlabored breathing at rest. Cardiac: Regular rate and rhythm, no S3, 2/6 systolic murmur, no pericardial rub. Extremities: Stable appearing lower leg  edema.  ECG:  An ECG dated 05/31/2020 was personally reviewed today and demonstrated:  Sinus rhythm with leftward axis.  Recent Labwork: 10/18/2020: TSH 1.760 07/18/2021: ALT 19; AST 23; BUN 11; Creatinine, Ser 0.88; Hemoglobin 13.2; Platelets 208; Potassium 4.5; Sodium 140     Component Value Date/Time   CHOL 145 07/18/2021 1251   CHOL 208 (H) 03/22/2013 0928   TRIG 138 07/18/2021 1251   TRIG 142 04/18/2015 0942   TRIG 163 (H) 03/22/2013 0928   HDL 35 (L) 07/18/2021 1251   HDL 43 04/18/2015 0942   HDL 39 (L) 03/22/2013 0928   CHOLHDL 4.1 07/18/2021 1251   LDLCALC 85 07/18/2021 1251   LDLCALC 65 07/10/2014 0936   LDLCALC 136 (H) 03/22/2013 0928    Other Studies Reviewed Today:  Chest CTA 06/06/2021: IMPRESSION: Stable ascending thoracic aortic aneurysm measuring up to 4.7 cm. Recommend semi-annual imaging followup by CTA or MRA. This recommendation follows 2010 ACCF/AHA/AATS/ACR/ASA/SCA/SCAI/SIR/STS/SVM Guidelines for the Diagnosis and Management of Patients With Thoracic Aortic Disease. Circulation. 2010; [id number]. Aortic aneurysm NOS (ICD10-I71.9)   Aortic Atherosclerosis (ICD10-I70.0).   Echocardiogram 05/29/2020:  1. Left ventricular ejection fraction, by estimation, is 55 to 60%. The  left ventricle has normal function. The left ventricle has no regional  wall motion abnormalities. Left ventricular diastolic parameters are  consistent with Grade I diastolic  dysfunction (impaired relaxation).   2. Right ventricular systolic function is normal. The right ventricular  size is normal. There is normal pulmonary artery systolic pressure.   3. Left atrial size was severely dilated.   4. Right atrial size was mildly dilated.   5. The mitral valve is normal in structure. Mild mitral valve  regurgitation. No evidence of mitral stenosis.   6. The aortic valve is tricuspid. Aortic valve regurgitation is mild. No  aortic stenosis is present.   7. Aortic dilatation noted.  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   8. The inferior vena cava is normal in size with greater than 50%  respiratory variability, suggesting right atrial pressure of 3 mmHg.   Comparison(s): Echocardiogram done 06/05/15 showed an EF of 60%.  Assessment and Plan:  1.  Ascending thoracic aortic aneurysm, stable at 4.7 cm by recent chest CTA and asymptomatic.  Reimaging anticipated next July with clinical assessment at that time.  He had recent visit with TCTS.  2.  Nonobstructive CAD by cardiac catheterization in 2016.  We continue observation on low-dose statin therapy given history of statin myalgias.  ECG reviewed.  3.  Essential hypertension, continue Toprol-XL, Lotensin, and Procardia XL.  Keep follow-up with PCP.  Medication Adjustments/Labs and Tests Ordered: Current medicines are reviewed at length with  the patient today.  Concerns regarding medicines are outlined above.   Tests Ordered: Orders Placed This Encounter  Procedures   EKG [name]-Lead    Medication Changes: No orders of the defined types were placed in this encounter.   Disposition:  Follow up  1 year.  Signed, Satira Sark, MD, [facility] 07/26/2021 10:48 AM    [facility] [address] [place], [place], [place] [address] Phone: [phone]; Fax: [phone]

## 2021-07-26 NOTE — Patient Instructions (Addendum)
Medication Instructions:  Your physician recommends that you continue on your current medications as directed. Please refer to the Current Medication list given to you today.  Labwork: none  Testing/Procedures: Please have your follow up CTA chest & aorta in July 2023 as planned  Follow-Up: Your physician recommends that you schedule a follow-up appointment in: July 2023. You will receive a reminder call or letter in the mail in about 6 months reminding you to call and schedule your appointment. If you don't receive this letter, please contact our office.  Any Other Special Instructions Will Be Listed Below (If Applicable).  If you need a refill on your cardiac medications before your next appointment, please call your pharmacy.

## 2021-09-01 ENCOUNTER — Other Ambulatory Visit: Payer: Self-pay | Admitting: Nurse Practitioner

## 2021-09-01 DIAGNOSIS — M1A09X0 Idiopathic chronic gout, multiple sites, without tophus (tophi): Secondary | ICD-10-CM

## 2021-11-10 ENCOUNTER — Other Ambulatory Visit: Payer: Self-pay | Admitting: Nurse Practitioner

## 2021-11-10 DIAGNOSIS — G629 Polyneuropathy, unspecified: Secondary | ICD-10-CM

## 2021-11-13 ENCOUNTER — Other Ambulatory Visit: Payer: Self-pay | Admitting: Nurse Practitioner

## 2021-11-13 DIAGNOSIS — F411 Generalized anxiety disorder: Secondary | ICD-10-CM

## 2021-11-13 DIAGNOSIS — G629 Polyneuropathy, unspecified: Secondary | ICD-10-CM

## 2021-11-13 NOTE — Telephone Encounter (Signed)
Pt has appt with you tomorrow.

## 2021-11-14 ENCOUNTER — Ambulatory Visit (INDEPENDENT_AMBULATORY_CARE_PROVIDER_SITE_OTHER): Payer: Medicare HMO | Admitting: Nurse Practitioner

## 2021-11-14 ENCOUNTER — Encounter: Payer: Self-pay | Admitting: Nurse Practitioner

## 2021-11-14 VITALS — BP 123/63 | HR 73 | Temp 97.6°F | Resp 20 | Ht 68.0 in | Wt 260.0 lb

## 2021-11-14 DIAGNOSIS — E785 Hyperlipidemia, unspecified: Secondary | ICD-10-CM | POA: Diagnosis not present

## 2021-11-14 DIAGNOSIS — G629 Polyneuropathy, unspecified: Secondary | ICD-10-CM

## 2021-11-14 DIAGNOSIS — M1A09X0 Idiopathic chronic gout, multiple sites, without tophus (tophi): Secondary | ICD-10-CM

## 2021-11-14 DIAGNOSIS — I8393 Asymptomatic varicose veins of bilateral lower extremities: Secondary | ICD-10-CM

## 2021-11-14 DIAGNOSIS — I10 Essential (primary) hypertension: Secondary | ICD-10-CM

## 2021-11-14 DIAGNOSIS — E876 Hypokalemia: Secondary | ICD-10-CM | POA: Diagnosis not present

## 2021-11-14 DIAGNOSIS — N400 Enlarged prostate without lower urinary tract symptoms: Secondary | ICD-10-CM

## 2021-11-14 DIAGNOSIS — F411 Generalized anxiety disorder: Secondary | ICD-10-CM

## 2021-11-14 DIAGNOSIS — F5101 Primary insomnia: Secondary | ICD-10-CM

## 2021-11-14 DIAGNOSIS — I7121 Aneurysm of the ascending aorta, without rupture: Secondary | ICD-10-CM

## 2021-11-14 MED ORDER — ROSUVASTATIN CALCIUM 5 MG PO TABS: 5.0000 mg | ORAL_TABLET | ORAL | 1 refills | Status: DC

## 2021-11-14 MED ORDER — BENAZEPRIL HCL 20 MG PO TABS: 20.0000 mg | ORAL_TABLET | Freq: Every day | ORAL | 1 refills | Status: DC

## 2021-11-14 MED ORDER — METOPROLOL SUCCINATE ER 25 MG PO TB24: 25.0000 mg | ORAL_TABLET | Freq: Every day | ORAL | 1 refills | Status: DC

## 2021-11-14 MED ORDER — NIFEDIPINE ER OSMOTIC RELEASE 30 MG PO TB24: 30.0000 mg | ORAL_TABLET | Freq: Every day | ORAL | 1 refills | Status: DC

## 2021-11-14 MED ORDER — TRAZODONE HCL 50 MG PO TABS: 25.0000 mg | ORAL_TABLET | Freq: Every evening | ORAL | 1 refills | Status: DC | PRN

## 2021-11-14 MED ORDER — FUROSEMIDE 40 MG PO TABS: 40.0000 mg | ORAL_TABLET | Freq: Every day | ORAL | 3 refills | Status: DC

## 2021-11-14 MED ORDER — CLONAZEPAM 0.5 MG PO TABS: 0.5000 mg | ORAL_TABLET | Freq: Two times a day (BID) | ORAL | 2 refills | Status: DC | PRN

## 2021-11-14 MED ORDER — TRAMADOL HCL 50 MG PO TABS: 50.0000 mg | ORAL_TABLET | Freq: Two times a day (BID) | ORAL | 2 refills | Status: DC

## 2021-11-14 MED ORDER — ALLOPURINOL 300 MG PO TABS: 300.0000 mg | ORAL_TABLET | Freq: Every day | ORAL | 1 refills | Status: DC

## 2021-11-14 NOTE — Progress Notes (Signed)
Subjective:    Patient ID: Joseph [name], male    DOB: 03-27-1941, 80 y.o.   MRN: [id number]   Chief Complaint: medical management of chronic issues     HPI:  1. Primary hypertension No c/o chest pain, sob or headache. Does not check blood pressure at home.  BP Readings from Last 3 Encounters:  07/26/21 (!) 142/82  07/18/21 125/78  06/06/21 132/73     2. Hyperlipidemia with target LDL less than 100 Patient does not watch diet and does no exercise Lab Results  Component Value Date   CHOL 145 07/18/2021   HDL 35 (L) 07/18/2021   LDLCALC 85 07/18/2021   TRIG 138 07/18/2021   CHOLHDL 4.1 07/18/2021     3. Hypokalemia No lower ext cramping Lab Results  Component Value Date   K 4.5 07/18/2021     4. Aneurysm of ascending aorta without rupture Last ct scan was done 06/06/21 showed stable. He is to do semiannual imaging , so he will be due for repeat scan in January.  5. Polyneuropathy His feet stay numb all the time. He denis any sores on his feet  6. Asymptomatic varicose veins of both lower extremities Has large varicose veins bil lower ext. Denies any pain.  7. Benign prostatic hyperplasia without lower urinary tract symptoms No problems voiding Lab Results  Component Value Date   PSA1 2.4 01/18/2021   PSA1 3.3 06/29/2018   PSA1 3.0 02/20/2017   PSA 3.3 01/08/2015   PSA 3.4 10/05/2014   PSA 2.9 06/13/2014      8. Idiopathic chronic gout of multiple sites without tophus Denies any recent gout flare ups.  9. GAD (generalized anxiety disorder) Is on klonopin bid. Says he gets anxious if does not take. GAD 7 : Generalized Anxiety Score 11/14/2021 04/17/2021 01/18/2021 07/17/2020  Nervous, Anxious, on Edge 0 0 0 0  Control/stop worrying 0 0 0 0  Worry too much - different things 0 0 0 0  Trouble relaxing 0 0 0 0  Restless 0 0 0 0  Easily annoyed or irritable 0 0 0 0  Afraid - awful might happen 0 0 0 0  Total GAD 7 Score 0 0 0 0  Anxiety Difficulty Not  difficult at all Not difficult at all Not difficult at all Not difficult at all      10. Primary insomnia He is on trazadone to sleep at night. Sleeps about 8 hours a night.  11. Severe obesity (BMI >= 40) (HCC) No recent weight changes Wt Readings from Last 3 Encounters:  11/14/21 260 lb (117.9 kg)  07/26/21 259 lb 9.6 oz (117.8 kg)  07/18/21 258 lb (117 kg)    BMI Readings from Last 3 Encounters:  11/14/21 39.53 kg/m  07/26/21 39.47 kg/m  07/18/21 39.23 kg/m     Outpatient Encounter Medications as of 11/14/2021  Medication Sig   allopurinol (ZYLOPRIM) 300 MG tablet Take 1 tablet by mouth once daily   benazepril (LOTENSIN) 20 MG tablet Take 1 tablet (20 mg total) by mouth daily.   clonazePAM (KLONOPIN) 0.5 MG tablet Take 1 tablet (0.5 mg total) by mouth 2 (two) times daily as needed.   fish oil-omega-3 fatty acids 1000 MG capsule Take 2 g by mouth daily.   furosemide (LASIX) 40 MG tablet Take 1 tablet (40 mg total) by mouth daily.   meclizine (ANTIVERT) 25 MG tablet TAKE 1/2 TO 1 (ONE-HALF TO ONE) TABLET BY MOUTH THREE TIMES DAILY AS NEEDED  FOR DIZZINESS   Melatonin 1 MG CHEW Chew by mouth.   metoprolol succinate (TOPROL-XL) 25 MG 24 hr tablet Take 1 tablet (25 mg total) by mouth daily.   Multiple Vitamins-Minerals (COMPLETE MULTIVITAMIN/MINERAL PO) Take 1 tablet by mouth daily.   NIFEdipine (PROCARDIA-XL/NIFEDICAL-XL) 30 MG 24 hr tablet Take 1 tablet (30 mg total) by mouth daily.   Potassium 99 MG TABS Take 1 tablet (99 mg total) by mouth daily.   rosuvastatin (CRESTOR) 5 MG tablet Take 1 tablet (5 mg total) by mouth every other day.   traMADol (ULTRAM) 50 MG tablet Take 1 tablet (50 mg total) by mouth 2 (two) times daily.   traZODone (DESYREL) 50 MG tablet Take 0.5-1 tablets (25-50 mg total) by mouth at bedtime as needed. for sleep   No facility-administered encounter medications on file as of 11/14/2021.    Past Surgical History:  Procedure Laterality Date    CARDIAC CATHETERIZATION N/A 06/27/2015   Procedure: Left Heart Cath and Coronary Angiography;  Surgeon: Troy Sine, MD;  Location: [facility];  Service: Cardiovascular;  Laterality: N/A;   HERNIA REPAIR     KIDNEY STONE SURGERY      Family History  Problem Relation Age of Onset   Heart disease Father    Heart attack Father    Diabetes Sister    Heart disease Brother    Deep vein thrombosis Brother    Congestive Heart Failure Brother    Cancer Brother    Leukemia Brother    Heart attack Brother     New complaints: None today  Social history: Lives by hisself. Has a girlfriend he spends time with daily.  Controlled substance contract: 04/19/21     Review of Systems  Constitutional:  Negative for diaphoresis.  Eyes:  Negative for pain.  Respiratory:  Negative for shortness of breath.   Cardiovascular:  Negative for chest pain, palpitations and leg swelling.  Gastrointestinal:  Negative for abdominal pain.  Endocrine: Negative for polydipsia.  Skin:  Negative for rash.  Neurological:  Negative for dizziness, weakness and headaches.  Hematological:  Does not bruise/bleed easily.  All other systems reviewed and are negative.     Objective:   Physical Exam Vitals and nursing note reviewed.  Constitutional:      Appearance: Normal appearance. He is well-developed.  HENT:     Head: Normocephalic.     Nose: Nose normal.     Mouth/Throat:     Mouth: Mucous membranes are moist.     Pharynx: Oropharynx is clear.  Eyes:     Pupils: Pupils are equal, round, and reactive to light.  Neck:     Thyroid: No thyroid mass or thyromegaly.     Vascular: No carotid bruit or JVD.     Trachea: Phonation normal.  Cardiovascular:     Rate and Rhythm: Normal rate and regular rhythm.  Pulmonary:     Effort: Pulmonary effort is normal. No respiratory distress.     Breath sounds: Normal breath sounds.  Abdominal:     General: Bowel sounds are normal.     Palpations:  Abdomen is soft.     Tenderness: There is no abdominal tenderness.  Musculoskeletal:        General: Normal range of motion.     Cervical back: Normal range of motion and neck supple.     Comments: Rises slowly from sitting to standing Had trouble getting exam table  Lymphadenopathy:     Cervical: No cervical  adenopathy.  Skin:    General: Skin is warm and dry.  Neurological:     Mental Status: He is alert and oriented to person, place, and time.  Psychiatric:        Behavior: Behavior normal.        Thought Content: Thought content normal.        Judgment: Judgment normal.   BP 123/63    Pulse 73    Temp 97.6 F (36.4 C) (Temporal)    Resp 20    Ht '5\' 8"'  (1.727 m)    Wt 260 lb (117.9 kg)    SpO2 93%    BMI 39.53 kg/m         Assessment & Plan:  KENTAVIUS DETTORE comes in today with chief complaint of Medical Management of Chronic Issues   Diagnosis and orders addressed:  1. Primary hypertension Low sodium diet - furosemide (LASIX) 40 MG tablet; Take 1 tablet (40 mg total) by mouth daily.  Dispense: 90 tablet; Refill: 3 - metoprolol succinate (TOPROL-XL) 25 MG 24 hr tablet; Take 1 tablet (25 mg total) by mouth daily.  Dispense: 90 tablet; Refill: 1 - benazepril (LOTENSIN) 20 MG tablet; Take 1 tablet (20 mg total) by mouth daily.  Dispense: 90 tablet; Refill: 1 - CBC with Differential/Platelet - CMP14+EGFR  2. Hyperlipidemia with target LDL less than 100 Low fat diet - rosuvastatin (CRESTOR) 5 MG tablet; Take 1 tablet (5 mg total) by mouth every other day.  Dispense: 45 tablet; Refill: 1 - Lipid panel  3. Hypokalemia Labs pending  4. 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  Dispense: 90 tablet; Refill: 1  5. Polyneuropathy Do not go barefooted Check feet daily - traMADol (ULTRAM) 50 MG tablet; Take 1 tablet (50 mg total) by mouth 2  (two) times daily.  Dispense: 60 tablet; Refill: 2  6. Asymptomatic varicose veins of both lower extremities  7. Benign prostatic hyperplasia without lower urinary tract symptoms Report any problems voiding  8. Idiopathic chronic gout of multiple sites without tophus - allopurinol (ZYLOPRIM) 300 MG tablet; Take 1 tablet (300 mg total) by mouth daily.  Dispense: 90 tablet; Refill: 1  9. GAD (generalized anxiety disorder) Stress management - clonazePAM (KLONOPIN) 0.5 MG tablet; Take 1 tablet (0.5 mg total) by mouth 2 (two) times daily as needed.  Dispense: 60 tablet; Refill: 2  10. Primary insomnia Bedtime routine - traZODone (DESYREL) 50 MG tablet; Take 0.5-1 tablets (25-50 mg total) by mouth at bedtime as needed. for sleep  Dispense: 90 tablet; Refill: 1  11. Severe obesity (BMI >= 40) (HCC) Discussed diet and exercise for person with BMI >25 Will recheck weight in 3-6 months    Labs pending Health Maintenance reviewed Diet and exercise encouraged  Follow up plan: 3 months   Mary-Margaret Hassell Done, FNP

## 2021-11-14 NOTE — Patient Instructions (Signed)

## 2021-11-15 LAB — LIPID PANEL
Chol/HDL Ratio: 3.7 ratio (ref 0.0–5.0)
Cholesterol, Total: 145 mg/dL (ref 100–199)
HDL: 39 mg/dL — ABNORMAL LOW (ref >39)
LDL Chol Calc (NIH): 84 mg/dL (ref 0–99)
Triglycerides: 122 mg/dL (ref 0–149)
VLDL Cholesterol Cal: 22 mg/dL (ref 5–40)

## 2021-11-15 LAB — CMP14+EGFR
ALT: 22 IU/L (ref 0–44)
AST: 21 IU/L (ref 0–40)
Albumin/Globulin Ratio: 1.6 (ref 1.2–2.2)
Albumin: 4.1 g/dL (ref 3.7–4.7)
Alkaline Phosphatase: 67 IU/L (ref 44–121)
BUN/Creatinine Ratio: 12 (ref 10–24)
BUN: 12 mg/dL (ref 8–27)
Bilirubin Total: 0.2 mg/dL (ref 0.0–1.2)
CO2: 21 mmol/L (ref 20–29)
Calcium: 9.3 mg/dL (ref 8.6–10.2)
Chloride: 107 mmol/L — ABNORMAL HIGH (ref 96–106)
Creatinine, Ser: 1.02 mg/dL (ref 0.76–1.27)
Globulin, Total: 2.6 g/dL (ref 1.5–4.5)
Glucose: 105 mg/dL — ABNORMAL HIGH (ref 70–99)
Potassium: 4.3 mmol/L (ref 3.5–5.2)
Sodium: 143 mmol/L (ref 134–144)
Total Protein: 6.7 g/dL (ref 6.0–8.5)
eGFR: 75 mL/min/{1.73_m2} (ref >59)

## 2021-11-15 LAB — CBC WITH DIFFERENTIAL/PLATELET
Basophils Absolute: 0.1 10*3/uL (ref 0.0–0.2)
Basos: 1 %
EOS (ABSOLUTE): 0.4 10*3/uL (ref 0.0–0.4)
Eos: 4 %
Hematocrit: 43.7 % (ref 37.5–51.0)
Hemoglobin: 14.4 g/dL (ref 13.0–17.7)
Immature Grans (Abs): 0.1 10*3/uL (ref 0.0–0.1)
Immature Granulocytes: 1 %
Lymphocytes Absolute: 0.9 10*3/uL (ref 0.7–3.1)
Lymphs: 10 %
MCH: 32.9 pg (ref 26.6–33.0)
MCHC: 33 g/dL (ref 31.5–35.7)
MCV: 100 fL — ABNORMAL HIGH (ref 79–97)
Monocytes Absolute: 1.4 10*3/uL — ABNORMAL HIGH (ref 0.1–0.9)
Monocytes: 15 %
Neutrophils Absolute: 6.1 10*3/uL (ref 1.4–7.0)
Neutrophils: 69 %
Platelets: 219 10*3/uL (ref 150–450)
RBC: 4.38 x10E6/uL (ref 4.14–5.80)
RDW: 13 % (ref 11.6–15.4)
WBC: 8.8 10*3/uL (ref 3.4–10.8)

## 2022-01-20 ENCOUNTER — Ambulatory Visit: Payer: Medicare HMO | Admitting: Nurse Practitioner

## 2022-02-10 ENCOUNTER — Other Ambulatory Visit: Payer: Self-pay | Admitting: Nurse Practitioner

## 2022-02-10 DIAGNOSIS — G629 Polyneuropathy, unspecified: Secondary | ICD-10-CM

## 2022-02-10 DIAGNOSIS — F411 Generalized anxiety disorder: Secondary | ICD-10-CM

## 2022-02-14 ENCOUNTER — Encounter: Payer: Self-pay | Admitting: Nurse Practitioner

## 2022-02-14 ENCOUNTER — Ambulatory Visit (INDEPENDENT_AMBULATORY_CARE_PROVIDER_SITE_OTHER): Payer: Medicare HMO | Admitting: Nurse Practitioner

## 2022-02-14 VITALS — BP 126/80 | HR 89 | Temp 98.4°F | Resp 20 | Ht 68.0 in | Wt 261.0 lb

## 2022-02-14 DIAGNOSIS — G629 Polyneuropathy, unspecified: Secondary | ICD-10-CM

## 2022-02-14 DIAGNOSIS — Z23 Encounter for immunization: Secondary | ICD-10-CM

## 2022-02-14 DIAGNOSIS — E785 Hyperlipidemia, unspecified: Secondary | ICD-10-CM | POA: Diagnosis not present

## 2022-02-14 DIAGNOSIS — I8393 Asymptomatic varicose veins of bilateral lower extremities: Secondary | ICD-10-CM | POA: Diagnosis not present

## 2022-02-14 DIAGNOSIS — F5101 Primary insomnia: Secondary | ICD-10-CM

## 2022-02-14 DIAGNOSIS — M1A09X0 Idiopathic chronic gout, multiple sites, without tophus (tophi): Secondary | ICD-10-CM

## 2022-02-14 DIAGNOSIS — I10 Essential (primary) hypertension: Secondary | ICD-10-CM | POA: Diagnosis not present

## 2022-02-14 DIAGNOSIS — N400 Enlarged prostate without lower urinary tract symptoms: Secondary | ICD-10-CM | POA: Diagnosis not present

## 2022-02-14 DIAGNOSIS — E876 Hypokalemia: Secondary | ICD-10-CM | POA: Diagnosis not present

## 2022-02-14 DIAGNOSIS — F411 Generalized anxiety disorder: Secondary | ICD-10-CM | POA: Diagnosis not present

## 2022-02-14 DIAGNOSIS — R609 Edema, unspecified: Secondary | ICD-10-CM

## 2022-02-14 DIAGNOSIS — R600 Localized edema: Secondary | ICD-10-CM

## 2022-02-14 MED ORDER — BENAZEPRIL HCL 20 MG PO TABS: 20.0000 mg | ORAL_TABLET | Freq: Every day | ORAL | 1 refills | Status: DC

## 2022-02-14 MED ORDER — POTASSIUM 99 MG PO TABS: 1.0000 | ORAL_TABLET | Freq: Every day | ORAL | 1 refills | Status: DC

## 2022-02-14 MED ORDER — CLONAZEPAM 0.5 MG PO TABS: 0.5000 mg | ORAL_TABLET | Freq: Two times a day (BID) | ORAL | 2 refills | Status: DC | PRN

## 2022-02-14 MED ORDER — FUROSEMIDE 40 MG PO TABS: 40.0000 mg | ORAL_TABLET | Freq: Every day | ORAL | 3 refills | Status: DC

## 2022-02-14 MED ORDER — ALLOPURINOL 300 MG PO TABS: 300.0000 mg | ORAL_TABLET | Freq: Every day | ORAL | 1 refills | Status: DC

## 2022-02-14 MED ORDER — METOPROLOL SUCCINATE ER 25 MG PO TB24: 25.0000 mg | ORAL_TABLET | Freq: Every day | ORAL | 1 refills | Status: DC

## 2022-02-14 MED ORDER — TRAMADOL HCL 50 MG PO TABS: 50.0000 mg | ORAL_TABLET | Freq: Two times a day (BID) | ORAL | 2 refills | Status: DC

## 2022-02-14 MED ORDER — ROSUVASTATIN CALCIUM 5 MG PO TABS: 5.0000 mg | ORAL_TABLET | ORAL | 1 refills | Status: DC

## 2022-02-14 MED ORDER — TRAZODONE HCL 50 MG PO TABS: 25.0000 mg | ORAL_TABLET | Freq: Every evening | ORAL | 1 refills | Status: DC | PRN

## 2022-02-14 NOTE — Progress Notes (Signed)
? ?Subjective:  ? ? Patient ID: Joseph [name], male    DOB: Aug 31, 1941, 81 y.o.   MRN: [id number] ? ?Chief Complaint: Medical Management of Chronic Issues (Joint pain and pitting edema in legs. Multiple falls and having right shoulder pain) ?  ? ?HPI: ? ?Joseph [name] is a 81 y.o. who identifies as a male who was assigned male at birth.  ? ?Social history: ?Lives with: by himself ?Work history: retired ? ? ?Comes in today for follow up of the following chronic medical issues: ? ?1. Primary hypertension ?No c/o chest pain, sob or headache. Does not check blood pressure at home. ?BP Readings from Last 3 Encounters:  ?02/14/22 126/80  ?11/14/21 123/63  ?07/26/21 (!) 142/82  ? ? ? ?2. Hypokalemia ?No c/o of muscle cramping ?Lab Results  ?Component Value Date  ? K 4.3 11/14/2021  ? ? ? ?3. Hyperlipidemia with target LDL less than 100 ?Does not watch diet and does no dedicated exercise ?Lab Results  ?Component Value Date  ? CHOL 145 11/14/2021  ? HDL 39 (L) 11/14/2021  ? [place] 84 11/14/2021  ? TRIG 122 11/14/2021  ? CHOLHDL 3.7 11/14/2021  ? ? ? ?4. Asymptomatic varicose veins of both lower extremities ?Doe snot really bother him ? ?5. Polyneuropathy ?Has constant burning and tingling in bil feet. Denies any sores or lesions. ? ?6. Benign prostatic hyperplasia without lower urinary tract symptoms ? ?Lab Results  ?Component Value Date  ? PSA1 2.4 01/18/2021  ? PSA1 3.3 06/29/2018  ? PSA1 3.0 02/20/2017  ? PSA 3.3 01/08/2015  ? PSA 3.4 10/05/2014  ? PSA 2.9 06/13/2014  ? ? ? ?7. GAD (generalized anxiety disorder) ?Has lots of anxiety. Is on klonopin BID ?GAD 7 : Generalized Anxiety Score 02/14/2022 11/14/2021 04/17/2021 01/18/2021  ?Nervous, Anxious, on Edge 0 0 0 0  ?Control/stop worrying 0 0 0 0  ?Worry too much - different things 0 0 0 0  ?Trouble relaxing 0 0 0 0  ?Restless 0 0 0 0  ?Easily annoyed or irritable 0 0 0 0  ?Afraid - awful might happen 0 0 0 0  ?Total GAD 7 Score 0 0 0 0  ?Anxiety Difficulty Not difficult at  all Not difficult at all Not difficult at all Not difficult at all  ? ? ? ? ?8. Primary insomnia ?Is on trazadone to sleep. Sleeps about 87-8 hours a niht most nights. ? ?9. Idiopathic chronic gout of multiple sites without tophus ?No recent flare ups ? ?10. Severe obesity (BMI >= 40) (HCC) ?No recent weight changes ?Wt Readings from Last 3 Encounters:  ?02/14/22 261 lb (118.4 kg)  ?11/14/21 260 lb (117.9 kg)  ?07/26/21 259 lb 9.6 oz (117.8 kg)  ? ?BMI Readings from Last 3 Encounters:  ?02/14/22 39.68 kg/m?  ?11/14/21 39.53 kg/m?  ?07/26/21 39.47 kg/m?  ? ? ? ? ?New complaints: ?- lower ext edema seems to be worsening ?- has falling 3 times this year. Says he just looses his balance. Last fall was 2 months ago. Has never inured himself. ?- right shoulder pain nthat comes nad goes. ? ?Allergies  ?Allergen Reactions  ? Carafate [Sucralfate] Other (See Comments)  ?  TOOK SKIN FROM HIS MOUTH  ? Ciprofloxacin Hcl Other (See Comments)  ?  CAN'T REMEMBER  ? Prevacid [Lansoprazole] Other (See Comments)  ?  MADE HIM FEEL BAD  ? Quinolones   ?  Patient was warned about not using Cipro and similar antibiotics. ?Recent studies  have raised concern that fluoroquinolone antibiotics could be associated with an increased risk of aortic aneurysm ?Fluoroquinolones have non-antimicrobial properties that might jeopardise the integrity of the extracellular matrix of the vascular wall ?In a  propensity score matched cohort study in [place], there was a 66% increased rate of aortic aneurysm or dissection associated with oral fluoroquinolone use, compared wit  ? Sulfa Antibiotics Diarrhea  ?  SEVERE  ? ?Outpatient Encounter Medications as of 02/14/2022  ?Medication Sig  ? allopurinol (ZYLOPRIM) 300 MG tablet Take 1 tablet (300 mg total) by mouth daily.  ? benazepril (LOTENSIN) 20 MG tablet Take 1 tablet (20 mg total) by mouth daily.  ? clonazePAM (KLONOPIN) 0.5 MG tablet Take 1 tablet (0.5 mg total) by mouth 2 (two) times daily as needed.  ?  fish oil-omega-3 fatty acids 1000 MG capsule Take 2 g by mouth daily.  ? meclizine (ANTIVERT) 25 MG tablet TAKE 1/2 TO 1 (ONE-HALF TO ONE) TABLET BY MOUTH THREE TIMES DAILY AS NEEDED FOR DIZZINESS  ? Melatonin 1 MG CHEW Chew by mouth.  ? metoprolol succinate (TOPROL-XL) 25 MG 24 hr tablet Take 1 tablet (25 mg total) by mouth daily.  ? Multiple Vitamins-Minerals (COMPLETE MULTIVITAMIN/MINERAL PO) Take 1 tablet by mouth daily.  ? NIFEdipine (PROCARDIA-XL/NIFEDICAL-XL) 30 MG 24 hr tablet Take 1 tablet (30 mg total) by mouth daily.  ? Potassium 99 MG TABS Take 1 tablet (99 mg total) by mouth daily.  ? rosuvastatin (CRESTOR) 5 MG tablet Take 1 tablet (5 mg total) by mouth every other day.  ? traMADol (ULTRAM) 50 MG tablet Take 1 tablet (50 mg total) by mouth 2 (two) times daily.  ? traZODone (DESYREL) 50 MG tablet Take 0.5-1 tablets (25-50 mg total) by mouth at bedtime as needed. for sleep  ? furosemide (LASIX) 40 MG tablet Take 1 tablet (40 mg total) by mouth daily.  ? ?No facility-administered encounter medications on file as of 02/14/2022.  ? ? ?Past Surgical History:  ?Procedure Laterality Date  ? CARDIAC CATHETERIZATION N/A 06/27/2015  ? Procedure: Left Heart Cath and Coronary Angiography;  Surgeon: Troy Sine, MD;  Location: [facility];  Service: Cardiovascular;  Laterality: N/A;  ? HERNIA REPAIR    ? KIDNEY STONE SURGERY    ? ? ?Family History  ?Problem Relation Age of Onset  ? Heart disease Father   ? Heart attack Father   ? Diabetes Sister   ? Heart disease Brother   ? Deep vein thrombosis Brother   ? Congestive Heart Failure Brother   ? Cancer Brother   ? Leukemia Brother   ? Heart attack Brother   ? ? ? ? ?Controlled substance contract: 02/14/22 ? ? ? ? ?Review of Systems  ?Constitutional:  Negative for diaphoresis.  ?Eyes:  Negative for pain.  ?Respiratory:  Negative for shortness of breath.   ?Cardiovascular:  Negative for chest pain, palpitations and leg swelling.  ?Gastrointestinal:  Negative for  abdominal pain.  ?Endocrine: Negative for polydipsia.  ?Skin:  Negative for rash.  ?Neurological:  Negative for dizziness, weakness and headaches.  ?Hematological:  Does not bruise/bleed easily.  ?All other systems reviewed and are negative. ? ?   ?Objective:  ? Physical Exam ?Vitals and nursing note reviewed.  ?Constitutional:   ?   Appearance: Normal appearance. He is well-developed.  ?HENT:  ?   Head: Normocephalic.  ?   Nose: Nose normal.  ?   Mouth/Throat:  ?   Mouth: Mucous membranes are moist.  ?  Pharynx: Oropharynx is clear.  ?Eyes:  ?   Pupils: Pupils are equal, round, and reactive to light.  ?Neck:  ?   Thyroid: No thyroid mass or thyromegaly.  ?   Vascular: No carotid bruit or JVD.  ?   Trachea: Phonation normal.  ?Cardiovascular:  ?   Rate and Rhythm: Normal rate and regular rhythm.  ?Pulmonary:  ?   Effort: Pulmonary effort is normal. No respiratory distress.  ?   Breath sounds: Normal breath sounds.  ?Abdominal:  ?   General: Bowel sounds are normal.  ?   Palpations: Abdomen is soft.  ?   Tenderness: There is no abdominal tenderness.  ?Musculoskeletal:     ?   General: Normal range of motion.  ?   Cervical back: Normal range of motion and neck supple.  ?   Right lower leg: Edema (1+) present.  ?   Left lower leg: Edema (1+) present.  ?   Comments: From of right shoulder without pain  ?Lymphadenopathy:  ?   Cervical: No cervical adenopathy.  ?Skin: ?   General: Skin is warm and dry.  ?Neurological:  ?   Mental Status: He is alert and oriented to person, place, and time.  ?Psychiatric:     ?   Behavior: Behavior normal.     ?   Thought Content: Thought content normal.     ?   Judgment: Judgment normal.  ? ? ?BP 126/80   Pulse 89   Temp 98.4 ?F (36.9 ?C) (Temporal)   Resp 20   Ht '5\' 8"'$  (1.727 m)   Wt 261 lb (118.4 kg)   SpO2 94%   BMI 39.68 kg/m?  ? ? ? ?   ?Assessment & Plan:  ?Joseph [name] comes in today with chief complaint of Medical Management of Chronic Issues (Joint pain and pitting edema  in legs. Multiple falls and having right shoulder pain) ? ? ?Diagnosis and orders addressed: ? ?1. Primary hypertension ?Low sodium diet ?- furosemide (LASIX) 40 MG tablet; Take 1 tablet (40 mg total) by mouth

## 2022-02-14 NOTE — Addendum Note (Signed)
Addended by: Rolena Infante on: 02/14/2022 01:42 PM ? ? Modules accepted: Orders ? ?

## 2022-02-14 NOTE — Patient Instructions (Signed)

## 2022-02-15 LAB — CMP14+EGFR
ALT: 20 IU/L (ref 0–44)
AST: 22 IU/L (ref 0–40)
Albumin/Globulin Ratio: 1.8 (ref 1.2–2.2)
Albumin: 4.2 g/dL (ref 3.7–4.7)
Alkaline Phosphatase: 64 IU/L (ref 44–121)
BUN/Creatinine Ratio: 15 (ref 10–24)
BUN: 13 mg/dL (ref 8–27)
Bilirubin Total: 0.3 mg/dL (ref 0.0–1.2)
CO2: 19 mmol/L — ABNORMAL LOW (ref 20–29)
Calcium: 9.4 mg/dL (ref 8.6–10.2)
Chloride: 105 mmol/L (ref 96–106)
Creatinine, Ser: 0.89 mg/dL (ref 0.76–1.27)
Globulin, Total: 2.4 g/dL (ref 1.5–4.5)
Glucose: 105 mg/dL — ABNORMAL HIGH (ref 70–99)
Potassium: 4.7 mmol/L (ref 3.5–5.2)
Sodium: 141 mmol/L (ref 134–144)
Total Protein: 6.6 g/dL (ref 6.0–8.5)
eGFR: 87 mL/min/{1.73_m2} (ref >59)

## 2022-02-15 LAB — CBC WITH DIFFERENTIAL/PLATELET
Basophils Absolute: 0.1 10*3/uL (ref 0.0–0.2)
Basos: 1 %
EOS (ABSOLUTE): 0.2 10*3/uL (ref 0.0–0.4)
Eos: 2 %
Hematocrit: 44.6 % (ref 37.5–51.0)
Hemoglobin: 14.8 g/dL (ref 13.0–17.7)
Immature Grans (Abs): 0.1 10*3/uL (ref 0.0–0.1)
Immature Granulocytes: 1 %
Lymphocytes Absolute: 1.2 10*3/uL (ref 0.7–3.1)
Lymphs: 15 %
MCH: 33.7 pg — ABNORMAL HIGH (ref 26.6–33.0)
MCHC: 33.2 g/dL (ref 31.5–35.7)
MCV: 102 fL — ABNORMAL HIGH (ref 79–97)
Monocytes Absolute: 0.7 10*3/uL (ref 0.1–0.9)
Monocytes: 9 %
Neutrophils Absolute: 5.7 10*3/uL (ref 1.4–7.0)
Neutrophils: 72 %
Platelets: 304 10*3/uL (ref 150–450)
RBC: 4.39 x10E6/uL (ref 4.14–5.80)
RDW: 14.1 % (ref 11.6–15.4)
WBC: 7.9 10*3/uL (ref 3.4–10.8)

## 2022-02-15 LAB — LIPID PANEL
Chol/HDL Ratio: 3.7 ratio (ref 0.0–5.0)
Cholesterol, Total: 154 mg/dL (ref 100–199)
HDL: 42 mg/dL (ref >39)
LDL Chol Calc (NIH): 87 mg/dL (ref 0–99)
Triglycerides: 140 mg/dL (ref 0–149)
VLDL Cholesterol Cal: 25 mg/dL (ref 5–40)

## 2022-02-20 LAB — TOXASSURE SELECT 13 (MW), URINE

## 2022-04-25 ENCOUNTER — Ambulatory Visit (INDEPENDENT_AMBULATORY_CARE_PROVIDER_SITE_OTHER): Payer: Medicare HMO

## 2022-04-25 ENCOUNTER — Ambulatory Visit (INDEPENDENT_AMBULATORY_CARE_PROVIDER_SITE_OTHER): Payer: Medicare HMO | Admitting: *Deleted

## 2022-04-25 VITALS — Wt 260.0 lb

## 2022-04-25 DIAGNOSIS — Z0000 Encounter for general adult medical examination without abnormal findings: Secondary | ICD-10-CM

## 2022-04-25 DIAGNOSIS — Z23 Encounter for immunization: Secondary | ICD-10-CM

## 2022-04-25 NOTE — Progress Notes (Signed)
Subjective:   Joseph [name] is a 81 y.o. male who presents for Medicare Annual/Subsequent preventive examination.  Virtual Visit via Telephone Note  I connected with  ADRYEN COOKSON on 04/25/22 at  9:45 AM EDT by telephone and verified that I am speaking with the correct person using two identifiers.  Location: Patient: Home Provider: [facility] Persons participating in the virtual visit: patient/Nurse Health Advisor   I discussed the limitations, risks, security and privacy concerns of performing an evaluation and management service by telephone and the availability of in person appointments. The patient expressed understanding and agreed to proceed.  Interactive audio and video telecommunications were attempted between this nurse and patient, however failed, due to patient having technical difficulties OR patient did not have access to video capability.  We continued and completed visit with audio only.  Some vital signs may be absent or patient reported.   Dotty Gonzalo E Braven Wolk, LPN   Review of Systems     Cardiac Risk Factors include: advanced age (>29mn, >>91women);obesity (BMI >30kg/m2);sedentary lifestyle;dyslipidemia;hypertension;Other (see comment), Risk factor comments: aortic aneurysm     Objective:    Today's Vitals   04/25/22 0945  Weight: 260 lb (117.9 kg)   Body mass index is 39.53 kg/m.     04/25/2022    9:51 AM 04/24/2021   10:06 AM 08/05/2019   10:33 AM 08/03/2018   12:20 PM 06/27/2015    8:32 AM 01/08/2015   10:46 AM 10/05/2014   10:49 AM  Advanced Directives  Does Patient Have a Medical Advance Directive? No Yes Yes Yes No No No  Type of ACorporate treasurerof APaac Ciin[place]Living will H[place]arm[place] will [place]Living will     Does patient want to make changes to medical advance directive?   No - Patient declined No - Patient declined   Yes - information given  Copy of [place] Chart?  No - copy  requested No - copy requested No - copy requested     Would patient like information on creating a medical advance directive? No - Patient declined    No - patient declined information Yes - Educational materials given Yes - EScientist, clinical (histocompatibility and immunogenetics)given    Current Medications (verified) Outpatient Encounter Medications as of 04/25/2022  Medication Sig   allopurinol (ZYLOPRIM) 300 MG tablet Take 1 tablet (300 mg total) by mouth daily.   benazepril (LOTENSIN) 20 MG tablet Take 1 tablet (20 mg total) by mouth daily.   clonazePAM (KLONOPIN) 0.5 MG tablet Take 1 tablet (0.5 mg total) by mouth 2 (two) times daily as needed.   fish oil-omega-3 fatty acids 1000 MG capsule Take 2 g by mouth daily.   furosemide (LASIX) 40 MG tablet Take 1 tablet (40 mg total) by mouth daily.   Melatonin 1 MG CHEW Chew by mouth.   metoprolol succinate (TOPROL-XL) 25 MG 24 hr tablet Take 1 tablet (25 mg total) by mouth daily.   Multiple Vitamins-Minerals (COMPLETE MULTIVITAMIN/MINERAL PO) Take 1 tablet by mouth daily.   NIFEdipine (PROCARDIA-XL/NIFEDICAL-XL) 30 MG 24 hr tablet Take 1 tablet (30 mg total) by mouth daily.   Potassium 99 MG TABS Take 1 tablet (99 mg total) by mouth daily.   rosuvastatin (CRESTOR) 5 MG tablet Take 1 tablet (5 mg total) by mouth every other day.   traMADol (ULTRAM) 50 MG tablet Take 1 tablet (50 mg total) by mouth 2 (two) times daily.   traZODone (DESYREL) 50  MG tablet Take 0.5-1 tablets (25-50 mg total) by mouth at bedtime as needed. for sleep   meclizine (ANTIVERT) 25 MG tablet TAKE 1/2 TO 1 (ONE-HALF TO ONE) TABLET BY MOUTH THREE TIMES DAILY AS NEEDED FOR DIZZINESS (Patient not taking: Reported on 04/25/2022)   No facility-administered encounter medications on file as of 04/25/2022.    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[place])    Essential hypertension    GERD (gastroesophageal reflux disease)    Gout    History of kidney stones    Insomnia    Past Surgical History:  Procedure Laterality Date   CARDIAC CATHETERIZATION N/A 06/27/2015   Procedure: Left Heart Cath and Coronary Angiography;  Surgeon: Troy Sine, MD;  Location: [facility];  Service: Cardiovascular;  Laterality: N/A;   HERNIA REPAIR     KIDNEY STONE SURGERY     Family History  Problem Relation Age of Onset   Heart disease Father    Heart attack Father    Diabetes Sister    Heart disease Brother    Deep vein thrombosis Brother    Congestive Heart Failure Brother    Cancer Brother    Leukemia Brother    Heart attack Brother    Social History   Socioeconomic History   Marital status: Significant Other    Spouse name: Not on file   Number of children: 2   Years of education: 10   Highest education level: 10th grade  Occupational History   Occupation: Retired    Comment: Runner, broadcasting/film/video and [employer]  Tobacco Use   Smoking status: Former    Packs/day: 0.50    Years: 1.00    Pack years: 0.50    Types: Cigarettes    Quit date: 03/22/1958    Years since quitting: 64.1   Smokeless tobacco: Former    Types: Chew   Tobacco comments:    [place]  Vaping Use   Vaping Use: Never used  Substance and Sexual Activity   Alcohol use: No    Alcohol/week: 0.0 standard drinks   Drug use: No   Sexual activity: Not on file  Other Topics Concern   Not on file  Social History Narrative   Lives alone, but sometimes Herma Ard, significant other stays with him; He has 2 daughters who visit often   Right handed    Caffeine use: none   Social Determinants of Health   Financial Resource Strain: Low Risk    Difficulty of Paying Living Expenses: Not hard at all  Food Insecurity: No Food Insecurity   Worried About Charity fundraiser in the Last Year: Never true   [place] in the Last  Year: Never true  Transportation Needs: No Transportation Needs   Lack of Transportation (Medical): No   Lack of Transportation (Non-Medical): No  Physical Activity: Insufficiently Active   Days of Exercise per Week: 7 days   Minutes of Exercise per Session: 20 min  Stress: No Stress Concern Present   Feeling of Stress : Only a little  Social Connections: Engineer, building services of Communication with Friends and Family: More than three times a week   Frequency of Social Gatherings with Friends and Family: More than three times a week   Attends Religious Services: More than 4  times per year   Active Member of Clubs or Organizations: Yes   Attends Music therapist: More than 4 times per year   Marital Status: Living with partner    Tobacco Counseling Counseling given: Not Answered Tobacco comments: QUIT CHEWING IN 1989   Clinical Intake:  Pre-visit preparation completed: Yes  Pain : No/denies pain     BMI - recorded: 39.53 Nutritional Status: BMI > 30  Obese Nutritional Risks: None Diabetes: No  How often do you need to have someone help you when you read instructions, pamphlets, or other written materials from your doctor or pharmacy?: 1 - Never  Diabetic? no  Interpreter Needed?: No  Information entered by :: Shaleena Crusoe, LPN   Activities of Daily Living    04/25/2022    9:52 AM  In your present state of health, do you have any difficulty performing the following activities:  Hearing? 1  Vision? 0  Difficulty concentrating or making decisions? 0  Walking or climbing stairs? 0  Dressing or bathing? 0  Doing errands, shopping? 0  Preparing Food and eating ? N  Using the Toilet? N  In the past six months, have you accidently leaked urine? N  Do you have problems with loss of bowel control? N  Managing your Medications? N  Managing your Finances? N  Housekeeping or managing your Housekeeping? N    Patient Care Team: Chevis Pretty, FNP as PCP - General (Nurse Practitioner) Satira Sark, MD as PCP - Cardiology (Cardiology) Harlen Labs, MD as Referring Physician (Optometry)  Indicate any recent Medical Services you may have received from other than [facility] providers in the past year (date may be approximate).     Assessment:   This is a routine wellness examination for [place].  Hearing/Vision screen Hearing Screening - Comments:: Moderate hearing difficulties  Vision Screening - Comments:: Wears rx glasses - up to date with routine eye exams with [facility]  Dietary issues and exercise activities discussed: Current Exercise Habits: Home exercise routine, Type of exercise: walking;stretching, Time (Minutes): 20, Frequency (Times/Week): 7, Weekly Exercise (Minutes/Week): 140, Intensity: Mild, Exercise limited by: orthopedic condition(s);neurologic condition(s)   Goals Addressed             This Visit's Progress    Exercise 150 min/wk Moderate Activity   On track      Depression Screen    04/25/2022    9:50 AM 02/14/2022   12:35 PM 11/14/2021    8:14 AM 04/24/2021    9:52 AM 04/17/2021   10:40 AM 01/18/2021    9:58 AM 10/18/2020   10:00 AM  PHQ 2/9 Scores  PHQ - 2 Score 0 0 0 0 0 0 0  PHQ- 9 Score  0 2 3 0      Fall Risk    04/25/2022    9:47 AM 02/14/2022   12:35 PM 11/14/2021    8:11 AM 07/18/2021   11:32 AM 04/24/2021   10:05 AM  Fall Risk   Falls in the past year? '1 1 1 1 1  '$ Number falls in past yr: '1 1 1 '$ 0 1  Injury with Fall? 1 0 0 0 0  Risk for fall due to : History of fall(s);Impaired balance/gait;Orthopedic patient;Other (Comment) History of fall(s) History of fall(s) History of fall(s) Impaired balance/gait;Orthopedic patient;History of fall(s);Impaired vision;Medication side effect  Risk for fall due to: Comment neuropathy      Follow up Education provided;Falls prevention discussed  Education provided Retail buyer  provided;Falls prevention discussed    FALL RISK PREVENTION PERTAINING TO THE HOME:  Any stairs in or around the home? Yes  If so, are there any without handrails? No  Home free of loose throw rugs in walkways, pet beds, electrical cords, etc? Yes  Adequate lighting in your home to reduce risk of falls? Yes   ASSISTIVE DEVICES UTILIZED TO PREVENT FALLS:  Life alert? No  Use of a cane, walker or w/c? Yes  Grab bars in the bathroom? Yes  Shower chair or bench in shower? Yes  Elevated toilet seat or a handicapped toilet? Yes   TIMED UP AND GO:  Was the test performed? No . Telephonic visit  Cognitive Function:    08/03/2018   12:34 PM  MMSE - Mini Mental State Exam  Orientation to time 5  Orientation to Place 5  Registration 3  Attention/ Calculation 4  Recall 3  Language- name 2 objects 2  Language- repeat 1  Language- follow 3 step command 3  Language- read & follow direction 1  Write a sentence 1  Copy design 1  Total score 29        04/25/2022    9:49 AM 08/05/2019   10:34 AM  6CIT Screen  What Year? 0 points 0 points  What month? 0 points 0 points  What time? 0 points 0 points  Count back from 20 0 points 0 points  Months in reverse 0 points 4 points  Repeat phrase 2 points 0 points  Total Score 2 points 4 points    Immunizations Immunization History  Administered Date(s) Administered   Influenza, High Dose Seasonal PF 09/11/2017, 10/07/2019   Influenza,inj,Quad PF,6+ Mos 10/05/2014, 10/30/2015, 08/28/2016, 10/01/2018   Influenza-Unspecified 08/31/2020, 08/31/2021   Moderna Sars-Covid-2 Vaccination 12/20/2019, 01/17/2020, 10/31/2020   Pneumococcal Conjugate-13 04/18/2015   Pneumococcal Polysaccharide-23 01/02/2014, 02/02/2018   Pneumococcal-Unspecified 02/02/2018   Tdap 10/05/2014   Zoster Recombinat (Shingrix) 02/14/2022   Zoster, Live 10/30/2015    TDAP status: Up to date  Flu Vaccine status: Up to date  Pneumococcal vaccine status: Up to  date  Covid-19 vaccine status: Completed vaccines  Qualifies for Shingles Vaccine? Yes   Zostavax completed Yes   Shingrix Completed?: Yes  Screening Tests Health Maintenance  Topic Date Due   COVID-19 Vaccine (4 - Booster for Moderna series) 12/26/2020   Zoster Vaccines- Shingrix (2 of 2) 04/11/2022   Hepatitis C Screening  07/18/2022 (Originally 12/22/1958)   INFLUENZA VACCINE  07/01/2022   TETANUS/TDAP  10/05/2024   Pneumonia Vaccine 77+ Years old  Completed   HPV VACCINES  Aged Out    Health Maintenance  Health Maintenance Due  Topic Date Due   COVID-19 Vaccine (4 - Booster for Moderna series) 12/26/2020   Zoster Vaccines- Shingrix (2 of 2) 04/11/2022    Colorectal cancer screening: No longer required.   Lung Cancer Screening: (Low Dose CT Chest recommended if Age 60-80 years, 30 pack-year currently smoking OR have quit w/in 15years.) does not qualify.   Additional Screening:  Hepatitis C Screening: does not qualify  Vision Screening: Recommended annual ophthalmology exams for early detection of glaucoma and other disorders of the eye. Is the patient up to date with their annual eye exam?  Yes  Who is the provider or what is the name of the office in which the patient attends annual eye exams? [facility] If pt is not established with a provider, would they  like to be referred to a provider to establish care? No .   Dental Screening: Recommended annual dental exams for proper oral hygiene  Community Resource Referral / Chronic Care Management: CRR required this visit?  No   CCM required this visit?  No      Plan:     I have personally reviewed and noted the following in the patient's chart:   Medical and social history Use of alcohol, tobacco or illicit drugs  Current medications and supplements including opioid prescriptions. Patient is not currently taking opioid prescriptions. Functional ability and status Nutritional status Physical  activity Advanced directives List of other physicians Hospitalizations, surgeries, and ER visits in previous 12 months Vitals Screenings to include cognitive, depression, and falls Referrals and appointments  In addition, I have reviewed and discussed with patient certain preventive protocols, quality metrics, and best practice recommendations. A written personalized care plan for preventive services as well as general preventive health recommendations were provided to patient.     Sandrea Hammond, LPN   [id number]   Nurse Notes: None

## 2022-04-25 NOTE — Patient Instructions (Signed)
Joseph [name] , Thank you for taking time to come for your Medicare Wellness Visit. I appreciate your ongoing commitment to your health goals. Please review the following plan we discussed and let me know if I can assist you in the future.   Screening recommendations/referrals: Colonoscopy: No longer required Recommended yearly ophthalmology/optometry visit for glaucoma screening and checkup Recommended yearly dental visit for hygiene and checkup  Vaccinations: Influenza vaccine: Done 08/31/2021 - antihistamine  Pneumococcal vaccine: Done 01/02/2014 & 02/02/2018 Tdap vaccine: Done 10/05/2014 - Repeat in 10 years Shingles vaccine: Done 02/14/2022 - getting second dose today  -04/25/2022 Covid-19: Done  12/20/2019, 01/17/2020, & 10/31/2020  Advanced directives: Please bring a copy of your health care power of attorney and living will to the office to be added to your chart at your convenience.   Conditions/risks identified: Aim for 30 minutes of exercise or brisk walking, 6-8 glasses of water, and 5 servings of fruits and vegetables each day.   Next appointment: Follow up in one year for your annual wellness visit.   Preventive Care 9 Years and Older, Male  Preventive care refers to lifestyle choices and visits with your health care provider that can promote health and wellness. What does preventive care include? A yearly physical exam. This is also called an annual well check. Dental exams once or twice a year. Routine eye exams. Ask your health care provider how often you should have your eyes checked. Personal lifestyle choices, including: Daily care of your teeth and gums. Regular physical activity. Eating a healthy diet. Avoiding tobacco and drug use. Limiting alcohol use. Practicing safe sex. Taking low doses of aspirin every day. Taking vitamin and mineral supplements as recommended by your health care provider. What happens during an annual well check? The services and screenings done by  your health care provider during your annual well check will depend on your age, overall health, lifestyle risk factors, and family history of disease. Counseling  Your health care provider may ask you questions about your: Alcohol use. Tobacco use. Drug use. Emotional well-being. Home and relationship well-being. Sexual activity. Eating habits. History of falls. Memory and ability to understand (cognition). Work and work Statistician. Screening  You may have the following tests or measurements: Height, weight, and BMI. Blood pressure. Lipid and cholesterol levels. These may be checked every 5 years, or more frequently if you are over 48 years old. Skin check. Lung cancer screening. You may have this screening every year starting at age 59 if you have a 30-pack-year history of smoking and currently smoke or have quit within the past 15 years. Fecal occult blood test (FOBT) of the stool. You may have this test every year starting at age 73. Flexible sigmoidoscopy or colonoscopy. You may have a sigmoidoscopy every 5 years or a colonoscopy every 10 years starting at age 66. Prostate cancer screening. Recommendations will vary depending on your family history and other risks. Hepatitis C blood test. Hepatitis B blood test. Sexually transmitted disease (STD) testing. Diabetes screening. This is done by checking your blood sugar (glucose) after you have not eaten for a while (fasting). You may have this done every 1-3 years. Abdominal aortic aneurysm (AAA) screening. You may need this if you are a current or former smoker. Osteoporosis. You may be screened starting at age 3 if you are at high risk. Talk with your health care provider about your test results, treatment options, and if necessary, the need for more tests. Vaccines  Your health care  provider may recommend certain vaccines, such as: Influenza vaccine. This is recommended every year. Tetanus, diphtheria, and acellular pertussis  (Tdap, Td) vaccine. You may need a Td booster every 10 years. Zoster vaccine. You may need this after age 64. Pneumococcal 13-valent conjugate (PCV13) vaccine. One dose is recommended after age 47. Pneumococcal polysaccharide (PPSV23) vaccine. One dose is recommended after age 15. Talk to your health care provider about which screenings and vaccines you need and how often you need them. This information is not intended to replace advice given to you by your health care provider. Make sure you discuss any questions you have with your health care provider. Document Released: 12/14/2015 Document Revised: 08/06/2016 Document Reviewed: 09/18/2015 Elsevier Interactive Patient Education  2017 [place] Prevention in the Home Falls can cause injuries. They can happen to people of all ages. There are many things you can do to make your home safe and to help prevent falls. What can I do on the outside of my home? Regularly fix the edges of walkways and driveways and fix any cracks. Remove anything that might make you trip as you walk through a door, such as a raised step or threshold. Trim any bushes or trees on the path to your home. Use bright outdoor lighting. Clear any walking paths of anything that might make someone trip, such as rocks or tools. Regularly check to see if handrails are loose or broken. Make sure that both sides of any steps have handrails. Any raised decks and porches should have guardrails on the edges. Have any leaves, snow, or ice cleared regularly. Use sand or salt on walking paths during winter. Clean up any spills in your garage right away. This includes oil or grease spills. What can I do in the bathroom? Use night lights. Install grab bars by the toilet and in the tub and shower. Do not use towel bars as grab bars. Use non-skid mats or decals in the tub or shower. If you need to sit down in the shower, use a plastic, non-slip stool. Keep the floor dry. Clean  up any water that spills on the floor as soon as it happens. Remove soap buildup in the tub or shower regularly. Attach bath mats securely with double-sided non-slip rug tape. Do not have throw rugs and other things on the floor that can make you trip. What can I do in the bedroom? Use night lights. Make sure that you have a light by your bed that is easy to reach. Do not use any sheets or blankets that are too big for your bed. They should not hang down onto the floor. Have a firm chair that has side arms. You can use this for support while you get dressed. Do not have throw rugs and other things on the floor that can make you trip. What can I do in the kitchen? Clean up any spills right away. Avoid walking on wet floors. Keep items that you use a lot in easy-to-reach places. If you need to reach something above you, use a strong step stool that has a grab bar. Keep electrical cords out of the way. Do not use floor polish or wax that makes floors slippery. If you must use wax, use non-skid floor wax. Do not have throw rugs and other things on the floor that can make you trip. What can I do with my stairs? Do not leave any items on the stairs. Make sure that there are handrails on both  sides of the stairs and use them. Fix handrails that are broken or loose. Make sure that handrails are as long as the stairways. Check any carpeting to make sure that it is firmly attached to the stairs. Fix any carpet that is loose or worn. Avoid having throw rugs at the top or bottom of the stairs. If you do have throw rugs, attach them to the floor with carpet tape. Make sure that you have a light switch at the top of the stairs and the bottom of the stairs. If you do not have them, ask someone to add them for you. What else can I do to help prevent falls? Wear shoes that: Do not have high heels. Have rubber bottoms. Are comfortable and fit you well. Are closed at the toe. Do not wear sandals. If you  use a stepladder: Make sure that it is fully opened. Do not climb a closed stepladder. Make sure that both sides of the stepladder are locked into place. Ask someone to hold it for you, if possible. Clearly mark and make sure that you can see: Any grab bars or handrails. First and last steps. Where the edge of each step is. Use tools that help you move around (mobility aids) if they are needed. These include: Canes. Walkers. Scooters. Crutches. Turn on the lights when you go into a dark area. Replace any light bulbs as soon as they burn out. Set up your furniture so you have a clear path. Avoid moving your furniture around. If any of your floors are uneven, fix them. If there are any pets around you, be aware of where they are. Review your medicines with your doctor. Some medicines can make you feel dizzy. This can increase your chance of falling. Ask your doctor what other things that you can do to help prevent falls. This information is not intended to replace advice given to you by your health care provider. Make sure you discuss any questions you have with your health care provider. Document Released: 09/13/2009 Document Revised: 04/24/2016 Document Reviewed: 12/22/2014 Elsevier Interactive Patient Education  2017 [employer].

## 2022-05-05 ENCOUNTER — Other Ambulatory Visit: Payer: Self-pay | Admitting: Cardiothoracic Surgery

## 2022-05-05 DIAGNOSIS — I7121 Aneurysm of the ascending aorta, without rupture: Secondary | ICD-10-CM

## 2022-06-10 ENCOUNTER — Other Ambulatory Visit: Payer: Self-pay | Admitting: Nurse Practitioner

## 2022-06-10 DIAGNOSIS — F411 Generalized anxiety disorder: Secondary | ICD-10-CM

## 2022-06-14 ENCOUNTER — Other Ambulatory Visit: Payer: Self-pay | Admitting: Nurse Practitioner

## 2022-06-14 DIAGNOSIS — F411 Generalized anxiety disorder: Secondary | ICD-10-CM

## 2022-06-16 ENCOUNTER — Other Ambulatory Visit: Payer: Medicare HMO

## 2022-06-16 ENCOUNTER — Ambulatory Visit: Payer: Medicare HMO | Admitting: Cardiothoracic Surgery

## 2022-06-16 NOTE — Telephone Encounter (Signed)
Refill refused-ntbs for refill on controlled substance with PCP

## 2022-06-25 ENCOUNTER — Other Ambulatory Visit: Payer: Self-pay | Admitting: Nurse Practitioner

## 2022-06-25 DIAGNOSIS — F411 Generalized anxiety disorder: Secondary | ICD-10-CM

## 2022-06-30 ENCOUNTER — Other Ambulatory Visit: Payer: Medicare HMO

## 2022-06-30 ENCOUNTER — Ambulatory Visit: Payer: Medicare HMO | Admitting: Cardiothoracic Surgery

## 2022-07-28 ENCOUNTER — Ambulatory Visit: Payer: Medicare HMO | Admitting: Cardiothoracic Surgery

## 2022-07-28 ENCOUNTER — Ambulatory Visit
Admission: RE | Admit: 2022-07-28 | Discharge: 2022-07-28 | Disposition: A | Discharge status: Home / Self Care | Payer: Medicare HMO | Source: Ambulatory Visit | Attending: Cardiothoracic Surgery | Admitting: Cardiothoracic Surgery

## 2022-07-28 DIAGNOSIS — I7121 Aneurysm of the ascending aorta, without rupture: Secondary | ICD-10-CM | POA: Diagnosis not present

## 2022-07-28 DIAGNOSIS — I700 Atherosclerosis of aorta: Secondary | ICD-10-CM | POA: Diagnosis not present

## 2022-07-28 DIAGNOSIS — I3139 Other pericardial effusion (noninflammatory): Secondary | ICD-10-CM | POA: Diagnosis not present

## 2022-07-29 ENCOUNTER — Other Ambulatory Visit: Payer: Self-pay | Admitting: Nurse Practitioner

## 2022-07-29 ENCOUNTER — Ambulatory Visit: Payer: Medicare HMO | Admitting: Cardiothoracic Surgery

## 2022-07-29 DIAGNOSIS — M314 Aortic arch syndrome [Takayasu]: Secondary | ICD-10-CM

## 2022-07-29 DIAGNOSIS — F411 Generalized anxiety disorder: Secondary | ICD-10-CM

## 2022-07-29 NOTE — Progress Notes (Signed)
Patient ID: Joseph [name], male   DOB: 11/21/1941, 81 y.o.   MRN: [id number]      [address]       [place] [address]             [phone]     CARDIOTHORACIC SURGERY TELEPHONE VIRTUAL OFFICE NOTE  Referring Provider is Hinda Lenis Primary Cardiologist is Rozann Lesches, MD PCP is Chevis Pretty, FNP   HPI:  I spoke with Joseph [name] (DOB [birth year] ) via telephone on 07/29/2022 at 5:11 PM and verified that I was speaking with the correct person using more than one form of identification.  We discussed the reason(s) for conducting our visit virtually instead of in-person.  The patient expressed understanding the circumstances and agreed to proceed as described.  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  I have reviewed the images of the CT scan which shows a stable 4.7 cm fusiform aneurysm.  Lung fields show no suspicious nodules and mediastinal adenopathy is minimal. I look for blood pressure recordings in the patient's medical records but can find no face-to-face visits when vital signs were taken.  Despite calling several times there was no answer so I left a voicemail message regarding the results of the CT scan showing no change that the patient does not need surgery does not meet criteria for surgery and that we will set up a CT scan of the chest for surveillance of his thoracic aneurysm in 1 year.    Current Outpatient Medications  Medication Sig Dispense Refill   allopurinol (ZYLOPRIM) 300 MG tablet Take 1 tablet (300 mg total) by mouth daily. 90 tablet 1   benazepril (LOTENSIN) 20 MG tablet Take 1 tablet (20 mg total) by mouth daily. 90 tablet 1   clonazePAM (KLONOPIN) 0.5 MG tablet Take 1 tablet by mouth twice daily as needed 60 tablet 0   fish oil-omega-3 fatty acids 1000 MG capsule Take 2 g by mouth  daily.     furosemide (LASIX) 40 MG tablet Take 1 tablet (40 mg total) by mouth daily. 90 tablet 3   meclizine (ANTIVERT) 25 MG tablet TAKE 1/2 TO 1 (ONE-HALF TO ONE) TABLET BY MOUTH THREE TIMES DAILY AS NEEDED FOR DIZZINESS (Patient not taking: Reported on 04/25/2022) 30 tablet 0   Melatonin 1 MG CHEW Chew by mouth.     metoprolol succinate (TOPROL-XL) 25 MG 24 hr tablet Take 1 tablet (25 mg total) by mouth daily. 90 tablet 1   Multiple Vitamins-Minerals (COMPLETE MULTIVITAMIN/MINERAL PO) Take 1 tablet by mouth daily.     NIFEdipine (PROCARDIA-XL/NIFEDICAL-XL) 30 MG 24 hr tablet Take 1 tablet (30 mg total) by mouth daily. 90 tablet 1   Potassium 99 MG TABS Take 1 tablet (99 mg total) by mouth daily. 90 tablet 1   rosuvastatin (CRESTOR) 5 MG tablet Take 1 tablet (5 mg total) by mouth every other day. 45 tablet 1   traMADol (ULTRAM) 50 MG tablet Take 1 tablet (50 mg total) by mouth 2 (two) times daily. 60 tablet 2   traZODone (DESYREL) 50 MG tablet Take 0.5-1 tablets (25-50 mg total) by mouth at bedtime as needed. for sleep 90 tablet 1   No current facility-administered medications for this visit.     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 Therapy is continued good blood pressure control to keep systolic blood pressure less than 140  Plan:  Patient will be scheduled for follow-up CT scan of chest without contrast in 1 year.    I discussed limitations of evaluation and management via telephone.  The patient was advised to call back for repeat telephone consultation or to seek an in-person evaluation if questions arise or the patient's clinical condition changes in any significant manner.  I spent in excess of 2 minutes of non-face-to-face time during the conduct of this telephone virtual office  consultation.  Level 1  (99441)             5-10 minutes Level 2  (99442)            11-20 minutes Level 3  (99443)            21-30 minutes    07/29/2022 5:11 PM

## 2022-07-30 MED ORDER — CLONAZEPAM 0.5 MG PO TABS: 0.5000 mg | ORAL_TABLET | Freq: Two times a day (BID) | ORAL | 0 refills | Status: DC | PRN

## 2022-07-30 NOTE — Addendum Note (Signed)
Addended by: Antonietta Barcelona D on: 07/30/2022 02:48 PM   Modules accepted: Orders

## 2022-07-30 NOTE — Telephone Encounter (Signed)
LMOVM refill has been sent to pharmacy 

## 2022-08-18 ENCOUNTER — Ambulatory Visit (INDEPENDENT_AMBULATORY_CARE_PROVIDER_SITE_OTHER): Payer: Medicare HMO | Admitting: Nurse Practitioner

## 2022-08-18 ENCOUNTER — Encounter: Payer: Self-pay | Admitting: Nurse Practitioner

## 2022-08-18 VITALS — BP 128/63 | HR 73 | Temp 98.1°F | Resp 20 | Ht 68.0 in | Wt 264.0 lb

## 2022-08-18 DIAGNOSIS — I7121 Aneurysm of the ascending aorta, without rupture: Secondary | ICD-10-CM | POA: Diagnosis not present

## 2022-08-18 DIAGNOSIS — E876 Hypokalemia: Secondary | ICD-10-CM | POA: Diagnosis not present

## 2022-08-18 DIAGNOSIS — R600 Localized edema: Secondary | ICD-10-CM

## 2022-08-18 DIAGNOSIS — I10 Essential (primary) hypertension: Secondary | ICD-10-CM | POA: Diagnosis not present

## 2022-08-18 DIAGNOSIS — F5101 Primary insomnia: Secondary | ICD-10-CM

## 2022-08-18 DIAGNOSIS — R609 Edema, unspecified: Secondary | ICD-10-CM

## 2022-08-18 DIAGNOSIS — N400 Enlarged prostate without lower urinary tract symptoms: Secondary | ICD-10-CM

## 2022-08-18 DIAGNOSIS — G629 Polyneuropathy, unspecified: Secondary | ICD-10-CM | POA: Diagnosis not present

## 2022-08-18 DIAGNOSIS — I8393 Asymptomatic varicose veins of bilateral lower extremities: Secondary | ICD-10-CM | POA: Diagnosis not present

## 2022-08-18 DIAGNOSIS — M1A09X0 Idiopathic chronic gout, multiple sites, without tophus (tophi): Secondary | ICD-10-CM

## 2022-08-18 DIAGNOSIS — I7300 Raynaud's syndrome without gangrene: Secondary | ICD-10-CM

## 2022-08-18 DIAGNOSIS — E785 Hyperlipidemia, unspecified: Secondary | ICD-10-CM

## 2022-08-18 DIAGNOSIS — Z23 Encounter for immunization: Secondary | ICD-10-CM

## 2022-08-18 DIAGNOSIS — F411 Generalized anxiety disorder: Secondary | ICD-10-CM

## 2022-08-18 MED ORDER — TRAMADOL HCL 50 MG PO TABS: 50.0000 mg | ORAL_TABLET | Freq: Two times a day (BID) | ORAL | 2 refills | Status: DC

## 2022-08-18 MED ORDER — POTASSIUM 99 MG PO TABS: 1.0000 | ORAL_TABLET | Freq: Every day | ORAL | 1 refills | Status: DC

## 2022-08-18 MED ORDER — TRAZODONE HCL 50 MG PO TABS: 25.0000 mg | ORAL_TABLET | Freq: Every evening | ORAL | 1 refills | Status: DC | PRN

## 2022-08-18 MED ORDER — CLONAZEPAM 0.5 MG PO TABS: 0.5000 mg | ORAL_TABLET | Freq: Two times a day (BID) | ORAL | 0 refills | Status: DC | PRN

## 2022-08-18 MED ORDER — NIFEDIPINE ER OSMOTIC RELEASE 30 MG PO TB24: 30.0000 mg | ORAL_TABLET | Freq: Every day | ORAL | 1 refills | Status: DC

## 2022-08-18 MED ORDER — ROSUVASTATIN CALCIUM 5 MG PO TABS: 5.0000 mg | ORAL_TABLET | ORAL | 1 refills | Status: DC

## 2022-08-18 MED ORDER — METOPROLOL SUCCINATE ER 25 MG PO TB24: 25.0000 mg | ORAL_TABLET | Freq: Every day | ORAL | 1 refills | Status: DC

## 2022-08-18 MED ORDER — BENAZEPRIL HCL 20 MG PO TABS: 20.0000 mg | ORAL_TABLET | Freq: Every day | ORAL | 1 refills | Status: DC

## 2022-08-18 MED ORDER — FUROSEMIDE 40 MG PO TABS: 40.0000 mg | ORAL_TABLET | Freq: Every day | ORAL | 1 refills | Status: DC

## 2022-08-18 MED ORDER — ALLOPURINOL 300 MG PO TABS: 300.0000 mg | ORAL_TABLET | Freq: Every day | ORAL | 1 refills | Status: DC

## 2022-08-18 NOTE — Progress Notes (Signed)
Subjective:    Patient ID: Joseph [name], male    DOB: 07-07-1941, 81 y.o.   MRN: [id number]   Chief Complaint: medical management of chronic issues     HPI:  Joseph [name] is a 81 y.o. who identifies as a male who was assigned male at birth.   Social history: Lives with: by himself since his wife passed. He has a girlfriend that he spends a lot of time with her. Work history: retired   Scientist, forensic in today for follow up of the following chronic medical issues:  1. Primary hypertension No c/o chest pain, sob or headache. Doe sot check bloodpressure at home. BP Readings from Last 3 Encounters:  02/14/22 126/80  11/14/21 123/63  07/26/21 (!) 142/82     2. Hyperlipidemia with target LDL less than 100 Does not watch diet and doe sno dedicated exercise. Lab Results  Component Value Date   CHOL 154 02/14/2022   HDL 42 02/14/2022   LDLCALC 87 02/14/2022   TRIG 140 02/14/2022   CHOLHDL 3.7 02/14/2022  The 10-year ASCVD risk score (Arnett DK, et al., 2019) is: 35.1%    3. Hypokalemia Lab Results  Component Value Date   K 4.7 02/14/2022     4. Aneurysm of ascending aorta without rupture (HCC) Saw DR. Vantrigt on 07/29/22. At last check aneurysm was stable and he is to recehcek in 1 year.  5. Raynaud's disease without gangrene Doing well right ow. Usually only bothers him in winter months. He denies any sores on fingers or toes.  6. Asymptomatic varicose veins of both lower extremities Not bothering him.  7. Polyneuropathy Has burning of bil feet all the time Pain assessment: Cause of pain- polyneuropathy Pain location- bil feet and lower back Pain on scale of 1-10- 5/10 currently Frequency- daily What increases pain-nothing that he is aware of What makes pain Better-rest helps Effects on ADL - none Any change in general medical condition-none  Current opioids rx- ultram bid # meds rx- 60 Effectiveness of current meds-helps Adverse reactions from pain  meds-none Morphine equivalent- 10MME  Pill count performed-no Last drug screen - 02/14/22 ( high risk q24m moderate risk q628mlow risk yearly ) Urine drug screen today- No [place]  If yes were their any concerning findings? - no   Overdose risk: 1    07/05/2019   12:01 PM  Opioid Risk   Alcohol 0  Illegal Drugs 0  Rx Drugs 0  Alcohol 0  Illegal Drugs 0  Rx Drugs 0  Age between 16-45 years  0  History of Preadolescent Sexual Abuse 0  Psychological Disease 0  Depression 0  Opioid Risk Tool Scoring 0  Opioid Risk Interpretation Low Risk     Pain contract signed on:   8. Benign prostatic hyperplasia without lower urinary tract symptoms No voiding issues Lab Results  Component Value Date   PSA1 2.4 01/18/2021   PSA1 3.3 06/29/2018   PSA1 3.0 02/20/2017   PSA 3.3 01/08/2015   PSA 3.4 10/05/2014   PSA 2.9 06/13/2014      9. Peripheral edema Has daily lower ext edema. Resolves some at night when sleeping.  10. GAD (generalized anxiety disorder) Is on klonopin BID.    08/18/2022    9:30 AM 02/14/2022   12:36 PM 11/14/2021    8:15 AM 04/17/2021   10:40 AM  GAD 7 : Generalized Anxiety Score  Nervous, Anxious, on Edge 1 0 0 0  Control/stop worrying 1 0 0 0  Worry too much - different things 1 0 0 0  Trouble relaxing 0 0 0 0  Restless 0 0 0 0  Easily annoyed or irritable 0 0 0 0  Afraid - awful might happen 0 0 0 0  Total GAD 7 Score 3 0 0 0  Anxiety Difficulty Not difficult at all Not difficult at all Not difficult at all Not difficult at all      11. Primary insomnia Is on trazadone to sleep and sleeps about 7 hours a night.  12. Idiopathic chronic gout of multiple sites without tophus Denies any recent flare ups  13. Severe obesity (BMI >= 40) (HCC) Weight is up 4lbs Wt Readings from Last 3 Encounters:  08/18/22 264 lb (119.7 kg)  04/25/22 260 lb (117.9 kg)  02/14/22 261 lb (118.4 kg)   BMI Readings from Last 3 Encounters:   08/18/22 40.14 kg/m  04/25/22 39.53 kg/m  02/14/22 39.68 kg/m      New complaints: None today  Allergies  Allergen Reactions   Carafate [Sucralfate] Other (See Comments)    TOOK SKIN FROM HIS MOUTH   Ciprofloxacin Hcl Other (See Comments)    CAN'T REMEMBER   Prevacid [Lansoprazole] Other (See Comments)    MADE HIM FEEL BAD   Quinolones     Patient was warned about not using Cipro and similar antibiotics. Recent studies have raised concern that fluoroquinolone antibiotics could be associated with an increased risk of aortic aneurysm Fluoroquinolones have non-antimicrobial properties that might jeopardise the integrity of the extracellular matrix of the vascular wall In a  propensity score matched cohort study in [place], there was a 66% increased rate of aortic aneurysm or dissection associated with oral fluoroquinolone use, compared wit   Sulfa Antibiotics Diarrhea    SEVERE   Outpatient Encounter Medications as of 08/18/2022  Medication Sig   allopurinol (ZYLOPRIM) 300 MG tablet Take 1 tablet (300 mg total) by mouth daily.   benazepril (LOTENSIN) 20 MG tablet Take 1 tablet (20 mg total) by mouth daily.   clonazePAM (KLONOPIN) 0.5 MG tablet Take 1 tablet (0.5 mg total) by mouth 2 (two) times daily as needed.   fish oil-omega-3 fatty acids 1000 MG capsule Take 2 g by mouth daily.   furosemide (LASIX) 40 MG tablet Take 1 tablet (40 mg total) by mouth daily.   meclizine (ANTIVERT) 25 MG tablet TAKE 1/2 TO 1 (ONE-HALF TO ONE) TABLET BY MOUTH THREE TIMES DAILY AS NEEDED FOR DIZZINESS (Patient not taking: Reported on 04/25/2022)   Melatonin 1 MG CHEW Chew by mouth.   metoprolol succinate (TOPROL-XL) 25 MG 24 hr tablet Take 1 tablet (25 mg total) by mouth daily.   Multiple Vitamins-Minerals (COMPLETE MULTIVITAMIN/MINERAL PO) Take 1 tablet by mouth daily.   NIFEdipine (PROCARDIA-XL/NIFEDICAL-XL) 30 MG 24 hr tablet Take 1 tablet (30 mg total) by mouth daily.   Potassium 99 MG TABS Take  1 tablet (99 mg total) by mouth daily.   rosuvastatin (CRESTOR) 5 MG tablet Take 1 tablet (5 mg total) by mouth every other day.   traMADol (ULTRAM) 50 MG tablet Take 1 tablet (50 mg total) by mouth 2 (two) times daily.   traZODone (DESYREL) 50 MG tablet Take 0.5-1 tablets (25-50 mg total) by mouth at bedtime as needed. for sleep   No facility-administered encounter medications on file as of 08/18/2022.    Past Surgical History:  Procedure Laterality Date   CARDIAC CATHETERIZATION N/A 06/27/2015  Procedure: Left Heart Cath and Coronary Angiography;  Surgeon: Troy Sine, MD;  Location: [facility];  Service: Cardiovascular;  Laterality: N/A;   HERNIA REPAIR     KIDNEY STONE SURGERY      Family History  Problem Relation Age of Onset   Heart disease Father    Heart attack Father    Diabetes Sister    Heart disease Brother    Deep vein thrombosis Brother    Congestive Heart Failure Brother    Cancer Brother    Leukemia Brother    Heart attack Brother         Review of Systems  Constitutional:  Negative for diaphoresis.  Eyes:  Negative for pain.  Respiratory:  Negative for shortness of breath.   Cardiovascular:  Negative for chest pain, palpitations and leg swelling.  Gastrointestinal:  Negative for abdominal pain.  Endocrine: Negative for polydipsia.  Skin:  Negative for rash.  Neurological:  Negative for dizziness, weakness and headaches.  Hematological:  Does not bruise/bleed easily.  All other systems reviewed and are negative.      Objective:   Physical Exam Vitals and nursing note reviewed.  Constitutional:      Appearance: Normal appearance. He is well-developed.  HENT:     Head: Normocephalic.     Nose: Nose normal.     Mouth/Throat:     Mouth: Mucous membranes are moist.     Pharynx: Oropharynx is clear.  Eyes:     Pupils: Pupils are equal, round, and reactive to light.  Neck:     Thyroid: No thyroid mass or thyromegaly.     Vascular: No  carotid bruit or JVD.     Trachea: Phonation normal.  Cardiovascular:     Rate and Rhythm: Normal rate and regular rhythm.  Pulmonary:     Effort: Pulmonary effort is normal. No respiratory distress.     Breath sounds: Normal breath sounds.  Abdominal:     General: Bowel sounds are normal.     Palpations: Abdomen is soft.     Tenderness: There is no abdominal tenderness.  Musculoskeletal:        General: Normal range of motion.     Cervical back: Normal range of motion and neck supple.  Lymphadenopathy:     Cervical: No cervical adenopathy.  Skin:    General: Skin is warm and dry.  Neurological:     Mental Status: He is alert and oriented to person, place, and time.  Psychiatric:        Behavior: Behavior normal.        Thought Content: Thought content normal.        Judgment: Judgment normal.    BP 128/63   Pulse 73   Temp 98.1 F (36.7 C) (Temporal)   Resp 20   Ht '5\' 8"'$  (1.727 m)   Wt 264 lb (119.7 kg)   SpO2 96%   BMI 40.14 kg/m         Assessment & Plan:   Joseph [name] comes in today with chief complaint of Medical Management of Chronic Issues (Areas bleeding on both ears/Cough/)   Diagnosis and orders addressed:  1. Primary hypertension Low sodium diet - furosemide (LASIX) 40 MG tablet; Take 1 tablet (40 mg total) by mouth daily.  Dispense: 90 tablet; Refill: 1 - metoprolol succinate (TOPROL-XL) 25 MG 24 hr tablet; Take 1 tablet (25 mg total) by mouth daily.  Dispense: 90 tablet; Refill: 1 - benazepril (LOTENSIN)  20 MG tablet; Take 1 tablet (20 mg total) by mouth daily.  Dispense: 90 tablet; Refill: 1  2. Hyperlipidemia with target LDL less than 100 Low fat diet - rosuvastatin (CRESTOR) 5 MG tablet; Take 1 tablet (5 mg total) by mouth every other day.  Dispense: 45 tablet; Refill: 1  3. Hypokalemia Labs pending - Potassium 99 MG TABS; Take 1 tablet (99 mg total) by mouth daily.  Dispense: 90 tablet; Refill: 1  4. Aneurysm of ascending aorta without  rupture [facility]) Keep follow up with cardiothoracic surgeon - NIFEdipine (PROCARDIA-XL/NIFEDICAL-XL) 30 MG 24 hr tablet; Take 1 tablet (30 mg total) by mouth daily.  Dispense: 90 tablet; Refill: 1  5. Raynaud's disease without gangrene Keep hands and feet warm  6. Asymptomatic varicose veins of both lower extremities  7. Polyneuropathy Do not go barefooted Check feet daily - traMADol (ULTRAM) 50 MG tablet; Take 1 tablet (50 mg total) by mouth 2 (two) times daily.  Dispense: 60 tablet; Refill: 2  8. Benign prostatic hyperplasia without lower urinary tract symptoms Labs pending  9. Peripheral edema Elevate legs when sitting  10. GAD (generalized anxiety disorder) Stress management - clonazePAM (KLONOPIN) 0.5 MG tablet; Take 1 tablet (0.5 mg total) by mouth 2 (two) times daily as needed.  Dispense: 60 tablet; Refill: 0  11. Primary insomnia Bedtime routine - traZODone (DESYREL) 50 MG tablet; Take 0.5-1 tablets (25-50 mg total) by mouth at bedtime as needed. for sleep  Dispense: 90 tablet; Refill: 1  12. Idiopathic chronic gout of multiple sites without tophus - allopurinol (ZYLOPRIM) 300 MG tablet; Take 1 tablet (300 mg total) by mouth daily.  Dispense: 90 tablet; Refill: 1  13. Severe obesity (BMI >= 40) (HCC) Discussed diet and exercise for person with BMI >25 Will recheck weight in 3-6 months    Labs pending Health Maintenance reviewed Diet and exercise encouraged  Follow up plan: 6 months   [place], FNP

## 2022-08-18 NOTE — Addendum Note (Signed)
Addended by: Chevis Pretty on: 08/18/2022 09:56 AM   Modules accepted: Orders

## 2022-08-19 LAB — CBC WITH DIFFERENTIAL/PLATELET
Basophils Absolute: 0.1 10*3/uL (ref 0.0–0.2)
Basos: 1 %
EOS (ABSOLUTE): 0.3 10*3/uL (ref 0.0–0.4)
Eos: 3 %
Hematocrit: 44.1 % (ref 37.5–51.0)
Hemoglobin: 14.1 g/dL (ref 13.0–17.7)
Immature Grans (Abs): 0 10*3/uL (ref 0.0–0.1)
Immature Granulocytes: 0 %
Lymphocytes Absolute: 1.1 10*3/uL (ref 0.7–3.1)
Lymphs: 15 %
MCH: 33 pg (ref 26.6–33.0)
MCHC: 32 g/dL (ref 31.5–35.7)
MCV: 103 fL — ABNORMAL HIGH (ref 79–97)
Monocytes Absolute: 0.8 10*3/uL (ref 0.1–0.9)
Monocytes: 11 %
Neutrophils Absolute: 5.1 10*3/uL (ref 1.4–7.0)
Neutrophils: 70 %
Platelets: 217 10*3/uL (ref 150–450)
RBC: 4.27 x10E6/uL (ref 4.14–5.80)
RDW: 13.1 % (ref 11.6–15.4)
WBC: 7.3 10*3/uL (ref 3.4–10.8)

## 2022-08-19 LAB — CMP14+EGFR
ALT: 17 IU/L (ref 0–44)
AST: 19 IU/L (ref 0–40)
Albumin/Globulin Ratio: 1.8 (ref 1.2–2.2)
Albumin: 4.2 g/dL (ref 3.8–4.8)
Alkaline Phosphatase: 59 IU/L (ref 44–121)
BUN/Creatinine Ratio: 15 (ref 10–24)
BUN: 14 mg/dL (ref 8–27)
Bilirubin Total: 0.3 mg/dL (ref 0.0–1.2)
CO2: 19 mmol/L — ABNORMAL LOW (ref 20–29)
Calcium: 9.4 mg/dL (ref 8.6–10.2)
Chloride: 102 mmol/L (ref 96–106)
Creatinine, Ser: 0.95 mg/dL (ref 0.76–1.27)
Globulin, Total: 2.4 g/dL (ref 1.5–4.5)
Glucose: 112 mg/dL — ABNORMAL HIGH (ref 70–99)
Potassium: 4.6 mmol/L (ref 3.5–5.2)
Sodium: 140 mmol/L (ref 134–144)
Total Protein: 6.6 g/dL (ref 6.0–8.5)
eGFR: 81 mL/min/{1.73_m2} (ref >59)

## 2022-08-19 LAB — LIPID PANEL
Chol/HDL Ratio: 5 ratio (ref 0.0–5.0)
Cholesterol, Total: 186 mg/dL (ref 100–199)
HDL: 37 mg/dL — ABNORMAL LOW (ref >39)
LDL Chol Calc (NIH): 117 mg/dL — ABNORMAL HIGH (ref 0–99)
Triglycerides: 181 mg/dL — ABNORMAL HIGH (ref 0–149)
VLDL Cholesterol Cal: 32 mg/dL (ref 5–40)

## 2022-10-17 ENCOUNTER — Other Ambulatory Visit: Payer: Self-pay | Admitting: Nurse Practitioner

## 2022-10-17 DIAGNOSIS — F411 Generalized anxiety disorder: Secondary | ICD-10-CM

## 2022-11-18 ENCOUNTER — Encounter: Payer: Self-pay | Admitting: Nurse Practitioner

## 2022-11-18 ENCOUNTER — Ambulatory Visit: Payer: Medicare HMO | Attending: Nurse Practitioner | Admitting: Nurse Practitioner

## 2022-11-18 VITALS — BP 116/70 | HR 86 | Ht 68.0 in | Wt 259.0 lb

## 2022-11-18 DIAGNOSIS — I700 Atherosclerosis of aorta: Secondary | ICD-10-CM | POA: Diagnosis not present

## 2022-11-18 DIAGNOSIS — I2510 Atherosclerotic heart disease of native coronary artery without angina pectoris: Secondary | ICD-10-CM

## 2022-11-18 DIAGNOSIS — I10 Essential (primary) hypertension: Secondary | ICD-10-CM | POA: Diagnosis not present

## 2022-11-18 DIAGNOSIS — E669 Obesity, unspecified: Secondary | ICD-10-CM | POA: Diagnosis not present

## 2022-11-18 DIAGNOSIS — E785 Hyperlipidemia, unspecified: Secondary | ICD-10-CM | POA: Diagnosis not present

## 2022-11-18 DIAGNOSIS — I7121 Aneurysm of the ascending aorta, without rupture: Secondary | ICD-10-CM | POA: Diagnosis not present

## 2022-11-18 DIAGNOSIS — Z79899 Other long term (current) drug therapy: Secondary | ICD-10-CM | POA: Diagnosis not present

## 2022-11-18 DIAGNOSIS — R9431 Abnormal electrocardiogram [ECG] [EKG]: Secondary | ICD-10-CM

## 2022-11-18 NOTE — Patient Instructions (Addendum)
Medication Instructions:  Your physician has recommended you make the following change in your medication:  Stop potassium Continue other medications the same  Labwork: Your physician recommends that you return for a FASTING lipid profile, CMET, Magnesium, TSH, T3 & T4. Please do not eat or drink for at least 8 hours when you have this done. You may take your medications that morning with a sip of water. Can be done at your family doctor's office  Testing/Procedures: none  Follow-Up: Your physician recommends that you schedule a follow-up appointment in: 1 year with Dr. Domenic Polite. You will receive a reminder call in the mail in about 10 months reminding you to call and schedule your appointment. If you don't receive this call, please contact our office.  Any Other Special Instructions Will Be Listed Below (If Applicable).  If you need a refill on your cardiac medications before your next appointment, please call your pharmacy.

## 2022-11-18 NOTE — Progress Notes (Unsigned)
Cardiology Office Note:    Date:  11/18/2022  ID:  Joseph [name], DOB 1940/12/02, MRN [id number]  PCP:  Chevis Pretty, [place] [facility] Cardiologist:  Rozann Lesches, MD     Referring MD: Hassell Done, Mary-Margaret, *   CC: Here for 1 year follow-up  History of Present Illness:    Joseph [name] is a 81 y.o. male with a hx of the following:  HTN Thoracic ascending aortic aneurysm Raynauds Polyneuropathy HLD Gout Obesity GAD Peripheral edema  Patient is a very pleasant 81 year old male with past medical history as mentioned above.   Previous chest CTA revealed coronary calcification with dilated pulmonary artery suggesting pulmonary arterial hypertension although no previous history was documented.  He underwent cardiac catheterization in 2016 that revealed mild CAD.  Echocardiogram in 2021 revealed normal EF, mild DD, severely dilated left atrium and mild MR, mildly dilated aortic root with mild AR.  He has been closely followed by cardiothoracic surgery for his thoracic ascending aortic aneurysm.  He had chest CT in 2022 that revealed stable ascending thoracic aortic aneurysm measuring at 4.7 cm.  Was recommended for semiannual imaging follow-up by CTA or MRA.  Last seen by Dr. Domenic Polite on July 26, 2021.  He was doing well from a cardiac perspective.  Denied any chest pain.  No medication changes were made.  Was told to follow-up in 1 year.  CT of the chest was performed in August 2023 that revealed stable ascending thoracic aortic aneurysm, still measuring at 4.7 cm.   Today he presents for 1 year follow-up.  He states he is doing well. Denies any any acute cardiac complaints or concerns.  Denies any chest pain, shortness of breath, palpitations, syncope, presyncope, dizziness, orthopnea, PND, swelling, significant weight changes, acute bleeding, or claudication.  Hobbies include spending time with grandchildren and great-grandchildren and going to  church.  Does admit to tremors that he has noticed recently, not bothersome per his report.  Denies any other questions or concerns today.  Past Medical History:  Diagnosis Date   Anxiety    Aortic atherosclerosis (HCC)    Ascending aortic aneurysm (HCC)    CAD (coronary artery disease)    Mild CAD seen on cardiac cath in 2016   Essential hypertension    GERD (gastroesophageal reflux disease)    Gout    History of kidney stones    HLD (hyperlipidemia)    Insomnia    Obesity (BMI 30-39.9)    Prolonged Q-T interval on ECG     Past Surgical History:  Procedure Laterality Date   CARDIAC CATHETERIZATION N/A 06/27/2015   Procedure: Left Heart Cath and Coronary Angiography;  Surgeon: Troy Sine, MD;  Location: [facility];  Service: Cardiovascular;  Laterality: N/A;   HERNIA REPAIR     KIDNEY STONE SURGERY      Current Medications: Current Meds  Medication Sig   allopurinol (ZYLOPRIM) 300 MG tablet Take 1 tablet (300 mg total) by mouth daily.   benazepril (LOTENSIN) 20 MG tablet Take 1 tablet (20 mg total) by mouth daily.   clonazePAM (KLONOPIN) 0.5 MG tablet Take 1 tablet by mouth twice daily as needed   fish oil-omega-3 fatty acids 1000 MG capsule Take 2 g by mouth daily.   furosemide (LASIX) 40 MG tablet Take 1 tablet (40 mg total) by mouth daily.   meclizine (ANTIVERT) 25 MG tablet TAKE 1/2 TO 1 (ONE-HALF TO ONE) TABLET BY MOUTH THREE TIMES DAILY AS  NEEDED FOR DIZZINESS   Melatonin 1 MG CHEW Chew by mouth.   metoprolol succinate (TOPROL-XL) 25 MG 24 hr tablet Take 1 tablet (25 mg total) by mouth daily.   Multiple Vitamins-Minerals (COMPLETE MULTIVITAMIN/MINERAL PO) Take 1 tablet by mouth daily.   NIFEdipine (PROCARDIA-XL/NIFEDICAL-XL) 30 MG 24 hr tablet Take 1 tablet (30 mg total) by mouth daily.   rosuvastatin (CRESTOR) 5 MG tablet Take 1 tablet (5 mg total) by mouth every other day.   traMADol (ULTRAM) 50 MG tablet Take 1 tablet (50 mg total) by mouth 2 (two) times  daily.   traZODone (DESYREL) 50 MG tablet Take 0.5-1 tablets (25-50 mg total) by mouth at bedtime as needed. for sleep    Potassium 99 MG TABS Take 1 tablet (99 mg total) by mouth daily.     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[place] [employer]  Tobacco Use   Smoking status: Former    Packs/day: 0.50    Years: 1.00    Total pack years: 0.50    Types: Cigarettes    Quit date: 03/22/1958    Years since quitting: 64.7   Smokeless tobacco: Former    Types: Chew   Tobacco comments:    [place] Use   Vaping Use: Never used  Substance and Sexual Activity   Alcohol use: No    Alcohol/week: 0.0 standard drinks of alcohol   Drug use: No   Sexual activity: Not on file  Other Topics Concern   Not on file  Social History Narrative   Lives alone, but sometimes Herma Ard, significant other stays with him; He has 2 daughters who visit often   Right handed    Caffeine use: none   Social Determinants of Health   Financial Resource Strain: Low Risk  (04/25/2022)   Overall Financial Resource Strain (CARDIA)    Difficulty of Paying Living Expenses: Not hard at all  Food Insecurity: No Food Insecurity (04/25/2022)   Hunger Vital Sign    Worried About Running Out of Food in the Last Year: Never true    [place] in the Last Year: Never true  Transportation Needs: No Transportation Needs (04/25/2022)   PRAPARE - Hydrologist (Medical): No    Lack of Transportation (Non-Medical): No  Physical Activity: Insufficiently Active (04/25/2022)   Exercise Vital Sign    Days of Exercise per  Week: 7 days    Minutes of Exercise per Session: 20 min  Stress: No Stress Concern Present (04/25/2022)   [place]    Feeling of Stress : Only a little  Social Connections: Socially Integrated (04/25/2022)   Social Connection and Isolation Panel [NHANES]    Frequency of Communication with Friends and Family: More than three times a week    Frequency of Social Gatherings with Friends and Family: More than three times a week    Attends Religious Services: More than 4 times per year    Active Member of [employer] or Organizations: Yes    Attends Archivist Meetings: More than  4 times per year    Marital Status: Living with partner     Family History: The patient's family history includes Cancer in his brother; Congestive Heart Failure in his brother; Deep vein thrombosis in his brother; Diabetes in his sister; Heart attack in his brother and father; Heart disease in his brother and father; Leukemia in his brother.  ROS:   Review of Systems  Constitutional: Negative.   HENT: Negative.    Eyes: Negative.   Respiratory: Negative.    Cardiovascular: Negative.   Gastrointestinal: Negative.   Genitourinary: Negative.   Musculoskeletal: Negative.   Skin: Negative.   Neurological:  Positive for tremors. Negative for dizziness, tingling, sensory change, speech change, focal weakness, seizures, loss of consciousness, weakness and headaches.  Endo/Heme/Allergies: Negative.   Psychiatric/Behavioral: Negative.      Please see the history of present illness.    All other systems reviewed and are negative.  EKGs/Labs/Other Studies Reviewed:    The following studies were reviewed today:   EKG:  EKG is ordered today.  The ekg ordered today demonstrates normal sinus rhythm, 85 bpm, nonspecific ST abnormality, prolonged QT, otherwise nothing acute.   CT chest on 07/28/2022: 1. Stable ascending thoracic aortic aneurysm  measuring up to 4.7 cm. Ascending thoracic aortic aneurysm. Recommend semi-annual imaging followup by CTA or MRA and referral to cardiothoracic surgery if not already obtained. This recommendation follows 2010 ACCF/AHA/AATS/ACR/ASA/SCA/SCAI/SIR/STS/SVM Guidelines for the Diagnosis and Management of Patients With Thoracic Aortic Disease. Circulation. 2010; 121: Z308-M578. Aortic aneurysm NOS (ICD10-I71.9) 2. Moderate coronary artery calcifications of the LAD and RCA. 3.  Aortic Atherosclerosis (ICD10-I70.0).  2D echocardiogram on May 29, 2020:  1. Left ventricular ejection fraction, by estimation, is 55 to 60%. The  left ventricle has normal function. The left ventricle has no regional  wall motion abnormalities. Left ventricular diastolic parameters are  consistent with Grade I diastolic  dysfunction (impaired relaxation).   2. Right ventricular systolic function is normal. The right ventricular  size is normal. There is normal pulmonary artery systolic pressure.   3. Left atrial size was severely dilated.   4. Right atrial size was mildly dilated.   5. The mitral valve is normal in structure. Mild mitral valve  regurgitation. No evidence of mitral stenosis.   6. The aortic valve is tricuspid. Aortic valve regurgitation is mild. No  aortic stenosis is present.   7. Aortic dilatation noted. 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   8. The inferior vena cava is normal in size with greater than 50%  respiratory variability, suggesting right atrial pressure of 3 mmHg.   Comparison(s): Echocardiogram done 06/05/15 showed an EF of 60%.  Left heart cath and coronary angiography on June 27, 2015: Mid LAD lesion, 25% stenosed. Mid RCA lesion, 35% stenosed. There is mild left ventricular systolic dysfunction.   Mild LV dysfunction with an ejection fraction of 45-50% with a subtle region of mild mid inferior hypocontractility.   Mild nonobstructive 2 vessel CAD with  25% smooth stenosis in the LAD immediately after the takeoff of the first diagonal vessel; normal left circumflex artery; dominant right coronary artery with smooth 30-40% mid stenosis.   RECOMMENDATION:   Medical therapy.   Recent Labs: 08/18/2022: ALT 17; BUN 14; Creatinine, Ser 0.95; Hemoglobin 14.1; Platelets 217; Potassium 4.6; Sodium 140  Recent Lipid Panel    Component Value Date/Time   CHOL 186 08/18/2022 0959   CHOL 208 (H) 03/22/2013 0928   TRIG  181 (H) 08/18/2022 0959   TRIG 142 04/18/2015 0942   TRIG 163 (H) 03/22/2013 0928   HDL 37 (L) 08/18/2022 0959   HDL 43 04/18/2015 0942   HDL 39 (L) 03/22/2013 0928   CHOLHDL 5.0 08/18/2022 0959   LDLCALC 117 (H) 08/18/2022 0959   LDLCALC 65 07/10/2014 0936   LDLCALC 136 (H) 03/22/2013 0928     Risk Assessment/Calculations:        The 10-year ASCVD risk score (Arnett DK, et al., 2019) is: 32.2%   Values used to calculate the score:     Age: 68 years     Sex: Male     Is Non-Hispanic African American: No     Diabetic: No     Tobacco smoker: No     Systolic Blood Pressure: 222 mmHg     Is BP treated: Yes     HDL Cholesterol: 37 mg/dL     Total Cholesterol: 186 mg/dL       Physical Exam:    VS:  BP 116/70   Pulse 86   Ht '5\' 8"'$  (1.727 m)   Wt 259 lb (117.5 kg)   SpO2 93%   BMI 39.38 kg/m     Wt Readings from Last 3 Encounters:  11/18/22 259 lb (117.5 kg)  08/18/22 264 lb (119.7 kg)  04/25/22 260 lb (117.9 kg)     GEN: Obese, 81 y.o. male in no acute distress HEENT: Normal NECK: No JVD; No carotid bruits CARDIAC: S1/S2, RRR, no murmurs, rubs, gallops; 2+ peripheral pulses throughout, strong and equal bilaterally RESPIRATORY:  Clear to auscultation without rales, wheezing or rhonchi  ABDOMEN: Soft, non-tender, non-distended MUSCULOSKELETAL:  No edema; No deformity  SKIN: Warm and dry NEUROLOGIC:  Alert and oriented x 3, fine tremors along bilateral arms during arm extension PSYCHIATRIC:  Normal affect    ASSESSMENT:    1. Coronary artery disease involving native coronary artery of native heart without angina pectoris   2. Hyperlipidemia with target LDL less than 100   3. Aortic atherosclerosis ([place])   4. Essential hypertension   5. Aneurysm of ascending aorta without rupture ([place])   6. Prolonged Q-T interval on ECG   7. Medication management   8. Obesity (BMI 30-39.9)    PLAN:    In order of problems listed above:  Mild CAD Stable with no anginal symptoms. No indication for ischemic evaluation.  Continue Toprol-XL and rosuvastatin. Heart healthy diet and regular cardiovascular exercise encouraged.   2. HLD, aortic atherosclerosis Labs from September 2023 revealed total cholesterol 186, HDL 37, LDL 117, and triglycerides 181.  Has history of statin intolerance.  Continue low-dose rosuvastatin and omega-3 fatty acids. Heart healthy diet and regular cardiovascular exercise encouraged.   3. HTN Blood pressure today 116/70.  Continue current medication regimen.  Will obtain CMET as mentioned below. Heart healthy diet and regular cardiovascular exercise encouraged.   4. Thoracic ascending aortic aneurysm Denies any alarming symptoms. CT of chest performed July 28, 2022 revealed stable ascending thoracic aortic aneurysm measuring 4.7 cm, similar to previous study.  Continue to follow-up with cardiothoracic surgery.  He will have a follow-up CT scan of his chest without contrast next year. Heart healthy diet and regular cardiovascular exercise encouraged.   5. Prolonged QT interval, medication management EKG today reveals sinus rhythm, 85 bpm, prolonged QTc at 490 ms.  Will obtain the following labs: CMET, Magnesium, and thyroid panel.  Continue current medication regimen.  He has been advised to discuss  the medication tramadol on his medicine list with his primary care provider.  This medication can cause QT prolongation. Will notify [place], FNP.   6. Obesity BMI today  39.38. Weight loss via diet and exercise encouraged. Discussed the impact being overweight would have on cardiovascular risk. Heart healthy diet and regular cardiovascular exercise encouraged.   7.  Disposition: Follow-up in 1 year with Dr. Domenic Polite or sooner if anything changes.     Medication Adjustments/Labs and Tests Ordered: Current medicines are reviewed at length with the patient today.  Concerns regarding medicines are outlined above.  Orders Placed This Encounter  Procedures   Comprehensive metabolic panel   Magnesium   TSH   T3, free   T4, free   EKG 12-Lead   No orders of the defined types were placed in this encounter.   Patient Instructions  Medication Instructions:  Your physician has recommended you make the following change in your medication:  Stop potassium Continue other medications the same  Labwork: Your physician recommends that you return for a FASTING lipid profile, CMET, Magnesium, TSH, T3 & T4. Please do not eat or drink for at least 8 hours when you have this done. You may take your medications that morning with a sip of water. Can be done at your family doctor's office  Testing/Procedures: none  Follow-Up: Your physician recommends that you schedule a follow-up appointment in: 1 year with Dr. Domenic Polite. You will receive a reminder call in the mail in about 10 months reminding you to call and schedule your appointment. If you don't receive this call, please contact our office.  Any Other Special Instructions Will Be Listed Below (If Applicable).  If you need a refill on your cardiac medications before your next appointment, please call your pharmacy.   Signed, Finis Bud, NP  11/19/2022 12:16 PM    [place]

## 2022-11-19 ENCOUNTER — Encounter: Payer: Self-pay | Admitting: Nurse Practitioner

## 2022-11-20 ENCOUNTER — Other Ambulatory Visit (HOSPITAL_COMMUNITY): Payer: Self-pay | Admitting: Internal Medicine

## 2022-11-20 ENCOUNTER — Other Ambulatory Visit: Payer: Medicare HMO

## 2022-11-20 DIAGNOSIS — I10 Essential (primary) hypertension: Secondary | ICD-10-CM | POA: Diagnosis not present

## 2022-11-20 DIAGNOSIS — Z79899 Other long term (current) drug therapy: Secondary | ICD-10-CM | POA: Diagnosis not present

## 2022-11-20 DIAGNOSIS — E785 Hyperlipidemia, unspecified: Secondary | ICD-10-CM | POA: Diagnosis not present

## 2022-11-20 DIAGNOSIS — R9431 Abnormal electrocardiogram [ECG] [EKG]: Secondary | ICD-10-CM | POA: Diagnosis not present

## 2022-11-20 DIAGNOSIS — I2510 Atherosclerotic heart disease of native coronary artery without angina pectoris: Secondary | ICD-10-CM | POA: Diagnosis not present

## 2022-11-21 LAB — COMPREHENSIVE METABOLIC PANEL
ALT: 17 IU/L (ref 0–44)
AST: 17 IU/L (ref 0–40)
Albumin/Globulin Ratio: 1.5 (ref 1.2–2.2)
Albumin: 4 g/dL (ref 3.8–4.8)
Alkaline Phosphatase: 65 IU/L (ref 44–121)
BUN/Creatinine Ratio: 13 (ref 10–24)
BUN: 12 mg/dL (ref 8–27)
Bilirubin Total: 0.5 mg/dL (ref 0.0–1.2)
CO2: 21 mmol/L (ref 20–29)
Calcium: 9.6 mg/dL (ref 8.6–10.2)
Chloride: 103 mmol/L (ref 96–106)
Creatinine, Ser: 0.91 mg/dL (ref 0.76–1.27)
Globulin, Total: 2.6 g/dL (ref 1.5–4.5)
Glucose: 120 mg/dL — ABNORMAL HIGH (ref 70–99)
Potassium: 4.2 mmol/L (ref 3.5–5.2)
Sodium: 139 mmol/L (ref 134–144)
Total Protein: 6.6 g/dL (ref 6.0–8.5)
eGFR: 86 mL/min/{1.73_m2} (ref >59)

## 2022-11-21 LAB — LIPID PANEL
Chol/HDL Ratio: 5.3 ratio — ABNORMAL HIGH (ref 0.0–5.0)
Cholesterol, Total: 192 mg/dL (ref 100–199)
HDL: 36 mg/dL — ABNORMAL LOW (ref >39)
LDL Chol Calc (NIH): 124 mg/dL — ABNORMAL HIGH (ref 0–99)
Triglycerides: 179 mg/dL — ABNORMAL HIGH (ref 0–149)
VLDL Cholesterol Cal: 32 mg/dL (ref 5–40)

## 2022-11-21 LAB — TSH: TSH: 1.09 u[IU]/mL (ref 0.450–4.500)

## 2022-11-21 LAB — T4, FREE: Free T4: 1.42 ng/dL (ref 0.82–1.77)

## 2022-11-21 LAB — T3, FREE: T3, Free: 3.6 pg/mL (ref 2.0–4.4)

## 2022-11-21 LAB — MAGNESIUM: Magnesium: 2 mg/dL (ref 1.6–2.3)

## 2022-11-28 ENCOUNTER — Other Ambulatory Visit: Payer: Self-pay | Admitting: Nurse Practitioner

## 2022-11-28 DIAGNOSIS — F411 Generalized anxiety disorder: Secondary | ICD-10-CM

## 2022-12-16 ENCOUNTER — Other Ambulatory Visit: Payer: Self-pay | Admitting: Nurse Practitioner

## 2022-12-16 DIAGNOSIS — G629 Polyneuropathy, unspecified: Secondary | ICD-10-CM

## 2023-01-02 ENCOUNTER — Other Ambulatory Visit: Payer: Self-pay | Admitting: Nurse Practitioner

## 2023-01-02 DIAGNOSIS — F411 Generalized anxiety disorder: Secondary | ICD-10-CM

## 2023-01-06 ENCOUNTER — Other Ambulatory Visit: Payer: Self-pay | Admitting: Nurse Practitioner

## 2023-01-06 DIAGNOSIS — F411 Generalized anxiety disorder: Secondary | ICD-10-CM

## 2023-01-06 DIAGNOSIS — G629 Polyneuropathy, unspecified: Secondary | ICD-10-CM

## 2023-01-08 ENCOUNTER — Other Ambulatory Visit: Payer: Self-pay | Admitting: Nurse Practitioner

## 2023-01-08 DIAGNOSIS — G629 Polyneuropathy, unspecified: Secondary | ICD-10-CM

## 2023-01-08 DIAGNOSIS — F411 Generalized anxiety disorder: Secondary | ICD-10-CM

## 2023-01-09 ENCOUNTER — Ambulatory Visit: Payer: Medicare HMO | Admitting: Nurse Practitioner

## 2023-01-23 ENCOUNTER — Ambulatory Visit (INDEPENDENT_AMBULATORY_CARE_PROVIDER_SITE_OTHER): Payer: Medicare HMO | Admitting: Nurse Practitioner

## 2023-01-23 ENCOUNTER — Encounter: Payer: Self-pay | Admitting: Nurse Practitioner

## 2023-01-23 VITALS — BP 135/76 | HR 114 | Temp 97.6°F | Resp 20 | Ht 68.0 in | Wt 259.0 lb

## 2023-01-23 DIAGNOSIS — R42 Dizziness and giddiness: Secondary | ICD-10-CM | POA: Diagnosis not present

## 2023-01-23 MED ORDER — MECLIZINE HCL 25 MG PO TABS: 25.0000 mg | ORAL_TABLET | Freq: Three times a day (TID) | ORAL | 1 refills | Status: DC | PRN

## 2023-01-23 NOTE — Patient Instructions (Signed)
Vertigo Vertigo is the feeling that you or the things around you are moving when they are not. This feeling can come and go at any time. Vertigo often goes away on its own. This condition can be dangerous if it happens when you are doing activities like driving or working with machines. Your doctor will do tests to find the cause of your vertigo. These tests will also help your doctor decide on the best treatment for you. Follow these instructions at home: Eating and drinking     Drink enough fluid to keep your pee (urine) pale yellow. Do not drink alcohol. Activity Return to your normal activities when your doctor says that it is safe. In the morning, first sit up on the side of the bed. When you feel okay, stand slowly while you hold onto something until you know that your balance is fine. Move slowly. Avoid sudden body or head movements or certain positions, as told by your doctor. Use a cane if you have trouble standing or walking. Sit down right away if you feel dizzy. Avoid doing any tasks or activities that can cause danger to you or others if you get dizzy. Avoid bending down if you feel dizzy. Place items in your home so that they are easy for you to reach without bending or leaning over. Do not drive or use machinery if you feel dizzy. General instructions Take over-the-counter and prescription medicines only as told by your doctor. Keep all follow-up visits. Contact a doctor if: Your medicine does not help your vertigo. Your problems get worse or you have new symptoms. You have a fever. You feel like you may vomit (nauseous), or this feeling gets worse. You start to vomit. Your family or friends see changes in how you act. You lose feeling (have numbness) in part of your body. You feel prickling and tingling in a part of your body. Get help right away if: You are always dizzy. You faint. You get very bad headaches. You get a stiff neck. Bright light starts to bother  you. You have trouble moving or talking. You feel weak in your hands, arms, or legs. You have changes in your hearing or in how you see (vision). These symptoms may be an emergency. Get help right away. Call your local emergency services (911 in the [place] Do not wait to see if the symptoms will go away. Do not drive yourself to the hospital. Summary Vertigo is the feeling that you or the things around you are moving when they are not. Your doctor will do tests to find the cause of your vertigo. You may be told to avoid some tasks, positions, or movements. Contact a doctor if your medicine is not helping, or if you have a fever, new symptoms, or a change in how you act. Get help right away if you get very bad headaches, or if you have changes in how you speak, hear, or see. This information is not intended to replace advice given to you by your health care provider. Make sure you discuss any questions you have with your health care provider. Document Revised: 10/17/2020 Document Reviewed: 10/17/2020 Elsevier Patient Education  2023 Elsevier Inc.  

## 2023-01-23 NOTE — Progress Notes (Signed)
   Subjective:    Patient ID: Joseph [name], male    DOB: Mar 27, 1941, 82 y.o.   MRN: [id number]   Chief Complaint: Dizziness   Dizziness This is a new problem. The current episode started 1 to 4 weeks ago. The problem occurs intermittently. The problem has been waxing and waning. Pertinent negatives include no chills, congestion, fatigue, headaches or visual change. Nothing aggravates the symptoms. He has tried nothing for the symptoms. The treatment provided no relief.    Patient Active Problem List   Diagnosis Date Noted   Peripheral edema 02/14/2022   GAD (generalized anxiety disorder) 04/17/2021   Polyneuropathy 06/07/2019   Hypokalemia 10/30/2015   Thoracic ascending aortic aneurysm ([place]) 07/27/2015   Varicose veins of both lower extremities 07/27/2015   Abnormal myocardial perfusion study    Severe obesity (BMI >= 40) ([place]) 04/18/2015   Raynauds disease 04/18/2015   BPH (benign prostatic hyperplasia) 07/10/2014   Gout 04/04/2014   Hypertension 03/22/2013   Hyperlipidemia with target LDL less than 100 03/22/2013   Insomnia 03/22/2013       Review of Systems  Constitutional:  Negative for chills and fatigue.  HENT:  Negative for congestion.   Eyes:  Negative for visual disturbance.  Neurological:  Positive for dizziness. Negative for headaches.       Objective:   Physical Exam Vitals reviewed.  Constitutional:      Appearance: Normal appearance.  Cardiovascular:     Rate and Rhythm: Normal rate and regular rhythm.     Heart sounds: Normal heart sounds.  Pulmonary:     Effort: Pulmonary effort is normal.     Breath sounds: Normal breath sounds.  Skin:    General: Skin is warm.  Neurological:     General: No focal deficit present.     Mental Status: He is alert and oriented to person, place, and time.     Cranial Nerves: No cranial nerve deficit.     Sensory: No sensory deficit.  Psychiatric:        Mood and Affect: Mood normal.        Behavior: Behavior  normal.    BP 135/76   Pulse (!) 114   Temp 97.6 F (36.4 C) (Temporal)   Resp 20   Ht 5' 8"$  (1.727 m)   Wt 259 lb (117.5 kg)   SpO2 92%   BMI 39.38 kg/m         Assessment & Plan:   Joseph [name] in today with chief complaint of Dizziness   1. Vertigo Force fluids Fall prevention  Meds ordered this encounter  Medications   meclizine (ANTIVERT) 25 MG tablet    Sig: Take 1 tablet (25 mg total) by mouth 3 (three) times daily as needed for dizziness.    Dispense:  30 tablet    Refill:  1    Order Specific Question:   Supervising Provider    Answer:   Caryl Pina A [id number]       The above assessment and management plan was discussed with the patient. The patient verbalized understanding of and has agreed to the management plan. Patient is aware to call the clinic if symptoms persist or worsen. Patient is aware when to return to the clinic for a follow-up visit. Patient educated on when it is appropriate to go to the emergency department.   Mary-Margaret Hassell Done, FNP

## 2023-02-05 ENCOUNTER — Other Ambulatory Visit: Payer: Self-pay | Admitting: Nurse Practitioner

## 2023-02-05 DIAGNOSIS — G629 Polyneuropathy, unspecified: Secondary | ICD-10-CM

## 2023-02-05 DIAGNOSIS — F411 Generalized anxiety disorder: Secondary | ICD-10-CM

## 2023-02-16 ENCOUNTER — Ambulatory Visit (INDEPENDENT_AMBULATORY_CARE_PROVIDER_SITE_OTHER): Payer: Medicare HMO | Admitting: Nurse Practitioner

## 2023-02-16 ENCOUNTER — Encounter: Payer: Self-pay | Admitting: Nurse Practitioner

## 2023-02-16 VITALS — BP 133/80 | HR 80 | Temp 97.9°F | Resp 20 | Ht 68.0 in | Wt 256.0 lb

## 2023-02-16 DIAGNOSIS — I7121 Aneurysm of the ascending aorta, without rupture: Secondary | ICD-10-CM

## 2023-02-16 DIAGNOSIS — F339 Major depressive disorder, recurrent, unspecified: Secondary | ICD-10-CM | POA: Insufficient documentation

## 2023-02-16 DIAGNOSIS — F5101 Primary insomnia: Secondary | ICD-10-CM

## 2023-02-16 DIAGNOSIS — E876 Hypokalemia: Secondary | ICD-10-CM

## 2023-02-16 DIAGNOSIS — R609 Edema, unspecified: Secondary | ICD-10-CM

## 2023-02-16 DIAGNOSIS — I10 Essential (primary) hypertension: Secondary | ICD-10-CM

## 2023-02-16 DIAGNOSIS — N400 Enlarged prostate without lower urinary tract symptoms: Secondary | ICD-10-CM

## 2023-02-16 DIAGNOSIS — R9439 Abnormal result of other cardiovascular function study: Secondary | ICD-10-CM

## 2023-02-16 DIAGNOSIS — E785 Hyperlipidemia, unspecified: Secondary | ICD-10-CM | POA: Diagnosis not present

## 2023-02-16 DIAGNOSIS — F411 Generalized anxiety disorder: Secondary | ICD-10-CM

## 2023-02-16 DIAGNOSIS — G629 Polyneuropathy, unspecified: Secondary | ICD-10-CM

## 2023-02-16 DIAGNOSIS — I7300 Raynaud's syndrome without gangrene: Secondary | ICD-10-CM | POA: Diagnosis not present

## 2023-02-16 DIAGNOSIS — Z0000 Encounter for general adult medical examination without abnormal findings: Secondary | ICD-10-CM

## 2023-02-16 DIAGNOSIS — Z0001 Encounter for general adult medical examination with abnormal findings: Secondary | ICD-10-CM | POA: Diagnosis not present

## 2023-02-16 DIAGNOSIS — Z6838 Body mass index (BMI) 38.0-38.9, adult: Secondary | ICD-10-CM

## 2023-02-16 DIAGNOSIS — M1A09X0 Idiopathic chronic gout, multiple sites, without tophus (tophi): Secondary | ICD-10-CM

## 2023-02-16 LAB — CBC WITH DIFFERENTIAL/PLATELET

## 2023-02-16 LAB — LIPID PANEL

## 2023-02-16 MED ORDER — METOPROLOL SUCCINATE ER 25 MG PO TB24: 25.0000 mg | ORAL_TABLET | Freq: Every day | ORAL | 1 refills | Status: DC

## 2023-02-16 MED ORDER — NIFEDIPINE ER OSMOTIC RELEASE 30 MG PO TB24: 30.0000 mg | ORAL_TABLET | Freq: Every day | ORAL | 1 refills | Status: DC

## 2023-02-16 MED ORDER — CLONAZEPAM 0.5 MG PO TABS: 0.5000 mg | ORAL_TABLET | Freq: Two times a day (BID) | ORAL | 3 refills | Status: DC | PRN

## 2023-02-16 MED ORDER — BENAZEPRIL HCL 20 MG PO TABS: 20.0000 mg | ORAL_TABLET | Freq: Every day | ORAL | 1 refills | Status: DC

## 2023-02-16 MED ORDER — TRAZODONE HCL 50 MG PO TABS: 25.0000 mg | ORAL_TABLET | Freq: Every evening | ORAL | 1 refills | Status: DC | PRN

## 2023-02-16 MED ORDER — FUROSEMIDE 40 MG PO TABS: 40.0000 mg | ORAL_TABLET | Freq: Every day | ORAL | 1 refills | Status: DC

## 2023-02-16 MED ORDER — ESCITALOPRAM OXALATE 10 MG PO TABS: 10.0000 mg | ORAL_TABLET | Freq: Every day | ORAL | 1 refills | Status: DC

## 2023-02-16 MED ORDER — ROSUVASTATIN CALCIUM 5 MG PO TABS: 5.0000 mg | ORAL_TABLET | ORAL | 1 refills | Status: DC

## 2023-02-16 MED ORDER — TRAMADOL HCL 50 MG PO TABS: 50.0000 mg | ORAL_TABLET | Freq: Two times a day (BID) | ORAL | 2 refills | Status: DC

## 2023-02-16 NOTE — Addendum Note (Signed)
Addended by: Chevis Pretty on: 02/16/2023 09:35 AM   Modules accepted: Orders

## 2023-02-16 NOTE — Patient Instructions (Signed)
Managing Depression, Adult Depression is a mental health condition that affects your thoughts, feelings, and actions. Being diagnosed with depression can bring you relief if you did not know why you have felt or behaved a certain way. It could also leave you feeling overwhelmed. Finding ways to manage your symptoms can help you feel more positive about your future. How to manage lifestyle changes Being depressed is difficult. Depression can increase the level of everyday stress. Stress can make depression symptoms worse. You may believe your symptoms cannot be managed or will never improve. However, there are many things you can try to help manage your symptoms. There is hope. Managing stress  Stress is your body's reaction to life changes and events, both good and bad. Stress can add to your feelings of depression. Learning to manage your stress can help lessen your feelings of depression. Try some of the following approaches to reducing your stress (stress reduction techniques): Listen to music that you enjoy and that inspires you. Try using a meditation app or take a meditation class. Develop a practice that helps you connect with your spiritual self. Walk in nature, pray, or go to a place of worship. Practice deep breathing. To do this, inhale slowly through your nose. Pause at the top of your inhale for a few seconds and then exhale slowly, letting yourself relax. Repeat this three or four times. Practice yoga to help relax and work your muscles. Choose a stress reduction technique that works for you. These techniques take time and practice to develop. Set aside 5-15 minutes a day to do them. Therapists can offer training in these techniques. Do these things to help manage stress: Keep a journal. Know your limits. Set healthy boundaries for yourself and others, such as saying "no" when you think something is too much. Pay attention to how you react to certain situations. You may not be able to  control everything, but you can change your reaction. Add humor to your life by watching funny movies or shows. Make time for activities that you enjoy and that relax you. Spend less time using electronics, especially at night before bed. The light from screens can make your brain think it is time to get up rather than go to bed.  Medicines Medicines, such as antidepressants, are often a part of treatment for depression. Talk with your pharmacist or health care provider about all the medicines, supplements, and herbal products that you take, their possible side effects, and what medicines and other products are safe to take together. Make sure to report any side effects you may have to your health care provider. Relationships Your health care provider may suggest family therapy, couples therapy, or individual therapy as part of your treatment. How to recognize changes Everyone responds differently to treatment for depression. As you recover from depression, you may start to: Have more interest in doing activities. Feel more hopeful. Have more energy. Eat a more regular amount of food. Have better mental focus. It is important to recognize if your depression is not getting better or is getting worse. The symptoms you had in the beginning may return, such as: Feeling tired. Eating too much or too little. Sleeping too much or too little. Feeling restless, agitated, or hopeless. Trouble focusing or making decisions. Having unexplained aches and pains. Feeling irritable, angry, or aggressive. If you or your family members notice these symptoms coming back, let your health care provider know right away. Follow these instructions at home: Activity Try to   get some form of exercise each day, such as walking. Try yoga, mindfulness, or other stress reduction techniques. Participate in group activities if you are able. Lifestyle Get enough sleep. Cut down on or stop using caffeine, tobacco,  alcohol, and any other harmful substances. Eat a healthy diet that includes plenty of vegetables, fruits, whole grains, low-fat dairy products, and lean protein. Limit foods that are high in solid fats, added sugar, or salt (sodium). General instructions Take over-the-counter and prescription medicines only as told by your health care provider. Keep all follow-up visits. It is important for your health care provider to check on your mood, behavior, and medicines. Your health care provider may need to make changes to your treatment. Where to find support Talking to others  Friends and family members can be sources of support and guidance. Talk to trusted friends or family members about your condition. Explain your symptoms and let them know that you are working with a health care provider to treat your depression. Tell friends and family how they can help. Finances Find mental health providers that fit with your financial situation. Talk with your health care provider if you are worried about access to food, housing, or medicine. Call your insurance company to learn about your co-pays and prescription plan. Where to find more information You can find support in your area from: Anxiety and Depression Association of America (ADAA): adaa.org Mental Health America: mentalhealthamerica.net National Alliance on Mental Illness: nami.org Contact a health care provider if: You stop taking your antidepressant medicines, and you have any of these symptoms: Nausea. Headache. Light-headedness. Chills and body aches. Not being able to sleep (insomnia). You or your friends and family think your depression is getting worse. Get help right away if: You have thoughts of hurting yourself or others. Get help right away if you feel like you may hurt yourself or others, or have thoughts about taking your own life. Go to your nearest emergency room or: Call 911. Call the National Suicide Prevention Lifeline at  1-800-273-8255 or 988. This is open 24 hours a day. Text the Crisis Text Line at 741741. This information is not intended to replace advice given to you by your health care provider. Make sure you discuss any questions you have with your health care provider. Document Revised: 03/25/2022 Document Reviewed: 03/25/2022 Elsevier Patient Education  2023 Elsevier Inc.  

## 2023-02-16 NOTE — Progress Notes (Signed)
Subjective:    Patient ID: Joseph [name], male    DOB: [birth year], 82 y.o.   MRN: [id number]   Chief Complaint: annual physical   HPI:  Joseph [name] is a 82 y.o. who identifies as a male who was assigned male at birth.   Social history: Lives with: by hisself- has girlfriend he sees daily Work history: retired   Scientist, forensic in today for follow up of the following chronic medical issues:  1. Annual physical exam   2. Primary hypertension No c/o chest pain, sob or headache. Doe snot check blood pressure at home. BP Readings from Last 3 Encounters:  01/23/23 135/76  11/18/22 116/70  08/18/22 128/63     3. Hyperlipidemia with target LDL less than 100 Does not watch diet and does no exercise. Lab Results  Component Value Date   CHOL 192 11/20/2022   HDL 36 (L) 11/20/2022   LDLCALC 124 (H) 11/20/2022   TRIG 179 (H) 11/20/2022   CHOLHDL 5.3 (H) 11/20/2022      4. Polyneuropathy Has burning of toes and sometimes numbness  5. Raynaud's disease without gangrene Not really bothering I'm since the weather has not been very cold.  6. Hypokalemia No c/o muscle cramps. Lab Results  Component Value Date   K 4.2 11/20/2022    7. Benign prostatic hyperplasia without lower urinary tract symptoms No voiding issues  8. Idiopathic chronic gout of multiple sites without tophus No recent flare ups  9. Primary insomnia Sleeps well some nights.   10. Peripheral edema Has daily edema in bil lower ext.  11. Abnormal myocardial perfusion study Sees cardiology every 6 months  12. GAD (generalized anxiety disorder) Is anxious. He has been taking more of klonopin then he is suppose to. Says he needs to go up on dose.    02/16/2023    9:08 AM 01/23/2023    2:41 PM 08/18/2022    9:30 AM 02/14/2022   12:36 PM  GAD 7 : Generalized Anxiety Score  Nervous, Anxious, on Edge 1 1 1  0  Control/stop worrying 1 0 1 0  Worry too much - different things 1 0 1 0  Trouble relaxing 1 0 0 0   Restless 1 0 0 0  Easily annoyed or irritable 1 0 0 0  Afraid - awful might happen 1 0 0 0  Total GAD 7 Score 7 1 3  0  Anxiety Difficulty Not difficult at all Not difficult at all Not difficult at all Not difficult at all      02/16/2023    9:25 AM 02/16/2023    9:07 AM 01/23/2023    2:41 PM  Depression screen PHQ 2/9  Decreased Interest 3 0 0  Down, Depressed, Hopeless 2 0 0  PHQ - 2 Score 5 0 0  Altered sleeping 1 0 0  Tired, decreased energy 1 0 0  Change in appetite 0 0 0  Feeling bad or failure about yourself  1 0 0  Trouble concentrating 0 0 0  Moving slowly or fidgety/restless 0 0 0  Suicidal thoughts 0 0 0  PHQ-9 Score 8 0 0  Difficult doing work/chores Not difficult at all Not difficult at all Not difficult at all        13. Severe obesity (BMI >= 40) (HCC) Weight is down 3 lbs Wt Readings from Last 3 Encounters:  02/16/23 256 lb (116.1 kg)  01/23/23 259 lb (117.5 kg)  11/18/22 259 lb (117.5 kg)  BMI Readings from Last 3 Encounters:  02/16/23 38.92 kg/m  01/23/23 39.38 kg/m  11/18/22 39.38 kg/m      New complaints: Anxiety has increased. Not sure if is depession or anxiety. But he just is not hisself.  Allergies  Allergen Reactions   Carafate [Sucralfate] Other (See Comments)    TOOK SKIN FROM HIS MOUTH   Ciprofloxacin Hcl Other (See Comments)    CAN'T REMEMBER   Prevacid [Lansoprazole] Other (See Comments)    MADE HIM FEEL BAD   Quinolones     Patient was warned about not using Cipro and similar antibiotics. Recent studies have raised concern that fluoroquinolone antibiotics could be associated with an increased risk of aortic aneurysm Fluoroquinolones have non-antimicrobial properties that might jeopardise the integrity of the extracellular matrix of the vascular wall In a  propensity score matched cohort study in [place], there was a 66% increased rate of aortic aneurysm or dissection associated with oral fluoroquinolone use, compared wit    Sulfa Antibiotics Diarrhea    SEVERE   Outpatient Encounter Medications as of 02/16/2023  Medication Sig   allopurinol (ZYLOPRIM) 300 MG tablet Take 1 tablet (300 mg total) by mouth daily.   benazepril (LOTENSIN) 20 MG tablet Take 1 tablet (20 mg total) by mouth daily.   clonazePAM (KLONOPIN) 0.5 MG tablet Take 1 tablet by mouth twice daily as needed   fish oil-omega-3 fatty acids 1000 MG capsule Take 2 g by mouth daily.   meclizine (ANTIVERT) 25 MG tablet Take 1 tablet (25 mg total) by mouth 3 (three) times daily as needed for dizziness.   Melatonin 1 MG CHEW Chew by mouth.   metoprolol succinate (TOPROL-XL) 25 MG 24 hr tablet Take 1 tablet (25 mg total) by mouth daily.   Multiple Vitamins-Minerals (COMPLETE MULTIVITAMIN/MINERAL PO) Take 1 tablet by mouth daily.   NIFEdipine (PROCARDIA-XL/NIFEDICAL-XL) 30 MG 24 hr tablet Take 1 tablet (30 mg total) by mouth daily.   rosuvastatin (CRESTOR) 5 MG tablet Take 1 tablet (5 mg total) by mouth every other day.   traMADol (ULTRAM) 50 MG tablet Take 1 tablet by mouth twice daily   traZODone (DESYREL) 50 MG tablet Take 0.5-1 tablets (25-50 mg total) by mouth at bedtime as needed. for sleep   furosemide (LASIX) 40 MG tablet Take 1 tablet (40 mg total) by mouth daily.   No facility-administered encounter medications on file as of 02/16/2023.    Past Surgical History:  Procedure Laterality Date   CARDIAC CATHETERIZATION N/A 06/27/2015   Procedure: Left Heart Cath and Coronary Angiography;  Surgeon: Troy Sine, MD;  Location: [facility];  Service: Cardiovascular;  Laterality: N/A;   HERNIA REPAIR     KIDNEY STONE SURGERY      Family History  Problem Relation Age of Onset   Heart disease Father    Heart attack Father    Diabetes Sister    Heart disease Brother    Deep vein thrombosis Brother    Congestive Heart Failure Brother    Cancer Brother    Leukemia Brother    Heart attack Brother       Controlled substance contract:  02/16/23- urine drug screen today     Review of Systems  Constitutional:  Negative for diaphoresis.  Eyes:  Negative for pain.  Respiratory:  Negative for shortness of breath.   Cardiovascular:  Negative for chest pain, palpitations and leg swelling.  Gastrointestinal:  Negative for abdominal pain.  Endocrine: Negative for polydipsia.  Skin:  Negative for rash.  Neurological:  Negative for dizziness, weakness and headaches.  Hematological:  Does not bruise/bleed easily.  All other systems reviewed and are negative.      Objective:   Physical Exam Vitals and nursing note reviewed.  Constitutional:      Appearance: Normal appearance. He is well-developed.  HENT:     Head: Normocephalic.     Nose: Nose normal.     Mouth/Throat:     Mouth: Mucous membranes are moist.     Pharynx: Oropharynx is clear.  Eyes:     Pupils: Pupils are equal, round, and reactive to light.  Neck:     Thyroid: No thyroid mass or thyromegaly.     Vascular: No carotid bruit or JVD.     Trachea: Phonation normal.  Cardiovascular:     Rate and Rhythm: Normal rate and regular rhythm.  Pulmonary:     Effort: Pulmonary effort is normal. No respiratory distress.     Breath sounds: Normal breath sounds.  Abdominal:     General: Bowel sounds are normal.     Palpations: Abdomen is soft.     Tenderness: There is no abdominal tenderness.  Musculoskeletal:        General: Normal range of motion.     Cervical back: Normal range of motion and neck supple.     Right lower leg: Edema (1+) present.     Left lower leg: Edema (1+) present.  Lymphadenopathy:     Cervical: No cervical adenopathy.  Skin:    General: Skin is warm and dry.  Neurological:     Mental Status: He is alert and oriented to person, place, and time.  Psychiatric:        Behavior: Behavior normal.        Thought Content: Thought content normal.        Judgment: Judgment normal.    BP 133/80   Pulse 80   Temp 97.9 F (36.6 C)  (Temporal)   Resp 20   Ht 5\' 8"  (1.727 m)   Wt 256 lb (116.1 kg)   SpO2 94%   BMI 38.92 kg/m         Assessment & Plan:   Joseph [name] comes in today with chief complaint of Medical Management of Chronic Issues   Diagnosis and orders addressed:  1. Annual physical exam   2. Primary hypertension Low sodium diet - benazepril (LOTENSIN) 20 MG tablet; Take 1 tablet (20 mg total) by mouth daily.  Dispense: 90 tablet; Refill: 1 - furosemide (LASIX) 40 MG tablet; Take 1 tablet (40 mg total) by mouth daily.  Dispense: 90 tablet; Refill: 1 - metoprolol succinate (TOPROL-XL) 25 MG 24 hr tablet; Take 1 tablet (25 mg total) by mouth daily.  Dispense: 90 tablet; Refill: 1 - CBC with Differential/Platelet - CMP14+EGFR  3. Hyperlipidemia with target LDL less than 100 Low fat diet - rosuvastatin (CRESTOR) 5 MG tablet; Take 1 tablet (5 mg total) by mouth every other day.  Dispense: 45 tablet; Refill: 1 - Lipid panel  4. Polyneuropathy Do not go barefooted  5. Raynaud's disease without gangrene Keep fingers and feet warm  6. Hypokalemia Labs oending  7. Benign prostatic hyperplasia without lower urinary tract symptoms Report any voiding issues  8. Idiopathic chronic gout of multiple sites without tophus Low purine diet  9. Primary insomnia Bedtime routine - traZODone (DESYREL) 50 MG tablet; Take 0.5-1 tablets (25-50 mg total) by mouth at bedtime as needed. for sleep  Dispense: 90 tablet; Refill: 1  10. Peripheral edema Elevate legs when sitting  11. Abnormal myocardial perfusion study  12. GAD (generalized anxiety disorder) Stress management- no more then 2 klonopin a day - ToxASSURE Select 13 (MW), Urine - escitalopram (LEXAPRO) 10 MG tablet; Take 1 tablet (10 mg total) by mouth daily.  Dispense: 90 tablet; Refill: 1 - clonazePAM (KLONOPIN) 0.5 MG tablet; Take 1 tablet (0.5 mg total) by mouth 2 (two) times daily as needed.  Dispense: 60 tablet; Refill: 3  13. Severe  obesity (BMI >= 40) (HCC) Discussed diet and exercise for person with BMI >25 Will recheck weight in 3-6 months   14. 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  Dispense: 90 tablet; Refill: 1  15. Depression Stress management Added lexapro daily  Labs pending Health Maintenance reviewed Diet and exercise encouraged  Follow up plan: 3 months   Mary-Margaret Hassell Done, FNP

## 2023-02-17 LAB — LIPID PANEL
Chol/HDL Ratio: 5.1 ratio — ABNORMAL HIGH (ref 0.0–5.0)
Cholesterol, Total: 194 mg/dL (ref 100–199)
HDL: 38 mg/dL — ABNORMAL LOW (ref >39)
LDL Chol Calc (NIH): 124 mg/dL — ABNORMAL HIGH (ref 0–99)
Triglycerides: 178 mg/dL — ABNORMAL HIGH (ref 0–149)
VLDL Cholesterol Cal: 32 mg/dL (ref 5–40)

## 2023-02-17 LAB — CBC WITH DIFFERENTIAL/PLATELET
Basophils Absolute: 0.1 10*3/uL (ref 0.0–0.2)
Basos: 1 %
EOS (ABSOLUTE): 0.2 10*3/uL (ref 0.0–0.4)
Eos: 2 %
Hematocrit: 43.9 % (ref 37.5–51.0)
Hemoglobin: 14.5 g/dL (ref 13.0–17.7)
Immature Grans (Abs): 0 10*3/uL (ref 0.0–0.1)
Immature Granulocytes: 1 %
Lymphocytes Absolute: 1.4 10*3/uL (ref 0.7–3.1)
Lymphs: 17 %
MCH: 33.6 pg — ABNORMAL HIGH (ref 26.6–33.0)
MCHC: 33 g/dL (ref 31.5–35.7)
MCV: 102 fL — ABNORMAL HIGH (ref 79–97)
Monocytes Absolute: 0.9 10*3/uL (ref 0.1–0.9)
Monocytes: 11 %
Neutrophils Absolute: 5.7 10*3/uL (ref 1.4–7.0)
Neutrophils: 68 %
Platelets: 258 10*3/uL (ref 150–450)
RBC: 4.32 x10E6/uL (ref 4.14–5.80)
RDW: 13 % (ref 11.6–15.4)
WBC: 8.2 10*3/uL (ref 3.4–10.8)

## 2023-02-17 LAB — CMP14+EGFR
ALT: 16 IU/L (ref 0–44)
AST: 20 IU/L (ref 0–40)
Albumin/Globulin Ratio: 1.5 (ref 1.2–2.2)
Albumin: 4.1 g/dL (ref 3.8–4.8)
Alkaline Phosphatase: 60 IU/L (ref 44–121)
BUN/Creatinine Ratio: 13 (ref 10–24)
BUN: 14 mg/dL (ref 8–27)
Bilirubin Total: 0.4 mg/dL (ref 0.0–1.2)
CO2: 22 mmol/L (ref 20–29)
Calcium: 9.8 mg/dL (ref 8.6–10.2)
Chloride: 100 mmol/L (ref 96–106)
Creatinine, Ser: 1.04 mg/dL (ref 0.76–1.27)
Globulin, Total: 2.7 g/dL (ref 1.5–4.5)
Glucose: 108 mg/dL — ABNORMAL HIGH (ref 70–99)
Potassium: 4.5 mmol/L (ref 3.5–5.2)
Sodium: 138 mmol/L (ref 134–144)
Total Protein: 6.8 g/dL (ref 6.0–8.5)
eGFR: 73 mL/min/{1.73_m2} (ref >59)

## 2023-02-19 LAB — TOXASSURE SELECT 13 (MW), URINE

## 2023-02-26 ENCOUNTER — Other Ambulatory Visit: Payer: Self-pay | Admitting: Nurse Practitioner

## 2023-02-26 DIAGNOSIS — M1A09X0 Idiopathic chronic gout, multiple sites, without tophus (tophi): Secondary | ICD-10-CM

## 2023-05-14 ENCOUNTER — Ambulatory Visit (INDEPENDENT_AMBULATORY_CARE_PROVIDER_SITE_OTHER): Payer: Medicare HMO

## 2023-05-14 VITALS — Ht 68.0 in | Wt 250.0 lb

## 2023-05-14 DIAGNOSIS — Z0000 Encounter for general adult medical examination without abnormal findings: Secondary | ICD-10-CM | POA: Diagnosis not present

## 2023-05-14 NOTE — Progress Notes (Signed)
Subjective:   Joseph [name] is a 82 y.o. male who presents for Medicare Annual/Subsequent preventive examination. I connected with  Joseph [name] on 05/14/23 by a audio enabled telemedicine application and verified that I am speaking with the correct person using two identifiers.  Patient Location: Home  Provider Location: Home Office  I discussed the limitations of evaluation and management by telemedicine. The patient expressed understanding and agreed to proceed.  Review of Systems     Cardiac Risk Factors include: advanced age (>2men, >75 women)     Objective:    Today's Vitals   05/14/23 1324  Weight: 250 lb (113.4 kg)  Height: 5\' 8"  (1.727 m)   Body mass index is 38.01 kg/m.     05/14/2023    1:28 PM 04/25/2022    9:51 AM 04/24/2021   10:06 AM 08/05/2019   10:33 AM 08/03/2018   12:20 PM 06/27/2015    8:32 AM 01/08/2015   10:46 AM  Advanced Directives  Does Patient Have a Medical Advance Directive? No No Yes Yes Yes No No  Type of [place] boy of [place];Living will Healthcare Power of [place];Living will Healthcare Power of [place];Living will    Does patient want to make changes to medical advance directive?    No - Patient declined No - Patient declined    Copy of Healthcare Power of Attorney in Chart?   No - copy requested No - copy requested No - copy requested    Would patient like information on creating a medical advance directive? No - Patient declined No - Patient declined    No - patient declined information Yes - Educational materials given    Current Medications (verified) Outpatient Encounter Medications as of 05/14/2023  Medication Sig   allopurinol (ZYLOPRIM) 300 MG tablet Take 1 tablet by mouth once daily   benazepril (LOTENSIN) 20 MG tablet Take 1 tablet (20 mg total) by mouth daily.   clonazePAM (KLONOPIN) 0.5 MG tablet Take 1 tablet (0.5 mg total) by mouth 2 (two) times daily as needed.   fish oil-omega-3 fatty acids 1000 MG  capsule Take 2 g by mouth daily.   furosemide (LASIX) 40 MG tablet Take 1 tablet (40 mg total) by mouth daily.   meclizine (ANTIVERT) 25 MG tablet Take 1 tablet (25 mg total) by mouth 3 (three) times daily as needed for dizziness.   Melatonin 1 MG CHEW Chew by mouth.   metoprolol succinate (TOPROL-XL) 25 MG 24 hr tablet Take 1 tablet (25 mg total) by mouth daily.   Multiple Vitamins-Minerals (COMPLETE MULTIVITAMIN/MINERAL PO) Take 1 tablet by mouth daily.   NIFEdipine (PROCARDIA-XL/NIFEDICAL-XL) 30 MG 24 hr tablet Take 1 tablet (30 mg total) by mouth daily.   rosuvastatin (CRESTOR) 5 MG tablet Take 1 tablet (5 mg total) by mouth every other day.   traMADol (ULTRAM) 50 MG tablet Take 1 tablet (50 mg total) by mouth 2 (two) times daily.   traZODone (DESYREL) 50 MG tablet Take 0.5-1 tablets (25-50 mg total) by mouth at bedtime as needed. for sleep   escitalopram (LEXAPRO) 10 MG tablet Take 1 tablet (10 mg total) by mouth daily. (Patient not taking: Reported on 05/14/2023)   No facility-administered encounter medications on file as of 05/14/2023.    Allergies (verified) Carafate [sucralfate], Ciprofloxacin hcl, Prevacid [lansoprazole], Quinolones, and Sulfa antibiotics   History: Past Medical History:  Diagnosis Date   Anxiety    Aortic atherosclerosis (HCC)    Ascending aortic  aneurysm (HCC)    CAD (coronary artery disease)    Mild CAD seen on cardiac cath in 2016   Essential hypertension    GERD (gastroesophageal reflux disease)    Gout    History of kidney stones    HLD (hyperlipidemia)    Insomnia    Obesity (BMI 30-39.9)    Prolonged Q-T interval on ECG    Past Surgical History:  Procedure Laterality Date   CARDIAC CATHETERIZATION N/A 06/27/2015   Procedure: Left Heart Cath and Coronary Angiography;  Surgeon: Lennette Bihari, MD;  Location: [facility];  Service: Cardiovascular;  Laterality: N/A;   HERNIA REPAIR     KIDNEY STONE SURGERY     Family History  Problem  Relation Age of Onset   Heart disease Father    Heart attack Father    Diabetes Sister    Heart disease Brother    Deep vein thrombosis Brother    Congestive Heart Failure Brother    Cancer Brother    Leukemia Brother    Heart attack Brother    Social History   Socioeconomic History   Marital status: Significant Other    Spouse name: Not on file   Number of children: 2   Years of education: 10   Highest education level: 10th grade  Occupational History   Occupation: Retired    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, forensic  Tobacco Use   Smoking status: Former    Packs/day: 0.50    Years: 1.00    Additional pack years: 0.00    Total pack years: 0.50    Types: Cigarettes    Quit date: 03/22/1958    Years since quitting: 65.1   Smokeless tobacco: Former    Types: Chew   Tobacco comments:    QUIT CHEWING IN 1989  Vaping Use   Vaping Use: Never used  Substance and Sexual Activity   Alcohol use: No    Alcohol/week: 0.0 standard drinks of alcohol   Drug use: No   Sexual activity: Not on file  Other Topics Concern   Not on file  Social History Narrative   Lives alone, but sometimes Joseph [name], significant other stays with him; He has 2 daughters who visit often   Right handed    Caffeine use: none   Social Determinants of Health   Financial Resource Strain: Low Risk  (05/14/2023)   Overall Financial Resource Strain (CARDIA)    Difficulty of Paying Living Expenses: Not hard at all  Food Insecurity: No Food Insecurity (05/14/2023)   Hunger Vital Sign    Worried About Running Out of Food in the Last Year: Never true    Ran Out of Food in the Last Year: Never true  Transportation Needs: No Transportation Needs (05/14/2023)   PRAPARE - Administrator, Civil Service (Medical): No    Lack of Transportation (Non-Medical): No  Physical Activity: Insufficiently Active (05/14/2023)   Exercise Vital Sign    Days of Exercise per Week: 2 days    Minutes of  Exercise per Session: 20 min  Stress: No Stress Concern Present (05/14/2023)   [employer] of Occupational Health - Occupational Stress Questionnaire    Feeling of Stress : Not at all  Social Connections: Moderately Isolated (05/14/2023)   Social Connection and Isolation Panel [NHANES]    Frequency of Communication with Friends and Family: More than three times a week    Frequency of Social Gatherings with Friends and Family:  More than three times a week    Attends Religious Services: More than 4 times per year    Active Member of Clubs or Organizations: No    Attends Banker Meetings: Never    Marital Status: Widowed    Tobacco Counseling Counseling given: Not Answered Tobacco comments: QUIT CHEWING IN 1989   Clinical Intake:  Pre-visit preparation completed: Yes  Pain : No/denies pain     Nutritional Risks: None Diabetes: No  How often do you need to have someone help you when you read instructions, pamphlets, or other written materials from your doctor or pharmacy?: 1 - Never  Diabetic?no   Interpreter Needed?: No  Information entered by :: Renie Ora, LPN   Activities of Daily Living    05/14/2023    1:29 PM  In your present state of health, do you have any difficulty performing the following activities:  Hearing? 0  Vision? 0  Difficulty concentrating or making decisions? 0  Walking or climbing stairs? 0  Dressing or bathing? 0  Doing errands, shopping? 0  Preparing Food and eating ? N  Using the Toilet? N  In the past six months, have you accidently leaked urine? N  Do you have problems with loss of bowel control? N  Managing your Medications? N  Managing your Finances? N  Housekeeping or managing your Housekeeping? N    Patient Care Team: Bennie Pierini, FNP as PCP - General (Nurse Practitioner) Jonelle Sidle, MD as PCP - Cardiology (Cardiology) Michaelle Copas, MD as Referring Physician (Optometry)  Indicate any  recent Medical Services you may have received from other than [facility] providers in the past year (date may be approximate).     Assessment:   This is a routine wellness examination for Joseph [name].  Hearing/Vision screen Vision Screening - Comments:: Wears rx glasses - up to date with routine eye exams with  Dr.Lee   Dietary issues and exercise activities discussed: Current Exercise Habits: Home exercise routine, Type of exercise: walking, Time (Minutes): 20, Frequency (Times/Week): 2, Weekly Exercise (Minutes/Week): 40, Exercise limited by: orthopedic condition(s)   Goals Addressed             This Visit's Progress    DIET - INCREASE WATER INTAKE         Depression Screen    05/14/2023    1:28 PM 02/16/2023    9:25 AM 02/16/2023    9:07 AM 01/23/2023    2:41 PM 08/18/2022    9:30 AM 04/25/2022    9:50 AM 02/14/2022   12:35 PM  PHQ 2/9 Scores  PHQ - 2 Score 0 5 0 0 1 0 0  PHQ- 9 Score  8 0 0 2  0  Exception Documentation  Patient refusal         Fall Risk    05/14/2023    1:25 PM 02/16/2023    9:07 AM 01/23/2023    2:41 PM 08/18/2022    9:30 AM 04/25/2022    9:47 AM  Fall Risk   Falls in the past year? 1 0 1 1 1   Number falls in past yr: 1  1 0 1  Injury with Fall? 1  0 0 1  Risk for fall due to : History of fall(s);Impaired balance/gait;Orthopedic patient  History of fall(s) History of fall(s) History of fall(s);Impaired balance/gait;Orthopedic patient;Other (Comment)  Risk for fall due to: Comment     neuropathy  Follow up Education provided;Falls prevention discussed  Education  provided Education provided Education provided;Falls prevention discussed    FALL RISK PREVENTION PERTAINING TO THE HOME:  Any stairs in or around the home? Yes  If so, are there any without handrails? No  Home free of loose throw rugs in walkways, pet beds, electrical cords, etc? Yes  Adequate lighting in your home to reduce risk of falls? Yes   ASSISTIVE DEVICES UTILIZED TO PREVENT FALLS:  Life  alert? No  Use of a cane, walker or w/c? Yes  Grab bars in the bathroom? Yes  Shower chair or bench in shower? Yes  Elevated toilet seat or a handicapped toilet? Yes       08/03/2018   12:34 PM  MMSE - Mini Mental State Exam  Orientation to time 5  Orientation to Place 5  Registration 3  Attention/ Calculation 4  Recall 3  Language- name 2 objects 2  Language- repeat 1  Language- follow 3 step command 3  Language- read & follow direction 1  Write a sentence 1  Copy design 1  Total score 29        05/14/2023    1:29 PM 04/25/2022    9:49 AM 08/05/2019   10:34 AM  6CIT Screen  What Year? 0 points 0 points 0 points  What month? 0 points 0 points 0 points  What time? 0 points 0 points 0 points  Count back from 20 0 points 0 points 0 points  Months in reverse 0 points 0 points 4 points  Repeat phrase 2 points 2 points 0 points  Total Score 2 points 2 points 4 points    Immunizations Immunization History  Administered Date(s) Administered   Fluad Quad(high Dose 65+) 08/18/2022   Influenza, High Dose Seasonal PF 09/11/2017, 10/07/2019   Influenza,inj,Quad PF,6+ Mos 10/05/2014, 10/30/2015, 08/28/2016, 10/01/2018   Influenza-Unspecified 08/31/2020, 08/31/2021   Moderna Sars-Covid-2 Vaccination 12/20/2019, 01/17/2020, 10/31/2020   Pneumococcal Conjugate-13 04/18/2015   Pneumococcal Polysaccharide-23 01/02/2014, 02/02/2018   Pneumococcal-Unspecified 02/02/2018   Tdap 10/05/2014   Zoster Recombinat (Shingrix) 02/14/2022, 04/25/2022   Zoster, Live 10/30/2015    TDAP status: Up to date  Flu Vaccine status: Up to date  Pneumococcal vaccine status: Up to date  Covid-19 vaccine status: Completed vaccines  Qualifies for Shingles Vaccine? Yes   Zostavax completed Yes   Shingrix Completed?: Yes  Screening Tests Health Maintenance  Topic Date Due   COVID-19 Vaccine (4 - 2023-24 season) 08/01/2022   INFLUENZA VACCINE  07/02/2023   Medicare Annual Wellness (AWV)   05/13/2024   DTaP/Tdap/Td (2 - Td or Tdap) 10/05/2024   Pneumonia Vaccine 36+ Years old  Completed   Zoster Vaccines- Shingrix  Completed   HPV VACCINES  Aged Out    Health Maintenance  Health Maintenance Due  Topic Date Due   COVID-19 Vaccine (4 - 2023-24 season) 08/01/2022    Colorectal cancer screening: No longer required.   Lung Cancer Screening: (Low Dose CT Chest recommended if Age 58-80 years, 30 pack-year currently smoking OR have quit w/in 15years.) does not qualify.   Lung Cancer Screening Referral: n/a  Additional Screening:  Hepatitis C Screening: does not qualify;   Vision Screening: Recommended annual ophthalmology exams for early detection of glaucoma and other disorders of the eye. Is the patient up to date with their annual eye exam?  Yes  Who is the provider or what is the name of the office in which the patient attends annual eye exams? Dr.Lee  If pt is not established with a  provider, would they like to be referred to a provider to establish care? No .   Dental Screening: Recommended annual dental exams for proper oral hygiene  Community Resource Referral / Chronic Care Management: CRR required this visit?  No   CCM required this visit?  No      Plan:     I have personally reviewed and noted the following in the patient's chart:   Medical and social history Use of alcohol, tobacco or illicit drugs  Current medications and supplements including opioid prescriptions. Patient is not currently taking opioid prescriptions. Functional ability and status Nutritional status Physical activity Advanced directives List of other physicians Hospitalizations, surgeries, and ER visits in previous 12 months Vitals Screenings to include cognitive, depression, and falls Referrals and appointments  In addition, I have reviewed and discussed with patient certain preventive protocols, quality metrics, and best practice recommendations. A written personalized  care plan for preventive services as well as general preventive health recommendations were provided to patient.     Lorrene Reid, LPN   07/29/5620   Nurse Notes: none

## 2023-05-14 NOTE — Patient Instructions (Signed)
Mr. Joseph [name] , Thank you for taking time to come for your Medicare Wellness Visit. I appreciate your ongoing commitment to your health goals. Please review the following plan we discussed and let me know if I can assist you in the future.   These are the goals we discussed:  Goals      Chronic Disease Management Needs     CARE PLAN ENTRY (see longtitudinal plan of care for additional care plan information)  Current Barriers:  Chronic Disease Management support, education, and care coordination needs related to HTN, Aortic aneurysm, BPH, HLD  Clinical Goals: Over the next 10 days, patient will be contacted by a Care Guide to reschedule their Initial CCM Visit Over the next 30 days, patient will have an Initial CCM Visit with a member of the embedded CCM team to discuss self-management of their chronic medical conditions  Interventions: Chart reviewed in preparation for initial visit telephone call Collaboration with other care team members as needed Unsuccessful outreach to patient  A HIPPA compliant phone message was left for the patient providing contact information and requesting a return call.  Request sent to care guides to reach out and reschedule patient's initial visit  Patient Self Care Activities: Undetermined   Initial goal documentation      DIET - INCREASE WATER INTAKE     Exercise 150 min/wk Moderate Activity        This is a list of the screening recommended for you and due dates:  Health Maintenance  Topic Date Due   COVID-19 Vaccine (4 - 2023-24 season) 08/01/2022   Flu Shot  07/02/2023   Medicare Annual Wellness Visit  05/13/2024   DTaP/Tdap/Td vaccine (2 - Td or Tdap) 10/05/2024   Pneumonia Vaccine  Completed   Zoster (Shingles) Vaccine  Completed   HPV Vaccine  Aged Out    Advanced directives: Please bring a copy of your health care power of attorney and living will to the office to be added to your chart at your convenience.   Conditions/risks  identified: Aim for 30 minutes of exercise or brisk walking, 6-8 glasses of water, and 5 servings of fruits and vegetables each day.   Next appointment: Follow up in one year for your annual wellness visit.   Preventive Care 28 Years and Older, Male  Preventive care refers to lifestyle choices and visits with your health care provider that can promote health and wellness. What does preventive care include? A yearly physical exam. This is also called an annual well check. Dental exams once or twice a year. Routine eye exams. Ask your health care provider how often you should have your eyes checked. Personal lifestyle choices, including: Daily care of your teeth and gums. Regular physical activity. Eating a healthy diet. Avoiding tobacco and drug use. Limiting alcohol use. Practicing safe sex. Taking low doses of aspirin every day. Taking vitamin and mineral supplements as recommended by your health care provider. What happens during an annual well check? The services and screenings done by your health care provider during your annual well check will depend on your age, overall health, lifestyle risk factors, and family history of disease. Counseling  Your health care provider may ask you questions about your: Alcohol use. Tobacco use. Drug use. Emotional well-being. Home and relationship well-being. Sexual activity. Eating habits. History of falls. Memory and ability to understand (cognition). Work and work Astronomer. Screening  You may have the following tests or measurements: Height, weight, and BMI. Blood pressure. Lipid and  cholesterol levels. These may be checked every 5 years, or more frequently if you are over 51 years old. Skin check. Lung cancer screening. You may have this screening every year starting at age 38 if you have a 30-pack-year history of smoking and currently smoke or have quit within the past 15 years. Fecal occult blood test (FOBT) of the stool. You  may have this test every year starting at age 48. Flexible sigmoidoscopy or colonoscopy. You may have a sigmoidoscopy every 5 years or a colonoscopy every 10 years starting at age 71. Prostate cancer screening. Recommendations will vary depending on your family history and other risks. Hepatitis C blood test. Hepatitis B blood test. Sexually transmitted disease (STD) testing. Diabetes screening. This is done by checking your blood sugar (glucose) after you have not eaten for a while (fasting). You may have this done every 1-3 years. Abdominal aortic aneurysm (AAA) screening. You may need this if you are a current or former smoker. Osteoporosis. You may be screened starting at age 21 if you are at high risk. Talk with your health care provider about your test results, treatment options, and if necessary, the need for more tests. Vaccines  Your health care provider may recommend certain vaccines, such as: Influenza vaccine. This is recommended every year. Tetanus, diphtheria, and acellular pertussis (Tdap, Td) vaccine. You may need a Td booster every 10 years. Zoster vaccine. You may need this after age 4. Pneumococcal 13-valent conjugate (PCV13) vaccine. One dose is recommended after age 55. Pneumococcal polysaccharide (PPSV23) vaccine. One dose is recommended after age 72. Talk to your health care provider about which screenings and vaccines you need and how often you need them. This information is not intended to replace advice given to you by your health care provider. Make sure you discuss any questions you have with your health care provider. Document Released: 12/14/2015 Document Revised: 08/06/2016 Document Reviewed: 09/18/2015 Elsevier Interactive Patient Education  2017 [employer].  Fall Prevention in the Home Falls can cause injuries. They can happen to people of all ages. There are many things you can do to make your home safe and to help prevent falls. What can I do on the  outside of my home? Regularly fix the edges of walkways and driveways and fix any cracks. Remove anything that might make you trip as you walk through a door, such as a raised step or threshold. Trim any bushes or trees on the path to your home. Use bright outdoor lighting. Clear any walking paths of anything that might make someone trip, such as rocks or tools. Regularly check to see if handrails are loose or broken. Make sure that both sides of any steps have handrails. Any raised decks and porches should have guardrails on the edges. Have any leaves, snow, or ice cleared regularly. Use sand or salt on walking paths during winter. Clean up any spills in your garage right away. This includes oil or grease spills. What can I do in the bathroom? Use night lights. Install grab bars by the toilet and in the tub and shower. Do not use towel bars as grab bars. Use non-skid mats or decals in the tub or shower. If you need to sit down in the shower, use a plastic, non-slip stool. Keep the floor dry. Clean up any water that spills on the floor as soon as it happens. Remove soap buildup in the tub or shower regularly. Attach bath mats securely with double-sided non-slip rug tape. Do not  have throw rugs and other things on the floor that can make you trip. What can I do in the bedroom? Use night lights. Make sure that you have a light by your bed that is easy to reach. Do not use any sheets or blankets that are too big for your bed. They should not hang down onto the floor. Have a firm chair that has side arms. You can use this for support while you get dressed. Do not have throw rugs and other things on the floor that can make you trip. What can I do in the kitchen? Clean up any spills right away. Avoid walking on wet floors. Keep items that you use a lot in easy-to-reach places. If you need to reach something above you, use a strong step stool that has a grab bar. Keep electrical cords out of  the way. Do not use floor polish or wax that makes floors slippery. If you must use wax, use non-skid floor wax. Do not have throw rugs and other things on the floor that can make you trip. What can I do with my stairs? Do not leave any items on the stairs. Make sure that there are handrails on both sides of the stairs and use them. Fix handrails that are broken or loose. Make sure that handrails are as long as the stairways. Check any carpeting to make sure that it is firmly attached to the stairs. Fix any carpet that is loose or worn. Avoid having throw rugs at the top or bottom of the stairs. If you do have throw rugs, attach them to the floor with carpet tape. Make sure that you have a light switch at the top of the stairs and the bottom of the stairs. If you do not have them, ask someone to add them for you. What else can I do to help prevent falls? Wear shoes that: Do not have high heels. Have rubber bottoms. Are comfortable and fit you well. Are closed at the toe. Do not wear sandals. If you use a stepladder: Make sure that it is fully opened. Do not climb a closed stepladder. Make sure that both sides of the stepladder are locked into place. Ask someone to hold it for you, if possible. Clearly mark and make sure that you can see: Any grab bars or handrails. First and last steps. Where the edge of each step is. Use tools that help you move around (mobility aids) if they are needed. These include: Canes. Walkers. Scooters. Crutches. Turn on the lights when you go into a dark area. Replace any light bulbs as soon as they burn out. Set up your furniture so you have a clear path. Avoid moving your furniture around. If any of your floors are uneven, fix them. If there are any pets around you, be aware of where they are. Review your medicines with your doctor. Some medicines can make you feel dizzy. This can increase your chance of falling. Ask your doctor what other things that you  can do to help prevent falls. This information is not intended to replace advice given to you by your health care provider. Make sure you discuss any questions you have with your health care provider. Document Released: 09/13/2009 Document Revised: 04/24/2016 Document Reviewed: 12/22/2014 Elsevier Interactive Patient Education  2017 [employer].

## 2023-05-19 ENCOUNTER — Other Ambulatory Visit: Payer: Self-pay | Admitting: Nurse Practitioner

## 2023-05-19 DIAGNOSIS — G629 Polyneuropathy, unspecified: Secondary | ICD-10-CM

## 2023-05-25 ENCOUNTER — Other Ambulatory Visit: Payer: Self-pay | Admitting: Nurse Practitioner

## 2023-05-25 DIAGNOSIS — G629 Polyneuropathy, unspecified: Secondary | ICD-10-CM

## 2023-05-25 DIAGNOSIS — M1A09X0 Idiopathic chronic gout, multiple sites, without tophus (tophi): Secondary | ICD-10-CM

## 2023-05-26 ENCOUNTER — Ambulatory Visit (INDEPENDENT_AMBULATORY_CARE_PROVIDER_SITE_OTHER): Payer: Medicare HMO | Admitting: Nurse Practitioner

## 2023-05-26 ENCOUNTER — Encounter: Payer: Self-pay | Admitting: Nurse Practitioner

## 2023-05-26 VITALS — BP 134/80 | HR 73 | Temp 98.0°F | Resp 20 | Ht 68.0 in | Wt 257.0 lb

## 2023-05-26 DIAGNOSIS — G629 Polyneuropathy, unspecified: Secondary | ICD-10-CM

## 2023-05-26 DIAGNOSIS — I8393 Asymptomatic varicose veins of bilateral lower extremities: Secondary | ICD-10-CM | POA: Diagnosis not present

## 2023-05-26 DIAGNOSIS — I10 Essential (primary) hypertension: Secondary | ICD-10-CM

## 2023-05-26 DIAGNOSIS — N400 Enlarged prostate without lower urinary tract symptoms: Secondary | ICD-10-CM

## 2023-05-26 DIAGNOSIS — I7300 Raynaud's syndrome without gangrene: Secondary | ICD-10-CM

## 2023-05-26 DIAGNOSIS — R600 Localized edema: Secondary | ICD-10-CM

## 2023-05-26 DIAGNOSIS — R350 Frequency of micturition: Secondary | ICD-10-CM

## 2023-05-26 DIAGNOSIS — E785 Hyperlipidemia, unspecified: Secondary | ICD-10-CM | POA: Diagnosis not present

## 2023-05-26 DIAGNOSIS — I7121 Aneurysm of the ascending aorta, without rupture: Secondary | ICD-10-CM | POA: Diagnosis not present

## 2023-05-26 DIAGNOSIS — F339 Major depressive disorder, recurrent, unspecified: Secondary | ICD-10-CM | POA: Diagnosis not present

## 2023-05-26 DIAGNOSIS — F411 Generalized anxiety disorder: Secondary | ICD-10-CM

## 2023-05-26 DIAGNOSIS — E876 Hypokalemia: Secondary | ICD-10-CM | POA: Diagnosis not present

## 2023-05-26 DIAGNOSIS — M1A09X0 Idiopathic chronic gout, multiple sites, without tophus (tophi): Secondary | ICD-10-CM

## 2023-05-26 DIAGNOSIS — F5101 Primary insomnia: Secondary | ICD-10-CM

## 2023-05-26 LAB — MICROSCOPIC EXAMINATION
Bacteria, UA: NONE SEEN
RBC, Urine: NONE SEEN /hpf (ref 0–2)
Renal Epithel, UA: NONE SEEN /hpf
WBC, UA: NONE SEEN /hpf (ref 0–5)

## 2023-05-26 LAB — URINALYSIS, COMPLETE
Bilirubin, UA: NEGATIVE
Glucose, UA: NEGATIVE
Ketones, UA: NEGATIVE
Leukocytes,UA: NEGATIVE
Nitrite, UA: NEGATIVE
Protein,UA: NEGATIVE
RBC, UA: NEGATIVE
Specific Gravity, UA: 1.015 (ref 1.005–1.030)
Urobilinogen, Ur: 0.2 mg/dL (ref 0.2–1.0)
pH, UA: 5.5 (ref 5.0–7.5)

## 2023-05-26 MED ORDER — TRAMADOL HCL 50 MG PO TABS
50.0000 mg | ORAL_TABLET | Freq: Two times a day (BID) | ORAL | 2 refills | Status: DC
Start: 2023-05-26 — End: 2023-08-18

## 2023-05-26 MED ORDER — CLONAZEPAM 0.5 MG PO TABS
0.5000 mg | ORAL_TABLET | Freq: Two times a day (BID) | ORAL | 3 refills | Status: DC | PRN
Start: 2023-05-26 — End: 2023-10-27

## 2023-05-26 MED ORDER — METOPROLOL SUCCINATE ER 25 MG PO TB24
25.0000 mg | ORAL_TABLET | Freq: Every day | ORAL | 1 refills | Status: DC
Start: 2023-05-26 — End: 2024-02-12

## 2023-05-26 MED ORDER — ESCITALOPRAM OXALATE 10 MG PO TABS
10.0000 mg | ORAL_TABLET | Freq: Every day | ORAL | 1 refills | Status: DC
Start: 2023-05-26 — End: 2024-01-01

## 2023-05-26 MED ORDER — BENAZEPRIL HCL 20 MG PO TABS
20.0000 mg | ORAL_TABLET | Freq: Every day | ORAL | 1 refills | Status: DC
Start: 2023-05-26 — End: 2024-02-12

## 2023-05-26 MED ORDER — NIFEDIPINE ER OSMOTIC RELEASE 30 MG PO TB24
30.0000 mg | ORAL_TABLET | Freq: Every day | ORAL | 1 refills | Status: DC
Start: 2023-05-26 — End: 2023-12-03

## 2023-05-26 MED ORDER — FUROSEMIDE 40 MG PO TABS
40.0000 mg | ORAL_TABLET | Freq: Every day | ORAL | 1 refills | Status: DC
Start: 2023-05-26 — End: 2024-01-01

## 2023-05-26 MED ORDER — ALLOPURINOL 300 MG PO TABS: 300.0000 mg | ORAL_TABLET | Freq: Every day | ORAL | 0 refills | Status: DC

## 2023-05-26 MED ORDER — ROSUVASTATIN CALCIUM 5 MG PO TABS: 5.0000 mg | ORAL_TABLET | ORAL | 1 refills | Status: DC

## 2023-05-26 NOTE — Progress Notes (Signed)
Subjective:    Patient ID: Joseph [name], male    DOB: [birth year], 82 y.o.   MRN: [id number]   Chief Complaint: Medical Management of Chronic Issues    HPI:  Joseph [name] is a 82 y.o. who identifies as a male who was assigned male at birth.   Social history: Lives with: by himself-  Work history: retired   Water engineer in today for follow up of the following chronic medical issues:  1. Frequent urination Started a few weeks ago. Has some trouble getting stream started  2. Primary hypertension No c/o chest pain, sob or headache. Does not check blood pressure at home. BP Readings from Last 3 Encounters:  05/26/23 134/80  02/16/23 133/80  01/23/23 135/76     3. Raynaud's disease without gangrene Does not bother him in the summer tie.  4. Aneurysm of ascending aorta without rupture (HCC) 5. Asymptomatic varicose veins of both lower extremities Has not seen cardiology in awhile  6. Polyneuropathy Has burning in bilateral feet  7. Benign prostatic hyperplasia without lower urinary tract symptoms No voiding issues Lab Results  Component Value Date   PSA1 2.4 01/18/2021   PSA1 3.3 06/29/2018   PSA1 3.0 02/20/2017   PSA 3.3 01/08/2015   PSA 3.4 10/05/2014   PSA 2.9 06/13/2014      8. Hyperlipidemia with target LDL less than 100 Does not watch diet and does no exrcise Lab Results  Component Value Date   CHOL 194 02/16/2023   HDL 38 (L) 02/16/2023   LDLCALC 124 (H) 02/16/2023   TRIG 178 (H) 02/16/2023   CHOLHDL 5.1 (H) 02/16/2023     9. Hypokalemia No c/o muscle cramps Lab Results  Component Value Date   K 4.5 02/16/2023     10. Depression, recurrent (HCC) 11. GAD (generalized anxiety disorder) Is doing well on just klonopin.    02/16/2023    9:08 AM 01/23/2023    2:41 PM 08/18/2022    9:30 AM 02/14/2022   12:36 PM  GAD 7 : Generalized Anxiety Score  Nervous, Anxious, on Edge 1 1 1  0  Control/stop worrying 1 0 1 0  Worry too much - different things 1  0 1 0  Trouble relaxing 1 0 0 0  Restless 1 0 0 0  Easily annoyed or irritable 1 0 0 0  Afraid - awful might happen 1 0 0 0  Total GAD 7 Score 7 1 3  0  Anxiety Difficulty Not difficult at all Not difficult at all Not difficult at all Not difficult at all       05/14/2023    1:28 PM 02/16/2023    9:25 AM 02/16/2023    9:07 AM  Depression screen PHQ 2/9  Decreased Interest 0 3 0  Down, Depressed, Hopeless 0 2 0  PHQ - 2 Score 0 5 0  Altered sleeping  1 0  Tired, decreased energy  1 0  Change in appetite  0 0  Feeling bad or failure about yourself   1 0  Trouble concentrating  0 0  Moving slowly or fidgety/restless  0 0  Suicidal thoughts  0 0  PHQ-9 Score  8 0  Difficult doing work/chores  Not difficult at all Not difficult at all     12. Primary insomnia Is on trazadone and is sleeping well.  13. Peripheral edema Has daily swelling that resolves some at night  14. Gout No recent flare ups  15. Severe obesity (BMI >=  40) (HCC) Weight is up 7lbs Wt Readings from Last 3 Encounters:  05/26/23 257 lb (116.6 kg)  05/14/23 250 lb (113.4 kg)  02/16/23 256 lb (116.1 kg)   BMI Readings from Last 3 Encounters:  05/26/23 39.08 kg/m  05/14/23 38.01 kg/m  02/16/23 38.92 kg/m       New complaints: None today  Allergies  Allergen Reactions   Carafate [Sucralfate] Other (See Comments)    TOOK SKIN FROM HIS MOUTH   Ciprofloxacin Hcl Other (See Comments)    CAN'T REMEMBER   Prevacid [Lansoprazole] Other (See Comments)    MADE HIM FEEL BAD   Quinolones     Patient was warned about not using Cipro and similar antibiotics. Recent studies have raised concern that fluoroquinolone antibiotics could be associated with an increased risk of aortic aneurysm Fluoroquinolones have non-antimicrobial properties that might jeopardise the integrity of the extracellular matrix of the vascular wall In a  propensity score matched cohort study in [place], there was a 66% increased rate  of aortic aneurysm or dissection associated with oral fluoroquinolone use, compared wit   Sulfa Antibiotics Diarrhea    SEVERE   Outpatient Encounter Medications as of 05/26/2023  Medication Sig   allopurinol (ZYLOPRIM) 300 MG tablet Take 1 tablet by mouth once daily   benazepril (LOTENSIN) 20 MG tablet Take 1 tablet (20 mg total) by mouth daily.   clonazePAM (KLONOPIN) 0.5 MG tablet Take 1 tablet (0.5 mg total) by mouth 2 (two) times daily as needed.   fish oil-omega-3 fatty acids 1000 MG capsule Take 2 g by mouth daily.   furosemide (LASIX) 40 MG tablet Take 1 tablet (40 mg total) by mouth daily.   meclizine (ANTIVERT) 25 MG tablet Take 1 tablet (25 mg total) by mouth 3 (three) times daily as needed for dizziness.   Melatonin 1 MG CHEW Chew by mouth.   metoprolol succinate (TOPROL-XL) 25 MG 24 hr tablet Take 1 tablet (25 mg total) by mouth daily.   Multiple Vitamins-Minerals (COMPLETE MULTIVITAMIN/MINERAL PO) Take 1 tablet by mouth daily.   NIFEdipine (PROCARDIA-XL/NIFEDICAL-XL) 30 MG 24 hr tablet Take 1 tablet (30 mg total) by mouth daily.   rosuvastatin (CRESTOR) 5 MG tablet Take 1 tablet (5 mg total) by mouth every other day.   traMADol (ULTRAM) 50 MG tablet Take 1 tablet (50 mg total) by mouth 2 (two) times daily.   traZODone (DESYREL) 50 MG tablet Take 0.5-1 tablets (25-50 mg total) by mouth at bedtime as needed. for sleep   [DISCONTINUED] escitalopram (LEXAPRO) 10 MG tablet Take 1 tablet (10 mg total) by mouth daily.   No facility-administered encounter medications on file as of 05/26/2023.    Past Surgical History:  Procedure Laterality Date   CARDIAC CATHETERIZATION N/A 06/27/2015   Procedure: Left Heart Cath and Coronary Angiography;  Surgeon: Lennette Bihari, MD;  Location: [facility] INVASIVE CV LAB;  Service: Cardiovascular;  Laterality: N/A;   HERNIA REPAIR     KIDNEY STONE SURGERY      Family History  Problem Relation Age of Onset   Heart disease Father    Heart attack Father     Diabetes Sister    Heart disease Brother    Deep vein thrombosis Brother    Congestive Heart Failure Brother    Cancer Brother    Leukemia Brother    Heart attack Brother       Controlled substance contract: n/a     Review of Systems  Constitutional:  Negative for diaphoresis.  Eyes:  Negative for pain.  Respiratory:  Negative for shortness of breath.   Cardiovascular:  Negative for chest pain, palpitations and leg swelling.  Gastrointestinal:  Negative for abdominal pain.  Endocrine: Negative for polydipsia.  Skin:  Negative for rash.  Neurological:  Negative for dizziness, weakness and headaches.  Hematological:  Does not bruise/bleed easily.  All other systems reviewed and are negative.      Objective:   Physical Exam Vitals and nursing note reviewed.  Constitutional:      Appearance: Normal appearance. He is well-developed.  HENT:     Head: Normocephalic.     Nose: Nose normal.     Mouth/Throat:     Mouth: Mucous membranes are moist.     Pharynx: Oropharynx is clear.  Eyes:     Pupils: Pupils are equal, round, and reactive to light.  Neck:     Thyroid: No thyroid mass or thyromegaly.     Vascular: No carotid bruit or JVD.     Trachea: Phonation normal.  Cardiovascular:     Rate and Rhythm: Normal rate and regular rhythm.  Pulmonary:     Effort: Pulmonary effort is normal. No respiratory distress.     Breath sounds: Normal breath sounds.  Abdominal:     General: Bowel sounds are normal.     Palpations: Abdomen is soft.     Tenderness: There is no abdominal tenderness.  Musculoskeletal:        General: Normal range of motion.     Cervical back: Normal range of motion and neck supple.     Right lower leg: Edema (1+) present.     Left lower leg: Edema (1+) present.  Lymphadenopathy:     Cervical: No cervical adenopathy.  Skin:    General: Skin is warm and dry.  Neurological:     Mental Status: He is alert and oriented to person, place, and time.   Psychiatric:        Behavior: Behavior normal.        Thought Content: Thought content normal.        Judgment: Judgment normal.    BP 134/80   Pulse 73   Temp 98 F (36.7 C) (Temporal)   Resp 20   Ht 5\' 8"  (1.727 m)   Wt 257 lb (116.6 kg)   SpO2 93%   BMI 39.08 kg/m         Assessment & Plan:   Joseph [name] comes in today with chief complaint of Medical Management of Chronic Issues   Diagnosis and orders addressed:  1. Frequent urination Urine clear - Urinalysis, Complete - Urine Culture  2. Primary hypertension Low sodium diet - CBC with Differential/Platelet - CMP14+EGFR - benazepril (LOTENSIN) 20 MG tablet; Take 1 tablet (20 mg total) by mouth daily.  Dispense: 90 tablet; Refill: 1 - furosemide (LASIX) 40 MG tablet; Take 1 tablet (40 mg total) by mouth daily.  Dispense: 90 tablet; Refill: 1 - metoprolol succinate (TOPROL-XL) 25 MG 24 hr tablet; Take 1 tablet (25 mg total) by mouth daily.  Dispense: 90 tablet; Refill: 1  3. Raynaud's disease without gangrene Keep hands warm  4. 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  Dispense: 90 tablet; Refill: 1  5. Asymptomatic varicose veins of both lower extremities  6. Polyneuropathy Check feet daily - traMADol (ULTRAM) 50 MG tablet; Take 1 tablet (50 mg total)  by mouth 2 (two) times daily.  Dispense: 60 tablet; Refill: 2  7. Benign prostatic hyperplasia without lower urinary tract symptoms May need to see urologist   8. Hyperlipidemia with target LDL less than 100 Low fat diet - Lipid panel - rosuvastatin (CRESTOR) 5 MG tablet; Take 1 tablet (5 mg total) by mouth every other day.  Dispense: 45 tablet; Refill: 1  9. Hypokalemia Labs pending  10. Depression, recurrent (HCC) 11. GAD (generalized anxiety disorder) Patient doe snot want to take antidepressant - clonazePAM (KLONOPIN) 0.5 MG  tablet; Take 1 tablet (0.5 mg total) by mouth 2 (two) times daily as needed.  Dispense: 60 tablet; Refill: 3 - escitalopram (LEXAPRO) 10 MG tablet; Take 1 tablet (10 mg total) by mouth daily.  Dispense: 90 tablet; Refill: 1  12. Primary insomnia Bedtime routine  13. Peripheral edema Elevate legs when sitting  14. Severe obesity (BMI >= 40) (HCC) Discussed diet and exercise for person with BMI >25 Will recheck weight in 3-6 months   15. Idiopathic chronic gout of multiple sites without tophus - allopurinol (ZYLOPRIM) 300 MG tablet; Take 1 tablet (300 mg total) by mouth daily.  Dispense: 90 tablet; Refill: 0   Labs pending Health Maintenance reviewed Diet and exercise encouraged  Follow up plan: 6 months   Mary-Margaret Daphine Deutscher, FNP

## 2023-05-28 LAB — URINE CULTURE

## 2023-06-08 ENCOUNTER — Other Ambulatory Visit: Payer: Self-pay | Admitting: Cardiothoracic Surgery

## 2023-06-08 DIAGNOSIS — I7121 Aneurysm of the ascending aorta, without rupture: Secondary | ICD-10-CM

## 2023-07-27 ENCOUNTER — Other Ambulatory Visit: Payer: Self-pay | Admitting: Nurse Practitioner

## 2023-07-29 ENCOUNTER — Encounter: Payer: Self-pay | Admitting: Cardiothoracic Surgery

## 2023-08-10 ENCOUNTER — Ambulatory Visit
Admission: RE | Admit: 2023-08-10 | Discharge: 2023-08-10 | Disposition: A | Discharge status: Home / Self Care | Payer: Medicare HMO | Source: Ambulatory Visit | Attending: Cardiothoracic Surgery | Admitting: Cardiothoracic Surgery

## 2023-08-10 ENCOUNTER — Ambulatory Visit: Payer: Medicare HMO | Admitting: Cardiothoracic Surgery

## 2023-08-10 DIAGNOSIS — I700 Atherosclerosis of aorta: Secondary | ICD-10-CM | POA: Diagnosis not present

## 2023-08-10 DIAGNOSIS — I2510 Atherosclerotic heart disease of native coronary artery without angina pectoris: Secondary | ICD-10-CM | POA: Diagnosis not present

## 2023-08-10 DIAGNOSIS — I7121 Aneurysm of the ascending aorta, without rupture: Secondary | ICD-10-CM | POA: Diagnosis not present

## 2023-08-17 ENCOUNTER — Telehealth: Payer: Medicare HMO | Admitting: Cardiothoracic Surgery

## 2023-08-17 NOTE — Telephone Encounter (Signed)
[address]       [place] [address]             [phone]     CARDIOTHORACIC SURGERY TELEPHONE VIRTUAL OFFICE NOTE  Referring Provider is No ref. provider found Primary Cardiologist is Nona Dell, MD PCP is Bennie Pierini, FNP   HPI:  I spoke with Joseph [name] (DOB 08-03-1941 ) via telephone on 08/17/2023 at 2:34 PM and verified that I was speaking with the correct person using more than one form of identification.  We discussed the reason(s) for conducting our visit virtually instead of in-person.  The patient expressed understanding the circumstances and agreed to proceed as described.  1 year follow-up with a CT scan of the chest for an asymptomatic moderate ascending fusiform aneurysm first noted 8 years ago.  Patient denies any chest pain.  Echo cardiogram 2021 shows normal EF and a trileaflet aortic valve without stenosis or insufficiency.  Last year the patient's screening CT scan demonstrated ascending aorta diameter 4.8 cm.  I personally reviewed the scans that were performed earlier this month September 2024 and the ascending aortic diameter is still moderately increased with a diameter of 4.9 cm.  The patient 's blood pressure is controlled on multiple meds including Lopressor, amlodipine, and Lotensin. He saw his primary care physician in June 2024 and his blood pressure was recorded at 134/80.  I discussed with the patient and his family the best treatment for the aneurysm at this point is continued good blood pressure control and surveillance scans every year.  The risk for aortic tear is minimal when the patient is asymptomatic and the diameter is under 5.5.  Patient understands that the risk for repair of his ascending aortic aneurysm would be higher than the risk of dissection at this point.  Best therapy going forward is good blood pressure control and annual surveillance scans which we will continue to arrange.  Current Outpatient  Medications  Medication Sig Dispense Refill   meclizine (ANTIVERT) 25 MG tablet TAKE 1 TABLET BY MOUTH THREE TIMES DAILY AS NEEDED FOR DIZZINESS 30 tablet 1   allopurinol (ZYLOPRIM) 300 MG tablet Take 1 tablet (300 mg total) by mouth daily. 90 tablet 0   benazepril (LOTENSIN) 20 MG tablet Take 1 tablet (20 mg total) by mouth daily. 90 tablet 1   clonazePAM (KLONOPIN) 0.5 MG tablet Take 1 tablet (0.5 mg total) by mouth 2 (two) times daily as needed. 60 tablet 3   escitalopram (LEXAPRO) 10 MG tablet Take 1 tablet (10 mg total) by mouth daily. 90 tablet 1   fish oil-omega-3 fatty acids 1000 MG capsule Take 2 g by mouth daily.     furosemide (LASIX) 40 MG tablet Take 1 tablet (40 mg total) by mouth daily. 90 tablet 1   Melatonin 1 MG CHEW Chew by mouth.     metoprolol succinate (TOPROL-XL) 25 MG 24 hr tablet Take 1 tablet (25 mg total) by mouth daily. 90 tablet 1   Multiple Vitamins-Minerals (COMPLETE MULTIVITAMIN/MINERAL PO) Take 1 tablet by mouth daily.     NIFEdipine (PROCARDIA-XL/NIFEDICAL-XL) 30 MG 24 hr tablet Take 1 tablet (30 mg total) by mouth daily. 90 tablet 1   rosuvastatin (CRESTOR) 5 MG tablet Take 1 tablet (5 mg total) by mouth every other day. 45 tablet 1   traMADol (ULTRAM) 50 MG tablet Take 1 tablet (50 mg total) by mouth 2 (two) times daily. 60 tablet 2   traZODone (DESYREL)  50 MG tablet Take 0.5-1 tablets (25-50 mg total) by mouth at bedtime as needed. for sleep 90 tablet 1   No current facility-administered medications for this visit.     Diagnostic Tests:  CT scan of the chest as noted above with a moderate fusiform ascending aneurysm diameter 4.9 cm.  Best therapy is blood pressure control and surveillance scanning   Impression:  Stable asymptomatic moderate fusiform ascending aneurysm first noted 8 years ago.  Plan:  Patient will be arranged for a follow-up CT scan of the chest without contrast in 1 year.    I discussed limitations of evaluation and management  via telephone.  The patient was advised to call back for repeat telephone consultation or to seek an in-person evaluation if questions arise or the patient's clinical condition changes in any significant manner.  I spent in excess of 10 minutes of non-face-to-face time during the conduct of this telephone virtual office consultation.  Level 1  (99441)             5-10 minutes Level 2  (99442)            11-20 minutes Level 3  (99443)            21-30 minutes   Lovett Sox MD 08/17/2023 2:34 PM

## 2023-08-18 ENCOUNTER — Ambulatory Visit (INDEPENDENT_AMBULATORY_CARE_PROVIDER_SITE_OTHER): Payer: Medicare HMO | Admitting: Nurse Practitioner

## 2023-08-18 ENCOUNTER — Encounter: Payer: Self-pay | Admitting: Nurse Practitioner

## 2023-08-18 VITALS — BP 132/79 | HR 72 | Temp 98.1°F | Resp 20 | Ht 68.0 in | Wt 255.0 lb

## 2023-08-18 DIAGNOSIS — R350 Frequency of micturition: Secondary | ICD-10-CM | POA: Diagnosis not present

## 2023-08-18 DIAGNOSIS — E876 Hypokalemia: Secondary | ICD-10-CM

## 2023-08-18 DIAGNOSIS — I7300 Raynaud's syndrome without gangrene: Secondary | ICD-10-CM

## 2023-08-18 DIAGNOSIS — N400 Enlarged prostate without lower urinary tract symptoms: Secondary | ICD-10-CM

## 2023-08-18 DIAGNOSIS — F5101 Primary insomnia: Secondary | ICD-10-CM

## 2023-08-18 DIAGNOSIS — G629 Polyneuropathy, unspecified: Secondary | ICD-10-CM

## 2023-08-18 DIAGNOSIS — F339 Major depressive disorder, recurrent, unspecified: Secondary | ICD-10-CM

## 2023-08-18 DIAGNOSIS — I10 Essential (primary) hypertension: Secondary | ICD-10-CM

## 2023-08-18 DIAGNOSIS — E785 Hyperlipidemia, unspecified: Secondary | ICD-10-CM

## 2023-08-18 DIAGNOSIS — F411 Generalized anxiety disorder: Secondary | ICD-10-CM

## 2023-08-18 DIAGNOSIS — R600 Localized edema: Secondary | ICD-10-CM | POA: Diagnosis not present

## 2023-08-18 LAB — URINALYSIS, COMPLETE
Bilirubin, UA: NEGATIVE
Glucose, UA: NEGATIVE
Ketones, UA: NEGATIVE
Nitrite, UA: POSITIVE — AB
Protein,UA: NEGATIVE
RBC, UA: NEGATIVE
Specific Gravity, UA: 1.015 (ref 1.005–1.030)
Urobilinogen, Ur: 0.2 mg/dL (ref 0.2–1.0)
pH, UA: 5.5 (ref 5.0–7.5)

## 2023-08-18 LAB — MICROSCOPIC EXAMINATION
Epithelial Cells (non renal): NONE SEEN /HPF (ref 0–10)
RBC, Urine: NONE SEEN /HPF (ref 0–2)
Renal Epithel, UA: NONE SEEN /HPF
Yeast, UA: NONE SEEN

## 2023-08-18 MED ORDER — TRAMADOL HCL 50 MG PO TABS: 50.0000 mg | ORAL_TABLET | Freq: Three times a day (TID) | ORAL | 2 refills | Status: DC | PRN

## 2023-08-18 NOTE — Progress Notes (Signed)
Subjective:    Patient ID: Joseph [name], male    DOB: 1941/11/14, 82 y.o.   MRN: [id number]   Chief Complaint: medical management of chronic issues     HPI:  Joseph [name] is a 82 y.o. who identifies as a male who was assigned male at birth.   Social history: Lives with: by himself- has a girlfriend that he stays with often Work history: retired   Water engineer in today for follow up of the following chronic medical issues:  1. Primary hypertension Np c/o chest pain, sob or headache. Does not check blood pressure at home. BP Readings from Last 3 Encounters:  05/26/23 134/80  02/16/23 133/80  01/23/23 135/76     2. Aneurysm of ascending aorta without rupture (HCC) Last chest CT was done on 08/10/23. Aneurysm had gronw from 4.8cm to 5.0 cm. recommend repeat ct in 6 months  3. Asymptomatic varicose veins of both lower extremities Legs just feel heavy at times but otherwise do not bother him.  4. Raynaud's disease without gangrene DOes not bother him in the summer  5. Hyperlipidemia with target LDL less than 100 Does not watch diet very closely and does no exercise at all. Lab Results  Component Value Date   CHOL 194 02/16/2023   HDL 38 (L) 02/16/2023   LDLCALC 124 (H) 02/16/2023   TRIG 178 (H) 02/16/2023   CHOLHDL 5.1 (H) 02/16/2023      6. Hypokalemia No recent muscle cramps Lab Results  Component Value Date   K 4.5 02/16/2023     7. Polyneuropathy Feet hurt and burn all the time. Takes ultram daily  Pain assessment: Cause of pain- DDD Pain location- lower back Pain on scale of 1-10- 8/10 Frequency- daily What increases pain-to much activity What makes pain Better-pain meds help Effects on ADL - none Any change in general medical condition-none  Current opioids rx- ultram # meds rx- 60 Effectiveness of current meds-helps Adverse reactions from pain meds-none Morphine equivalent- 10 MME  Pill count performed-No Last drug screen - 02/16/23 ( high risk  q28m, moderate risk q34m, low risk yearly ) Urine drug screen today- No Was the [facility] reviewed- yes  If yes were their any concerning findings? - no   Overdose risk: 1    07/05/2019   12:01 PM  Opioid Risk   Alcohol 0  Illegal Drugs 0  Rx Drugs 0  Alcohol 0  Illegal Drugs 0  Rx Drugs 0  Age between 16-45 years  0  History of Preadolescent Sexual Abuse 0  Psychological Disease 0  Depression 0  Opioid Risk Tool Scoring 0  Opioid Risk Interpretation Low Risk     Pain contract signed on: 02/18/23   8. Benign prostatic hyperplasia without lower urinary tract symptoms No voiding issues Lab Results  Component Value Date   PSA1 2.4 01/18/2021   PSA1 3.3 06/29/2018   PSA1 3.0 02/20/2017   PSA 3.3 01/08/2015   PSA 3.4 10/05/2014   PSA 2.9 06/13/2014      9. Depression, recurrent (HCC) Has had severe depression since his wife died >then 5 years ago. He is on lexapro. Some days are good and some days are a struggle.    08/18/2023    9:56 AM 05/26/2023    1:40 PM 05/14/2023    1:28 PM  Depression screen PHQ 2/9  Decreased Interest 0 0 0  Down, Depressed, Hopeless 0 0 0  PHQ - 2 Score 0 0 0  Altered sleeping 0 0   Tired, decreased energy 0 0   Change in appetite 0 0   Feeling bad or failure about yourself  0 0   Trouble concentrating 0 0   Moving slowly or fidgety/restless 0 0   Suicidal thoughts 0 0   PHQ-9 Score 0 0   Difficult doing work/chores Not difficult at all Not difficult at all      10. GAD (generalized anxiety disorder) Is on klonopin 2x a day. Gets very nervous without meds    08/18/2023    9:56 AM 05/26/2023    1:41 PM 02/16/2023    9:08 AM 01/23/2023    2:41 PM  GAD 7 : Generalized Anxiety Score  Nervous, Anxious, on Edge 1 0 1 1  Control/stop worrying 0 0 1 0  Worry too much - different things 1 0 1 0  Trouble relaxing 0 0 1 0  Restless 0 0 1 0  Easily annoyed or irritable 0 0 1 0  Afraid - awful might happen 0 0 1 0  Total GAD 7 Score 2 0 7 1   Anxiety Difficulty Not difficult at all Not difficult at all Not difficult at all Not difficult at all      11. Peripheral edema Has daily edema of lower ext.  12. Primary insomnia Is on trazadone to sleep. Sleep good most nights  13. Idiopathic chronic gout of multiple sites without tophus No recent gout flareup.  14. Severe obesity (BMI >= 40) (HCC) No recent weight changes Wt Readings from Last 3 Encounters:  08/18/23 255 lb (115.7 kg)  05/26/23 257 lb (116.6 kg)  05/14/23 250 lb (113.4 kg)   BMI Readings from Last 3 Encounters:  08/18/23 38.77 kg/m  05/26/23 39.08 kg/m  05/14/23 38.01 kg/m     New complaints: - urinary frequency- always wants urine checked - patient feel 3 weeks ago and hurt his left hand. Sore along 4th and 5th metacarpal. Able to make fist and use hand.  Allergies  Allergen Reactions   Carafate [Sucralfate] Other (See Comments)    TOOK SKIN FROM HIS MOUTH   Ciprofloxacin Hcl Other (See Comments)    CAN'T REMEMBER   Prevacid [Lansoprazole] Other (See Comments)    MADE HIM FEEL BAD   Quinolones     Patient was warned about not using Cipro and similar antibiotics. Recent studies have raised concern that fluoroquinolone antibiotics could be associated with an increased risk of aortic aneurysm Fluoroquinolones have non-antimicrobial properties that might jeopardise the integrity of the extracellular matrix of the vascular wall In a  propensity score matched cohort study in [place], there was a 66% increased rate of aortic aneurysm or dissection associated with oral fluoroquinolone use, compared wit   Sulfa Antibiotics Diarrhea    SEVERE   Outpatient Encounter Medications as of 08/18/2023  Medication Sig   allopurinol (ZYLOPRIM) 300 MG tablet Take 1 tablet (300 mg total) by mouth daily.   benazepril (LOTENSIN) 20 MG tablet Take 1 tablet (20 mg total) by mouth daily.   clonazePAM (KLONOPIN) 0.5 MG tablet Take 1 tablet (0.5 mg total) by mouth 2  (two) times daily as needed.   escitalopram (LEXAPRO) 10 MG tablet Take 1 tablet (10 mg total) by mouth daily.   fish oil-omega-3 fatty acids 1000 MG capsule Take 2 g by mouth daily.   furosemide (LASIX) 40 MG tablet Take 1 tablet (40 mg total) by mouth daily.   meclizine (ANTIVERT) 25 MG tablet TAKE  1 TABLET BY MOUTH THREE TIMES DAILY AS NEEDED FOR DIZZINESS   Melatonin 1 MG CHEW Chew by mouth.   metoprolol succinate (TOPROL-XL) 25 MG 24 hr tablet Take 1 tablet (25 mg total) by mouth daily.   Multiple Vitamins-Minerals (COMPLETE MULTIVITAMIN/MINERAL PO) Take 1 tablet by mouth daily.   NIFEdipine (PROCARDIA-XL/NIFEDICAL-XL) 30 MG 24 hr tablet Take 1 tablet (30 mg total) by mouth daily.   rosuvastatin (CRESTOR) 5 MG tablet Take 1 tablet (5 mg total) by mouth every other day.   traMADol (ULTRAM) 50 MG tablet Take 1 tablet (50 mg total) by mouth 2 (two) times daily.   traZODone (DESYREL) 50 MG tablet Take 0.5-1 tablets (25-50 mg total) by mouth at bedtime as needed. for sleep   No facility-administered encounter medications on file as of 08/18/2023.    Past Surgical History:  Procedure Laterality Date   CARDIAC CATHETERIZATION N/A 06/27/2015   Procedure: Left Heart Cath and Coronary Angiography;  Surgeon: Lennette Bihari, MD;  Location: [facility] INVASIVE CV LAB;  Service: Cardiovascular;  Laterality: N/A;   HERNIA REPAIR     KIDNEY STONE SURGERY      Family History  Problem Relation Age of Onset   Heart disease Father    Heart attack Father    Diabetes Sister    Heart disease Brother    Deep vein thrombosis Brother    Congestive Heart Failure Brother    Cancer Brother    Leukemia Brother    Heart attack Brother       Controlled substance contract: n/a     Review of Systems  Constitutional:  Negative for diaphoresis.  Eyes:  Negative for pain.  Respiratory:  Negative for shortness of breath.   Cardiovascular:  Negative for chest pain, palpitations and leg swelling.   Gastrointestinal:  Negative for abdominal pain.  Endocrine: Negative for polydipsia.  Skin:  Negative for rash.  Neurological:  Negative for dizziness, weakness and headaches.  Hematological:  Does not bruise/bleed easily.  All other systems reviewed and are negative.      Objective:   Physical Exam Vitals and nursing note reviewed.  Constitutional:      Appearance: Normal appearance. He is well-developed.  HENT:     Head: Normocephalic.     Nose: Nose normal.     Mouth/Throat:     Mouth: Mucous membranes are moist.     Pharynx: Oropharynx is clear.  Eyes:     Pupils: Pupils are equal, round, and reactive to light.  Neck:     Thyroid: No thyroid mass or thyromegaly.     Vascular: No carotid bruit or JVD.     Trachea: Phonation normal.  Cardiovascular:     Rate and Rhythm: Normal rate and regular rhythm.  Pulmonary:     Effort: Pulmonary effort is normal. No respiratory distress.     Breath sounds: Normal breath sounds.  Abdominal:     General: Bowel sounds are normal.     Palpations: Abdomen is soft.     Tenderness: There is no abdominal tenderness.  Musculoskeletal:        General: Normal range of motion.     Cervical back: Normal range of motion and neck supple.     Comments: FROM of left hand without pain. Fist strong  Lymphadenopathy:     Cervical: No cervical adenopathy.  Skin:    General: Skin is warm and dry.  Neurological:     Mental Status: He is alert and oriented to  person, place, and time.  Psychiatric:        Behavior: Behavior normal.        Thought Content: Thought content normal.        Judgment: Judgment normal.     BP 132/79   Pulse 72   Temp 98.1 F (36.7 C) (Temporal)   Resp 20   Ht 5\' 8"  (1.727 m)   Wt 255 lb (115.7 kg)   SpO2 96%   BMI 38.77 kg/m   Urine is clear      Assessment & Plan:  Joseph [name] comes in today with chief complaint of Medical Management of Chronic Issues   Diagnosis and orders addressed:  1. Primary  hypertension Low sodium diet - CBC with Differential/Platelet - CMP14+EGFR  2. 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 Asymptomatic varicose veins of both lower extremities Compression socks  4. Raynaud's disease without gangrene Keep hands and toes warm  5. Hyperlipidemia with target LDL less than 100 Low fat diet - Lipid panel  6. Hypokalemia Labs oending  7. Polyneuropathy Check feet daily Increased ultram to 3x prn - traMADol (ULTRAM) 50 MG tablet; Take 1 tablet (50 mg total) by mouth 3 (three) times daily as needed.  Dispense: 90 tablet; Refill: 2  8. Benign prostatic hyperplasia without lower urinary tract symptoms Report any voiding issues  9. Depression, recurrent (HCC) Stress management  10. GAD (generalized anxiety disorder)   11. Peripheral edema Elevate legs when sitting  12. Primary insomnia Bedtime routine  13. Idiopathic chronic gout of multiple sites without tophus Low purine diet  14. Severe obesity (BMI >= 40) (HCC) Discussed diet and exercise for person with BMI >25 Will recheck weight in 3-6 months   15. Frequent urination Force fluids - Urinalysis, Complete - Urine Culture   Labs pending Health Maintenance reviewed Diet and exercise encouraged  Follow up plan: 3 months- pain management   Mary-Margaret Daphine Deutscher, FNP

## 2023-08-18 NOTE — Patient Instructions (Signed)
Tramadol Tablets What is this medication? TRAMADOL (TRA ma dole) treats severe pain. It is prescribed when other pain medications have not worked or cannot be tolerated. It works by blocking pain signals in the brain. It belongs to a group of medications called opioids. This medicine may be used for other purposes; ask your health care provider or pharmacist if you have questions. COMMON BRAND NAME(S): Ultram What should I tell my care team before I take this medication? They need to know if you have any of these conditions: Brain tumor Frequently drink alcohol Head injury Kidney disease Liver disease Lung or breathing disease, such as asthma Seizures Stomach or intestine problems Substance use disorder Suicidal thoughts, plans, or attempt by you or a family member Taken an MAOI, such as Marplan, Nardil, or Parnate in the last 14 days An unusual or allergic reaction to tramadol, other medications, foods, dyes, or preservatives Pregnant or trying to get pregnant Breastfeeding How should I use this medication? Take this medication by mouth with a full glass of water. Take it as directed on the prescription label. You can take it with or without food. If it upsets your stomach, take it with food. Do not take it more often than directed. A special MedGuide will be given to you by the pharmacist with each prescription and refill. Be sure to read this information carefully each time. Talk to your care team about the use of this medication in children. Special care may be needed. Overdosage: If you think you have taken too much of this medicine contact a poison control center or emergency room at once. NOTE: This medicine is only for you. Do not share this medicine with others. What if I miss a dose? If you miss a dose, take it as soon as you can. If it is almost time for your next dose, take only that dose. Do not take double or extra doses. What may interact with this medication? Do not take  this medication with any of the following: Linezolid MAOIs, such as Marplan, Nardil, and Parnate Methylene blue Ozanimod This medication may also interact with the following: Alcohol Antihistamines for allergy, cough, and cold Atropine Certain antibiotics, such as erythromycin, clarithromycin, rifampin Certain antivirals for HIV or hepatitis Certain medications for anxiety or sleep Certain medications for bladder problems, such as oxybutynin, tolterodine Certain medications for depression, such as amitriptyline, bupropion, fluoxetine, paroxetine, sertraline Certain medications for fungal infections, such as ketoconazole, itraconazole, or posaconazole Certain medications for migraine headache, such as almotriptan, eletriptan, frovatriptan, naratriptan, rizatriptan, sumatriptan, zolmitriptan Certain medications for Parkinson disease, such as benztropine, trihexyphenidyl Certain medications for seizures, such as carbamazepine, phenobarbital, primidone Certain medications for stomach problems, such as dicyclomine, hyoscyamine Certain medications for travel sickness, such as scopolamine Digoxin Diuretics General anesthetics, such as halothane, isoflurane, methoxyflurane, propofol Ipratropium Medications that relax muscles for surgery Other opioid medications for pain Phenothiazines, such as chlorpromazine, mesoridazine, prochlorperazine, thioridazine Quinidine Warfarin This list may not describe all possible interactions. Give your health care provider a list of all the medicines, herbs, non-prescription drugs, or dietary supplements you use. Also tell them if you smoke, drink alcohol, or use illegal drugs. Some items may interact with your medicine. What should I watch for while using this medication? Tell your care team if your pain does not go away, if it gets worse, or if you have new or a different type of pain. You may develop tolerance to this medication. Tolerance means that you  will need a higher  dose of the medication for pain relief. Tolerance is normal and is expected if you take this medication for a long time. Taking this medication with other substances that cause drowsiness, such as alcohol, benzodiazepines, or other opioids can cause serious side effects. Give your care team a list of all medications you use. They will tell you how much medication to take. Do not take more medication than directed. Call emergency services if you have problems breathing or staying awake. Long term use of this medication may cause your brain and body to depend on it. This can happen even when used as directed by your care team. You and your care team will work together to determine how long you will need to take this medication. If your care team wants you to stop this medication, the dose will be slowly lowered over time to reduce the risk of side effects. Naloxone is an emergency medication used for an opioid overdose. An overdose can happen if you take too much of an opioid. It can also happen if an opioid is taken with some other medications or substances such as alcohol. Know the symptoms of an overdose, such as trouble breathing, unusually tired or sleepy, or not being able to respond or wake up. Make sure to tell caregivers and close contacts where your naloxone is stored. Make sure they know how to use it. After naloxone is given, the person giving it must call emergency services. Naloxone is a temporary treatment. Repeat doses may be needed. This medication may affect your coordination, reaction time, or judgment. Do not drive or operate machinery until you know how this medication affects you. Sit up or stand slowly to reduce the risk of dizzy or fainting spells. Drinking alcohol with this medication can increase the risk of these side effects. This medication may cause serious skin reactions. They can happen weeks to months after starting the medication. Contact your care team right  away if you notice fevers or flu-like symptoms with a rash. The rash may be red or purple and then turn into blisters or peeling of the skin. Or, you might notice a red rash with swelling of the face, lips, or lymph nodes in your neck or under your arms. This medication will cause constipation. If you do not have a bowel movement for 3 days, call your care team. Your mouth may get dry. Chewing sugarless gum or sucking hard candy and drinking plenty of water may help. Contact your care team if the problem does not go away or is severe. Talk to your care team if you may be pregnant. Prolonged use of this medication during pregnancy can cause temporary withdrawal in a newborn. Talk to your care team before breastfeeding. Changes to your treatment plan may be needed. If you breastfeed while taking this medication, seek medical care right away if you notice the child has slow or noisy breathing, is unusually sleepy or not able to wake up, or is limp. Long-term use of this medication may cause infertility. Talk to your care team if you are concerned about your fertility. What side effects may I notice from receiving this medication? Side effects that you should report to your care team as soon as possible: Allergic reactions--skin rash, itching, hives, swelling of the face, lips, tongue, or throat CNS depression--slow or shallow breathing, shortness of breath, feeling faint, dizziness, confusion, difficulty staying awake Low adrenal gland function--nausea, vomiting, loss of appetite, unusual weakness or fatigue, dizziness Low blood pressure--dizziness, feeling faint  or lightheaded, blurry vision Low blood sugar (hypoglycemia)--tremors or shaking, anxiety, sweating, cold or clammy skin, confusion, dizziness, rapid heartbeat Low sodium level--muscle weakness, fatigue, dizziness, headache, confusion Redness, blistering, peeling, or loosening of the skin, including inside the mouth Seizures Side effects that  usually do not require medical attention (report to your care team if they continue or are bothersome): Constipation Dizziness Drowsiness Dry mouth Headache Nausea Vomiting This list may not describe all possible side effects. Call your doctor for medical advice about side effects. You may report side effects to FDA at 1-800-FDA-1088. Where should I keep my medication? Keep out of the reach of children and pets. This medication can be abused. Keep it in a safe place to protect it from theft. Do not share it with anyone. It is only for you. Selling or giving away this medication is dangerous and against the law. Store at room temperature between 20 and 25 degrees C (68 and 77 degrees F). Get rid of any unused medication after the expiration date. This medication may cause harm and death if it is taken by other adults, children, or pets. It is important to get rid of the medication as soon as you no longer need it, or it is expired. You can do this in two ways: Take the medication to a medication take-back program. Check with your pharmacy or law enforcement to find a location. If you cannot return the medication, check the label or package insert to see if the medication should be thrown out in the garbage or flushed down the toilet. If you are not sure, ask your care team. If it is safe to put it in the trash, take the medication out of the container. Mix the medication with cat litter, dirt, coffee grounds, or other unwanted substance. Seal the mixture in a bag or container. Put it in the trash. NOTE: This sheet is a summary. It may not cover all possible information. If you have questions about this medicine, talk to your doctor, pharmacist, or health care provider.  2024 Elsevier/Gold Standard (2022-12-18 00:00:00)

## 2023-08-21 ENCOUNTER — Other Ambulatory Visit: Payer: Self-pay | Admitting: Nurse Practitioner

## 2023-08-21 DIAGNOSIS — F5101 Primary insomnia: Secondary | ICD-10-CM

## 2023-08-22 LAB — URINE CULTURE

## 2023-08-24 MED ORDER — DOXYCYCLINE HYCLATE 100 MG PO TABS: 100.0000 mg | ORAL_TABLET | Freq: Two times a day (BID) | ORAL | 0 refills | Status: DC

## 2023-08-24 NOTE — Addendum Note (Signed)
Addended by: Bennie Pierini on: 08/24/2023 01:51 PM   Modules accepted: Orders

## 2023-09-07 DIAGNOSIS — K6289 Other specified diseases of anus and rectum: Secondary | ICD-10-CM | POA: Diagnosis not present

## 2023-09-07 DIAGNOSIS — I7300 Raynaud's syndrome without gangrene: Secondary | ICD-10-CM | POA: Diagnosis not present

## 2023-09-07 DIAGNOSIS — N400 Enlarged prostate without lower urinary tract symptoms: Secondary | ICD-10-CM | POA: Diagnosis not present

## 2023-09-07 DIAGNOSIS — F32A Depression, unspecified: Secondary | ICD-10-CM | POA: Diagnosis not present

## 2023-09-07 DIAGNOSIS — E785 Hyperlipidemia, unspecified: Secondary | ICD-10-CM | POA: Diagnosis not present

## 2023-09-07 DIAGNOSIS — R39198 Other difficulties with micturition: Secondary | ICD-10-CM | POA: Diagnosis not present

## 2023-09-07 DIAGNOSIS — K5900 Constipation, unspecified: Secondary | ICD-10-CM | POA: Diagnosis not present

## 2023-09-07 DIAGNOSIS — E119 Type 2 diabetes mellitus without complications: Secondary | ICD-10-CM | POA: Diagnosis not present

## 2023-09-07 DIAGNOSIS — I10 Essential (primary) hypertension: Secondary | ICD-10-CM | POA: Diagnosis not present

## 2023-10-21 ENCOUNTER — Encounter: Payer: Self-pay | Admitting: Family Medicine

## 2023-10-21 ENCOUNTER — Ambulatory Visit: Payer: Medicare HMO | Admitting: Family Medicine

## 2023-10-21 ENCOUNTER — Telehealth: Payer: Self-pay | Admitting: Nurse Practitioner

## 2023-10-21 VITALS — BP 131/88 | HR 86 | Temp 98.9°F | Resp 20 | Ht 68.0 in | Wt 235.0 lb

## 2023-10-21 DIAGNOSIS — N3001 Acute cystitis with hematuria: Secondary | ICD-10-CM

## 2023-10-21 DIAGNOSIS — R300 Dysuria: Secondary | ICD-10-CM | POA: Diagnosis not present

## 2023-10-21 DIAGNOSIS — N390 Urinary tract infection, site not specified: Secondary | ICD-10-CM

## 2023-10-21 LAB — URINALYSIS, COMPLETE
Bilirubin, UA: NEGATIVE
Glucose, UA: NEGATIVE
Ketones, UA: NEGATIVE
Nitrite, UA: NEGATIVE
Specific Gravity, UA: 1.02 (ref 1.005–1.030)
Urobilinogen, Ur: 0.2 mg/dL (ref 0.2–1.0)
pH, UA: 6.5 (ref 5.0–7.5)

## 2023-10-21 LAB — MICROSCOPIC EXAMINATION
Epithelial Cells (non renal): NONE SEEN /[HPF] (ref 0–10)
Renal Epithel, UA: NONE SEEN /[HPF]
WBC, UA: >30 /[HPF] — AB (ref 0–5)
Yeast, UA: NONE SEEN

## 2023-10-21 MED ORDER — FOSFOMYCIN TROMETHAMINE 3 G PO PACK: 3.0000 g | PACK | ORAL | 0 refills | Status: AC

## 2023-10-21 NOTE — Telephone Encounter (Signed)
Elmarie Mainland (Key: Verne Grain) PA Case ID #: [id number] Rx #: [id number] Need Help? Call us at [phone] Status sent iconSent to Plan today Drug Fosfomycin Tromethamine 3GM packets ePA cloud logo Form [facility] Electronic PA Form Original Claim Info [phone]

## 2023-10-21 NOTE — Telephone Encounter (Signed)
Copied from CRM [id number]. Topic: Clinical - Prescription Issue >> Oct 21, 2023  1:49 PM Prudencio Pair wrote: Reason for CRM: Belenda Cruise, with [facility], called to let provider know that the prescription, Fosfomycin powder, that she(Dr. Rakes) sent for pt is not covered by insurance. Requires a PA. Belenda Cruise wanted to know if Dr. Reginia Forts would like to prescribe Bactrim, Macrobid, or Cephalexin. Please contact Belenda Cruise back at [phone] to let her know.

## 2023-10-21 NOTE — Progress Notes (Addendum)
Subjective:  Patient ID: Joseph [name], male    DOB: 02-May-1941, 82 y.o.   MRN: [id number]  Patient Care Team: Bennie Pierini, FNP as PCP - General (Nurse Practitioner) Jonelle Sidle, MD as PCP - Cardiology (Cardiology) Michaelle Copas, MD as Referring Physician (Optometry)   Chief Complaint:  Dysuria   HPI: Joseph [name] is a 82 y.o. male presenting on 10/21/2023 for Dysuria   Discussed the use of AI scribe software for clinical note transcription with the patient, who gave verbal consent to proceed.  History of Present Illness   Joseph [name], an 82 year old with a history of urinary tract infections, presents with dysuria that started approximately a week ago. The patient describes the sensation as a burning pain during urination. This is not the first occurrence of such symptoms, as he had a similar episode about one to two months ago, which was treated with doxycycline. The patient denies having any recent colds or other illnesses in the past few months.  In addition to the dysuria, the patient also reports lower abdominal pressure and constipation, which he manages with occasional use of a 0.5 mg laxative pill. He denies any fever, chills, or confusion. A few months ago, the patient noticed blood in his stool and urine, but these symptoms have since resolved.  The patient has a history of allergies to several antibiotics, including Cipro, which caused severe reactions in the past. He also reports diarrhea with sulfa drugs. Despite these allergies, the patient confirms he is not allergic to penicillins.  The patient's lifestyle includes attempts to limit caffeine intake, with a preference for caffeine-free beverages. Despite his age and health issues, the patient maintains an active lifestyle.          Relevant past medical, surgical, family, and social history reviewed and updated as indicated.  Allergies and medications reviewed and updated. Data reviewed: Chart in  Epic.   Past Medical History:  Diagnosis Date   Anxiety    Aortic atherosclerosis (HCC)    Ascending aortic aneurysm (HCC)    CAD (coronary artery disease)    Mild CAD seen on cardiac cath in 2016   Essential hypertension    GERD (gastroesophageal reflux disease)    Gout    History of kidney stones    HLD (hyperlipidemia)    Insomnia    Obesity (BMI 30-39.9)    Prolonged Q-T interval on ECG     Past Surgical History:  Procedure Laterality Date   CARDIAC CATHETERIZATION N/A 06/27/2015   Procedure: Left Heart Cath and Coronary Angiography;  Surgeon: Lennette Bihari, MD;  Location: [facility];  Service: Cardiovascular;  Laterality: N/A;   HERNIA REPAIR     KIDNEY STONE SURGERY      Social History   Socioeconomic History   Marital status: Significant Other    Spouse name: Not on file   Number of children: 2   Years of education: 10   Highest education level: 10th grade  Occupational History   Occupation: Retired    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, forensic  Tobacco Use   Smoking status: Former    Current packs/day: 0.00    Average packs/day: 0.5 packs/day for 1 year (0.5 ttl pk-yrs)    Types: Cigarettes    Start date: 03/22/1957    Quit date: 03/22/1958    Years since quitting: 65.6   Smokeless tobacco: Former    Types: Chew   Tobacco comments:  QUIT CHEWING IN 1989  Vaping Use   Vaping status: Never Used  Substance and Sexual Activity   Alcohol use: No    Alcohol/week: 0.0 standard drinks of alcohol   Drug use: No   Sexual activity: Not on file  Other Topics Concern   Not on file  Social History Narrative   Lives alone, but sometimes Roxy Manns, significant other stays with him; He has 2 daughters who visit often   Right handed    Caffeine use: none   Social Determinants of Health   Financial Resource Strain: Low Risk  (05/14/2023)   Overall Financial Resource Strain (CARDIA)    Difficulty of Paying Living Expenses: Not hard at all   Food Insecurity: No Food Insecurity (05/14/2023)   Hunger Vital Sign    Worried About Running Out of Food in the Last Year: Never true    Ran Out of Food in the Last Year: Never true  Transportation Needs: No Transportation Needs (05/14/2023)   PRAPARE - Administrator, Civil Service (Medical): No    Lack of Transportation (Non-Medical): No  Physical Activity: Insufficiently Active (05/14/2023)   Exercise Vital Sign    Days of Exercise per Week: 2 days    Minutes of Exercise per Session: 20 min  Stress: No Stress Concern Present (05/14/2023)   [employer] of Occupational Health - Occupational Stress Questionnaire    Feeling of Stress : Not at all  Social Connections: Moderately Isolated (05/14/2023)   Social Connection and Isolation Panel [NHANES]    Frequency of Communication with Friends and Family: More than three times a week    Frequency of Social Gatherings with Friends and Family: More than three times a week    Attends Religious Services: More than 4 times per year    Active Member of [employer] or Organizations: No    Attends Banker Meetings: Never    Marital Status: Widowed  Intimate Partner Violence: Not At Risk (05/14/2023)   Humiliation, Afraid, Rape, and Kick questionnaire    Fear of Current or Ex-Partner: No    Emotionally Abused: No    Physically Abused: No    Sexually Abused: No    Outpatient Encounter Medications as of 10/21/2023  Medication Sig   allopurinol (ZYLOPRIM) 300 MG tablet Take 1 tablet (300 mg total) by mouth daily.   benazepril (LOTENSIN) 20 MG tablet Take 1 tablet (20 mg total) by mouth daily.   clonazePAM (KLONOPIN) 0.5 MG tablet Take 1 tablet (0.5 mg total) by mouth 2 (two) times daily as needed.   fish oil-omega-3 fatty acids 1000 MG capsule Take 2 g by mouth daily.   fosfomycin (MONUROL) 3 g PACK Take 3 g by mouth every 3 (three) days for 2 doses.   meclizine (ANTIVERT) 25 MG tablet TAKE 1 TABLET BY MOUTH THREE TIMES  DAILY AS NEEDED FOR DIZZINESS   Melatonin 1 MG CHEW Chew by mouth.   metoprolol succinate (TOPROL-XL) 25 MG 24 hr tablet Take 1 tablet (25 mg total) by mouth daily.   Multiple Vitamins-Minerals (COMPLETE MULTIVITAMIN/MINERAL PO) Take 1 tablet by mouth daily.   NIFEdipine (PROCARDIA-XL/NIFEDICAL-XL) 30 MG 24 hr tablet Take 1 tablet (30 mg total) by mouth daily.   rosuvastatin (CRESTOR) 5 MG tablet Take 1 tablet (5 mg total) by mouth every other day.   traMADol (ULTRAM) 50 MG tablet Take 1 tablet (50 mg total) by mouth 3 (three) times daily as needed.   traZODone (DESYREL) 50 MG tablet  TAKE 1/2 TO 1 (ONE-HALF TO ONE) TABLET BY MOUTH AT BEDTIME AS NEEDED FOR SLEEP   escitalopram (LEXAPRO) 10 MG tablet Take 1 tablet (10 mg total) by mouth daily. (Patient not taking: Reported on 10/21/2023)   furosemide (LASIX) 40 MG tablet Take 1 tablet (40 mg total) by mouth daily. (Patient not taking: Reported on 10/21/2023)   [DISCONTINUED] doxycycline (VIBRA-TABS) 100 MG tablet Take 1 tablet (100 mg total) by mouth 2 (two) times daily. 1 po bid   No facility-administered encounter medications on file as of 10/21/2023.    Allergies  Allergen Reactions   Carafate [Sucralfate] Other (See Comments)    TOOK SKIN FROM HIS MOUTH   Ciprofloxacin Hcl Other (See Comments)    CAN'T REMEMBER   Prevacid [Lansoprazole] Other (See Comments)    MADE HIM FEEL BAD   Quinolones     Patient was warned about not using Cipro and similar antibiotics. Recent studies have raised concern that fluoroquinolone antibiotics could be associated with an increased risk of aortic aneurysm Fluoroquinolones have non-antimicrobial properties that might jeopardise the integrity of the extracellular matrix of the vascular wall In a  propensity score matched cohort study in [place], there was a 66% increased rate of aortic aneurysm or dissection associated with oral fluoroquinolone use, compared wit   Sulfa Antibiotics Diarrhea    SEVERE     Pertinent ROS per HPI, otherwise unremarkable      Objective:  BP 131/88   Pulse 86   Temp 98.9 F (37.2 C) (Oral)   Resp 20   Ht 5\' 8"  (1.727 m)   Wt 235 lb (106.6 kg)   SpO2 96%   BMI 35.73 kg/m    Wt Readings from Last 3 Encounters:  10/21/23 235 lb (106.6 kg)  08/18/23 255 lb (115.7 kg)  05/26/23 257 lb (116.6 kg)    Physical Exam Vitals and nursing note reviewed.  Constitutional:      General: He is not in acute distress.    Appearance: Normal appearance. He is obese. He is not ill-appearing, toxic-appearing or diaphoretic.  HENT:     Head: Normocephalic and atraumatic.     Mouth/Throat:     Mouth: Mucous membranes are moist.  Eyes:     Pupils: Pupils are equal, round, and reactive to light.  Cardiovascular:     Rate and Rhythm: Normal rate and regular rhythm.     Heart sounds: Normal heart sounds.  Pulmonary:     Effort: Pulmonary effort is normal.     Breath sounds: Normal breath sounds.  Abdominal:     General: Bowel sounds are normal.     Palpations: Abdomen is soft.     Tenderness: There is no abdominal tenderness. There is no right CVA tenderness or left CVA tenderness.  Skin:    General: Skin is warm and dry.     Capillary Refill: Capillary refill takes less than 2 seconds.  Neurological:     General: No focal deficit present.     Mental Status: He is alert and oriented to person, place, and time.     Gait: Gait abnormal (antalgic, using cane).  Psychiatric:        Mood and Affect: Mood normal.        Behavior: Behavior normal.        Thought Content: Thought content normal.        Judgment: Judgment normal.    Physical Exam   ABDOMEN: No costovertebral angle tenderness upon percussion.  Results for orders placed or performed in visit on 08/18/23  Urine Culture   Specimen: Urine   UR  Result Value Ref Range   Urine Culture, Routine Final report (A)    Organism ID, Bacteria Comment (A)    Antimicrobial Susceptibility Comment    Microscopic Examination   Urine  Result Value Ref Range   WBC, UA 0-5 0 - 5 /hpf   RBC, Urine None seen 0 - 2 /hpf   Epithelial Cells (non renal) None seen 0 - 10 /hpf   Renal Epithel, UA None seen None seen /hpf   Bacteria, UA Moderate (A) None seen/Few   Yeast, UA None seen None seen  Urinalysis, Complete  Result Value Ref Range   Specific Gravity, UA 1.015 1.005 - 1.030   pH, UA 5.5 5.0 - 7.5   Color, UA Yellow Yellow   Appearance Ur Clear Clear   Leukocytes,UA Trace (A) Negative   Protein,UA Negative Negative/Trace   Glucose, UA Negative Negative   Ketones, UA Negative Negative   RBC, UA Negative Negative   Bilirubin, UA Negative Negative   Urobilinogen, Ur 0.2 0.2 - 1.0 mg/dL   Nitrite, UA Positive (A) Negative   Microscopic Examination See below:        Pertinent labs & imaging results that were available during my care of the patient were reviewed by me and considered in my medical decision making.  Assessment & Plan:  Joseph [name] was seen today for dysuria.  Diagnoses and all orders for this visit:  Dysuria -     Urinalysis, Complete -     Urine Culture  Acute cystitis with hematuria -     fosfomycin (MONUROL) 3 g PACK; Take 3 g by mouth every 3 (three) days for 2 doses.  Complicated UTI (urinary tract infection) -     fosfomycin (MONUROL) 3 g PACK; Take 3 g by mouth every 3 (three) days for 2 doses.     Assessment and Plan    Urinary Tract Infection (UTI) Recurrent UTI with dysuria and lower abdominal pressure for 2-3 days. No fever, chills, or flank pain. Multiple antibiotic allergies including Cipro and sulfa drugs with severe reactions. Previous treatment with doxycycline. Discussed importance of completing antibiotics and hydration. Potential need to change antibiotics based on urine culture results. - Start antibiotics based on previous urine culture - Send urine culture for current infection - Advise increased water intake - Complete full course of  antibiotics - Follow up in two weeks for repeat urinalysis  Constipation Intermittent constipation managed with 0.5 mg of unspecified medication every other day. No severe symptoms reported. - Continue current constipation management regimen  General Health Maintenance 82 year old maintaining a healthy lifestyle by avoiding caffeine and drinking decaffeinated beverages. - Encourage continued avoidance of caffeine - Promote hydration and balanced diet.          Continue all other maintenance medications.  Follow up plan: Return if symptoms worsen or fail to improve.   Continue healthy lifestyle choices, including diet (rich in fruits, vegetables, and lean proteins, and low in salt and simple carbohydrates) and exercise (at least 30 minutes of moderate physical activity daily).  Educational handout given for UTI  The above assessment and management plan was discussed with the patient. The patient verbalized understanding of and has agreed to the management plan. Patient is aware to call the clinic if they develop any new symptoms or if symptoms persist or worsen. Patient is aware when to return to the  clinic for a follow-up visit. Patient educated on when it is appropriate to go to the emergency department.   Kari Baars, FNP-C [facility] [phone]

## 2023-10-22 ENCOUNTER — Other Ambulatory Visit (HOSPITAL_COMMUNITY): Payer: Self-pay

## 2023-10-22 ENCOUNTER — Other Ambulatory Visit: Payer: Self-pay | Admitting: Family Medicine

## 2023-10-22 NOTE — Telephone Encounter (Unsigned)
Copied from CRM [id number]. Topic: Clinical - Medication Question >> Oct 22, 2023 11:19 AM Clayton Bibles wrote: This Note is from 10/21/23:Reason for CRM: Reason for CRM: Belenda Cruise, with [facility], called to let provider know that the prescription, Fosfomycin powder, that she(Dr. Rakes) sent for pt is not covered by insurance. Requires a PA. Belenda Cruise wanted to know if Dr. Reginia Forts would like to prescribe Bactrim, Macrobid, or Cephalexin. Please contact Belenda Cruise back at [phone] to let her know.  Keelen called in today because he has questions about sending in new med to [place]. Please call Isamu after 1:30 [phone] or [phone]

## 2023-10-22 NOTE — Telephone Encounter (Signed)
PA pending

## 2023-10-22 NOTE — Telephone Encounter (Signed)
Pharmacy Patient Advocate Encounter  Received notification from [facility] that Prior Authorization for Fosfomycin Tromethamine 3GM packets has been APPROVED from 12/01/22 to 11/30/24. Ran test claim, Copay is $95. This test claim was processed through [facility]- copay amounts may vary at other pharmacies due to pharmacy/plan contracts, or as the patient moves through the different stages of their insurance plan.   PA #/Case ID/Reference #:  [id number]

## 2023-10-25 LAB — URINE CULTURE

## 2023-10-27 ENCOUNTER — Other Ambulatory Visit: Payer: Self-pay | Admitting: Nurse Practitioner

## 2023-10-27 DIAGNOSIS — F411 Generalized anxiety disorder: Secondary | ICD-10-CM

## 2023-10-31 ENCOUNTER — Other Ambulatory Visit: Payer: Self-pay | Admitting: Nurse Practitioner

## 2023-10-31 DIAGNOSIS — G629 Polyneuropathy, unspecified: Secondary | ICD-10-CM

## 2023-11-03 ENCOUNTER — Other Ambulatory Visit: Payer: Self-pay | Admitting: Nurse Practitioner

## 2023-11-03 DIAGNOSIS — F5101 Primary insomnia: Secondary | ICD-10-CM

## 2023-11-09 ENCOUNTER — Telehealth: Payer: Self-pay | Admitting: Family Medicine

## 2023-11-09 DIAGNOSIS — R300 Dysuria: Secondary | ICD-10-CM

## 2023-11-09 NOTE — Telephone Encounter (Signed)
Patient saw Joseph [name] a few weeks ago and she had advised him to come back in 2 weeks for a repeat urine culture to make sure all infection has cleared up.  Patient reports he is still having urinary frequency so I scheduled him a lab appointment tomorrow morning and also entered orders for a urinalysis and a urine culture.

## 2023-11-09 NOTE — Telephone Encounter (Signed)
Copied from CRM [id number]. Topic: Clinical - Medication Question >> Nov 09, 2023  2:50 PM Colletta Maryland S wrote: Reason for CRM: pt had infection that was seen 2-3 weeks ago that has now returned, pt is inquiring about rx being sent to pharmacy, callback number is [phone]

## 2023-11-10 ENCOUNTER — Other Ambulatory Visit: Payer: Self-pay | Admitting: Family Medicine

## 2023-11-10 ENCOUNTER — Other Ambulatory Visit: Payer: Medicare HMO

## 2023-11-10 DIAGNOSIS — R300 Dysuria: Secondary | ICD-10-CM | POA: Diagnosis not present

## 2023-11-10 DIAGNOSIS — N390 Urinary tract infection, site not specified: Secondary | ICD-10-CM

## 2023-11-10 LAB — URINALYSIS, COMPLETE
Bilirubin, UA: NEGATIVE
Glucose, UA: NEGATIVE
Ketones, UA: NEGATIVE
Nitrite, UA: POSITIVE — AB
Specific Gravity, UA: 1.015 (ref 1.005–1.030)
Urobilinogen, Ur: 0.2 mg/dL (ref 0.2–1.0)
pH, UA: 5 (ref 5.0–7.5)

## 2023-11-10 LAB — MICROSCOPIC EXAMINATION: WBC, UA: >30 /[HPF] — ABNORMAL HIGH (ref 0–5)

## 2023-11-12 ENCOUNTER — Telehealth: Payer: Self-pay | Admitting: Family Medicine

## 2023-11-12 NOTE — Telephone Encounter (Signed)
Copied from CRM [id number]. Topic: Clinical - Lab/Test Results >> Nov 12, 2023  2:12 PM Chantha C wrote: Reason for CRM: Pt wants to know his culture results. Pt c/o hurting really bad and having to go every minute. Pt wants to get rx today to last him until he goes to the urologist 12/16/23. Pls c/b at [phone] or [phone] [facility] [address], [place] - [address] [place] [address] [phone]

## 2023-11-13 NOTE — Telephone Encounter (Signed)
Lmtcb.

## 2023-11-14 LAB — URINE CULTURE

## 2023-11-16 ENCOUNTER — Other Ambulatory Visit: Payer: Self-pay | Admitting: Family Medicine

## 2023-11-16 DIAGNOSIS — N390 Urinary tract infection, site not specified: Secondary | ICD-10-CM

## 2023-11-16 MED ORDER — FOSFOMYCIN TROMETHAMINE 3 G PO PACK: 3.0000 g | PACK | ORAL | 0 refills | Status: AC

## 2023-11-17 ENCOUNTER — Encounter: Payer: Self-pay | Admitting: Nurse Practitioner

## 2023-11-17 ENCOUNTER — Ambulatory Visit (INDEPENDENT_AMBULATORY_CARE_PROVIDER_SITE_OTHER): Payer: Medicare HMO | Admitting: Nurse Practitioner

## 2023-11-17 VITALS — BP 154/72 | HR 100 | Temp 97.8°F | Ht 68.0 in | Wt 235.0 lb

## 2023-11-17 DIAGNOSIS — G629 Polyneuropathy, unspecified: Secondary | ICD-10-CM

## 2023-11-17 DIAGNOSIS — R300 Dysuria: Secondary | ICD-10-CM | POA: Diagnosis not present

## 2023-11-17 DIAGNOSIS — F5101 Primary insomnia: Secondary | ICD-10-CM | POA: Diagnosis not present

## 2023-11-17 DIAGNOSIS — N390 Urinary tract infection, site not specified: Secondary | ICD-10-CM | POA: Diagnosis not present

## 2023-11-17 LAB — MICROSCOPIC EXAMINATION
Renal Epithel, UA: NONE SEEN /[HPF]
WBC, UA: >30 /[HPF] — AB (ref 0–5)
Yeast, UA: NONE SEEN

## 2023-11-17 LAB — URINALYSIS, COMPLETE
Bilirubin, UA: NEGATIVE
Glucose, UA: NEGATIVE
Ketones, UA: NEGATIVE
Nitrite, UA: NEGATIVE
Specific Gravity, UA: 1.015 (ref 1.005–1.030)
Urobilinogen, Ur: 0.2 mg/dL (ref 0.2–1.0)
pH, UA: 6 (ref 5.0–7.5)

## 2023-11-17 MED ORDER — TRAZODONE HCL 50 MG PO TABS: 100.0000 mg | ORAL_TABLET | Freq: Every day | ORAL | 1 refills | Status: DC

## 2023-11-17 MED ORDER — TRAMADOL HCL 50 MG PO TABS: 50.0000 mg | ORAL_TABLET | Freq: Three times a day (TID) | ORAL | 2 refills | Status: DC | PRN

## 2023-11-17 MED ORDER — NITROFURANTOIN MONOHYD MACRO 100 MG PO CAPS: 100.0000 mg | ORAL_CAPSULE | Freq: Two times a day (BID) | ORAL | 0 refills | Status: DC

## 2023-11-17 NOTE — Progress Notes (Signed)
Subjective:    Patient ID: Joseph [name], male    DOB: [birth year], 82 y.o.   MRN: [id number]   Chief Complaint: polyneuropathy- pain management  HPI Pain assessment: Cause of pain- polyneuropathy and back pain Pain location- multiple places as well as lower back Pain on scale of 1-10- 7-8/10 Frequency- daily What increases pain-to much standing and walking What makes pain Better-rest helps sometimes Effects on ADL - none Any change in general medical condition-none  Current opioids rx- ultram TID # meds rx- 90 Effectiveness of current meds-helps Adverse reactions from pain meds-none Morphine equivalent- 15 MME  Pill count performed-No Last drug screen - 02/16/24 ( high risk q28m, moderate risk q22m, low risk yearly ) Urine drug screen today- No Was the NCCSR reviewed- yes  If yes were their any concerning findings? - no   Overdose risk: 1    07/05/2019   12:01 PM  Opioid Risk   Alcohol 0  Illegal Drugs 0  Rx Drugs 0  Alcohol 0  Illegal Drugs 0  Rx Drugs 0  Age between 16-45 years  0  History of Preadolescent Sexual Abuse 0  Psychological Disease 0  Depression 0  Opioid Risk Tool Scoring 0  Opioid Risk Interpretation Low Risk    Patient is also c/o dysuria that started several days ago.burns bad when he voids. Has to go frequently. Up at night several times  to void. He saw M. Rakes,FNP and was given monurol. He says he is no better.  Pain contract signed on:02/18/24  Patient Active Problem List   Diagnosis Date Noted   Depression, recurrent (HCC) 02/16/2023   Peripheral edema 02/14/2022   GAD (generalized anxiety disorder) 04/17/2021   Polyneuropathy 06/07/2019   Hypokalemia 10/30/2015   Thoracic ascending aortic aneurysm (HCC) 07/27/2015   Varicose veins of both lower extremities 07/27/2015   Abnormal myocardial perfusion study    Severe obesity (BMI >= 40) (HCC) 04/18/2015   Raynauds disease 04/18/2015   BPH (benign prostatic hyperplasia) 07/10/2014    Gout 04/04/2014   Hypertension 03/22/2013   Hyperlipidemia with target LDL less than 100 03/22/2013   Insomnia 03/22/2013       Review of Systems  Constitutional:  Negative for diaphoresis.  Eyes:  Negative for pain.  Respiratory:  Negative for shortness of breath.   Cardiovascular:  Negative for chest pain, palpitations and leg swelling.  Gastrointestinal:  Negative for abdominal pain.  Endocrine: Negative for polydipsia.  Genitourinary:  Positive for dysuria, frequency and urgency.  Skin:  Negative for rash.  Neurological:  Negative for dizziness, weakness and headaches.  Hematological:  Does not bruise/bleed easily.  All other systems reviewed and are negative.      Objective:   Physical Exam Constitutional:      Appearance: Normal appearance. He is obese.  Cardiovascular:     Rate and Rhythm: Normal rate and regular rhythm.     Heart sounds: Normal heart sounds.  Pulmonary:     Effort: Pulmonary effort is normal.     Breath sounds: Normal breath sounds.  Abdominal:     Tenderness: There is abdominal tenderness (mild suprapuic pain on palpation). There is no right CVA tenderness or left CVA tenderness.  Genitourinary:    Penis: Normal.      Testes: Normal.  Skin:    General: Skin is warm.  Neurological:     General: No focal deficit present.     Mental Status: He is alert and oriented to person, place, and  time.  Psychiatric:        Mood and Affect: Mood normal.        Behavior: Behavior normal.     BP (!) 154/72   Pulse 100   Temp 97.8 F (36.6 C) (Temporal)   Ht 5\' 8"  (1.727 m)   Wt 235 lb (106.6 kg)   SpO2 91%   BMI 35.73 kg/m         Assessment & Plan:   Joseph [name] comes in today with chief complaint of Pain Management   Diagnosis and orders addressed:  1. Polyneuropathy (Primary) Keep warm - traMADol (ULTRAM) 50 MG tablet; Take 1 tablet (50 mg total) by mouth 3 (three) times daily as needed.  Dispense: 90 tablet; Refill: 2  2.  Dysuria - Urinalysis, Complete - Urine Culture  3. Primary insomnia Increased trazadone to 2 tablets qhs - traZODone (DESYREL) 50 MG tablet; Take 2 tablets (100 mg total) by mouth at bedtime.  Dispense: 180 tablet; Refill: 1  4. Recurrent UTI Take medication as prescribe Cotton underwear Take shower not bath Cranberry juice, yogurt Force fluids AZO over the counter X2 days Culture pending RTO prn  - nitrofurantoin, macrocrystal-monohydrate, (MACROBID) 100 MG capsule; Take 1 capsule (100 mg total) by mouth 2 (two) times daily.  Dispense: 20 capsule; Refill: 0   Labs pending Health Maintenance reviewed Diet and exercise encouraged  Follow up plan: 3 months   Mary-Margaret Daphine Deutscher, FNP

## 2023-11-17 NOTE — Patient Instructions (Signed)
Take medication as prescribe Cotton underwear Take shower not bath Cranberry juice, yogurt Force fluids AZO over the counter X2 days Culture pending RTO prn  

## 2023-11-18 LAB — URINE CULTURE

## 2023-11-23 ENCOUNTER — Ambulatory Visit: Payer: Medicare HMO | Admitting: Nurse Practitioner

## 2023-11-23 NOTE — Telephone Encounter (Signed)
Patient was seen in office since telephone call

## 2023-11-30 NOTE — Progress Notes (Signed)
Unable to contact patient after several attempts

## 2023-12-02 ENCOUNTER — Other Ambulatory Visit: Payer: Self-pay | Admitting: Nurse Practitioner

## 2023-12-02 DIAGNOSIS — M1A09X0 Idiopathic chronic gout, multiple sites, without tophus (tophi): Secondary | ICD-10-CM

## 2023-12-03 ENCOUNTER — Other Ambulatory Visit: Payer: Self-pay | Admitting: Nurse Practitioner

## 2023-12-03 DIAGNOSIS — I7121 Aneurysm of the ascending aorta, without rupture: Secondary | ICD-10-CM

## 2023-12-03 DIAGNOSIS — F411 Generalized anxiety disorder: Secondary | ICD-10-CM

## 2023-12-04 ENCOUNTER — Other Ambulatory Visit: Payer: Self-pay | Admitting: Nurse Practitioner

## 2023-12-04 DIAGNOSIS — F411 Generalized anxiety disorder: Secondary | ICD-10-CM

## 2023-12-06 ENCOUNTER — Other Ambulatory Visit: Payer: Self-pay | Admitting: Nurse Practitioner

## 2023-12-06 DIAGNOSIS — F411 Generalized anxiety disorder: Secondary | ICD-10-CM

## 2023-12-10 NOTE — Progress Notes (Signed)
 Name: Joseph [name] DOB: 04-17-1941 MRN: [id number]  History of Present Illness: [name] is a 83 y.o. male who presents today for new patient appointment at [facility] [place].  - GU history: 1. BPH. - PSA was normal on 03/22/2013 (3.68); no subsequent PSA results found per chart review. 2. Kidney stone (recurrent). No recent relevant imaging per chart review.  He reports chief complaint of recurrent UTI.  Urine culture results in past 12 months: - 05/26/2023: Negative - 08/18/2023: Positive for >100k Staphylococcus lugdunensis; treated with Doxycycline  - 10/21/2023: Positive for >100k Staphylococcus lugdunensis; treated with Fosfomycin 3 g - 11/10/2023: Positive for >100k Staphylococcus lugdunensis; treated with Fosfomycin 3 g - 11/17/2023: Negative; treated with Nitrofurantoin    Imaging: No recent relevant imaging found per chart review.  Today: He reports that when UTIs occur symptoms include dysuria, increased urgency, frequency. Denies UTI symptoms today.  At baseline he reports urinary urgency, frequency, nocturia. Denies dysuria, gross hematuria, variable urinary stream, hesitancy, straining to void, or sensations of incomplete emptying. Denies prior episode(s) of acute urinary retention.  He denies any suspected recent kidney stones.  He denies flank pain or abdominal pain.   He reports chronic constipation. Takes laxatives PRN. Reports that he had an episode of fecal impaction a few months ago and had to undergo manual disimpaction in the ER in [place].   Reports history of intermittent redness, warmth, and swelling at the tip of his penis. He is uncircumcised. Reports that he tries to routinely retract the foreskin and gently cleanse the area / keep it dry to prevent infection.   Fall Screening: Do you usually have a device to assist in your mobility? Yes - cane   Medications: Current Outpatient Medications  Medication Sig Dispense Refill    alfuzosin  (UROXATRAL ) 10 MG 24 hr tablet Take 1 tablet (10 mg total) by mouth daily with breakfast. 30 tablet 2   allopurinol  (ZYLOPRIM ) 300 MG tablet Take 1 tablet by mouth once daily 90 tablet 0   benazepril  (LOTENSIN ) 20 MG tablet Take 1 tablet (20 mg total) by mouth daily. 90 tablet 1   clonazePAM  (KLONOPIN ) 0.5 MG tablet Take 1 tablet by mouth twice daily as needed 60 tablet 0   clotrimazole  (CLOTRIMAZOLE  AF) 1 % cream Apply to head of penis 2 times per day as needed for balanitis (redness / warmth / swelling of foreskin). 180 g 0   fish oil-omega-3 fatty acids 1000 MG capsule Take 2 g by mouth daily.     meclizine  (ANTIVERT ) 25 MG tablet TAKE 1 TABLET BY MOUTH THREE TIMES DAILY AS NEEDED FOR DIZZINESS 30 tablet 1   Melatonin 1 MG CHEW Chew by mouth.     metoprolol  succinate (TOPROL -XL) 25 MG 24 hr tablet Take 1 tablet (25 mg total) by mouth daily. 90 tablet 1   Multiple Vitamins-Minerals (COMPLETE MULTIVITAMIN/MINERAL PO) Take 1 tablet by mouth daily.     NIFEdipine  (PROCARDIA -XL/NIFEDICAL-XL) 30 MG 24 hr tablet Take 1 tablet by mouth once daily 90 tablet 0   rosuvastatin  (CRESTOR ) 5 MG tablet Take 1 tablet (5 mg total) by mouth every other day. 45 tablet 1   traMADol  (ULTRAM ) 50 MG tablet Take 1 tablet (50 mg total) by mouth 3 (three) times daily as needed. 90 tablet 2   traZODone  (DESYREL ) 50 MG tablet Take 2 tablets (100 mg total) by mouth at bedtime. 180 tablet 1   escitalopram  (LEXAPRO ) 10 MG tablet Take 1 tablet (10 mg total) by mouth  daily. (Patient not taking: Reported on 12/16/2023) 90 tablet 1   furosemide  (LASIX ) 40 MG tablet Take 1 tablet (40 mg total) by mouth daily. 90 tablet 1   nitrofurantoin , macrocrystal-monohydrate, (MACROBID ) 100 MG capsule Take 1 capsule (100 mg total) by mouth 2 (two) times daily. (Patient not taking: Reported on 12/16/2023) 20 capsule 0   No current facility-administered medications for this visit.    Allergies: Allergies  Allergen Reactions    Carafate [Sucralfate] Other (See Comments)    TOOK SKIN FROM HIS MOUTH   Ciprofloxacin Hcl Other (See Comments)    CAN'T REMEMBER   Prevacid [Lansoprazole] Other (See Comments)    MADE HIM FEEL BAD   Quinolones     Patient was warned about not using Cipro and similar antibiotics. Recent studies have raised concern that fluoroquinolone antibiotics could be associated with an increased risk of aortic aneurysm Fluoroquinolones have non-antimicrobial properties that might jeopardise the integrity of the extracellular matrix of the vascular wall In a  propensity score matched cohort study in [place], there was a 66% increased rate of aortic aneurysm or dissection associated with oral fluoroquinolone use, compared wit   Sulfa Antibiotics Diarrhea    SEVERE    Past Medical History:  Diagnosis Date   Anxiety    Aortic atherosclerosis (HCC)    Ascending aortic aneurysm (HCC)    CAD (coronary artery disease)    Mild CAD seen on cardiac cath in 2016   Essential hypertension    GERD (gastroesophageal reflux disease)    Gout    History of kidney stones    HLD (hyperlipidemia)    Insomnia    Obesity (BMI 30-39.9)    Prolonged Q-T interval on ECG    Past Surgical History:  Procedure Laterality Date   CARDIAC CATHETERIZATION N/A 06/27/2015   Procedure: Left Heart Cath and Coronary Angiography;  Surgeon: Millicent Ally, MD;  Location: [facility];  Service: Cardiovascular;  Laterality: N/A;   HERNIA REPAIR     KIDNEY STONE SURGERY     Family History  Problem Relation Age of Onset   Heart disease Father    Heart attack Father    Diabetes Sister    Heart disease Brother    Deep vein thrombosis Brother    Congestive Heart Failure Brother    Cancer Brother    Leukemia Brother    Heart attack Brother    Social History   Socioeconomic History   Marital status: Significant Other    Spouse name: Not on file   Number of children: 2   Years of education: 10   Highest education  level: 10th grade  Occupational History   Occupation: Retired    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, forensic  Tobacco Use   Smoking status: Former    Current packs/day: 0.00    Average packs/day: 0.5 packs/day for 1 year (0.5 ttl pk-yrs)    Types: Cigarettes    Start date: 03/22/1957    Quit date: 03/22/1958    Years since quitting: 65.7   Smokeless tobacco: Former    Types: Chew   Tobacco comments:    QUIT CHEWING IN 1989  Vaping Use   Vaping status: Never Used  Substance and Sexual Activity   Alcohol use: No    Alcohol/week: 0.0 standard drinks of alcohol   Drug use: No   Sexual activity: Not on file  Other Topics Concern   Not on file  Social History Narrative   Lives  alone, but sometimes [place] [facility], significant other stays with him; He has 2 daughters who visit often   Right handed    Caffeine use: none   Social Drivers of Corporate investment banker Strain: Low Risk  (05/14/2023)   Overall Financial Resource Strain (CARDIA)    Difficulty of Paying Living Expenses: Not hard at all  Food Insecurity: No Food Insecurity (05/14/2023)   Hunger Vital Sign    Worried About Running Out of Food in the Last Year: Never true    Ran Out of Food in the Last Year: Never true  Transportation Needs: No Transportation Needs (05/14/2023)   PRAPARE - Administrator, Civil Service (Medical): No    Lack of Transportation (Non-Medical): No  Physical Activity: Insufficiently Active (05/14/2023)   Exercise Vital Sign    Days of Exercise per Week: 2 days    Minutes of Exercise per Session: 20 min  Stress: No Stress Concern Present (05/14/2023)   [employer] of Occupational Health - Occupational Stress Questionnaire    Feeling of Stress : Not at all  Social Connections: Moderately Isolated (05/14/2023)   Social Connection and Isolation Panel [NHANES]    Frequency of Communication with Friends and Family: More than three times a week    Frequency of Social Gatherings  with Friends and Family: More than three times a week    Attends Religious Services: More than 4 times per year    Active Member of [employer] or Organizations: No    Attends Banker Meetings: Never    Marital Status: Widowed  Intimate Partner Violence: Not At Risk (05/14/2023)   Humiliation, Afraid, Rape, and Kick questionnaire    Fear of Current or Ex-Partner: No    Emotionally Abused: No    Physically Abused: No    Sexually Abused: No    Review of Systems Constitutional: Patient denies any unintentional weight loss or change in strength lntegumentary: Patient denies any rashes or pruritus Cardiovascular: Patient denies chest pain or syncope Respiratory: Patient denies shortness of breath Gastrointestinal: Patient denies nausea, vomiting, or diarrhea Musculoskeletal: Patient denies muscle cramps or weakness Neurologic: Patient denies convulsions or seizures Allergic/Immunologic: Patient denies recent allergic reaction(s) Hematologic/Lymphatic: Patient denies bleeding tendencies Endocrine: Patient denies heat/cold intolerance  GU: As per HPI.  OBJECTIVE Vitals:   12/16/23 1018  BP: (!) 142/73  Pulse: 83  Temp: 97.8 F (36.6 C)   There is no height or weight on file to calculate BMI.  Physical Examination Constitutional: No obvious distress; patient is non-toxic appearing  Cardiovascular: No visible lower extremity edema.  Respiratory: The patient does not have audible wheezing/stridor; respirations do not appear labored  Gastrointestinal: Abdomen non-distended Musculoskeletal: Normal ROM of UEs  Skin: No obvious rashes/open sores  Neurologic: CN 2-12 grossly intact Psychiatric: Answered questions appropriately with normal affect  Hematologic/Lymphatic/Immunologic: No obvious bruises or sites of spontaneous bleeding  Urine microscopy: negative  PVR: 58 ml  ASSESSMENT Recurrent UTI - Plan: Urinalysis, Routine w reflex microscopic, BLADDER SCAN AMB  NON-IMAGING, PR COMPLEX UROFLOMETRY, US  RENAL, DG Abd 1 View  Complicated UTI (urinary tract infection) - Plan: US  RENAL  Benign prostatic hyperplasia, unspecified whether lower urinary tract symptoms present - Plan: alfuzosin  (UROXATRAL ) 10 MG 24 hr tablet, US  RENAL  Kidney stones - Plan: US  RENAL, DG Abd 1 View  Chronic constipation  History of balanitis - Plan: clotrimazole  (CLOTRIMAZOLE  AF) 1 % cream  Uncircumcised male - Plan: clotrimazole  (CLOTRIMAZOLE  AF) 1 % cream  1. Recurrent UTI.  - Likely due to BPH and constipation resulting in intermittent incomplete bladder emptying. - Asymptomatic today with no acute findings.  2. BPH. - Discussed how this may contribute to #1.  - Advised alpha-1 blockers. Will avoid Flomax due to Sulfa allergy. Uroxatrol (Alfuzosin ) prescribed. Discussed potential side effects such as lowering blood pressure, orthostatic hypotension, dizziness. Advised to maintain adequate daily fluid intake.  3. Kidney stone (recurrent).  - No recent relevant imaging per chart review. - Discussed stone as possible infectious nidus re: recurrent UTIs. - Advised RUS & KUB prior to next visit to check stone burden.   4. History of recurrent balanitis. - No acute findings on exam today. - Clotrimazole  1% cream prescribed for PRN use.   5. Chronic constipation.  - Discussed how this may contribute to #1.  - Advised constipation management with adequate daily fluid intake, fiber supplementation, stool softeners and/or laxatives PRN basis. Advised to follow up with PCP or Gastroenterology if constipation persists.   Will plan for follow up in 2 weeks. Pt verbalized understanding and agreement. All questions were answered.  PLAN Advised the following: 1. Start Uroxatral  10 mg daily for BPH. 2. Constipation management as discussed. 3. Clotrimazole  PRN for balanitis. 4. Maintain adequate daily fluid intake. 5. Return in about 2 weeks (around 12/30/2023) for RUS, KUB,  UA, PVR, & f/u with Griselda Lederer NP.  Orders Placed This Encounter  Procedures   US  RENAL    Standing Status:   Future    Expected Date:   12/16/2023    Expiration Date:   12/15/2024    Reason for Exam (SYMPTOM  OR DIAGNOSIS REQUIRED):   kidney stone known or suspected    Preferred imaging location?:   [facility]   DG Abd 1 View    Standing Status:   Future    Expected Date:   12/16/2023    Expiration Date:   12/15/2024    Reason for Exam (SYMPTOM  OR DIAGNOSIS REQUIRED):   kidney stone    Preferred imaging location?:   [facility]   Urinalysis, Routine w reflex microscopic   PR COMPLEX UROFLOMETRY   BLADDER SCAN AMB NON-IMAGING   Total time spent caring for the patient today was over 45 minutes. This includes time spent on the date of the visit reviewing the patient's chart before the visit, time spent during the visit, and time spent after the visit on documentation. Over 50% of that time was spent in face-to-face time with this patient for direct counseling. E&M based on time and complexity of medical decision making.  It has been explained that the patient is to follow regularly with their PCP in addition to all other providers involved in their care and to follow instructions provided by these respective offices. Patient advised to contact urology clinic if any urologic-pertaining questions, concerns, new symptoms or problems arise in the interim period.  There are no Patient Instructions on file for this visit.  Electronically signed by:  Lauretta Ponto, FNP   12/16/23    11:18 AM

## 2023-12-16 ENCOUNTER — Ambulatory Visit: Payer: Medicare HMO | Admitting: Urology

## 2023-12-16 ENCOUNTER — Encounter: Payer: Self-pay | Admitting: Urology

## 2023-12-16 VITALS — BP 142/73 | HR 83 | Temp 97.8°F

## 2023-12-16 DIAGNOSIS — N200 Calculus of kidney: Secondary | ICD-10-CM

## 2023-12-16 DIAGNOSIS — N400 Enlarged prostate without lower urinary tract symptoms: Secondary | ICD-10-CM

## 2023-12-16 DIAGNOSIS — K5909 Other constipation: Secondary | ICD-10-CM | POA: Insufficient documentation

## 2023-12-16 DIAGNOSIS — N390 Urinary tract infection, site not specified: Secondary | ICD-10-CM | POA: Diagnosis not present

## 2023-12-16 DIAGNOSIS — Z87438 Personal history of other diseases of male genital organs: Secondary | ICD-10-CM

## 2023-12-16 DIAGNOSIS — Z87440 Personal history of urinary (tract) infections: Secondary | ICD-10-CM

## 2023-12-16 DIAGNOSIS — Z789 Other specified health status: Secondary | ICD-10-CM | POA: Diagnosis not present

## 2023-12-16 LAB — MICROSCOPIC EXAMINATION
Bacteria, UA: NONE SEEN
RBC, Urine: NONE SEEN /[HPF] (ref 0–2)

## 2023-12-16 LAB — URINALYSIS, ROUTINE W REFLEX MICROSCOPIC
Bilirubin, UA: NEGATIVE
Glucose, UA: NEGATIVE
Ketones, UA: NEGATIVE
Nitrite, UA: NEGATIVE
Protein,UA: NEGATIVE
RBC, UA: NEGATIVE
Specific Gravity, UA: 1.025 (ref 1.005–1.030)
Urobilinogen, Ur: 0.2 mg/dL (ref 0.2–1.0)
pH, UA: 6 (ref 5.0–7.5)

## 2023-12-16 MED ORDER — CLOTRIMAZOLE 1 % EX CREA: TOPICAL_CREAM | CUTANEOUS | 0 refills | Status: AC

## 2023-12-16 MED ORDER — ALFUZOSIN HCL ER 10 MG PO TB24: 10.0000 mg | ORAL_TABLET | Freq: Every day | ORAL | 2 refills | Status: DC

## 2023-12-16 NOTE — Progress Notes (Addendum)
PVR- 58

## 2023-12-23 ENCOUNTER — Ambulatory Visit (HOSPITAL_COMMUNITY)
Admission: RE | Admit: 2023-12-23 | Discharge: 2023-12-23 | Disposition: A | Discharge status: Home / Self Care | Payer: Medicare HMO | Source: Ambulatory Visit | Attending: Urology | Admitting: Urology

## 2023-12-23 DIAGNOSIS — N390 Urinary tract infection, site not specified: Secondary | ICD-10-CM | POA: Insufficient documentation

## 2023-12-23 DIAGNOSIS — N400 Enlarged prostate without lower urinary tract symptoms: Secondary | ICD-10-CM

## 2023-12-23 DIAGNOSIS — N200 Calculus of kidney: Secondary | ICD-10-CM | POA: Diagnosis not present

## 2023-12-23 DIAGNOSIS — I878 Other specified disorders of veins: Secondary | ICD-10-CM | POA: Diagnosis not present

## 2023-12-23 DIAGNOSIS — N281 Cyst of kidney, acquired: Secondary | ICD-10-CM | POA: Diagnosis not present

## 2023-12-23 NOTE — Progress Notes (Signed)
Name: Joseph [name] DOB: 1941/10/26 MRN: [id number]  History of Present Illness: Joseph [name] is a 83 y.o. male who presents today for follow up visit at [facility].  - GU history: 1. BPH with LUTS (urinary urgency, frequency, nocturia). - Last known PSA was normal on 03/22/2013 (3.68). 2. Kidney stone (recurrent).  - Possible infectious nidus contributing to recurrent UTIs. 3. Recurrent UTI. - Risk factors include likely intermittent incomplete bladder emptying secondary to his constipation / BPH. 4. Recurrent balanitis.  5. Chronic constipation. - Takes laxatives PRN. - Prior episode of fecal impaction a few months ago requiring manual disimpaction in ER.   Urine culture results in past 12 months: - 05/26/2023: Negative - 08/18/2023: Positive for >100k Staphylococcus lugdunensis; treated with Doxycycline - 10/21/2023: Positive for >100k Staphylococcus lugdunensis; treated with Fosfomycin 3 g - 11/10/2023: Positive for >100k Staphylococcus lugdunensis; treated with Fosfomycin 3 g - 11/17/2023: Negative; treated with Nitrofurantoin   At initial visit on 12/16/2023: - Asymptomatic. - PVR = 58 ml. - Advised the following: 1. Start Uroxatral 10 mg daily for BPH (alternative to Flomax due to sulfa allergy).  2. Constipation management. 3. Clotrimazole PRN for balanitis. 4. Maintain adequate daily fluid intake. 5. Return in about 2 weeks (around 12/30/2023) for RUS, KUB, UA, PVR, & f/u with Evette Georges NP.  Since last visit: > 12/23/2023: - KUB: Exam limited by overlying bowel contents (constipation); punctate left kidney stones. - RUS:  1. Left nephrolithiasis (7 mm). 2. Bilateral renal cysts (2.3 cm on right; 6.6 cm on left). 3. No hydronephrosis bilaterally.  Today: He reports that his urinary symptoms have improved significantly since starting Uroxatral 10 mg daily. Reports decreased urinary urgency, frequency, nocturia, and hesitancy. Denies dysuria, gross  hematuria, straining to void, or sensations of incomplete emptying.  Denies any acute concerns about balanitis; using Clotrimazole PRN as prescribed.   Denies acute flank pain or abdominal pain.   Fall Screening: Do you usually have a device to assist in your mobility? Yes - cane   Medications: Current Outpatient Medications  Medication Sig Dispense Refill   allopurinol (ZYLOPRIM) 300 MG tablet Take 1 tablet by mouth once daily 90 tablet 0   benazepril (LOTENSIN) 20 MG tablet Take 1 tablet (20 mg total) by mouth daily. 90 tablet 1   clonazePAM (KLONOPIN) 0.5 MG tablet Take 1 tablet by mouth twice daily as needed 60 tablet 0   clotrimazole (CLOTRIMAZOLE AF) 1 % cream Apply to head of penis 2 times per day as needed for balanitis (redness / warmth / swelling of foreskin). 180 g 0   furosemide (LASIX) 40 MG tablet Take 20 mg by mouth daily as needed for edema.     meclizine (ANTIVERT) 25 MG tablet TAKE 1 TABLET BY MOUTH THREE TIMES DAILY AS NEEDED FOR DIZZINESS 30 tablet 1   metoprolol succinate (TOPROL-XL) 25 MG 24 hr tablet Take 1 tablet (25 mg total) by mouth daily. 90 tablet 1   Multiple Vitamins-Minerals (COMPLETE MULTIVITAMIN/MINERAL PO) Take 1 tablet by mouth daily.     NIFEdipine (PROCARDIA-XL/NIFEDICAL-XL) 30 MG 24 hr tablet Take 1 tablet by mouth once daily 90 tablet 0   rosuvastatin (CRESTOR) 5 MG tablet Take 1 tablet (5 mg total) by mouth every other day. 45 tablet 1   traMADol (ULTRAM) 50 MG tablet Take 1 tablet (50 mg total) by mouth 3 (three) times daily as needed. 90 tablet 2   traZODone (DESYREL) 50 MG tablet Take 2 tablets (100  mg total) by mouth at bedtime. 180 tablet 1   alfuzosin (UROXATRAL) 10 MG 24 hr tablet Take 1 tablet (10 mg total) by mouth daily with breakfast. 30 tablet 11   fish oil-omega-3 fatty acids 1000 MG capsule Take 2 g by mouth daily.     No current facility-administered medications for this visit.    Allergies: Allergies  Allergen Reactions    Carafate [Sucralfate] Other (See Comments)    TOOK SKIN FROM HIS MOUTH   Ciprofloxacin Hcl Other (See Comments)    CAN'T REMEMBER   Prevacid [Lansoprazole] Other (See Comments)    MADE HIM FEEL BAD   Quinolones     Patient was warned about not using Cipro and similar antibiotics. Recent studies have raised concern that fluoroquinolone antibiotics could be associated with an increased risk of aortic aneurysm Fluoroquinolones have non-antimicrobial properties that might jeopardise the integrity of the extracellular matrix of the vascular wall In a  propensity score matched cohort study in [place], there was a 66% increased rate of aortic aneurysm or dissection associated with oral fluoroquinolone use, compared wit   Sulfa Antibiotics Diarrhea    SEVERE    Past Medical History:  Diagnosis Date   Anxiety    Aortic atherosclerosis (HCC)    Ascending aortic aneurysm (HCC)    CAD (coronary artery disease)    Mild CAD seen on cardiac cath in 2016   Essential hypertension    GERD (gastroesophageal reflux disease)    Gout    History of kidney stones    HLD (hyperlipidemia)    Insomnia    Obesity (BMI 30-39.9)    Prolonged Q-T interval on ECG    Past Surgical History:  Procedure Laterality Date   CARDIAC CATHETERIZATION N/A 06/27/2015   Procedure: Left Heart Cath and Coronary Angiography;  Surgeon: Lennette Bihari, MD;  Location: [facility];  Service: Cardiovascular;  Laterality: N/A;   HERNIA REPAIR     KIDNEY STONE SURGERY     Family History  Problem Relation Age of Onset   Heart disease Father    Heart attack Father    Diabetes Sister    Heart disease Brother    Deep vein thrombosis Brother    Congestive Heart Failure Brother    Cancer Brother    Leukemia Brother    Heart attack Brother    Social History   Socioeconomic History   Marital status: Significant Other    Spouse name: Not on file   Number of children: 2   Years of education: 10   Highest education  level: 10th grade  Occupational History   Occupation: Retired    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, forensic  Tobacco Use   Smoking status: Former    Current packs/day: 0.00    Average packs/day: 0.5 packs/day for 1 year (0.5 ttl pk-yrs)    Types: Cigarettes    Start date: 03/22/1957    Quit date: 03/22/1958    Years since quitting: 65.8   Smokeless tobacco: Former    Types: Chew   Tobacco comments:    QUIT CHEWING IN 1989  Vaping Use   Vaping status: Never Used  Substance and Sexual Activity   Alcohol use: No    Alcohol/week: 0.0 standard drinks of alcohol   Drug use: No   Sexual activity: Not on file  Other Topics Concern   Not on file  Social History Narrative   Lives alone, but sometimes Roxy Manns, significant other stays with  him; He has 2 daughters who visit often   Right handed    Caffeine use: none   Social Drivers of Corporate investment banker Strain: Low Risk  (05/14/2023)   Overall Financial Resource Strain (CARDIA)    Difficulty of Paying Living Expenses: Not hard at all  Food Insecurity: No Food Insecurity (05/14/2023)   Hunger Vital Sign    Worried About Running Out of Food in the Last Year: Never true    Ran Out of Food in the Last Year: Never true  Transportation Needs: No Transportation Needs (05/14/2023)   PRAPARE - Administrator, Civil Service (Medical): No    Lack of Transportation (Non-Medical): No  Physical Activity: Insufficiently Active (05/14/2023)   Exercise Vital Sign    Days of Exercise per Week: 2 days    Minutes of Exercise per Session: 20 min  Stress: No Stress Concern Present (05/14/2023)   [employer] of Occupational Health - Occupational Stress Questionnaire    Feeling of Stress : Not at all  Social Connections: Moderately Isolated (05/14/2023)   Social Connection and Isolation Panel [NHANES]    Frequency of Communication with Friends and Family: More than three times a week    Frequency of Social Gatherings  with Friends and Family: More than three times a week    Attends Religious Services: More than 4 times per year    Active Member of [employer] or Organizations: No    Attends Banker Meetings: Never    Marital Status: Widowed  Intimate Partner Violence: Not At Risk (05/14/2023)   Humiliation, Afraid, Rape, and Kick questionnaire    Fear of Current or Ex-Partner: No    Emotionally Abused: No    Physically Abused: No    Sexually Abused: No    Review of Systems Constitutional: Patient denies any unintentional weight loss or change in strength lntegumentary: Patient denies any rashes or pruritus Cardiovascular: Patient denies chest pain or syncope Respiratory: Patient denies shortness of breath Gastrointestinal: Patient denies nausea, vomiting, or diarrhea Musculoskeletal: Patient denies muscle cramps or weakness Neurologic: Patient denies convulsions or seizures Allergic/Immunologic: Patient denies recent allergic reaction(s) Hematologic/Lymphatic: Patient denies bleeding tendencies Endocrine: Patient denies heat/cold intolerance  GU: As per HPI.  OBJECTIVE Vitals:   01/01/24 1305  BP: 122/77  Pulse: (!) 105   There is no height or weight on file to calculate BMI.  Physical Examination Constitutional: No obvious distress; patient is non-toxic appearing  Cardiovascular: No visible lower extremity edema.  Respiratory: The patient does not have audible wheezing/stridor; respirations do not appear labored  Gastrointestinal: Abdomen non-distended Musculoskeletal: Normal ROM of UEs  Skin: No obvious rashes/open sores  Neurologic: CN 2-12 grossly intact Psychiatric: Answered questions appropriately with normal affect  Hematologic/Lymphatic/Immunologic: No obvious bruises or sites of spontaneous bleeding  UA: negative  PVR: 36 ml  ASSESSMENT Benign prostatic hyperplasia with urinary frequency - Plan: Urinalysis, Routine w reflex microscopic, BLADDER SCAN AMB NON-IMAGING,  alfuzosin (UROXATRAL) 10 MG 24 hr tablet  Kidney stones - Plan: Urinalysis, Routine w reflex microscopic, BLADDER SCAN AMB NON-IMAGING, DG Abd 1 View  Chronic constipation - Plan: Urinalysis, Routine w reflex microscopic, BLADDER SCAN AMB NON-IMAGING  History of balanitis - Plan: Urinalysis, Routine w reflex microscopic, BLADDER SCAN AMB NON-IMAGING  Recurrent UTI - Plan: Urinalysis, Routine w reflex microscopic, BLADDER SCAN AMB NON-IMAGING  Bilateral renal cysts  We reviewed recent imaging results. He is doing well. Urinary symptoms manageable at this time per patient. We  agreed to continue current treatment regimen and to plan for follow up in 6 months with KUB for stone surveillance or sooner if needed. Patient verbalized understanding of and agreement with current plan. All questions were answered.  PLAN Advised the following: 1. Continue Uroxatral 10 mg daily. 2. Ongoing constipation management. 3. Clotrimazole PRN for balanitis. 4. Maintain adequate daily fluid intake. 5. Return in about 6 months (around 06/30/2024) for KUB, UA, PVR, & f/u with Evette Georges NP.  Orders Placed This Encounter  Procedures   DG Abd 1 View    Standing Status:   Future    Expected Date:   06/30/2024    Expiration Date:   12/31/2024    Reason for Exam (SYMPTOM  OR DIAGNOSIS REQUIRED):   kidney stone    Preferred imaging location?:   [facility]   Urinalysis, Routine w reflex microscopic   BLADDER SCAN AMB NON-IMAGING    It has been explained that the patient is to follow regularly with their PCP in addition to all other providers involved in their care and to follow instructions provided by these respective offices. Patient advised to contact urology clinic if any urologic-pertaining questions, concerns, new symptoms or problems arise in the interim period.  There are no Patient Instructions on file for this visit.  Electronically signed by:  Donnita Falls, FNP   01/01/24    1:32 PM

## 2023-12-29 ENCOUNTER — Other Ambulatory Visit: Payer: Self-pay | Admitting: Nurse Practitioner

## 2023-12-29 DIAGNOSIS — F411 Generalized anxiety disorder: Secondary | ICD-10-CM

## 2024-01-01 ENCOUNTER — Ambulatory Visit: Payer: Medicare HMO | Admitting: Urology

## 2024-01-01 ENCOUNTER — Encounter: Payer: Self-pay | Admitting: Urology

## 2024-01-01 VITALS — BP 122/77 | HR 105

## 2024-01-01 DIAGNOSIS — Z87440 Personal history of urinary (tract) infections: Secondary | ICD-10-CM | POA: Diagnosis not present

## 2024-01-01 DIAGNOSIS — R350 Frequency of micturition: Secondary | ICD-10-CM | POA: Diagnosis not present

## 2024-01-01 DIAGNOSIS — K5909 Other constipation: Secondary | ICD-10-CM | POA: Diagnosis not present

## 2024-01-01 DIAGNOSIS — N200 Calculus of kidney: Secondary | ICD-10-CM

## 2024-01-01 DIAGNOSIS — N390 Urinary tract infection, site not specified: Secondary | ICD-10-CM

## 2024-01-01 DIAGNOSIS — N281 Cyst of kidney, acquired: Secondary | ICD-10-CM | POA: Diagnosis not present

## 2024-01-01 DIAGNOSIS — N401 Enlarged prostate with lower urinary tract symptoms: Secondary | ICD-10-CM

## 2024-01-01 DIAGNOSIS — Z87438 Personal history of other diseases of male genital organs: Secondary | ICD-10-CM

## 2024-01-01 LAB — URINALYSIS, ROUTINE W REFLEX MICROSCOPIC
Bilirubin, UA: NEGATIVE
Glucose, UA: NEGATIVE
Ketones, UA: NEGATIVE
Leukocytes,UA: NEGATIVE
Nitrite, UA: NEGATIVE
Protein,UA: NEGATIVE
RBC, UA: NEGATIVE
Specific Gravity, UA: 1.025 (ref 1.005–1.030)
Urobilinogen, Ur: 1 mg/dL (ref 0.2–1.0)
pH, UA: 6 (ref 5.0–7.5)

## 2024-01-01 LAB — BLADDER SCAN AMB NON-IMAGING: Scan Result: 36

## 2024-01-01 MED ORDER — ALFUZOSIN HCL ER 10 MG PO TB24: 10.0000 mg | ORAL_TABLET | Freq: Every day | ORAL | 11 refills | Status: DC

## 2024-01-06 ENCOUNTER — Telehealth: Payer: Self-pay

## 2024-01-06 NOTE — Telephone Encounter (Signed)
 Pharmacy Patient Advocate Encounter   Received notification from CoverMyMeds that prior authorization for Clonazepam  0.5 mg tablets is required/requested.   Insurance verification completed.   The patient is insured through [employer] .   Per test claim: PA required; PA started via CoverMyMeds. KEY [id number] . Waiting for clinical questions to populate.

## 2024-01-08 ENCOUNTER — Other Ambulatory Visit: Payer: Self-pay | Admitting: Nurse Practitioner

## 2024-01-08 DIAGNOSIS — F411 Generalized anxiety disorder: Secondary | ICD-10-CM

## 2024-01-08 NOTE — Telephone Encounter (Signed)
 Copied from CRM [id number]. Topic: Clinical - Medication Refill >> Jan 08, 2024 10:31 AM Bascom RAMAN wrote: Most Recent Primary Care Visit:  Provider: GLADIS MUSTARD  Department: [facility]  Visit Type: OFFICE VISIT  Date: 11/17/2023  Medication: clonazePAM  (KLONOPIN ) 0.5 MG tablet   Has the patient contacted their pharmacy? Yes (Agent: If no, request that the patient contact the pharmacy for the refill. If patient does not wish to contact the pharmacy document the reason why and proceed with request.) (Agent: If yes, when and what did the pharmacy advise?)  Is this the correct pharmacy for this prescription? Yes If no, delete pharmacy and type the correct one.  This is the patient's preferred pharmacy:  [id number] - [place], [place] - [address] [place] [address] Phone: [phone] Fax: [phone]   Has the prescription been filled recently? No  Is the patient out of the medication? Yes  Has the patient been seen for an appointment in the last year OR does the patient have an upcoming appointment? Yes  Can we respond through MyChart? No  Agent: Please be advised that Rx refills may take up to 3 business days. We ask that you follow-up with your pharmacy.

## 2024-01-18 NOTE — Telephone Encounter (Signed)
 Clinical questions have been answered and PA submitted. PA currently Pending. Please be advised that most companies allow up to 30 days to make a decision. We will advise when a determination has been made, or follow up in 1 week.   Please reach out to our team, Rx Prior Auth Pool, if you haven't heard back in a week.

## 2024-01-19 ENCOUNTER — Other Ambulatory Visit (HOSPITAL_COMMUNITY): Payer: Self-pay

## 2024-01-19 NOTE — Telephone Encounter (Signed)
Pharmacy Patient Advocate Encounter  Received notification from [facility] that Prior Authorization for Clonazepam 0.5 mg tablets  has been APPROVED from 01/18/2024 to 11/30/2024. Ran test claim, Copay is $1.68. This test claim was processed through [facility]- copay amounts may vary at other pharmacies due to pharmacy/plan contracts, or as the patient moves through the different stages of their insurance plan.   PA #/Case ID/Reference #: [id number]

## 2024-02-08 ENCOUNTER — Other Ambulatory Visit: Payer: Self-pay | Admitting: Nurse Practitioner

## 2024-02-08 DIAGNOSIS — F411 Generalized anxiety disorder: Secondary | ICD-10-CM

## 2024-02-12 ENCOUNTER — Ambulatory Visit (INDEPENDENT_AMBULATORY_CARE_PROVIDER_SITE_OTHER): Payer: Medicare HMO | Admitting: Nurse Practitioner

## 2024-02-12 ENCOUNTER — Encounter: Payer: Self-pay | Admitting: Nurse Practitioner

## 2024-02-12 VITALS — BP 136/76 | HR 82 | Temp 97.4°F | Ht 68.0 in | Wt 256.0 lb

## 2024-02-12 DIAGNOSIS — I10 Essential (primary) hypertension: Secondary | ICD-10-CM

## 2024-02-12 DIAGNOSIS — F5101 Primary insomnia: Secondary | ICD-10-CM

## 2024-02-12 DIAGNOSIS — M1A09X0 Idiopathic chronic gout, multiple sites, without tophus (tophi): Secondary | ICD-10-CM | POA: Diagnosis not present

## 2024-02-12 DIAGNOSIS — E785 Hyperlipidemia, unspecified: Secondary | ICD-10-CM | POA: Diagnosis not present

## 2024-02-12 DIAGNOSIS — R600 Localized edema: Secondary | ICD-10-CM

## 2024-02-12 DIAGNOSIS — N401 Enlarged prostate with lower urinary tract symptoms: Secondary | ICD-10-CM

## 2024-02-12 DIAGNOSIS — F411 Generalized anxiety disorder: Secondary | ICD-10-CM

## 2024-02-12 DIAGNOSIS — F339 Major depressive disorder, recurrent, unspecified: Secondary | ICD-10-CM

## 2024-02-12 DIAGNOSIS — I7121 Aneurysm of the ascending aorta, without rupture: Secondary | ICD-10-CM

## 2024-02-12 DIAGNOSIS — G629 Polyneuropathy, unspecified: Secondary | ICD-10-CM | POA: Diagnosis not present

## 2024-02-12 DIAGNOSIS — R350 Frequency of micturition: Secondary | ICD-10-CM

## 2024-02-12 MED ORDER — CLONAZEPAM 0.5 MG PO TABS: 0.5000 mg | ORAL_TABLET | Freq: Two times a day (BID) | ORAL | 0 refills | Status: DC | PRN

## 2024-02-12 MED ORDER — TRAZODONE HCL 50 MG PO TABS: 100.0000 mg | ORAL_TABLET | Freq: Every day | ORAL | 1 refills | Status: DC

## 2024-02-12 MED ORDER — TRAMADOL HCL 50 MG PO TABS: 50.0000 mg | ORAL_TABLET | Freq: Three times a day (TID) | ORAL | 2 refills | Status: DC | PRN

## 2024-02-12 MED ORDER — ROSUVASTATIN CALCIUM 5 MG PO TABS: 5.0000 mg | ORAL_TABLET | ORAL | 1 refills | Status: DC

## 2024-02-12 MED ORDER — NIFEDIPINE ER OSMOTIC RELEASE 30 MG PO TB24: 30.0000 mg | ORAL_TABLET | Freq: Every day | ORAL | 0 refills | Status: DC

## 2024-02-12 MED ORDER — BENAZEPRIL HCL 20 MG PO TABS: 20.0000 mg | ORAL_TABLET | Freq: Every day | ORAL | 1 refills | Status: DC

## 2024-02-12 MED ORDER — METOPROLOL SUCCINATE ER 25 MG PO TB24: 25.0000 mg | ORAL_TABLET | Freq: Every day | ORAL | 1 refills | Status: DC

## 2024-02-12 MED ORDER — ALFUZOSIN HCL ER 10 MG PO TB24: 10.0000 mg | ORAL_TABLET | Freq: Every day | ORAL | 11 refills | Status: AC

## 2024-02-12 MED ORDER — FUROSEMIDE 40 MG PO TABS: 20.0000 mg | ORAL_TABLET | Freq: Every day | ORAL | 1 refills | Status: DC | PRN

## 2024-02-12 NOTE — Progress Notes (Signed)
 Subjective:    Patient ID: Joseph [name], male    DOB: 1940/12/17, 83 y.o.   MRN: [id number]   Chief Complaint: medical management of chronic issues     HPI:  Joseph [name] is a 83 y.o. who identifies as a male who was assigned male at birth.   Social history: Lives with: by himself- has a girlfriend that he stays with often Work history: retired   Water engineer in today for follow up of the following chronic medical issues:  1. Primary hypertension Np c/o chest pain, sob or headache. Does not check blood pressure at home. BP Readings from Last 3 Encounters:  01/01/24 122/77  12/16/23 (!) 142/73  11/17/23 (!) 154/72     2. Aneurysm of ascending aorta without rupture (HCC) Last chest CT was done on 08/10/23. Aneurysm had gronw from 4.8cm to 5.0 cm. recommend repeat ct in 6 months  3. Asymptomatic varicose veins of both lower extremities Legs just feel heavy at times but otherwise do not bother him.  4. Raynaud's disease without gangrene DOes not bother him in the summer  5. Hyperlipidemia with target LDL less than 100 Does not watch diet very closely and does no exercise at all. Lab Results  Component Value Date   CHOL 194 02/16/2023   HDL 38 (L) 02/16/2023   LDLCALC 124 (H) 02/16/2023   TRIG 178 (H) 02/16/2023   CHOLHDL 5.1 (H) 02/16/2023      6. Hypokalemia No recent muscle cramps Lab Results  Component Value Date   K 4.5 02/16/2023     7. Polyneuropathy Feet hurt and burn all the time. Takes ultram daily  Pain assessment: Cause of pain- DDD Pain location- lower back Pain on scale of 1-10- 8/10 Frequency- daily What increases pain-to much activity What makes pain Better-pain meds help Effects on ADL - none Any change in general medical condition-none  Current opioids rx- ultram # meds rx- 60 Effectiveness of current meds-helps Adverse reactions from pain meds-none Morphine equivalent- 10 MME  Pill count performed-No Last drug screen - 02/16/23 ( high  risk q95m, moderate risk q21m, low risk yearly ) Urine drug screen today- No Was the [facility] reviewed- yes  If yes were their any concerning findings? - no   Overdose risk: 1    07/05/2019   12:01 PM  Opioid Risk   Alcohol 0  Illegal Drugs 0  Rx Drugs 0  Alcohol 0  Illegal Drugs 0  Rx Drugs 0  Age between 16-45 years  0  History of Preadolescent Sexual Abuse 0  Psychological Disease 0  Depression 0  Opioid Risk Tool Scoring 0  Opioid Risk Interpretation Low Risk     Pain contract signed on: 02/18/23   8. Benign prostatic hyperplasia without lower urinary tract symptoms No voiding issues Lab Results  Component Value Date   PSA1 2.4 01/18/2021   PSA1 3.3 06/29/2018   PSA1 3.0 02/20/2017   PSA 3.3 01/08/2015   PSA 3.4 10/05/2014   PSA 2.9 06/13/2014      9. Depression, recurrent (HCC) Has had severe depression since his wife died >then 5 years ago. He is on lexapro. Some days are good and some days are a struggle.    02/12/2024   11:43 AM 10/21/2023   12:11 PM 08/18/2023    9:56 AM  Depression screen PHQ 2/9  Decreased Interest 0 0 0  Down, Depressed, Hopeless 0 0 0  PHQ - 2 Score 0 0 0  Altered sleeping  0 0  Tired, decreased energy  0 0  Change in appetite  0 0  Feeling bad or failure about yourself   0 0  Trouble concentrating  0 0  Moving slowly or fidgety/restless  0 0  Suicidal thoughts  0 0  PHQ-9 Score  0 0  Difficult doing work/chores   Not difficult at all      10. GAD (generalized anxiety disorder) Is on klonopin 2x a day. Gets very nervous without meds    02/12/2024   11:43 AM 10/21/2023   12:12 PM 08/18/2023    9:56 AM 05/26/2023    1:41 PM  GAD 7 : Generalized Anxiety Score  Nervous, Anxious, on Edge 0 1 1 0  Control/stop worrying 0 0 0 0  Worry too much - different things 0 0 1 0  Trouble relaxing 0 0 0 0  Restless 0 0 0 0  Easily annoyed or irritable 0 0 0 0  Afraid - awful might happen 0 0 0 0  Total GAD 7 Score 0 1 2 0  Anxiety  Difficulty Not difficult at all Not difficult at all Not difficult at all Not difficult at all    11. Peripheral edema Has daily edema of lower ext.  12. Primary insomnia Is on trazadone to sleep. Sleep good most nights  13. Idiopathic chronic gout of multiple sites without tophus No recent gout flareup.  14. Severe obesity (BMI >= 40) (HCC) Weight is up 21lbs  Wt Readings from Last 3 Encounters:  02/12/24 256 lb (116.1 kg)  11/17/23 235 lb (106.6 kg)  10/21/23 235 lb (106.6 kg)   BMI Readings from Last 3 Encounters:  02/12/24 38.92 kg/m  11/17/23 35.73 kg/m  10/21/23 35.73 kg/m      New complaints: None today  Allergies  Allergen Reactions   Carafate [Sucralfate] Other (See Comments)    TOOK SKIN FROM HIS MOUTH   Ciprofloxacin Hcl Other (See Comments)    CAN'T REMEMBER   Prevacid [Lansoprazole] Other (See Comments)    MADE HIM FEEL BAD   Quinolones     Patient was warned about not using Cipro and similar antibiotics. Recent studies have raised concern that fluoroquinolone antibiotics could be associated with an increased risk of aortic aneurysm Fluoroquinolones have non-antimicrobial properties that might jeopardise the integrity of the extracellular matrix of the vascular wall In a  propensity score matched cohort study in [place], there was a 66% increased rate of aortic aneurysm or dissection associated with oral fluoroquinolone use, compared wit   Sulfa Antibiotics Diarrhea    SEVERE   Outpatient Encounter Medications as of 02/12/2024  Medication Sig   alfuzosin (UROXATRAL) 10 MG 24 hr tablet Take 1 tablet (10 mg total) by mouth daily with breakfast.   allopurinol (ZYLOPRIM) 300 MG tablet Take 1 tablet by mouth once daily   benazepril (LOTENSIN) 20 MG tablet Take 1 tablet (20 mg total) by mouth daily.   clonazePAM (KLONOPIN) 0.5 MG tablet Take 1 tablet by mouth twice daily as needed   clotrimazole (CLOTRIMAZOLE AF) 1 % cream Apply to head of penis 2 times  per day as needed for balanitis (redness / warmth / swelling of foreskin).   fish oil-omega-3 fatty acids 1000 MG capsule Take 2 g by mouth daily.   furosemide (LASIX) 40 MG tablet Take 20 mg by mouth daily as needed for edema.   meclizine (ANTIVERT) 25 MG tablet TAKE 1 TABLET BY MOUTH THREE TIMES DAILY  AS NEEDED FOR DIZZINESS   metoprolol succinate (TOPROL-XL) 25 MG 24 hr tablet Take 1 tablet (25 mg total) by mouth daily.   Multiple Vitamins-Minerals (COMPLETE MULTIVITAMIN/MINERAL PO) Take 1 tablet by mouth daily.   NIFEdipine (PROCARDIA-XL/NIFEDICAL-XL) 30 MG 24 hr tablet Take 1 tablet by mouth once daily   rosuvastatin (CRESTOR) 5 MG tablet Take 1 tablet (5 mg total) by mouth every other day.   traMADol (ULTRAM) 50 MG tablet Take 1 tablet (50 mg total) by mouth 3 (three) times daily as needed.   traZODone (DESYREL) 50 MG tablet Take 2 tablets (100 mg total) by mouth at bedtime.   No facility-administered encounter medications on file as of 02/12/2024.    Past Surgical History:  Procedure Laterality Date   CARDIAC CATHETERIZATION N/A 06/27/2015   Procedure: Left Heart Cath and Coronary Angiography;  Surgeon: Lennette Bihari, MD;  Location: [facility] IN[facility];  Service: Cardiovascular;  Laterality: N/A;   HERNIA REPAIR     KIDNEY STONE SURGERY      Family History  Problem Relation Age of Onset   Heart disease Father    Heart attack Father    Diabetes Sister    Heart disease Brother    Deep vein thrombosis Brother    Congestive Heart Failure Brother    Cancer Brother    Leukemia Brother    Heart attack Brother       Controlled substance contract: n/a     Review of Systems  Constitutional:  Negative for diaphoresis.  Eyes:  Negative for pain.  Respiratory:  Negative for shortness of breath.   Cardiovascular:  Negative for chest pain, palpitations and leg swelling.  Gastrointestinal:  Negative for abdominal pain.  Endocrine: Negative for polydipsia.  Skin:  Negative for  rash.  Neurological:  Negative for dizziness, weakness and headaches.  Hematological:  Does not bruise/bleed easily.  All other systems reviewed and are negative.      Objective:   Physical Exam Vitals and nursing note reviewed.  Constitutional:      Appearance: Normal appearance. He is well-developed.  HENT:     Head: Normocephalic.     Nose: Nose normal.     Mouth/Throat:     Mouth: Mucous membranes are moist.     Pharynx: Oropharynx is clear.  Eyes:     Pupils: Pupils are equal, round, and reactive to light.  Neck:     Thyroid: No thyroid mass or thyromegaly.     Vascular: No carotid bruit or JVD.     Trachea: Phonation normal.  Cardiovascular:     Rate and Rhythm: Normal rate and regular rhythm.  Pulmonary:     Effort: Pulmonary effort is normal. No respiratory distress.     Breath sounds: Normal breath sounds.  Abdominal:     General: Bowel sounds are normal.     Palpations: Abdomen is soft.     Tenderness: There is no abdominal tenderness.  Musculoskeletal:        General: Normal range of motion.     Cervical back: Normal range of motion and neck supple.     Comments: FROM of left hand without pain. Fist strong  Lymphadenopathy:     Cervical: No cervical adenopathy.  Skin:    General: Skin is warm and dry.  Neurological:     Mental Status: He is alert and oriented to person, place, and time.  Psychiatric:        Behavior: Behavior normal.  Thought Content: Thought content normal.        Judgment: Judgment normal.     BP 136/76   Pulse 82   Temp (!) 97.4 F (36.3 C) (Temporal)   Ht 5\' 8"  (1.727 m)   Wt 256 lb (116.1 kg)   SpO2 95%   BMI 38.92 kg/m          Assessment & Plan:   Joseph [name] comes in today with chief complaint of Medical Management of Chronic Issues   Diagnosis and orders addressed:  1. Primary hypertension (Primary) Low sodium diet - benazepril (LOTENSIN) 20 MG tablet; Take 1 tablet (20 mg total) by mouth daily.   Dispense: 90 tablet; Refill: 1 - metoprolol succinate (TOPROL-XL) 25 MG 24 hr tablet; Take 1 tablet (25 mg total) by mouth daily.  Dispense: 90 tablet; Refill: 1 - CBC with Differential/Platelet - CMP14+EGFR  2. 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  Dispense: 90 tablet; Refill: 0  3. Polyneuropathy Report any sores on feet - traMADol (ULTRAM) 50 MG tablet; Take 1 tablet (50 mg total) by mouth 3 (three) times daily as needed.  Dispense: 90 tablet; Refill: 2  4. Benign prostatic hyperplasia with urinary frequency Report any voiding issues - alfuzosin (UROXATRAL) 10 MG 24 hr tablet; Take 1 tablet (10 mg total) by mouth daily with breakfast.  Dispense: 30 tablet; Refill: 11  5. Depression, recurrent (HCC) Stress management  6. GAD (generalized anxiety disorder) Stress management - ToxASSURE Select 13 (MW), Urine - clonazePAM (KLONOPIN) 0.5 MG tablet; Take 1 tablet (0.5 mg total) by mouth 2 (two) times daily as needed.  Dispense: 60 tablet; Refill: 0  7. Peripheral edema Elevate legs when sitting - furosemide (LASIX) 40 MG tablet; Take 0.5 tablets (20 mg total) by mouth daily as needed for edema.  Dispense: 90 tablet; Refill: 1  8. Primary insomnia Bedtime routine - traZODone (DESYREL) 50 MG tablet; Take 2 tablets (100 mg total) by mouth at bedtime.  Dispense: 180 tablet; Refill: 1  9. Idiopathic chronic gout of multiple sites without tophus  10. Severe obesity (BMI >= 40) (HCC) Discussed diet and exercise for person with BMI >25 Will recheck weight in 3-6 months   11. Hyperlipidemia with target LDL less than 100 Low fat diet - rosuvastatin (CRESTOR) 5 MG tablet; Take 1 tablet (5 mg total) by mouth every other day.  Dispense: 45 tablet; Refill: 1 - Lipid panel   Labs pending Health Maintenance reviewed Diet and exercise encouraged  Follow up plan: 3  months   Mary-Margaret Daphine Deutscher, FNP

## 2024-02-12 NOTE — Patient Instructions (Signed)
 Insomnia Insomnia is a sleep disorder that makes it difficult to fall asleep or stay asleep. Insomnia can cause fatigue, low energy, difficulty concentrating, mood swings, and poor performance at work or school. There are three different ways to classify insomnia: Difficulty falling asleep. Difficulty staying asleep. Waking up too early in the morning. Any type of insomnia can be long-term (chronic) or short-term (acute). Both are common. Short-term insomnia usually lasts for 3 months or less. Chronic insomnia occurs at least three times a week for longer than 3 months. What are the causes? Insomnia may be caused by another condition, situation, or substance, such as: Having certain mental health conditions, such as anxiety and depression. Using caffeine, alcohol, tobacco, or drugs. Having gastrointestinal conditions, such as gastroesophageal reflux disease (GERD). Having certain medical conditions. These include: Asthma. Alzheimer's disease. Stroke. Chronic pain. An overactive thyroid gland (hyperthyroidism). Other sleep disorders, such as restless legs syndrome and sleep apnea. Menopause. Sometimes, the cause of insomnia may not be known. What increases the risk? Risk factors for insomnia include: Gender. Females are affected more often than males. Age. Insomnia is more common as people get older. Stress and certain medical and mental health conditions. Lack of exercise. Having an irregular work schedule. This may include working night shifts and traveling between different time zones. What are the signs or symptoms? If you have insomnia, the main symptom is having trouble falling asleep or having trouble staying asleep. This may lead to other symptoms, such as: Feeling tired or having low energy. Feeling nervous about going to sleep. Not feeling rested in the morning. Having trouble concentrating. Feeling irritable, anxious, or depressed. How is this diagnosed? This condition  may be diagnosed based on: Your symptoms and medical history. Your health care provider may ask about: Your sleep habits. Any medical conditions you have. Your mental health. A physical exam. How is this treated? Treatment for insomnia depends on the cause. Treatment may focus on treating an underlying condition that is causing the insomnia. Treatment may also include: Medicines to help you sleep. Counseling or therapy. Lifestyle adjustments to help you sleep better. Follow these instructions at home: Eating and drinking  Limit or avoid alcohol, caffeinated beverages, and products that contain nicotine and tobacco, especially close to bedtime. These can disrupt your sleep. Do not eat a large meal or eat spicy foods right before bedtime. This can lead to digestive discomfort that can make it hard for you to sleep. Sleep habits  Keep a sleep diary to help you and your health care provider figure out what could be causing your insomnia. Write down: When you sleep. When you wake up during the night. How well you sleep and how rested you feel the next day. Any side effects of medicines you are taking. What you eat and drink. Make your bedroom a dark, comfortable place where it is easy to fall asleep. Put up shades or blackout curtains to block light from outside. Use a white noise machine to block noise. Keep the temperature cool. Limit screen use before bedtime. This includes: Not watching TV. Not using your smartphone, tablet, or computer. Stick to a routine that includes going to bed and waking up at the same times every day and night. This can help you fall asleep faster. Consider making a quiet activity, such as reading, part of your nighttime routine. Try to avoid taking naps during the day so that you sleep better at night. Get out of bed if you are still awake after  15 minutes of trying to sleep. Keep the lights down, but try reading or doing a quiet activity. When you feel  sleepy, go back to bed. General instructions Take over-the-counter and prescription medicines only as told by your health care provider. Exercise regularly as told by your health care provider. However, avoid exercising in the hours right before bedtime. Use relaxation techniques to manage stress. Ask your health care provider to suggest some techniques that may work well for you. These may include: Breathing exercises. Routines to release muscle tension. Visualizing peaceful scenes. Make sure that you drive carefully. Do not drive if you feel very sleepy. Keep all follow-up visits. This is important. Contact a health care provider if: You are tired throughout the day. You have trouble in your daily routine due to sleepiness. You continue to have sleep problems, or your sleep problems get worse. Get help right away if: You have thoughts about hurting yourself or someone else. Get help right away if you feel like you may hurt yourself or others, or have thoughts about taking your own life. Go to your nearest emergency room or: Call 911. Call the National Suicide Prevention Lifeline at (906)021-1611 or 988. This is open 24 hours a day. Text the Crisis Text Line at 325-069-2793. Summary Insomnia is a sleep disorder that makes it difficult to fall asleep or stay asleep. Insomnia can be long-term (chronic) or short-term (acute). Treatment for insomnia depends on the cause. Treatment may focus on treating an underlying condition that is causing the insomnia. Keep a sleep diary to help you and your health care provider figure out what could be causing your insomnia. This information is not intended to replace advice given to you by your health care provider. Make sure you discuss any questions you have with your health care provider. Document Revised: 10/28/2021 Document Reviewed: 10/28/2021 Elsevier Patient Education  2024 [employer].

## 2024-02-13 LAB — CMP14+EGFR
ALT: 24 IU/L (ref 0–44)
AST: 27 IU/L (ref 0–40)
Albumin: 4.1 g/dL (ref 3.7–4.7)
Alkaline Phosphatase: 66 IU/L (ref 44–121)
BUN/Creatinine Ratio: 12 (ref 10–24)
BUN: 12 mg/dL (ref 8–27)
Bilirubin Total: 0.5 mg/dL (ref 0.0–1.2)
CO2: 23 mmol/L (ref 20–29)
Calcium: 9.4 mg/dL (ref 8.6–10.2)
Chloride: 102 mmol/L (ref 96–106)
Creatinine, Ser: 0.97 mg/dL (ref 0.76–1.27)
Globulin, Total: 2.4 g/dL (ref 1.5–4.5)
Glucose: 104 mg/dL — ABNORMAL HIGH (ref 70–99)
Potassium: 4.7 mmol/L (ref 3.5–5.2)
Sodium: 141 mmol/L (ref 134–144)
Total Protein: 6.5 g/dL (ref 6.0–8.5)
eGFR: 78 mL/min/{1.73_m2} (ref >59)

## 2024-02-13 LAB — CBC WITH DIFFERENTIAL/PLATELET
Basophils Absolute: 0 10*3/uL (ref 0.0–0.2)
Basos: 1 %
EOS (ABSOLUTE): 0.1 10*3/uL (ref 0.0–0.4)
Eos: 1 %
Hematocrit: 43.4 % (ref 37.5–51.0)
Hemoglobin: 14 g/dL (ref 13.0–17.7)
Immature Grans (Abs): 0 10*3/uL (ref 0.0–0.1)
Immature Granulocytes: 0 %
Lymphocytes Absolute: 1.1 10*3/uL (ref 0.7–3.1)
Lymphs: 14 %
MCH: 32.4 pg (ref 26.6–33.0)
MCHC: 32.3 g/dL (ref 31.5–35.7)
MCV: 101 fL — ABNORMAL HIGH (ref 79–97)
Monocytes Absolute: 0.7 10*3/uL (ref 0.1–0.9)
Monocytes: 9 %
Neutrophils Absolute: 5.9 10*3/uL (ref 1.4–7.0)
Neutrophils: 75 %
Platelets: 219 10*3/uL (ref 150–450)
RBC: 4.32 x10E6/uL (ref 4.14–5.80)
RDW: 12.5 % (ref 11.6–15.4)
WBC: 7.8 10*3/uL (ref 3.4–10.8)

## 2024-02-13 LAB — LIPID PANEL
Chol/HDL Ratio: 3.4 ratio (ref 0.0–5.0)
Cholesterol, Total: 141 mg/dL (ref 100–199)
HDL: 41 mg/dL (ref >39)
LDL Chol Calc (NIH): 79 mg/dL (ref 0–99)
Triglycerides: 115 mg/dL (ref 0–149)
VLDL Cholesterol Cal: 21 mg/dL (ref 5–40)

## 2024-02-16 ENCOUNTER — Other Ambulatory Visit: Payer: Self-pay | Admitting: Nurse Practitioner

## 2024-02-16 LAB — TOXASSURE SELECT 13 (MW), URINE

## 2024-02-26 ENCOUNTER — Other Ambulatory Visit: Payer: Self-pay | Admitting: Nurse Practitioner

## 2024-02-26 DIAGNOSIS — M1A09X0 Idiopathic chronic gout, multiple sites, without tophus (tophi): Secondary | ICD-10-CM

## 2024-03-11 ENCOUNTER — Other Ambulatory Visit: Payer: Self-pay | Admitting: Nurse Practitioner

## 2024-03-11 DIAGNOSIS — F411 Generalized anxiety disorder: Secondary | ICD-10-CM

## 2024-03-24 ENCOUNTER — Encounter: Payer: Self-pay | Admitting: Cardiology

## 2024-03-24 ENCOUNTER — Ambulatory Visit: Payer: Medicare HMO | Attending: Cardiology | Admitting: Cardiology

## 2024-03-24 VITALS — BP 134/78 | HR 69 | Ht 68.0 in | Wt 257.6 lb

## 2024-03-24 DIAGNOSIS — E782 Mixed hyperlipidemia: Secondary | ICD-10-CM

## 2024-03-24 DIAGNOSIS — I7121 Aneurysm of the ascending aorta, without rupture: Secondary | ICD-10-CM | POA: Diagnosis not present

## 2024-03-24 DIAGNOSIS — R9431 Abnormal electrocardiogram [ECG] [EKG]: Secondary | ICD-10-CM

## 2024-03-24 DIAGNOSIS — I2510 Atherosclerotic heart disease of native coronary artery without angina pectoris: Secondary | ICD-10-CM | POA: Diagnosis not present

## 2024-03-24 DIAGNOSIS — I10 Essential (primary) hypertension: Secondary | ICD-10-CM | POA: Diagnosis not present

## 2024-03-24 NOTE — Patient Instructions (Signed)

## 2024-03-24 NOTE — Progress Notes (Signed)
 Cardiology Office Note  Date: 03/24/2024   ID: Durant, Scibilia 22-Apr-1941, MRN [id number]  History of Present Illness: Joseph [name] is an 83 y.o. male last seen in December 2023 by Ms. Peck NP, our last visit was in 2022.  He is here for a follow-up visit.  Reports no chest pain or palpitations.  He walks with a cane, states that he has had some mechanical falls.  Continues to follow with PCP regularly.  We went over his medications.  He reports compliance with current regimen which includes Toprol -XL 25 mg daily, Crestor  5 mg daily and Procardia  30 mg daily.  I reviewed his ECG today which shows sinus rhythm with left anterior fascicular block, decreased R wave progression.  He has not had a follow-up visit with [facility] since 2023.  We discussed the results of his surveillance chest CTA from September 2024.  At that time his ascending thoracic aorta had increased in size to 5 cm from 4.8 cm.  I talked with him about reevaluation by [facility] and at this point he declines, states that he is not interested in surgery although does realize the risk of aortic rupture.  Physical Exam: VS:  BP 134/78   Pulse 69   Ht 5\' 8"  (1.727 m)   Wt 257 lb 9.6 oz (116.8 kg)   SpO2 95%   BMI 39.17 kg/m , BMI Body mass index is 39.17 kg/m.  Wt Readings from Last 3 Encounters:  03/24/24 257 lb 9.6 oz (116.8 kg)  02/12/24 256 lb (116.1 kg)  11/17/23 235 lb (106.6 kg)    General: Patient appears comfortable at rest. HEENT: Conjunctiva and lids normal. Neck: Supple, no elevated JVP or carotid bruits. Lungs: Clear to auscultation, nonlabored breathing at rest. Cardiac: Regular rate and rhythm, no S3, 2/6 systolic murmur.  ECG:  An ECG dated 11/18/2022 was personally reviewed today and demonstrated:  Sinus rhythm with left anterior fascicular block, nonspecific ST changes, prolonged QTc.  Labwork: 02/12/2024: ALT 24; AST 27; BUN 12; Creatinine, Ser 0.97; Hemoglobin 14.0; Platelets 219; Potassium 4.7;  Sodium 141     Component Value Date/Time   CHOL 141 02/12/2024 1212   CHOL 208 (H) 03/22/2013 0928   TRIG 115 02/12/2024 1212   TRIG 142 04/18/2015 0942   TRIG 163 (H) 03/22/2013 0928   HDL 41 02/12/2024 1212   HDL 43 04/18/2015 0942   HDL 39 (L) 03/22/2013 0928   CHOLHDL 3.4 02/12/2024 1212   LDLCALC 79 02/12/2024 1212   LDLCALC 65 07/10/2014 0936   LDLCALC 136 (H) 03/22/2013 0928   Other Studies Reviewed Today:  Chest CTA 08/10/2023: IMPRESSION: 1. Ascending thoracic aortic 5.0 cm aneurysm, increased from 4.8 cm on prior chest CT using similar measurement technique. Ascending thoracic aortic aneurysm. Recommend semi-annual imaging followup by CTA or MRA and referral to cardiothoracic surgery if not already obtained. This recommendation follows 2010 ACCF/AHA/AATS/ACR/ASA/SCA/SCAI/SIR/STS/SVM Guidelines for the Diagnosis and Management of Patients With Thoracic Aortic Disease. Circulation. 2010; 121: W098-J191. Aortic aneurysm NOS (ICD10-I71.9). 2. Two-vessel coronary atherosclerosis. 3. Nonobstructing bilateral nephrolithiasis. 4.  Aortic Atherosclerosis (ICD10-I70.0).  Assessment and Plan:  1.  Ascending thoracic aortic aneurysm, size increased to 5.0 cm up from 4.8 cm based on last chest CTA in September 2024.  Has not had interval follow-up with [facility], previously evaluated by Dr. Jeb Miner in 2023.  He is asymptomatic at this time, we discussed the results of his chest CTA and also implications as it relates to prognosis, potential  for surgery, and spontaneous aortic rupture.  He tells me that he does not want to have follow-up with [facility] at this point and is not interested in surgery.  Will continue medical therapy with focus on blood pressure control.  Plan to discuss again at office follow-up and if he continues to prefer conservative management, do not plan on follow-up surveillance imaging.   2.  Nonobstructive CAD by cardiac catheterization in 2016.  Angina reported.   Continue statin therapy.   3.  Primary hypertension.  No change in current regimen including Lotensin  20 mg daily, Toprol -XL 25 mg daily, and Procardia  XL 30 mg daily.  4.  Mixed hyperlipidemia.  LDL 79 in March.  Continue Crestor  5 mg daily.  Disposition:  Follow up  6 months.  Signed, Gerard Knight, M.D., F.A.C.C. [facility]

## 2024-03-31 ENCOUNTER — Other Ambulatory Visit: Payer: Self-pay

## 2024-03-31 DIAGNOSIS — I7121 Aneurysm of the ascending aorta, without rupture: Secondary | ICD-10-CM

## 2024-04-12 ENCOUNTER — Other Ambulatory Visit: Payer: Self-pay | Admitting: Nurse Practitioner

## 2024-04-12 DIAGNOSIS — F411 Generalized anxiety disorder: Secondary | ICD-10-CM

## 2024-04-29 ENCOUNTER — Ambulatory Visit
Admission: RE | Admit: 2024-04-29 | Discharge: 2024-04-29 | Disposition: A | Discharge status: Home / Self Care | Source: Ambulatory Visit | Attending: Nurse Practitioner | Admitting: Nurse Practitioner

## 2024-04-29 DIAGNOSIS — I7121 Aneurysm of the ascending aorta, without rupture: Secondary | ICD-10-CM | POA: Diagnosis not present

## 2024-05-05 ENCOUNTER — Other Ambulatory Visit: Payer: Self-pay | Admitting: Nurse Practitioner

## 2024-05-05 DIAGNOSIS — F411 Generalized anxiety disorder: Secondary | ICD-10-CM

## 2024-05-09 ENCOUNTER — Ambulatory Visit: Payer: Self-pay | Admitting: Nurse Practitioner

## 2024-05-10 ENCOUNTER — Telehealth: Payer: Self-pay | Admitting: *Deleted

## 2024-05-10 NOTE — Telephone Encounter (Signed)
 Patient contacted the office stating his PCP advised him to call for follow up appt with Dr. Matt Song after CT chest on 5/30. Advised Dr. Matt Song is now retired. Pt set to come in Sept for yearly follow up. Appt made for patient to be seen by Dr. Sherene Dilling in July. Patient acknowledge appt date and time.

## 2024-05-16 ENCOUNTER — Encounter: Payer: Self-pay | Admitting: Nurse Practitioner

## 2024-05-16 ENCOUNTER — Ambulatory Visit: Payer: Self-pay | Admitting: Nurse Practitioner

## 2024-05-16 ENCOUNTER — Ambulatory Visit (INDEPENDENT_AMBULATORY_CARE_PROVIDER_SITE_OTHER)

## 2024-05-16 ENCOUNTER — Ambulatory Visit (INDEPENDENT_AMBULATORY_CARE_PROVIDER_SITE_OTHER): Admitting: Nurse Practitioner

## 2024-05-16 VITALS — BP 132/75 | HR 82 | Temp 97.7°F | Ht 68.0 in | Wt 253.0 lb

## 2024-05-16 DIAGNOSIS — M79642 Pain in left hand: Secondary | ICD-10-CM

## 2024-05-16 DIAGNOSIS — G629 Polyneuropathy, unspecified: Secondary | ICD-10-CM

## 2024-05-16 DIAGNOSIS — S52612A Displaced fracture of left ulna styloid process, initial encounter for closed fracture: Secondary | ICD-10-CM | POA: Diagnosis not present

## 2024-05-16 DIAGNOSIS — F411 Generalized anxiety disorder: Secondary | ICD-10-CM | POA: Diagnosis not present

## 2024-05-16 DIAGNOSIS — R350 Frequency of micturition: Secondary | ICD-10-CM | POA: Diagnosis not present

## 2024-05-16 DIAGNOSIS — M1812 Unilateral primary osteoarthritis of first carpometacarpal joint, left hand: Secondary | ICD-10-CM | POA: Diagnosis not present

## 2024-05-16 LAB — URINALYSIS, ROUTINE W REFLEX MICROSCOPIC
Bilirubin, UA: NEGATIVE
Glucose, UA: NEGATIVE
Ketones, UA: NEGATIVE
Leukocytes,UA: NEGATIVE
Nitrite, UA: NEGATIVE
Protein,UA: NEGATIVE
RBC, UA: NEGATIVE
Specific Gravity, UA: 1.015 (ref 1.005–1.030)
Urobilinogen, Ur: 0.2 mg/dL (ref 0.2–1.0)
pH, UA: 7 (ref 5.0–7.5)

## 2024-05-16 MED ORDER — CLONAZEPAM 0.5 MG PO TABS: 0.5000 mg | ORAL_TABLET | Freq: Two times a day (BID) | ORAL | 5 refills | Status: DC | PRN

## 2024-05-16 MED ORDER — TRAMADOL HCL 50 MG PO TABS: 50.0000 mg | ORAL_TABLET | Freq: Three times a day (TID) | ORAL | 2 refills | Status: DC | PRN

## 2024-05-16 NOTE — Progress Notes (Signed)
 Subjective:    Patient ID: Joseph [name], male    DOB: [birth year], 83 y.o.   MRN: [id number]   Chief Complaint: polyneuropathy- pain management  HPI Pain assessment: Cause of pain- polyneuropathy and back pain Pain location- multiple places as well as lower back Pain on scale of 1-10- 7-8/10 Frequency- daily What increases pain-to much standing and walking What makes pain Better-rest helps sometimes Effects on ADL - none Any change in general medical condition-none  Current opioids rx- ultram  TID # meds rx- 90 Effectiveness of current meds-helps Adverse reactions from pain meds-none Morphine  equivalent- 15 MME  Pill count performed-No Last drug screen - 02/16/24 ( high risk q47m, moderate risk q63m, low risk yearly ) Urine drug screen today- No Was the [facility] reviewed- yes  If yes were their any concerning findings? - no   Overdose risk: 1    07/05/2019   12:01 PM  Opioid Risk   Alcohol 0   Illegal Drugs 0  Rx Drugs 0  Alcohol 0  Illegal Drugs 0  Rx Drugs 0  Age between 16-45 years  0  History of Preadolescent Sexual Abuse 0  Psychological Disease 0  Depression 0  Opioid Risk Tool Scoring 0  Opioid Risk Interpretation Low Risk     Data saved with a previous flowsheet row definition    Pain contract signed on:02/18/24  Patient Active Problem List   Diagnosis Date Noted   Bilateral renal cysts 01/01/2024   Recurrent UTI 12/23/2023   History of balanitis 12/16/2023   Chronic constipation 12/16/2023   Kidney stones 12/16/2023   Depression, recurrent (HCC) 02/16/2023   Peripheral edema 02/14/2022   GAD (generalized anxiety disorder) 04/17/2021   Polyneuropathy 06/07/2019   Thoracic ascending aortic aneurysm (HCC) 07/27/2015   Varicose veins of both lower extremities 07/27/2015   Severe obesity (BMI >= 40) (HCC) 04/18/2015   Raynauds disease 04/18/2015   BPH (benign prostatic hyperplasia) 07/10/2014   Gout 04/04/2014   Hypertension 03/22/2013    Hyperlipidemia with target LDL less than 100 03/22/2013   Insomnia 03/22/2013    Patient wants his urine checked because he is always concerned that may have UTI. Hs some frequency bt no dysuria.  Says hand has been hurting for several weeks. He fell again the day and hurt same hand.  Anxiety    05/16/2024    9:48 AM 02/12/2024   11:43 AM 10/21/2023   12:12 PM 08/18/2023    9:56 AM  GAD 7 : Generalized Anxiety Score  Nervous, Anxious, on Edge 1 0 1 1  Control/stop worrying 0 0 0 0  Worry too much - different things 0 0 0 1  Trouble relaxing 0 0 0 0  Restless 0 0 0 0  Easily annoyed or irritable 0 0 0 0  Afraid - awful might happen 0 0 0 0  Total GAD 7 Score 1 0 1 2  Anxiety Difficulty Not difficult at all Not difficult at all Not difficult at all Not difficult at all      05/16/2024    9:48 AM 02/12/2024   11:43 AM 10/21/2023   12:11 PM  Depression screen PHQ 2/9  Decreased Interest 0 0 0  Down, Depressed, Hopeless 0 0 0  PHQ - 2 Score 0 0 0  Altered sleeping   0  Tired, decreased energy   0  Change in appetite   0  Feeling bad or failure about yourself    0  Trouble concentrating  0  Moving slowly or fidgety/restless   0  Suicidal thoughts   0  PHQ-9 Score   0      Review of Systems  Constitutional:  Negative for diaphoresis.  Eyes:  Negative for pain.  Respiratory:  Negative for shortness of breath.   Cardiovascular:  Negative for chest pain, palpitations and leg swelling.  Gastrointestinal:  Negative for abdominal pain.  Endocrine: Negative for polydipsia.  Genitourinary:  Positive for dysuria, frequency and urgency.  Skin:  Negative for rash.  Neurological:  Negative for dizziness, weakness and headaches.  Hematological:  Does not bruise/bleed easily.  All other systems reviewed and are negative.      Objective:   Physical Exam Constitutional:      Appearance: Normal appearance. He is obese.   Cardiovascular:     Rate and Rhythm: Normal rate and  regular rhythm.     Heart sounds: Normal heart sounds.  Pulmonary:     Effort: Pulmonary effort is normal.     Breath sounds: Normal breath sounds.  Abdominal:     Tenderness: There is no abdominal tenderness. There is no right CVA tenderness or left CVA tenderness.  Genitourinary:    Penis: Normal.      Testes: Normal.   Musculoskeletal:     Comments: Left hand pain when making a fist. No edema No pain with supination and pronation   Skin:    General: Skin is warm.   Neurological:     General: No focal deficit present.     Mental Status: He is alert and oriented to person, place, and time.   Psychiatric:        Mood and Affect: Mood normal.        Behavior: Behavior normal.     BP 132/75   Pulse 82   Temp 97.7 F (36.5 C) (Temporal)   Ht 5' 8 (1.727 m)   Wt 253 lb (114.8 kg)   SpO2 97%   BMI 38.47 kg/m    Left hand xray- no fracture- Preliminary reading by Irvine Mantis, FNP  [facility]  Urine is clear    Assessment & Plan:  Joseph [name] in today with chief complaint of Medical Management of Chronic Issues (Left side pain. Wants urine checked)   1. Polyneuropathy Continue ultram  as prescribed - traMADol  (ULTRAM ) 50 MG tablet; Take 1 tablet (50 mg total) by mouth 3 (three) times daily as needed.  Dispense: 90 tablet; Refill: 2  2. Urinary frequency (Primary) Force fluids - Urinalysis, Routine w reflex microscopic - Urine Culture  3. Left hand pain Tylenol  OTC for pain 'ice if helps - DG Hand Complete Left  4. GAD (generalized anxiety disorder) Stress management - clonazePAM  (KLONOPIN ) 0.5 MG tablet; Take 1 tablet (0.5 mg total) by mouth 2 (two) times daily as needed.  Dispense: 60 tablet; Refill: 5    The above assessment and management plan was discussed with the patient. The patient verbalized understanding of and has agreed to the management plan. Patient is aware to call the clinic if symptoms persist or worsen. Patient is aware when to return to the  clinic for a follow-up visit. Patient educated on when it is appropriate to go to the emergency department.   Mary-Margaret Gaylyn Keas, FNP

## 2024-05-16 NOTE — Patient Instructions (Signed)
 Fall Prevention in the Home, Adult Falls can cause injuries and can happen to people of all ages. There are many things you can do to make your home safer and to help prevent falls. What actions can I take to prevent falls? General information Use good lighting in all rooms. Make sure to: Replace any light bulbs that burn out. Turn on the lights in dark areas and use night-lights. Keep items that you use often in easy-to-reach places. Lower the shelves around your home if needed. Move furniture so that there are clear paths around it. Do not use throw rugs or other things on the floor that can make you trip. If any of your floors are uneven, fix them. Add color or contrast paint or tape to clearly mark and help you see: Grab bars or handrails. First and last steps of staircases. Where the edge of each step is. If you use a ladder or stepladder: Make sure that it is fully opened. Do not climb a closed ladder. Make sure the sides of the ladder are locked in place. Have someone hold the ladder while you use it. Know where your pets are as you move through your home. What can I do in the bathroom?     Keep the floor dry. Clean up any water on the floor right away. Remove soap buildup in the bathtub or shower. Buildup makes bathtubs and showers slippery. Use non-skid mats or decals on the floor of the bathtub or shower. Attach bath mats securely with double-sided, non-slip rug tape. If you need to sit down in the shower, use a non-slip stool. Install grab bars by the toilet and in the bathtub and shower. Do not use towel bars as grab bars. What can I do in the bedroom? Make sure that you have a light by your bed that is easy to reach. Do not use any sheets or blankets on your bed that hang to the floor. Have a firm chair or bench with side arms that you can use for support when you get dressed. What can I do in the kitchen? Clean up any spills right away. If you need to reach something  above you, use a step stool with a grab bar. Keep electrical cords out of the way. Do not use floor polish or wax that makes floors slippery. What can I do with my stairs? Do not leave anything on the stairs. Make sure that you have a light switch at the top and the bottom of the stairs. Make sure that there are handrails on both sides of the stairs. Fix handrails that are broken or loose. Install non-slip stair treads on all your stairs if they do not have carpet. Avoid having throw rugs at the top or bottom of the stairs. Choose a carpet that does not hide the edge of the steps on the stairs. Make sure that the carpet is firmly attached to the stairs. Fix carpet that is loose or worn. What can I do on the outside of my home? Use bright outdoor lighting. Fix the edges of walkways and driveways and fix any cracks. Clear paths of anything that can make you trip, such as tools or rocks. Add color or contrast paint or tape to clearly mark and help you see anything that might make you trip as you walk through a door, such as a raised step or threshold. Trim any bushes or trees on paths to your home. Check to see if handrails are loose  or broken and that both sides of all steps have handrails. Install guardrails along the edges of any raised decks and porches. Have leaves, snow, or ice cleared regularly. Use sand, salt, or ice melter on paths if you live where there is ice and snow during the winter. Clean up any spills in your garage right away. This includes grease or oil spills. What other actions can I take? Review your medicines with your doctor. Some medicines can cause dizziness or changes in blood pressure, which increase your risk of falling. Wear shoes that: Have a low heel. Do not wear high heels. Have rubber bottoms and are closed at the toe. Feel good on your feet and fit well. Use tools that help you move around if needed. These include: Canes. Walkers. Scooters. Crutches. Ask  your doctor what else you can do to help prevent falls. This may include seeing a physical therapist to learn to do exercises to move better and get stronger. Where to find more information Centers for Disease Control and Prevention, [employer]: TonerPromos.no [employer] on Aging: BaseRingTones.pl National Institute on Aging: BaseRingTones.pl Contact a doctor if: You are afraid of falling at home. You feel weak, drowsy, or dizzy at home. You fall at home. Get help right away if you: Lose consciousness or have trouble moving after a fall. Have a fall that causes a head injury. These symptoms may be an emergency. Get help right away. Call 911. Do not wait to see if the symptoms will go away. Do not drive yourself to the hospital. This information is not intended to replace advice given to you by your health care provider. Make sure you discuss any questions you have with your health care provider. Document Revised: 07/21/2022 Document Reviewed: 07/21/2022 Elsevier Patient Education  2024 [employer].

## 2024-05-18 ENCOUNTER — Ambulatory Visit

## 2024-05-18 VITALS — BP 132/75 | HR 82 | Ht 68.0 in | Wt 253.0 lb

## 2024-05-18 DIAGNOSIS — Z0000 Encounter for general adult medical examination without abnormal findings: Secondary | ICD-10-CM | POA: Diagnosis not present

## 2024-05-18 LAB — URINE CULTURE: Organism ID, Bacteria: NO GROWTH

## 2024-05-18 NOTE — Progress Notes (Signed)
 Subjective:   Joseph [name] is a 83 y.o. who presents for a Medicare Wellness preventive visit.  As a reminder, Annual Wellness Visits don't include a physical exam, and some assessments may be limited, especially if this visit is performed virtually. We may recommend an in-person follow-up visit with your provider if needed.  Visit Complete: Virtual I connected with  Joseph [name] on 05/18/24 by a audio enabled telemedicine application and verified that I am speaking with the correct person using two identifiers.  Patient Location: Home  Provider Location: Home Office  I discussed the limitations of evaluation and management by telemedicine. The patient expressed understanding and agreed to proceed.  Vital Signs: Because this visit was a virtual/telehealth visit, some criteria may be missing or patient reported. Any vitals not documented were not able to be obtained and vitals that have been documented are patient reported.  VideoDeclined- This patient declined Librarian, academic. Therefore the visit was completed with audio only.  Persons Participating in Visit: Patient.  AWV Questionnaire: No: Patient Medicare AWV questionnaire was not completed prior to this visit.  Cardiac Risk Factors include: advanced age (>1men, >10 women);dyslipidemia;hypertension;male gender;obesity (BMI >30kg/m2)     Objective:    Today's Vitals   05/18/24 1409  BP: 132/75  Pulse: 82  Weight: 253 lb (114.8 kg)  Height: 5' 8 (1.727 m)   Body mass index is 38.47 kg/m.     05/18/2024    2:20 PM 05/14/2023    1:28 PM 04/25/2022    9:51 AM 04/24/2021   10:06 AM 08/05/2019   10:33 AM 08/03/2018   12:20 PM 06/27/2015    8:32 AM  Advanced Directives  Does Patient Have a Medical Advance Directive? Yes No No Yes Yes Yes  No   Type of Advance Directive Living will   Healthcare Power of [place];Living will Healthcare Power of [place];Living will Healthcare Power of  [place];Living will   Does patient want to make changes to medical advance directive?     No - Patient declined No - Patient declined    Copy of Healthcare Power of Attorney in Chart?    No - copy requested No - copy requested No - copy requested    Would patient like information on creating a medical advance directive?  No - Patient declined No - Patient declined    No - patient declined information      Data saved with a previous flowsheet row definition    Current Medications (verified) Outpatient Encounter Medications as of 05/18/2024  Medication Sig   alfuzosin  (UROXATRAL ) 10 MG 24 hr tablet Take 1 tablet (10 mg total) by mouth daily with breakfast.   allopurinol  (ZYLOPRIM ) 300 MG tablet Take 1 tablet by mouth once daily   benazepril  (LOTENSIN ) 20 MG tablet Take 1 tablet (20 mg total) by mouth daily.   clonazePAM  (KLONOPIN ) 0.5 MG tablet Take 1 tablet (0.5 mg total) by mouth 2 (two) times daily as needed.   clotrimazole  (CLOTRIMAZOLE  AF) 1 % cream Apply to head of penis 2 times per day as needed for balanitis (redness / warmth / swelling of foreskin).   fish oil-omega-3 fatty acids 1000 MG capsule Take 2 g by mouth daily.   furosemide  (LASIX ) 40 MG tablet Take 0.5 tablets (20 mg total) by mouth daily as needed for edema.   meclizine  (ANTIVERT ) 25 MG tablet TAKE 1 TABLET BY MOUTH THREE TIMES DAILY AS NEEDED FOR DIZZINESS   metoprolol  succinate (TOPROL -XL) 25  MG 24 hr tablet Take 1 tablet (25 mg total) by mouth daily.   Multiple Vitamins-Minerals (COMPLETE MULTIVITAMIN/MINERAL PO) Take 1 tablet by mouth daily.   NIFEdipine  (PROCARDIA -XL/NIFEDICAL-XL) 30 MG 24 hr tablet Take 1 tablet (30 mg total) by mouth daily.   rosuvastatin  (CRESTOR ) 5 MG tablet Take 1 tablet (5 mg total) by mouth every other day.   traMADol  (ULTRAM ) 50 MG tablet Take 1 tablet (50 mg total) by mouth 3 (three) times daily as needed.   traZODone  (DESYREL ) 50 MG tablet Take 2 tablets (100 mg total) by mouth at bedtime.    No facility-administered encounter medications on file as of 05/18/2024.    Allergies (verified) Carafate [sucralfate], Ciprofloxacin hcl, Prevacid [lansoprazole], Quinolones, and Sulfa antibiotics   History: Past Medical History:  Diagnosis Date   Anxiety    Aortic atherosclerosis (HCC)    Ascending aortic aneurysm (HCC)    CAD (coronary artery disease)    Mild CAD seen on cardiac cath in 2016   Essential hypertension    GERD (gastroesophageal reflux disease)    Gout    History of kidney stones    HLD (hyperlipidemia)    Insomnia    Obesity (BMI 30-39.9)    Prolonged Q-T interval on ECG    Past Surgical History:  Procedure Laterality Date   CARDIAC CATHETERIZATION N/A 06/27/2015   Procedure: Left Heart Cath and Coronary Angiography;  Surgeon: Millicent Ally, MD;  Location: [facility];  Service: Cardiovascular;  Laterality: N/A;   HERNIA REPAIR     KIDNEY STONE SURGERY     Family History  Problem Relation Age of Onset   Heart disease Father    Heart attack Father    Diabetes Sister    Heart disease Brother    Deep vein thrombosis Brother    Congestive Heart Failure Brother    Cancer Brother    Leukemia Brother    Heart attack Brother    Social History   Socioeconomic History   Marital status: Significant Other    Spouse name: Not on file   Number of children: 2   Years of education: 10   Highest education level: 10th grade  Occupational History   Occupation: Retired    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, forensic  Tobacco Use   Smoking status: Former    Current packs/day: 0.00    Average packs/day: 0.5 packs/day for 1 year (0.5 ttl pk-yrs)    Types: Cigarettes    Start date: 03/22/1957    Quit date: 03/22/1958    Years since quitting: 66.2   Smokeless tobacco: Former    Types: Chew   Tobacco comments:    QUIT CHEWING IN 1989  Vaping Use   Vaping status: Never Used  Substance and Sexual Activity   Alcohol use: No    Alcohol/week:  0.0 standard drinks of alcohol   Drug use: No   Sexual activity: Not on file  Other Topics Concern   Not on file  Social History Narrative   Lives alone, but sometimes [place] [facility], significant other stays with him; He has 2 daughters who visit often   Right handed    Caffeine use: none   Social Drivers of Corporate investment banker Strain: Low Risk  (05/18/2024)   Overall Financial Resource Strain (CARDIA)    Difficulty of Paying Living Expenses: Not hard at all  Food Insecurity: No Food Insecurity (05/18/2024)   Hunger Vital Sign    Worried About Running  Out of Food in the Last Year: Never true    Ran Out of Food in the Last Year: Never true  Transportation Needs: No Transportation Needs (05/18/2024)   PRAPARE - Administrator, Civil Service (Medical): No    Lack of Transportation (Non-Medical): No  Physical Activity: Insufficiently Active (05/18/2024)   Exercise Vital Sign    Days of Exercise per Week: 3 days    Minutes of Exercise per Session: 20 min  Stress: No Stress Concern Present (05/18/2024)   [employer] of Occupational Health - Occupational Stress Questionnaire    Feeling of Stress: Not at all  Social Connections: Moderately Isolated (05/18/2024)   Social Connection and Isolation Panel    Frequency of Communication with Friends and Family: More than three times a week    Frequency of Social Gatherings with Friends and Family: More than three times a week    Attends Religious Services: More than 4 times per year    Active Member of [employer] or Organizations: No    Attends Banker Meetings: Never    Marital Status: Widowed    Tobacco Counseling Counseling given: Yes Tobacco comments: QUIT CHEWING IN 1989    Clinical Intake:  Pre-visit preparation completed: Yes  Pain : No/denies pain     BMI - recorded: 38.47 Nutritional Status: BMI > 30  Obese Nutritional Risks: None Diabetes: No  Lab Results  Component Value Date   HGBA1C  5.5 02/17/2019   HGBA1C 5.5 09/18/2017   HGBA1C 5.7 02/23/2017     How often do you need to have someone help you when you read instructions, pamphlets, or other written materials from your doctor or pharmacy?: 1 - Never  Interpreter Needed?: No  Information entered by :: Alia t/cma   Activities of Daily Living     05/18/2024    2:15 PM  In your present state of health, do you have any difficulty performing the following activities:  Hearing? 1  Vision? 0  Difficulty concentrating or making decisions? 0  Walking or climbing stairs? 0  Dressing or bathing? 0  Doing errands, shopping? 0  Preparing Food and eating ? N  Using the Toilet? N  In the past six months, have you accidently leaked urine? Y  Do you have problems with loss of bowel control? N  Managing your Medications? N  Managing your Finances? N  Housekeeping or managing your Housekeeping? N    Patient Care Team: Delfina Feller, FNP as PCP - General (Nurse Practitioner) Gerard Knight, MD as PCP - Cardiology (Cardiology) Hyland Mailman, MD as Referring Physician (Optometry)  I have updated your Care Teams any recent Medical Services you may have received from other providers in the past year.     Assessment:   This is a routine wellness examination for Berley.  Hearing/Vision screen Hearing Screening - Comments:: Pt has some loss hearing Vision Screening - Comments:: Pt wears reading glasses for reading/pt goes [employer] in [place], [place]   Goals Addressed             This Visit's Progress    DIET - INCREASE WATER INTAKE       Exercise 150 min/wk Moderate Activity   On track      Depression Screen     05/18/2024    2:23 PM 05/16/2024    9:48 AM 02/12/2024   11:43 AM 10/21/2023   12:11 PM 08/18/2023    9:56 AM 05/26/2023  1:40 PM 05/14/2023    1:28 PM  PHQ 2/9 Scores  PHQ - 2 Score 0 0 0 0 0 0 0  PHQ- 9 Score 0   0 0 0     Fall Risk     05/18/2024    2:11 PM 05/16/2024    9:48 AM  02/12/2024   11:43 AM 10/21/2023   12:11 PM 08/18/2023    9:56 AM  Fall Risk   Falls in the past year? 1 1 1 1 1   Number falls in past yr: 1 1 1  0 0  Injury with Fall? 0 0 0 0 1  Risk for fall due to : Impaired balance/gait;Impaired mobility History of fall(s) History of fall(s) History of fall(s);Impaired balance/gait;Impaired mobility History of fall(s)  Follow up Falls evaluation completed Education provided Education provided Falls evaluation completed Education provided    MEDICARE RISK AT HOME:  Medicare Risk at Home Any stairs in or around the home?: Yes If so, are there any without handrails?: Yes Home free of loose throw rugs in walkways, pet beds, electrical cords, etc?: Yes Adequate lighting in your home to reduce risk of falls?: Yes Life alert?: No Use of a cane, walker or w/c?: Yes (use cane) Grab bars in the bathroom?: Yes Shower chair or bench in shower?: No Elevated toilet seat or a handicapped toilet?: Yes  TIMED UP AND GO:  Was the test performed?  no  Cognitive Function: 6CIT completed    08/03/2018   12:34 PM  MMSE - Mini Mental State Exam  Orientation to time 5  Orientation to Place 5  Registration 3  Attention/ Calculation 4  Recall 3  Language- name 2 objects 2  Language- repeat 1  Language- follow 3 step command 3  Language- read & follow direction 1  Write a sentence 1  Copy design 1  Total score 29        05/18/2024    2:25 PM 05/14/2023    1:29 PM 04/25/2022    9:49 AM 08/05/2019   10:34 AM  6CIT Screen  What Year? 0 points 0 points 0 points 0 points  What month? 0 points 0 points 0 points 0 points  What time? 0 points 0 points 0 points 0 points  Count back from 20 0 points 0 points 0 points 0 points  Months in reverse 0 points 0 points 0 points 4 points  Repeat phrase 8 points 2 points 2 points 0 points  Total Score 8 points 2 points 2 points 4 points    Immunizations Immunization History  Administered Date(s) Administered   Fluad  Quad(high Dose 65+) 08/18/2022   Influenza, High Dose Seasonal PF 09/11/2017, 10/07/2019, 09/14/2023   Influenza,inj,Quad PF,6+ Mos 10/05/2014, 10/30/2015, 08/28/2016, 10/01/2018   Influenza-Unspecified 08/31/2020, 08/31/2021   Moderna Sars-Covid-2 Vaccination 12/20/2019, 01/17/2020, 10/31/2020   Pneumococcal Conjugate-13 04/18/2015   Pneumococcal Polysaccharide-23 01/02/2014, 02/02/2018   Pneumococcal-Unspecified 02/02/2018   Tdap 10/05/2014   Zoster Recombinant(Shingrix ) 02/14/2022, 04/25/2022   Zoster, Live 10/30/2015    Screening Tests Health Maintenance  Topic Date Due   Zoster Vaccines- Shingrix  (2 of 2) 06/20/2022   COVID-19 Vaccine (4 - 2024-25 season) 06/01/2024 (Originally 08/02/2023)   INFLUENZA VACCINE  07/01/2024   DTaP/Tdap/Td (2 - Td or Tdap) 10/05/2024   Medicare Annual Wellness (AWV)  05/18/2025   Pneumococcal Vaccine: 50+ Years  Completed   HPV VACCINES  Aged Out   Meningococcal B Vaccine  Aged Out    Health Maintenance  Health  Maintenance Due  Topic Date Due   Zoster Vaccines- Shingrix  (2 of 2) 06/20/2022   Health Maintenance Items Addressed: See Nurse Notes at the end of this note  Additional Screening:  Vision Screening: Recommended annual ophthalmology exams for early detection of glaucoma and other disorders of the eye. Would you like a referral to an eye doctor? No    Dental Screening: Recommended annual dental exams for proper oral hygiene  Community Resource Referral / Chronic Care Management: CRR required this visit?  No   CCM required this visit?  No   Plan:    I have personally reviewed and noted the following in the patient's chart:   Medical and social history Use of alcohol, tobacco or illicit drugs  Current medications and supplements including opioid prescriptions. Patient is not currently taking opioid prescriptions. Functional ability and status Nutritional status Physical activity Advanced directives List of other  physicians Hospitalizations, surgeries, and ER visits in previous 12 months Vitals Screenings to include cognitive, depression, and falls Referrals and appointments  In addition, I have reviewed and discussed with patient certain preventive protocols, quality metrics, and best practice recommendations. A written personalized care plan for preventive services as well as general preventive health recommendations were provided to patient.   Michaelle Adolphus, CMA   05/18/2024   After Visit Summary: (Declined) Due to this being a telephonic visit, with patients personalized plan was offered to patient but patient Declined AVS at this time   Notes: Pt is due for shingles vaccine, pls discuss w/pt.

## 2024-05-18 NOTE — Patient Instructions (Signed)
 Mr. Joseph [name] , Thank you for taking time out of your busy schedule to complete your Annual Wellness Visit with me. I enjoyed our conversation and look forward to speaking with you again next year. I, as well as your care team,  appreciate your ongoing commitment to your health goals. Please review the following plan we discussed and let me know if I can assist you in the future. Your Game plan/ To Do List    Follow up Visits: Next Medicare AWV with our clinical staff: 05/22/25 at 1:10p.m.   Next Office Visit with your provider: 08/06/24 at 9:45a.m.  Clinician Recommendations:  Aim for 30 minutes of exercise or brisk walking, 6-8 glasses of water, and 5 servings of fruits and vegetables each day.       This is a list of the screening recommended for you and due dates:  Health Maintenance  Topic Date Due   Zoster (Shingles) Vaccine (2 of 2) 06/20/2022   COVID-19 Vaccine (4 - 2024-25 season) 06/01/2024*   Flu Shot  07/01/2024   DTaP/Tdap/Td vaccine (2 - Td or Tdap) 10/05/2024   Medicare Annual Wellness Visit  05/18/2025   Pneumococcal Vaccine for age over 33  Completed   HPV Vaccine  Aged Out   Meningitis B Vaccine  Aged Out  *Topic was postponed. The date shown is not the original due date.    Advanced directives: (Copy Requested) Please bring a copy of your health care power of attorney and living will to the office to be added to your chart at your convenience. You can mail to [facility] [address] [place], [place] [address] or email to [email or web address] Advance Care Planning is important because it:  [x]  Makes sure you receive the medical care that is consistent with your values, goals, and preferences  [x]  It provides guidance to your family and loved ones and reduces their decisional burden about whether or not they are making the right decisions based on your wishes.  Follow the link provided in your after visit summary or read over the paperwork we have  mailed to you to help you started getting your Advance Directives in place. If you need assistance in completing these, please reach out to us  so that we can help you!  See attachments for Preventive Care and Fall Prevention Tips.

## 2024-05-24 ENCOUNTER — Other Ambulatory Visit: Payer: Self-pay | Admitting: Nurse Practitioner

## 2024-05-24 DIAGNOSIS — M1A09X0 Idiopathic chronic gout, multiple sites, without tophus (tophi): Secondary | ICD-10-CM

## 2024-06-10 ENCOUNTER — Other Ambulatory Visit: Payer: Self-pay | Admitting: Nurse Practitioner

## 2024-06-10 DIAGNOSIS — I7121 Aneurysm of the ascending aorta, without rupture: Secondary | ICD-10-CM

## 2024-06-22 ENCOUNTER — Encounter: Payer: Self-pay | Admitting: Surgery

## 2024-06-22 ENCOUNTER — Ambulatory Visit: Attending: Surgery | Admitting: Surgery

## 2024-06-22 VITALS — BP 137/76 | HR 82 | Resp 20 | Ht 68.0 in | Wt 253.0 lb

## 2024-06-22 DIAGNOSIS — I7121 Aneurysm of the ascending aorta, without rupture: Secondary | ICD-10-CM

## 2024-06-22 NOTE — Progress Notes (Signed)
 [address], [place] [address]             [phone]     HPI:  The patient is an 83 year old gentleman with hypertension, hyperlipidemia, nonobstructive coronary disease by prior catheterization in 2016, with a 4.7 cm fusiform ascending aortic aneurysm that was being followed by Dr. Obadiah prior to his retirement.  He last saw the patient on 07/29/2022 and recommended a follow-up CTA in 1 year.  He was lost to follow-up but then saw Dr. Debera in April 2025 and was referred back to us .  He had a CTA of the chest in September 2024 showing the ascending aorta measuring 5 cm. He feels fairly well overall but has been using a cane due to balance issues with peripheral neuropathy and some falls.  He has chronic back pain and takes Ultram  3 times daily for pain.  Current Outpatient Medications  Medication Sig Dispense Refill   alfuzosin  (UROXATRAL ) 10 MG 24 hr tablet Take 1 tablet (10 mg total) by mouth daily with breakfast. 30 tablet 11   allopurinol  (ZYLOPRIM ) 300 MG tablet Take 1 tablet by mouth once daily 90 tablet 0   benazepril  (LOTENSIN ) 20 MG tablet Take 1 tablet (20 mg total) by mouth daily. 90 tablet 1   clonazePAM  (KLONOPIN ) 0.5 MG tablet Take 1 tablet (0.5 mg total) by mouth 2 (two) times daily as needed. 60 tablet 5   clotrimazole  (CLOTRIMAZOLE  AF) 1 % cream Apply to head of penis 2 times per day as needed for balanitis (redness / warmth / swelling of foreskin). 180 g 0   fish oil-omega-3 fatty acids 1000 MG capsule Take 2 g by mouth daily.     furosemide  (LASIX ) 40 MG tablet Take 0.5 tablets (20 mg total) by mouth daily as needed for edema. 90 tablet 1   meclizine  (ANTIVERT ) 25 MG tablet TAKE 1 TABLET BY MOUTH THREE TIMES DAILY AS NEEDED FOR DIZZINESS 30 tablet 0   metoprolol  succinate (TOPROL -XL) 25 MG 24 hr tablet Take 1 tablet (25 mg total) by mouth daily. 90 tablet 1   Multiple Vitamins-Minerals (COMPLETE MULTIVITAMIN/MINERAL PO) Take 1 tablet by  mouth daily.     NIFEdipine  (PROCARDIA -XL/NIFEDICAL-XL) 30 MG 24 hr tablet Take 1 tablet by mouth once daily 90 tablet 0   rosuvastatin  (CRESTOR ) 5 MG tablet Take 1 tablet (5 mg total) by mouth every other day. 45 tablet 1   traMADol  (ULTRAM ) 50 MG tablet Take 1 tablet (50 mg total) by mouth 3 (three) times daily as needed. 90 tablet 2   traZODone  (DESYREL ) 50 MG tablet Take 2 tablets (100 mg total) by mouth at bedtime. 180 tablet 1   No current facility-administered medications for this visit.     Physical Exam: BP 137/76   Pulse 82   Resp 20   Ht 5' 8 (1.727 m)   Wt 253 lb (114.8 kg)   SpO2 94% Comment: RA  BMI 38.47 kg/m  He is an elderly gentleman in no distress. Cardiac exam shows a regular rate and rhythm with normal heart sounds.  There is no murmur. Lungs are clear.  Diagnostic Tests:  Narrative & Impression  CLINICAL DATA:  Follow-up ascending aortic aneurysm.   EXAM: CT CHEST WITHOUT CONTRAST   TECHNIQUE: Multidetector CT imaging of the chest was performed following the standard protocol without IV contrast.   RADIATION DOSE REDUCTION: This exam was performed according to the departmental dose-optimization program which includes  automated exposure control, adjustment of the mA and/or kV according to patient size and/or use of iterative reconstruction technique.   COMPARISON:  Chest CT dated 08/10/2023.   FINDINGS: Evaluation of this exam is limited in the absence of intravenous contrast.   Cardiovascular: There is no cardiomegaly or pericardial effusion. There is 3 vessel coronary vascular calcification. There is dilatation of the ascending aorta measuring up approximately 5 cm in maximal diameter. There is moderate atherosclerotic calcification of the thoracic aorta. The central pulmonary arteries are grossly unremarkable.   Mediastinum/Nodes: No hilar or mediastinal adenopathy. The esophagus is grossly unremarkable. No mediastinal fluid collection.    Lungs/Pleura: Bibasilar linear atelectasis/scarring. No focal consolidation, pleural effusion, or pneumothorax. The central airways are patent.   Upper Abdomen: Nonobstructing bilateral renal calculi.   Musculoskeletal: Osteopenia with degenerative changes of spine. No acute osseous pathology.   IMPRESSION: 1. Dilatation of the ascending aorta measuring up approximately 5 cm in maximal diameter. Ascending thoracic aortic aneurysm. Recommend semi-annual imaging followup by CTA or MRA and referral to cardiothoracic surgery if not already obtained. This recommendation follows 2010 ACCF/AHA/AATS/ACR/ASA/SCA/SCAI/SIR/STS/SVM Guidelines for the Diagnosis and Management of Patients With Thoracic Aortic Disease. Circulation. 2010; 121: [id number]. Aortic aneurysm NOS (ICD10-I71.9) 2. No acute intrathoracic pathology. 3. Nonobstructing bilateral renal calculi. 4.  Aortic Atherosclerosis (ICD10-I70.0).     Electronically Signed   By: Vanetta Chou M.D.   On: 05/07/2024 14:22      Impression:  This 83 year old gentleman has a stable 5 cm fusiform ascending aortic aneurysm.  Previous echocardiogram in 2021 showed a trileaflet aortic valve with mild insufficiency. His aneurysm is still well below the surgical threshold of 5.5 cm.  I reviewed the CT images with him and answered all of his questions.  I stressed the importance of continued good blood pressure control in preventing further enlargement and acute aortic dissection.  I advised him against doing any heavy lifting that may require a Valsalva maneuver and could suddenly raise his blood pressure to high levels.   Plan:  He will return to see me in 1 year with a CTA of the chest for aortic surveillance.  I spent 15 minutes performing this established patient evaluation and > 50% of this time was spent face to face counseling and coordinating the care of this patient's aortic aneurysm.    Joseph MARLA Fellers, MD [facility] [phone]

## 2024-06-23 ENCOUNTER — Other Ambulatory Visit: Payer: Self-pay | Admitting: Nurse Practitioner

## 2024-06-30 ENCOUNTER — Ambulatory Visit (HOSPITAL_COMMUNITY)
Admission: RE | Admit: 2024-06-30 | Discharge: 2024-06-30 | Disposition: A | Discharge status: Home / Self Care | Source: Ambulatory Visit | Attending: Urology | Admitting: Urology

## 2024-06-30 ENCOUNTER — Ambulatory Visit: Payer: Medicare HMO | Admitting: Urology

## 2024-06-30 ENCOUNTER — Encounter: Payer: Self-pay | Admitting: Urology

## 2024-06-30 VITALS — BP 146/75 | HR 97

## 2024-06-30 DIAGNOSIS — N390 Urinary tract infection, site not specified: Secondary | ICD-10-CM

## 2024-06-30 DIAGNOSIS — N200 Calculus of kidney: Secondary | ICD-10-CM | POA: Insufficient documentation

## 2024-06-30 DIAGNOSIS — Z87438 Personal history of other diseases of male genital organs: Secondary | ICD-10-CM

## 2024-06-30 DIAGNOSIS — R350 Frequency of micturition: Secondary | ICD-10-CM | POA: Diagnosis not present

## 2024-06-30 DIAGNOSIS — N401 Enlarged prostate with lower urinary tract symptoms: Secondary | ICD-10-CM

## 2024-06-30 DIAGNOSIS — I878 Other specified disorders of veins: Secondary | ICD-10-CM | POA: Diagnosis not present

## 2024-06-30 DIAGNOSIS — Z87440 Personal history of urinary (tract) infections: Secondary | ICD-10-CM | POA: Diagnosis not present

## 2024-06-30 LAB — URINALYSIS, ROUTINE W REFLEX MICROSCOPIC
Bilirubin, UA: NEGATIVE
Glucose, UA: NEGATIVE
Ketones, UA: NEGATIVE
Leukocytes,UA: NEGATIVE
Nitrite, UA: NEGATIVE
Protein,UA: NEGATIVE
RBC, UA: NEGATIVE
Specific Gravity, UA: 1.02 (ref 1.005–1.030)
Urobilinogen, Ur: 1 mg/dL (ref 0.2–1.0)
pH, UA: 6 (ref 5.0–7.5)

## 2024-06-30 LAB — BLADDER SCAN AMB NON-IMAGING: Scan Result: 44

## 2024-06-30 NOTE — Progress Notes (Signed)
 Name: Joseph [name] DOB: [birth year] MRN: [id number]  History of Present Illness: Joseph [name] is a 83 y.o. male who presents today for follow up visit at [facility].  Relevant History includes: 1. BPH with LUTS (urinary urgency, frequency, nocturia). - Last known PSA was normal on 03/22/2013 (3.68). 2. Kidney stone (recurrent).  - 12/23/2023: RUS showed a 7 mm left kidney stone. 3. Recurrent UTI. - Risk factors include likely intermittent incomplete bladder emptying secondary to his constipation / BPH. 4. Recurrent balanitis.  - Uses Clotrimazole  PRN.  5. Chronic constipation. - Takes laxatives PRN. - Prior episode of fecal impaction a few months ago requiring manual disimpaction in ER.   Urine culture results in past 12 months: - 08/18/2023: Positive for >100k Staphylococcus lugdunensis - 10/21/2023: Positive for >100k Staphylococcus lugdunensis - 11/10/2023: Positive for >100k Staphylococcus lugdunensis  - 11/17/2023: Negative; treated with Nitrofurantoin    At last visit on 01/01/2024: - Urinary symptoms improved on Uroxatral  10 mg daily.  - No recent balanitis flares. - Advised the following: 1. Continue Uroxatral  10 mg daily. 2. Ongoing constipation management. 3. Clotrimazole  PRN for balanitis. 4. Maintain adequate daily fluid intake. 5. Return in about 6 months (around 06/30/2024) for KUB, UA, PVR, & f/u with Joseph [name].  Today: KUB today: Awaiting radiology read; left intrarenal stone appears stable per Urology provider interpretation.  He reports 0 UTIs since last visit.  He reports urinary urgency, frequency, nocturia x1 - states it  is not significantly bothersome. Denies, dysuria, gross hematuria, weak urinary stream, hesitancy, straining to void, or sensations of incomplete emptying.  He denies balanitis concerns at this time.  He denies recent suspected stone migration / passage. He denies acute flank pain / abdominal  pain.   Medications: Current Outpatient Medications  Medication Sig Dispense Refill   alfuzosin  (UROXATRAL ) 10 MG 24 hr tablet Take 1 tablet (10 mg total) by mouth daily with breakfast. 30 tablet 11   allopurinol  (ZYLOPRIM ) 300 MG tablet Take 1 tablet by mouth once daily 90 tablet 0   benazepril  (LOTENSIN ) 20 MG tablet Take 1 tablet (20 mg total) by mouth daily. 90 tablet 1   clonazePAM  (KLONOPIN ) 0.5 MG tablet Take 1 tablet (0.5 mg total) by mouth 2 (two) times daily as needed. 60 tablet 5   clotrimazole  (CLOTRIMAZOLE  AF) 1 % cream Apply to head of penis 2 times per day as needed for balanitis (redness / warmth / swelling of foreskin). 180 g 0   fish oil-omega-3 fatty acids 1000 MG capsule Take 2 g by mouth daily.     furosemide  (LASIX ) 40 MG tablet Take 0.5 tablets (20 mg total) by mouth daily as needed for edema. 90 tablet 1   meclizine  (ANTIVERT ) 25 MG tablet TAKE 1 TABLET BY MOUTH THREE TIMES DAILY AS NEEDED FOR DIZZINESS 30 tablet 1   metoprolol  succinate (TOPROL -XL) 25 MG 24 hr tablet Take 1 tablet (25 mg total) by mouth daily. 90 tablet 1   Multiple Vitamins-Minerals (COMPLETE MULTIVITAMIN/MINERAL PO) Take 1 tablet by mouth daily.     NIFEdipine  (PROCARDIA -XL/NIFEDICAL-XL) 30 MG 24 hr tablet Take 1 tablet by mouth once daily 90 tablet 0   rosuvastatin  (CRESTOR ) 5 MG tablet Take 1 tablet (5 mg total) by mouth every other day. 45 tablet 1   traMADol  (ULTRAM ) 50 MG tablet Take 1 tablet (50 mg total) by mouth 3 (three) times daily as needed. 90 tablet 2   traZODone  (DESYREL ) 50 MG tablet Take 2 tablets (  100 mg total) by mouth at bedtime. 180 tablet 1   No current facility-administered medications for this visit.    Allergies: Allergies  Allergen Reactions   Carafate [Sucralfate] Other (See Comments)    TOOK SKIN FROM HIS MOUTH   Ciprofloxacin Hcl Other (See Comments)    CAN'T REMEMBER   Prevacid [Lansoprazole] Other (See Comments)    MADE HIM FEEL BAD   Quinolones     Patient  was warned about not using Cipro and similar antibiotics. Recent studies have raised concern that fluoroquinolone antibiotics could be associated with an increased risk of aortic aneurysm Fluoroquinolones have non-antimicrobial properties that might jeopardise the integrity of the extracellular matrix of the vascular wall In a  propensity score matched cohort study in [place], there was a 66% increased rate of aortic aneurysm or dissection associated with oral fluoroquinolone use, compared wit   Sulfa Antibiotics Diarrhea    SEVERE    Past Medical History:  Diagnosis Date   Anxiety    Aortic atherosclerosis (HCC)    Ascending aortic aneurysm (HCC)    CAD (coronary artery disease)    Mild CAD seen on cardiac cath in 2016   Essential hypertension    GERD (gastroesophageal reflux disease)    Gout    History of kidney stones    HLD (hyperlipidemia)    Insomnia    Obesity (BMI 30-39.9)    Prolonged Q-T interval on ECG    Past Surgical History:  Procedure Laterality Date   CARDIAC CATHETERIZATION N/A 06/27/2015   Procedure: Left Heart Cath and Coronary Angiography;  Surgeon: Debby DELENA Sor, MD;  Location: [facility] INVASIVE CV LAB;  Service: Cardiovascular;  Laterality: N/A;   HERNIA REPAIR     KIDNEY STONE SURGERY     Family History  Problem Relation Age of Onset   Heart disease Father    Heart attack Father    Diabetes Sister    Heart disease Brother    Deep vein thrombosis Brother    Congestive Heart Failure Brother    Cancer Brother    Leukemia Brother    Heart attack Brother    Social History   Socioeconomic History   Marital status: Significant Other    Spouse name: Not on file   Number of children: 2   Years of education: 10   Highest education level: 10th grade  Occupational History   Occupation: Retired    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, forensic  Tobacco Use   Smoking status: Former    Current packs/day: 0.00    Average packs/day: 0.5 packs/day for 1  year (0.5 ttl pk-yrs)    Types: Cigarettes    Start date: 03/22/1957    Quit date: 03/22/1958    Years since quitting: 66.3   Smokeless tobacco: Former    Types: Chew   Tobacco comments:    QUIT CHEWING IN 1989  Vaping Use   Vaping status: Never Used  Substance and Sexual Activity   Alcohol use: No    Alcohol/week: 0.0 standard drinks of alcohol   Drug use: No   Sexual activity: Not on file  Other Topics Concern   Not on file  Social History Narrative   Lives alone, but sometimes [place], significant other stays with him; He has 2 daughters who visit often   Right handed    Caffeine use: none   Social Drivers of Corporate investment banker Strain: Low Risk  (05/18/2024)   Overall Physicist, medical Strain (  CARDIA)    Difficulty of Paying Living Expenses: Not hard at all  Food Insecurity: No Food Insecurity (05/18/2024)   Hunger Vital Sign    Worried About Running Out of Food in the Last Year: Never true    Ran Out of Food in the Last Year: Never true  Transportation Needs: No Transportation Needs (05/18/2024)   PRAPARE - Administrator, Civil Service (Medical): No    Lack of Transportation (Non-Medical): No  Physical Activity: Insufficiently Active (05/18/2024)   Exercise Vital Sign    Days of Exercise per Week: 3 days    Minutes of Exercise per Session: 20 min  Stress: No Stress Concern Present (05/18/2024)   [employer] of Occupational Health - Occupational Stress Questionnaire    Feeling of Stress: Not at all  Social Connections: Moderately Isolated (05/18/2024)   Social Connection and Isolation Panel    Frequency of Communication with Friends and Family: More than three times a week    Frequency of Social Gatherings with Friends and Family: More than three times a week    Attends Religious Services: More than 4 times per year    Active Member of [employer] or Organizations: No    Attends Banker Meetings: Never    Marital Status: Widowed   Intimate Partner Violence: Not At Risk (05/18/2024)   Humiliation, Afraid, Rape, and Kick questionnaire    Fear of Current or Ex-Partner: No    Emotionally Abused: No    Physically Abused: No    Sexually Abused: No    Review of Systems Constitutional: Patient denies any unintentional weight loss or change in strength lntegumentary: Patient denies any rashes or pruritus Cardiovascular: Patient denies chest pain or syncope Respiratory: Patient denies shortness of breath Gastrointestinal: Patient reports constipation  Musculoskeletal: Patient denies muscle cramps or weakness Neurologic: Patient denies convulsions or seizures Allergic/Immunologic: Patient denies recent allergic reaction(s) Hematologic/Lymphatic: Patient denies bleeding tendencies Endocrine: Patient denies heat/cold intolerance  GU: As per HPI.  OBJECTIVE Vitals:   06/30/24 1059  BP: (!) 146/75  Pulse: 97   There is no height or weight on file to calculate BMI.  Physical Examination Constitutional: No obvious distress; patient is non-toxic appearing  Cardiovascular: No visible lower extremity edema.  Respiratory: The patient does not have audible wheezing/stridor; respirations do not appear labored  Gastrointestinal: Abdomen non-distended Musculoskeletal: Normal ROM of UEs  Skin: No obvious rashes/open sores  Neurologic: CN 2-12 grossly intact Psychiatric: Answered questions appropriately with normal affect  Hematologic/Lymphatic/Immunologic: No obvious bruises or sites of spontaneous bleeding  UA: negative  PVR: 44 ml  ASSESSMENT Benign prostatic hyperplasia with urinary frequency - Plan: Urinalysis, Routine w reflex microscopic, BLADDER SCAN AMB NON-IMAGING  Recurrent UTI  Kidney stones - Plan: DG Abd 1 View  History of balanitis  Doing well; no acute findings. Will continue current treatment regimen and plan for follow up in 1 year with KUB for stone surveillance or sooner if needed. Pt verbalized  understanding and agreement. All questions were answered.  PLAN Advised the following: 1. Continue Uroxatral  10 mg daily. 2. Ongoing constipation management. 3. Clotrimazole  PRN for balanitis. 4. Maintain adequate daily fluid intake. 6. Return in about 1 year (around 06/30/2025) for BPH, Stone, with UA & PVR.  Orders Placed This Encounter  Procedures   DG Abd 1 View    Standing Status:   Future    Expected Date:   06/30/2025    Expiration Date:   06/30/2025  Reason for Exam (SYMPTOM  OR DIAGNOSIS REQUIRED):   kidney stone    Preferred imaging location?:   [facility]   Urinalysis, Routine w reflex microscopic   BLADDER SCAN AMB NON-IMAGING    It has been explained that the patient is to follow regularly with their PCP in addition to all other providers involved in their care and to follow instructions provided by these respective offices. Patient advised to contact urology clinic if any urologic-pertaining questions, concerns, new symptoms or problems arise in the interim period.  There are no Patient Instructions on file for this visit.  Electronically signed by:  Joseph JAYSON Oz, FNP   06/30/24    11:22 AM

## 2024-08-10 ENCOUNTER — Other Ambulatory Visit: Payer: Self-pay | Admitting: Nurse Practitioner

## 2024-08-10 DIAGNOSIS — G629 Polyneuropathy, unspecified: Secondary | ICD-10-CM

## 2024-08-12 ENCOUNTER — Other Ambulatory Visit: Payer: Self-pay | Admitting: Nurse Practitioner

## 2024-08-12 DIAGNOSIS — G629 Polyneuropathy, unspecified: Secondary | ICD-10-CM

## 2024-08-16 ENCOUNTER — Encounter: Payer: Self-pay | Admitting: Nurse Practitioner

## 2024-08-16 ENCOUNTER — Ambulatory Visit: Admitting: Nurse Practitioner

## 2024-08-16 VITALS — BP 153/76 | HR 81 | Temp 97.9°F | Ht 68.0 in | Wt 253.0 lb

## 2024-08-16 DIAGNOSIS — R300 Dysuria: Secondary | ICD-10-CM

## 2024-08-16 DIAGNOSIS — M1A09X0 Idiopathic chronic gout, multiple sites, without tophus (tophi): Secondary | ICD-10-CM | POA: Diagnosis not present

## 2024-08-16 DIAGNOSIS — Z0001 Encounter for general adult medical examination with abnormal findings: Secondary | ICD-10-CM | POA: Diagnosis not present

## 2024-08-16 DIAGNOSIS — E785 Hyperlipidemia, unspecified: Secondary | ICD-10-CM | POA: Diagnosis not present

## 2024-08-16 DIAGNOSIS — F5101 Primary insomnia: Secondary | ICD-10-CM | POA: Diagnosis not present

## 2024-08-16 DIAGNOSIS — F411 Generalized anxiety disorder: Secondary | ICD-10-CM | POA: Diagnosis not present

## 2024-08-16 DIAGNOSIS — R600 Localized edema: Secondary | ICD-10-CM

## 2024-08-16 DIAGNOSIS — I7121 Aneurysm of the ascending aorta, without rupture: Secondary | ICD-10-CM

## 2024-08-16 DIAGNOSIS — F339 Major depressive disorder, recurrent, unspecified: Secondary | ICD-10-CM

## 2024-08-16 DIAGNOSIS — N401 Enlarged prostate with lower urinary tract symptoms: Secondary | ICD-10-CM

## 2024-08-16 DIAGNOSIS — G629 Polyneuropathy, unspecified: Secondary | ICD-10-CM

## 2024-08-16 DIAGNOSIS — Z0000 Encounter for general adult medical examination without abnormal findings: Secondary | ICD-10-CM

## 2024-08-16 DIAGNOSIS — I10 Essential (primary) hypertension: Secondary | ICD-10-CM | POA: Diagnosis not present

## 2024-08-16 DIAGNOSIS — R350 Frequency of micturition: Secondary | ICD-10-CM | POA: Diagnosis not present

## 2024-08-16 LAB — URINALYSIS, ROUTINE W REFLEX MICROSCOPIC
Bilirubin, UA: NEGATIVE
Glucose, UA: NEGATIVE
Ketones, UA: NEGATIVE
Leukocytes,UA: NEGATIVE
Nitrite, UA: NEGATIVE
Protein,UA: NEGATIVE
RBC, UA: NEGATIVE
Specific Gravity, UA: 1.015 (ref 1.005–1.030)
Urobilinogen, Ur: 0.2 mg/dL (ref 0.2–1.0)
pH, UA: 5.5 (ref 5.0–7.5)

## 2024-08-16 MED ORDER — BENAZEPRIL HCL 20 MG PO TABS: 20.0000 mg | ORAL_TABLET | Freq: Every day | ORAL | 1 refills | Status: AC

## 2024-08-16 MED ORDER — FUROSEMIDE 40 MG PO TABS: 20.0000 mg | ORAL_TABLET | Freq: Every day | ORAL | 1 refills | Status: AC | PRN

## 2024-08-16 MED ORDER — NIFEDIPINE ER OSMOTIC RELEASE 30 MG PO TB24: 30.0000 mg | ORAL_TABLET | Freq: Every day | ORAL | 1 refills | Status: AC

## 2024-08-16 MED ORDER — ALLOPURINOL 300 MG PO TABS: 300.0000 mg | ORAL_TABLET | Freq: Every day | ORAL | 1 refills | Status: DC

## 2024-08-16 MED ORDER — ROSUVASTATIN CALCIUM 5 MG PO TABS
5.0000 mg | ORAL_TABLET | ORAL | 1 refills | Status: AC
Start: 2024-08-16 — End: ?

## 2024-08-16 MED ORDER — CLONAZEPAM 0.5 MG PO TABS: 0.5000 mg | ORAL_TABLET | Freq: Two times a day (BID) | ORAL | 5 refills | Status: AC | PRN

## 2024-08-16 MED ORDER — TRAZODONE HCL 50 MG PO TABS: 100.0000 mg | ORAL_TABLET | Freq: Every day | ORAL | 1 refills | Status: AC

## 2024-08-16 MED ORDER — TRAMADOL HCL 50 MG PO TABS: 50.0000 mg | ORAL_TABLET | Freq: Three times a day (TID) | ORAL | 2 refills | Status: DC | PRN

## 2024-08-16 NOTE — Patient Instructions (Signed)
 Fall Prevention in the Home, Adult Falls can cause injuries and can happen to people of all ages. There are many things you can do to make your home safer and to help prevent falls. What actions can I take to prevent falls? General information Use good lighting in all rooms. Make sure to: Replace any light bulbs that burn out. Turn on the lights in dark areas and use night-lights. Keep items that you use often in easy-to-reach places. Lower the shelves around your home if needed. Move furniture so that there are clear paths around it. Do not use throw rugs or other things on the floor that can make you trip. If any of your floors are uneven, fix them. Add color or contrast paint or tape to clearly mark and help you see: Grab bars or handrails. First and last steps of staircases. Where the edge of each step is. If you use a ladder or stepladder: Make sure that it is fully opened. Do not climb a closed ladder. Make sure the sides of the ladder are locked in place. Have someone hold the ladder while you use it. Know where your pets are as you move through your home. What can I do in the bathroom?     Keep the floor dry. Clean up any water on the floor right away. Remove soap buildup in the bathtub or shower. Buildup makes bathtubs and showers slippery. Use non-skid mats or decals on the floor of the bathtub or shower. Attach bath mats securely with double-sided, non-slip rug tape. If you need to sit down in the shower, use a non-slip stool. Install grab bars by the toilet and in the bathtub and shower. Do not use towel bars as grab bars. What can I do in the bedroom? Make sure that you have a light by your bed that is easy to reach. Do not use any sheets or blankets on your bed that hang to the floor. Have a firm chair or bench with side arms that you can use for support when you get dressed. What can I do in the kitchen? Clean up any spills right away. If you need to reach something  above you, use a step stool with a grab bar. Keep electrical cords out of the way. Do not use floor polish or wax that makes floors slippery. What can I do with my stairs? Do not leave anything on the stairs. Make sure that you have a light switch at the top and the bottom of the stairs. Make sure that there are handrails on both sides of the stairs. Fix handrails that are broken or loose. Install non-slip stair treads on all your stairs if they do not have carpet. Avoid having throw rugs at the top or bottom of the stairs. Choose a carpet that does not hide the edge of the steps on the stairs. Make sure that the carpet is firmly attached to the stairs. Fix carpet that is loose or worn. What can I do on the outside of my home? Use bright outdoor lighting. Fix the edges of walkways and driveways and fix any cracks. Clear paths of anything that can make you trip, such as tools or rocks. Add color or contrast paint or tape to clearly mark and help you see anything that might make you trip as you walk through a door, such as a raised step or threshold. Trim any bushes or trees on paths to your home. Check to see if handrails are loose  or broken and that both sides of all steps have handrails. Install guardrails along the edges of any raised decks and porches. Have leaves, snow, or ice cleared regularly. Use sand, salt, or ice melter on paths if you live where there is ice and snow during the winter. Clean up any spills in your garage right away. This includes grease or oil spills. What other actions can I take? Review your medicines with your doctor. Some medicines can cause dizziness or changes in blood pressure, which increase your risk of falling. Wear shoes that: Have a low heel. Do not wear high heels. Have rubber bottoms and are closed at the toe. Feel good on your feet and fit well. Use tools that help you move around if needed. These include: Canes. Walkers. Scooters. Crutches. Ask  your doctor what else you can do to help prevent falls. This may include seeing a physical therapist to learn to do exercises to move better and get stronger. Where to find more information Centers for Disease Control and Prevention, [employer]: TonerPromos.no [employer] on Aging: BaseRingTones.pl National Institute on Aging: BaseRingTones.pl Contact a doctor if: You are afraid of falling at home. You feel weak, drowsy, or dizzy at home. You fall at home. Get help right away if you: Lose consciousness or have trouble moving after a fall. Have a fall that causes a head injury. These symptoms may be an emergency. Get help right away. Call 911. Do not wait to see if the symptoms will go away. Do not drive yourself to the hospital. This information is not intended to replace advice given to you by your health care provider. Make sure you discuss any questions you have with your health care provider. Document Revised: 07/21/2022 Document Reviewed: 07/21/2022 Elsevier Patient Education  2024 [employer].

## 2024-08-16 NOTE — Addendum Note (Signed)
 Addended by: GLADIS MUSTARD on: 08/16/2024 10:24 AM   Modules accepted: Orders

## 2024-08-16 NOTE — Progress Notes (Signed)
 Subjective:    Patient ID: Joseph [name], male    DOB: 10/05/1941, 83 y.o.   MRN: [id number]   Chief Complaint: annual physical    HPI:  Joseph [name] is a 83 y.o. who identifies as a male who was assigned male at birth.   Social history: Lives with: by himself- has a girlfriend that he stays with often Work history: retired   Water engineer in today for follow up of the following chronic medical issues:  1. Primary hypertension Np c/o chest pain, sob or headache. Does not check blood pressure at home. BP Readings from Last 3 Encounters:  06/30/24 (!) 146/75  06/22/24 137/76  05/18/24 132/75     2. Aneurysm of ascending aorta without rupture (HCC) Last chest CT was done on 05/07/24. Aneurysm had gronw from 4.8cm to 5.0 cm. recommend repeat ct in 6 months  3. Asymptomatic varicose veins of both lower extremities Legs just feel heavy at times but otherwise do not bother him.  4. Raynaud's disease without gangrene DOes not bother him in the summer  5. Hyperlipidemia with target LDL less than 100 Does not watch diet very closely and does no exercise at all. Lab Results  Component Value Date   CHOL 141 02/12/2024   HDL 41 02/12/2024   LDLCALC 79 02/12/2024   TRIG 115 02/12/2024   CHOLHDL 3.4 02/12/2024      6. Hypokalemia No recent muscle cramps Lab Results  Component Value Date   K 4.7 02/12/2024     7. Polyneuropathy Feet hurt and burn all the time. Takes ultram  daily  Pain assessment: Cause of pain- DDD Pain location- lower back Pain on scale of 1-10- 8/10 Frequency- daily What increases pain-to much activity What makes pain Better-pain meds help Effects on ADL - none Any change in general medical condition-none  Current opioids rx- ultram  # meds rx- 60 Effectiveness of current meds-helps Adverse reactions from pain meds-none Morphine  equivalent- 10 MME  Pill count performed-No Last drug screen - 02/16/23 ( high risk q65m, moderate risk q63m, low risk  yearly ) Urine drug screen today- No Was the [facility] reviewed- yes  If yes were their any concerning findings? - no   Overdose risk: 1    07/05/2019   12:01 PM  Opioid Risk   Alcohol 0   Illegal Drugs 0  Rx Drugs 0  Alcohol 0  Illegal Drugs 0  Rx Drugs 0  Age between 16-45 years  0  History of Preadolescent Sexual Abuse 0  Psychological Disease 0  Depression 0  Opioid Risk Tool Scoring 0  Opioid Risk Interpretation Low Risk     Data saved with a previous flowsheet row definition     Pain contract signed on: 02/18/23   8. Benign prostatic hyperplasia without lower urinary tract symptoms No voiding issues Lab Results  Component Value Date   PSA1 2.4 01/18/2021   PSA1 3.3 06/29/2018   PSA1 3.0 02/20/2017   PSA 3.3 01/08/2015   PSA 3.4 10/05/2014   PSA 2.9 06/13/2014      9. Depression, recurrent (HCC) Has had severe depression since his wife died >then 5 years ago. He is on lexapro . Some days are good and some days are a struggle.    08/16/2024   10:07 AM 08/16/2024   10:06 AM 05/18/2024    2:23 PM  Depression screen PHQ 2/9  Decreased Interest 0 0 0  Down, Depressed, Hopeless 0 0 0  PHQ - 2 Score  0 0 0  Altered sleeping 0  0  Tired, decreased energy 0  0  Change in appetite 0  0  Feeling bad or failure about yourself  0  0  Trouble concentrating 0  0  Moving slowly or fidgety/restless 0  0  Suicidal thoughts 0  0  PHQ-9 Score 0  0  Difficult doing work/chores Not difficult at all  Not difficult at all       10. GAD (generalized anxiety disorder) Is on klonopin  2x a day. Gets very nervous without meds    08/16/2024   10:07 AM 05/16/2024    9:48 AM 02/12/2024   11:43 AM 10/21/2023   12:12 PM  GAD 7 : Generalized Anxiety Score  Nervous, Anxious, on Edge 1 1 0 1  Control/stop worrying 0 0 0 0  Worry too much - different things 0 0 0 0  Trouble relaxing 0 0 0 0  Restless 0 0 0 0  Easily annoyed or irritable 0 0 0 0  Afraid - awful might happen 0 0 0  0  Total GAD 7 Score 1 1 0 1  Anxiety Difficulty Not difficult at all Not difficult at all Not difficult at all Not difficult at all      11. Peripheral edema Has daily edema of lower ext.  12. Primary insomnia Is on trazadone to sleep. Sleep good most nights  13. Idiopathic chronic gout of multiple sites without tophus No recent gout flareup.  14. Severe obesity (BMI >= 40) (HCC) Weight is up 21lbs  Wt Readings from Last 3 Encounters:  06/22/24 253 lb (114.8 kg)  05/18/24 253 lb (114.8 kg)  05/16/24 253 lb (114.8 kg)   BMI Readings from Last 3 Encounters:  06/22/24 38.47 kg/m  05/18/24 38.47 kg/m  05/16/24 38.47 kg/m      New complaints: None today  Allergies  Allergen Reactions   Carafate [Sucralfate] Other (See Comments)    TOOK SKIN FROM HIS MOUTH   Ciprofloxacin Hcl Other (See Comments)    CAN'T REMEMBER   Prevacid [Lansoprazole] Other (See Comments)    MADE HIM FEEL BAD   Quinolones     Patient was warned about not using Cipro and similar antibiotics. Recent studies have raised concern that fluoroquinolone antibiotics could be associated with an increased risk of aortic aneurysm Fluoroquinolones have non-antimicrobial properties that might jeopardise the integrity of the extracellular matrix of the vascular wall In a  propensity score matched cohort study in [place], there was a 66% increased rate of aortic aneurysm or dissection associated with oral fluoroquinolone use, compared wit   Sulfa Antibiotics Diarrhea    SEVERE   Outpatient Encounter Medications as of 08/16/2024  Medication Sig   alfuzosin  (UROXATRAL ) 10 MG 24 hr tablet Take 1 tablet (10 mg total) by mouth daily with breakfast.   allopurinol  (ZYLOPRIM ) 300 MG tablet Take 1 tablet by mouth once daily   benazepril  (LOTENSIN ) 20 MG tablet Take 1 tablet (20 mg total) by mouth daily.   clonazePAM  (KLONOPIN ) 0.5 MG tablet Take 1 tablet (0.5 mg total) by mouth 2 (two) times daily as needed.    clotrimazole  (CLOTRIMAZOLE  AF) 1 % cream Apply to head of penis 2 times per day as needed for balanitis (redness / warmth / swelling of foreskin).   fish oil-omega-3 fatty acids 1000 MG capsule Take 2 g by mouth daily.   furosemide  (LASIX ) 40 MG tablet Take 0.5 tablets (20 mg total) by mouth daily as needed  for edema.   meclizine  (ANTIVERT ) 25 MG tablet TAKE 1 TABLET BY MOUTH THREE TIMES DAILY AS NEEDED FOR DIZZINESS   metoprolol  succinate (TOPROL -XL) 25 MG 24 hr tablet Take 1 tablet (25 mg total) by mouth daily.   Multiple Vitamins-Minerals (COMPLETE MULTIVITAMIN/MINERAL PO) Take 1 tablet by mouth daily.   NIFEdipine  (PROCARDIA -XL/NIFEDICAL-XL) 30 MG 24 hr tablet Take 1 tablet by mouth once daily   rosuvastatin  (CRESTOR ) 5 MG tablet Take 1 tablet (5 mg total) by mouth every other day.   traMADol  (ULTRAM ) 50 MG tablet Take 1 tablet (50 mg total) by mouth 3 (three) times daily as needed.   traZODone  (DESYREL ) 50 MG tablet Take 2 tablets (100 mg total) by mouth at bedtime.   No facility-administered encounter medications on file as of 08/16/2024.    Past Surgical History:  Procedure Laterality Date   CARDIAC CATHETERIZATION N/A 06/27/2015   Procedure: Left Heart Cath and Coronary Angiography;  Surgeon: Debby DELENA Sor, MD;  Location: [facility] INVASIVE CV LAB;  Service: Cardiovascular;  Laterality: N/A;   HERNIA REPAIR     KIDNEY STONE SURGERY      Family History  Problem Relation Age of Onset   Heart disease Father    Heart attack Father    Diabetes Sister    Heart disease Brother    Deep vein thrombosis Brother    Congestive Heart Failure Brother    Cancer Brother    Leukemia Brother    Heart attack Brother       Controlled substance contract: n/a     Review of Systems  Constitutional:  Negative for diaphoresis.  Eyes:  Negative for pain.  Respiratory:  Negative for shortness of breath.   Cardiovascular:  Negative for chest pain, palpitations and leg swelling.  Gastrointestinal:   Negative for abdominal pain.  Endocrine: Negative for polydipsia.  Skin:  Negative for rash.  Neurological:  Negative for dizziness, weakness and headaches.  Hematological:  Does not bruise/bleed easily.  All other systems reviewed and are negative.      Objective:   Physical Exam Vitals and nursing note reviewed.  Constitutional:      Appearance: Normal appearance. He is well-developed.  HENT:     Head: Normocephalic.     Nose: Nose normal.     Mouth/Throat:     Mouth: Mucous membranes are moist.     Pharynx: Oropharynx is clear.  Eyes:     Pupils: Pupils are equal, round, and reactive to light.  Neck:     Thyroid : No thyroid  mass or thyromegaly.     Vascular: No carotid bruit or JVD.     Trachea: Phonation normal.  Cardiovascular:     Rate and Rhythm: Normal rate and regular rhythm.  Pulmonary:     Effort: Pulmonary effort is normal. No respiratory distress.     Breath sounds: Normal breath sounds.  Abdominal:     General: Bowel sounds are normal.     Palpations: Abdomen is soft.     Tenderness: There is no abdominal tenderness.  Musculoskeletal:        General: Normal range of motion.     Cervical back: Normal range of motion and neck supple.     Comments: FROM of left hand without pain. Fist strong  Lymphadenopathy:     Cervical: No cervical adenopathy.  Skin:    General: Skin is warm and dry.  Neurological:     Mental Status: He is alert and oriented to person, place, and  time.  Psychiatric:        Behavior: Behavior normal.        Thought Content: Thought content normal.        Judgment: Judgment normal.     BP (!) 153/76   Pulse 81   Temp 97.9 F (36.6 C) (Temporal)   Ht 5' 8 (1.727 m)   Wt 253 lb (114.8 kg)   SpO2 95%   BMI 38.47 kg/m           Assessment & Plan:   Joseph [name] comes in today with chief complaint of annual physical  Diagnosis and orders addressed:  1. Primary hypertension (Primary) Low sodium diet - benazepril   (LOTENSIN ) 20 MG tablet; Take 1 tablet (20 mg total) by mouth daily.  Dispense: 90 tablet; Refill: 1 - metoprolol  succinate (TOPROL -XL) 25 MG 24 hr tablet; Take 1 tablet (25 mg total) by mouth daily.  Dispense: 90 tablet; Refill: 1 - CBC with Differential/Platelet - CMP14+EGFR  2. Aneurysm of ascending aorta without rupture (HCC) - NIFEdipine  (PROCARDIA -XL/NIFEDICAL-XL) 30 MG 24 hr tablet; Take 1 tablet (30 mg total) by mouth daily.  Dispense: 90 tablet; Refill: 0  3. Polyneuropathy Report any sores on feet - traMADol  (ULTRAM ) 50 MG tablet; Take 1 tablet (50 mg total) by mouth 3 (three) times daily as needed.  Dispense: 90 tablet; Refill: 2  4. Benign prostatic hyperplasia with urinary frequency Report any voiding issues - alfuzosin  (UROXATRAL ) 10 MG 24 hr tablet; Take 1 tablet (10 mg total) by mouth daily with breakfast.  Dispense: 30 tablet; Refill: 11  5. Depression, recurrent (HCC) Stress management  6. GAD (generalized anxiety disorder) Stress management - ToxASSURE Select 13 (MW), Urine - clonazePAM  (KLONOPIN ) 0.5 MG tablet; Take 1 tablet (0.5 mg total) by mouth 2 (two) times daily as needed.  Dispense: 60 tablet; Refill: 0  7. Peripheral edema Elevate legs when sitting - furosemide  (LASIX ) 40 MG tablet; Take 0.5 tablets (20 mg total) by mouth daily as needed for edema.  Dispense: 90 tablet; Refill: 1  8. Primary insomnia Bedtime routine - traZODone  (DESYREL ) 50 MG tablet; Take 2 tablets (100 mg total) by mouth at bedtime.  Dispense: 180 tablet; Refill: 1  9. Idiopathic chronic gout of multiple sites without tophus  10. Severe obesity (BMI >= 40) (HCC) Discussed diet and exercise for person with BMI >25 Will recheck weight in 3-6 months   11. Hyperlipidemia with target LDL less than 100 Low fat diet - rosuvastatin  (CRESTOR ) 5 MG tablet; Take 1 tablet (5 mg total) by mouth every other day.  Dispense: 45 tablet; Refill: 1 - Lipid panel   Labs pending Health  Maintenance reviewed Diet and exercise encouraged  Follow up plan: 3 months   Mary-Margaret Gladis, FNP

## 2024-08-17 ENCOUNTER — Ambulatory Visit: Payer: Self-pay | Admitting: Nurse Practitioner

## 2024-08-17 LAB — PSA, TOTAL AND FREE
PSA, Free Pct: 24.1 %
PSA, Free: 0.53 ng/mL
Prostate Specific Ag, Serum: 2.2 ng/mL (ref 0.0–4.0)

## 2024-08-17 LAB — CMP14+EGFR
ALT: 18 IU/L (ref 0–44)
AST: 17 IU/L (ref 0–40)
Albumin: 4 g/dL (ref 3.7–4.7)
Alkaline Phosphatase: 58 IU/L (ref 48–129)
BUN/Creatinine Ratio: 15 (ref 10–24)
BUN: 14 mg/dL (ref 8–27)
Bilirubin Total: 0.4 mg/dL (ref 0.0–1.2)
CO2: 20 mmol/L (ref 20–29)
Calcium: 9.7 mg/dL (ref 8.6–10.2)
Chloride: 102 mmol/L (ref 96–106)
Creatinine, Ser: 0.93 mg/dL (ref 0.76–1.27)
Globulin, Total: 2.4 g/dL (ref 1.5–4.5)
Glucose: 112 mg/dL — ABNORMAL HIGH (ref 70–99)
Potassium: 4.1 mmol/L (ref 3.5–5.2)
Sodium: 138 mmol/L (ref 134–144)
Total Protein: 6.4 g/dL (ref 6.0–8.5)
eGFR: 82 mL/min/1.73 (ref >59)

## 2024-08-17 LAB — LIPID PANEL
Chol/HDL Ratio: 3.6 ratio (ref 0.0–5.0)
Cholesterol, Total: 134 mg/dL (ref 100–199)
HDL: 37 mg/dL — ABNORMAL LOW (ref >39)
LDL Chol Calc (NIH): 73 mg/dL (ref 0–99)
Triglycerides: 133 mg/dL (ref 0–149)
VLDL Cholesterol Cal: 24 mg/dL (ref 5–40)

## 2024-08-17 LAB — CBC WITH DIFFERENTIAL/PLATELET
Basophils Absolute: 0.1 x10E3/uL (ref 0.0–0.2)
Basos: 1 %
EOS (ABSOLUTE): 0.1 x10E3/uL (ref 0.0–0.4)
Eos: 1 %
Hematocrit: 43.3 % (ref 37.5–51.0)
Hemoglobin: 13.7 g/dL (ref 13.0–17.7)
Immature Grans (Abs): 0 x10E3/uL (ref 0.0–0.1)
Immature Granulocytes: 0 %
Lymphocytes Absolute: 1.1 x10E3/uL (ref 0.7–3.1)
Lymphs: 11 %
MCH: 33.1 pg — ABNORMAL HIGH (ref 26.6–33.0)
MCHC: 31.6 g/dL (ref 31.5–35.7)
MCV: 105 fL — ABNORMAL HIGH (ref 79–97)
Monocytes Absolute: 0.7 x10E3/uL (ref 0.1–0.9)
Monocytes: 8 %
Neutrophils Absolute: 7.6 x10E3/uL — ABNORMAL HIGH (ref 1.4–7.0)
Neutrophils: 79 %
Platelets: 219 x10E3/uL (ref 150–450)
RBC: 4.14 x10E6/uL (ref 4.14–5.80)
RDW: 12.8 % (ref 11.6–15.4)
WBC: 9.6 x10E3/uL (ref 3.4–10.8)

## 2024-08-18 LAB — URINE CULTURE

## 2024-08-25 ENCOUNTER — Other Ambulatory Visit: Payer: Self-pay | Admitting: Nurse Practitioner

## 2024-08-25 DIAGNOSIS — I10 Essential (primary) hypertension: Secondary | ICD-10-CM

## 2024-08-25 DIAGNOSIS — M1A09X0 Idiopathic chronic gout, multiple sites, without tophus (tophi): Secondary | ICD-10-CM

## 2024-09-14 ENCOUNTER — Other Ambulatory Visit: Payer: Self-pay | Admitting: Family Medicine

## 2024-10-10 ENCOUNTER — Telehealth: Payer: Self-pay | Admitting: Urology

## 2024-10-10 NOTE — Telephone Encounter (Signed)
 Wife left message patient has bladder or kidney infection and is out of medication. He is having a hard time getting urine stream started and it hurts to urinate.

## 2024-10-10 NOTE — Telephone Encounter (Signed)
 Tried calling pt with no answer. Left VM for return call

## 2024-10-11 ENCOUNTER — Ambulatory Visit: Admitting: Nurse Practitioner

## 2024-10-11 ENCOUNTER — Encounter: Payer: Self-pay | Admitting: Nurse Practitioner

## 2024-10-11 VITALS — BP 136/75 | HR 101 | Temp 98.5°F | Ht 68.0 in | Wt 253.0 lb

## 2024-10-11 DIAGNOSIS — R300 Dysuria: Secondary | ICD-10-CM | POA: Diagnosis not present

## 2024-10-11 LAB — URINALYSIS, ROUTINE W REFLEX MICROSCOPIC
Bilirubin, UA: NEGATIVE
Glucose, UA: NEGATIVE
Ketones, UA: NEGATIVE
Nitrite, UA: NEGATIVE
Protein,UA: NEGATIVE
RBC, UA: NEGATIVE
Specific Gravity, UA: 1.015 (ref 1.005–1.030)
Urobilinogen, Ur: 0.2 mg/dL (ref 0.2–1.0)
pH, UA: 6 (ref 5.0–7.5)

## 2024-10-11 LAB — MICROSCOPIC EXAMINATION
RBC, Urine: NONE SEEN /HPF (ref 0–2)
Renal Epithel, UA: NONE SEEN /HPF
Yeast, UA: NONE SEEN

## 2024-10-11 NOTE — Progress Notes (Signed)
   Subjective:    Patient ID: Joseph [name], male    DOB: 1941-06-05, 83 y.o.   MRN: [id number]   Chief Complaint: dysuria  Dysuria  This is a new problem. The current episode started in the past 7 days. The problem occurs intermittently. The problem has been waxing and waning. He is Not sexually active. There is No history of pyelonephritis. Associated symptoms include frequency, hesitancy and urgency. He has tried nothing for the symptoms. The treatment provided mild relief. His past medical history is significant for recurrent UTIs.    Patient Active Problem List   Diagnosis Date Noted   History of balanitis 12/16/2023   Chronic constipation 12/16/2023   Depression, recurrent 02/16/2023   Peripheral edema 02/14/2022   GAD (generalized anxiety disorder) 04/17/2021   Polyneuropathy 06/07/2019   Thoracic ascending aortic aneurysm 07/27/2015   Varicose veins of both lower extremities 07/27/2015   Severe obesity (BMI >= 40) (HCC) 04/18/2015   Raynauds disease 04/18/2015   BPH (benign prostatic hyperplasia) 07/10/2014   Gout 04/04/2014   Hypertension 03/22/2013   Hyperlipidemia with target LDL less than 100 03/22/2013   Insomnia 03/22/2013       Review of Systems  Genitourinary:  Positive for dysuria, frequency, hesitancy and urgency.       Objective:   Physical Exam Constitutional:      Appearance: Normal appearance. He is obese.  Cardiovascular:     Rate and Rhythm: Normal rate and regular rhythm.     Heart sounds: Normal heart sounds.  Pulmonary:     Effort: Pulmonary effort is normal.     Breath sounds: Normal breath sounds.  Skin:    General: Skin is warm.  Neurological:     General: No focal deficit present.     Mental Status: He is alert and oriented to person, place, and time.  Psychiatric:        Mood and Affect: Mood normal.        Behavior: Behavior normal.    BP 136/75   Pulse (!) 101   Temp 98.5 F (36.9 C) (Temporal)   Ht 5' 8 (1.727 m)   Wt  253 lb (114.8 kg)   SpO2 90%   BMI 38.47 kg/m         Assessment & Plan:   Joseph [name] in today with chief complaint of Dysuria   1. Dysuria (Primary) Urine is clear Force fluids RTO prn - Urinalysis, Routine w reflex microscopic - Urine Culture    The above assessment and management plan was discussed with the patient. The patient verbalized understanding of and has agreed to the management plan. Patient is aware to call the clinic if symptoms persist or worsen. Patient is aware when to return to the clinic for a follow-up visit. Patient educated on when it is appropriate to go to the emergency department.   Mary-Margaret Gladis, FNP

## 2024-10-13 ENCOUNTER — Ambulatory Visit: Payer: Self-pay | Admitting: Nurse Practitioner

## 2024-10-13 LAB — URINE CULTURE

## 2024-11-07 ENCOUNTER — Other Ambulatory Visit: Payer: Self-pay | Admitting: Nurse Practitioner

## 2024-11-07 DIAGNOSIS — G629 Polyneuropathy, unspecified: Secondary | ICD-10-CM

## 2024-11-15 ENCOUNTER — Ambulatory Visit: Admitting: Nurse Practitioner

## 2024-11-15 ENCOUNTER — Ambulatory Visit: Payer: Self-pay | Admitting: Nurse Practitioner

## 2024-11-15 ENCOUNTER — Encounter: Payer: Self-pay | Admitting: Nurse Practitioner

## 2024-11-15 ENCOUNTER — Ambulatory Visit

## 2024-11-15 VITALS — BP 143/77 | HR 72 | Temp 98.1°F | Ht 68.0 in | Wt 250.0 lb

## 2024-11-15 DIAGNOSIS — F5101 Primary insomnia: Secondary | ICD-10-CM

## 2024-11-15 DIAGNOSIS — G629 Polyneuropathy, unspecified: Secondary | ICD-10-CM

## 2024-11-15 DIAGNOSIS — E785 Hyperlipidemia, unspecified: Secondary | ICD-10-CM

## 2024-11-15 DIAGNOSIS — E876 Hypokalemia: Secondary | ICD-10-CM

## 2024-11-15 DIAGNOSIS — I7300 Raynaud's syndrome without gangrene: Secondary | ICD-10-CM

## 2024-11-15 DIAGNOSIS — M25562 Pain in left knee: Secondary | ICD-10-CM

## 2024-11-15 DIAGNOSIS — I10 Essential (primary) hypertension: Secondary | ICD-10-CM

## 2024-11-15 DIAGNOSIS — F339 Major depressive disorder, recurrent, unspecified: Secondary | ICD-10-CM

## 2024-11-15 DIAGNOSIS — I8393 Asymptomatic varicose veins of bilateral lower extremities: Secondary | ICD-10-CM

## 2024-11-15 DIAGNOSIS — M1712 Unilateral primary osteoarthritis, left knee: Secondary | ICD-10-CM

## 2024-11-15 DIAGNOSIS — N401 Enlarged prostate with lower urinary tract symptoms: Secondary | ICD-10-CM

## 2024-11-15 DIAGNOSIS — I7121 Aneurysm of the ascending aorta, without rupture: Secondary | ICD-10-CM

## 2024-11-15 DIAGNOSIS — F411 Generalized anxiety disorder: Secondary | ICD-10-CM

## 2024-11-15 DIAGNOSIS — R600 Localized edema: Secondary | ICD-10-CM

## 2024-11-15 DIAGNOSIS — M1A09X0 Idiopathic chronic gout, multiple sites, without tophus (tophi): Secondary | ICD-10-CM

## 2024-11-15 MED ORDER — MECLIZINE HCL 25 MG PO TABS: 25.0000 mg | ORAL_TABLET | Freq: Two times a day (BID) | ORAL | 0 refills | Status: AC | PRN

## 2024-11-15 MED ORDER — BUPIVACAINE HCL 0.25 % IJ SOLN
1.0000 mL | Freq: Once | INTRAMUSCULAR | Status: AC
  Administered 2024-11-15: 10:00:00 1 mL via INTRA_ARTICULAR

## 2024-11-15 MED ORDER — TRAMADOL HCL 50 MG PO TABS: 50.0000 mg | ORAL_TABLET | Freq: Three times a day (TID) | ORAL | 2 refills | Status: AC | PRN

## 2024-11-15 MED ORDER — METHYLPREDNISOLONE ACETATE 40 MG/ML IJ SUSP
40.0000 mg | Freq: Once | INTRAMUSCULAR | Status: AC
  Administered 2024-11-15: 10:00:00 40 mg via INTRAMUSCULAR

## 2024-11-15 NOTE — Patient Instructions (Signed)
 Knee Injection A knee injection is a procedure to get medicine in your knee joint to help relieve the pain, swelling, and stiffness that is caused by arthritis. The medicine may also cushion your knee joint. Your health care provider will use a needle to inject the medicine. You may need more than one injection. Tell a health care provider about: Any allergies you have. All medicines you take. These include vitamins, herbs, eye drops, and creams. Any problems you or family members have had with anesthesia. Any bleeding problems you have. Any surgeries you've had. Any medical conditions you have. Whether you're pregnant or may be pregnant. What are the risks? Your provider will talk with you about risks. These may include: Infection. Bleeding. Symptoms that get worse. Damage to the area around your knee. Allergic reaction to the medicines. Skin reactions from having many injections. What happens before the procedure? Ask about changing or stopping: Any medicines you take. Any vitamins, herbs, or supplements you take. Do not take aspirin or ibuprofen unless you're told to. Plan to have a responsible adult drive you home from the hospital or clinic. You won't be allowed to drive. What happens during the procedure?  You will sit or lie down in a position for your knee to be treated. The skin over your kneecap will be cleaned with a soap that kills germs. You will be given a medicine that numbs the area. You may feel some stinging. The medicine will be injected into your knee. The needle is placed between your kneecap and your knee. The medicine is injected into the joint space. The needle will be taken out. A bandage may be placed over the injection site. The procedure may vary among providers and hospitals. What can I expect after the procedure? Your blood pressure, heart rate, breathing rate, and blood oxygen level will be monitored until you leave the hospital or clinic. You may  have to move your knee through its full range of motion. This helps to get the medicine into the joint space. You will be watched to make sure that you do not have a reaction to the injection. You may feel more pain, swelling, and warmth than you did before the injection. This reaction may last about 1-2 days. Follow these instructions at home: Medicines Take over-the-counter and prescription medicines only as told by your provider. Ask your provider if the medicine prescribed to you requires you to avoid driving or using machinery. Do not take medicines such as aspirin and ibuprofen unless your provider tells you to. Injection site care Follow instructions from your provider about: How to take care of your injection site. When and how you should change your bandage. When you should take off your bandage. Check your injection area every day for signs of infection. Check for: More redness, swelling, or pain after 2 days. Fluid or blood. Warmth. Pus or a bad smell. Managing pain, stiffness, and swelling  Use ice or an ice pack as told. Place a towel between your skin and the ice. Leave the ice on for 20 minutes, 2-3 times a day. If your skin turns red, take off the ice right away to prevent skin damage. The risk of damage is higher if you can't feel pain, heat, or cold. Do not put heat to your knee. Raise your knee above the level of your heart while you're sitting or lying down. General instructions If you have a bandage, keep it dry until your provider says it can be taken  off. Ask your provider when you can start taking showers and baths. Avoid activities that take a lot of effort for as long as told by your provider. Ask what things are safe for you to do at home. Ask when you can go back to work or school. Keep all follow-up visits. You may need more injections. Contact a health care provider if: You have a fever. You have any signs of infection. Your symptoms at the injection  site last longer than 2 days after your procedure. Get help right away if: Your knee turns very red. Your knee becomes very swollen. You have very bad knee pain. This information is not intended to replace advice given to you by your health care provider. Make sure you discuss any questions you have with your health care provider. Document Revised: 03/17/2023 Document Reviewed: 03/17/2023 Elsevier Patient Education  2024 [employer].

## 2024-11-15 NOTE — Progress Notes (Signed)
 Subjective:    Patient ID: Joseph [name], male    DOB: [birth year], 83 y.o.   MRN: [id number]   Chief Complaint: medical management of chronic issues     HPI:  Joseph [name] is a 83 y.o. who identifies as a male who was assigned male at birth.   Social history: Lives with: by himself- has a girlfriend that he stays with often Work history: retired   Water Engineer in today for follow up of the following chronic medical issues:  1. Primary hypertension Np c/o chest pain, sob or headache. Does not check blood pressure at home. BP Readings from Last 3 Encounters:  10/11/24 136/75  08/16/24 (!) 153/76  06/30/24 (!) 146/75     2. Aneurysm of ascending aorta without rupture (HCC) Last chest CT was done on 05/07/24. Aneurysm had gronw from 4.8cm to 5.0 cm. recommend repeat ct in 6 months  3. Asymptomatic varicose veins of both lower extremities Legs just feel heavy at times but otherwise do not bother him.  4. Raynaud's disease without gangrene DOes not bother him in the summer  5. Hyperlipidemia with target LDL less than 100 Does not watch diet very closely and does no exercise at all. Lab Results  Component Value Date   CHOL 134 08/16/2024   HDL 37 (L) 08/16/2024   LDLCALC 73 08/16/2024   TRIG 133 08/16/2024   CHOLHDL 3.6 08/16/2024      6. Hypokalemia No recent muscle cramps Lab Results  Component Value Date   K 4.1 08/16/2024     7. Polyneuropathy Feet hurt and burn all the time. Takes ultram  daily  Pain assessment: Cause of pain- DDD Pain location- lower back Pain on scale of 1-10- 8/10 Frequency- daily What increases pain-to much activity What makes pain Better-pain meds help Effects on ADL - none Any change in general medical condition-none  Current opioids rx- ultram  # meds rx- 60 Effectiveness of current meds-helps Adverse reactions from pain meds-none Morphine  equivalent- 10 MME  Pill count performed-No Last drug screen - 02/16/23 ( high risk q66m,  moderate risk q17m, low risk yearly ) Urine drug screen today- No Was the [facility] reviewed- yes  If yes were their any concerning findings? - no   Overdose risk: 1    07/05/2019   12:01 PM  Opioid Risk   Alcohol 0   Illegal Drugs 0  Rx Drugs 0  Alcohol 0  Illegal Drugs 0  Rx Drugs 0  Age between 16-45 years  0  History of Preadolescent Sexual Abuse 0  Psychological Disease 0  Depression 0  Opioid Risk Tool Scoring 0  Opioid Risk Interpretation Low Risk     Data saved with a previous flowsheet row definition     Pain contract signed on: 02/18/23   8. Benign prostatic hyperplasia without lower urinary tract symptoms No voiding issues Lab Results  Component Value Date   PSA1 2.2 08/16/2024   PSA1 2.4 01/18/2021   PSA1 3.3 06/29/2018   PSA 3.3 01/08/2015   PSA 3.4 10/05/2014   PSA 2.9 06/13/2014      9. Depression, recurrent (HCC) Has had severe depression since his wife died >then 5 years ago. He is on lexapro . Some days are good and some days are a struggle.    11/15/2024    9:19 AM 10/11/2024    3:11 PM 08/16/2024   10:07 AM  Depression screen PHQ 2/9  Decreased Interest 0 0 0  Down, Depressed, Hopeless 0  0 0  PHQ - 2 Score 0 0 0  Altered sleeping 0  0  Tired, decreased energy 0  0  Change in appetite 0  0  Feeling bad or failure about yourself  0  0  Trouble concentrating 0  0  Moving slowly or fidgety/restless 0  0  Suicidal thoughts 0  0  PHQ-9 Score 0  0   Difficult doing work/chores Not difficult at all  Not difficult at all     Data saved with a previous flowsheet row definition       10. GAD (generalized anxiety disorder) Is on klonopin  2x a day. Gets very nervous without meds    11/15/2024    9:19 AM 08/16/2024   10:07 AM 05/16/2024    9:48 AM 02/12/2024   11:43 AM  GAD 7 : Generalized Anxiety Score  Nervous, Anxious, on Edge 0 1 1 0  Control/stop worrying 0 0 0 0  Worry too much - different things 0 0 0 0  Trouble relaxing 0 0 0 0   Restless 0 0 0 0  Easily annoyed or irritable 0 0 0 0  Afraid - awful might happen 0 0 0 0  Total GAD 7 Score 0 1 1 0  Anxiety Difficulty Not difficult at all Not difficult at all Not difficult at all Not difficult at all    11. Peripheral edema Has daily edema of lower ext.  12. Primary insomnia Is on trazadone to sleep. Sleep good most nights  13. Idiopathic chronic gout of multiple sites without tophus No recent gout flareup.  14. Severe obesity (BMI >= 40) (HCC) Weight is up 3 lbs Wt Readings from Last 3 Encounters:  11/15/24 250 lb (113.4 kg)  10/11/24 253 lb (114.8 kg)  08/16/24 253 lb (114.8 kg)   BMI Readings from Last 3 Encounters:  11/15/24 38.01 kg/m  10/11/24 38.47 kg/m  08/16/24 38.47 kg/m    New complaints: Left knee pain. Started hurting over a year ago and seems worse. Pops and cracks when walking. Hurts worse when walking. Feels better when sitting.   Allergies  Allergen Reactions   Carafate [Sucralfate] Other (See Comments)    TOOK SKIN FROM HIS MOUTH   Ciprofloxacin Hcl Other (See Comments)    CAN'T REMEMBER   Prevacid [Lansoprazole] Other (See Comments)    MADE HIM FEEL BAD   Quinolones     Patient was warned about not using Cipro and similar antibiotics. Recent studies have raised concern that fluoroquinolone antibiotics could be associated with an increased risk of aortic aneurysm Fluoroquinolones have non-antimicrobial properties that might jeopardise the integrity of the extracellular matrix of the vascular wall In a  propensity score matched cohort study in [place], there was a 66% increased rate of aortic aneurysm or dissection associated with oral fluoroquinolone use, compared wit   Sulfa Antibiotics Diarrhea    SEVERE   Outpatient Encounter Medications as of 11/15/2024  Medication Sig   alfuzosin  (UROXATRAL ) 10 MG 24 hr tablet Take 1 tablet (10 mg total) by mouth daily with breakfast.   allopurinol  (ZYLOPRIM ) 300 MG tablet Take 1  tablet (300 mg total) by mouth daily.   benazepril  (LOTENSIN ) 20 MG tablet Take 1 tablet (20 mg total) by mouth daily.   clonazePAM  (KLONOPIN ) 0.5 MG tablet Take 1 tablet (0.5 mg total) by mouth 2 (two) times daily as needed.   clotrimazole  (CLOTRIMAZOLE  AF) 1 % cream Apply to head of penis 2 times per day as  needed for balanitis (redness / warmth / swelling of foreskin).   fish oil-omega-3 fatty acids 1000 MG capsule Take 2 g by mouth daily.   furosemide  (LASIX ) 40 MG tablet Take 0.5 tablets (20 mg total) by mouth daily as needed for edema.   meclizine  (ANTIVERT ) 25 MG tablet TAKE 1 TABLET BY MOUTH THREE TIMES DAILY AS NEEDED FOR DIZZINESS   metoprolol  succinate (TOPROL -XL) 25 MG 24 hr tablet Take 1 tablet by mouth once daily   Multiple Vitamins-Minerals (COMPLETE MULTIVITAMIN/MINERAL PO) Take 1 tablet by mouth daily.   NIFEdipine  (PROCARDIA -XL/NIFEDICAL-XL) 30 MG 24 hr tablet Take 1 tablet (30 mg total) by mouth daily.   rosuvastatin  (CRESTOR ) 5 MG tablet Take 1 tablet (5 mg total) by mouth every other day.   traMADol  (ULTRAM ) 50 MG tablet Take 1 tablet (50 mg total) by mouth 3 (three) times daily as needed.   traZODone  (DESYREL ) 50 MG tablet Take 2 tablets (100 mg total) by mouth at bedtime.   No facility-administered encounter medications on file as of 11/15/2024.    Past Surgical History:  Procedure Laterality Date   CARDIAC CATHETERIZATION N/A 06/27/2015   Procedure: Left Heart Cath and Coronary Angiography;  Surgeon: Debby DELENA Sor, MD;  Location: [facility] INVASIVE CV LAB;  Service: Cardiovascular;  Laterality: N/A;   HERNIA REPAIR     KIDNEY STONE SURGERY      Family History  Problem Relation Age of Onset   Heart disease Father    Heart attack Father    Diabetes Sister    Heart disease Brother    Deep vein thrombosis Brother    Congestive Heart Failure Brother    Cancer Brother    Leukemia Brother    Heart attack Brother       Controlled substance contract:  n/a     Review of Systems  Constitutional:  Negative for diaphoresis.  Eyes:  Negative for pain.  Respiratory:  Negative for shortness of breath.   Cardiovascular:  Negative for chest pain, palpitations and leg swelling.  Gastrointestinal:  Negative for abdominal pain.  Endocrine: Negative for polydipsia.  Skin:  Negative for rash.  Neurological:  Negative for dizziness, weakness and headaches.  Hematological:  Does not bruise/bleed easily.  All other systems reviewed and are negative.      Objective:   Physical Exam Vitals and nursing note reviewed.  Constitutional:      Appearance: Normal appearance. He is well-developed.  HENT:     Head: Normocephalic.     Nose: Nose normal.     Mouth/Throat:     Mouth: Mucous membranes are moist.     Pharynx: Oropharynx is clear.  Eyes:     Pupils: Pupils are equal, round, and reactive to light.  Neck:     Thyroid : No thyroid  mass or thyromegaly.     Vascular: No carotid bruit or JVD.     Trachea: Phonation normal.  Cardiovascular:     Rate and Rhythm: Normal rate and regular rhythm.  Pulmonary:     Effort: Pulmonary effort is normal. No respiratory distress.     Breath sounds: Normal breath sounds.  Abdominal:     General: Bowel sounds are normal.     Palpations: Abdomen is soft.     Tenderness: There is no abdominal tenderness.  Musculoskeletal:        General: Normal range of motion.     Cervical back: Normal range of motion and neck supple.     Comments: FROM of left  knee with pain on flexion and extension Mild left knee effusion ALL ligament intact left knee  Lymphadenopathy:     Cervical: No cervical adenopathy.  Skin:    General: Skin is warm and dry.     Comments: Bilateral hands cool to touch  Neurological:     Mental Status: He is alert and oriented to person, place, and time.  Psychiatric:        Behavior: Behavior normal.        Thought Content: Thought content normal.        Judgment: Judgment normal.      BP (!) 143/77   Pulse 72   Temp 98.1 F (36.7 C) (Temporal)   Ht 5' 8 (1.727 m)   Wt 250 lb (113.4 kg)   SpO2 94%   BMI 38.01 kg/m    Moderate osteoarthritis left knee with bone on bone medially-Preliminary reading by Ronal Lunger, FNP  [facility]  Joint Injection/Arthrocentesis  Date/Time: 11/15/2024 9:50 AM  Performed by: Lunger Mustard, FNP Authorized by: Lunger, Mary-Margaret, FNP  Indications: pain and joint swelling  Body area: knee Local anesthesia used: no  Anesthesia: Local anesthesia used: no  Sedation: Patient sedated: no  Preparation: Patient was prepped and draped in the usual sterile fashion. Needle size: 22 G Ultrasound guidance: no Approach: lateral Methylprednisolone  amount: 40 mg Bupivacaine  0.25% amount: 1 mL          Assessment & Plan:   Joseph [name] comes in today with chief complaint of medical management of chronic issues    Diagnosis and orders addressed:  1. Primary hypertension (Primary) Low sodium diet - benazepril  (LOTENSIN ) 20 MG tablet; Take 1 tablet (20 mg total) by mouth daily.  Dispense: 90 tablet; Refill: 1 - metoprolol  succinate (TOPROL -XL) 25 MG 24 hr tablet; Take 1 tablet (25 mg total) by mouth daily.  Dispense: 90 tablet; Refill: 1 - CBC with Differential/Platelet - CMP14+EGFR  2. Aneurysm of ascending aorta without rupture (HCC) - NIFEdipine  (PROCARDIA -XL/NIFEDICAL-XL) 30 MG 24 hr tablet; Take 1 tablet (30 mg total) by mouth daily.  Dispense: 90 tablet; Refill: 0  3. Polyneuropathy Report any sores on feet - traMADol  (ULTRAM ) 50 MG tablet; Take 1 tablet (50 mg total) by mouth 3 (three) times daily as needed.  Dispense: 90 tablet; Refill: 2  4. Benign prostatic hyperplasia with urinary frequency Report any voiding issues - alfuzosin  (UROXATRAL ) 10 MG 24 hr tablet; Take 1 tablet (10 mg total) by mouth daily with breakfast.  Dispense: 30 tablet; Refill: 11  5. Depression, recurrent (HCC) Stress  management  6. GAD (generalized anxiety disorder) Stress management - ToxASSURE Select 13 (MW), Urine - clonazePAM  (KLONOPIN ) 0.5 MG tablet; Take 1 tablet (0.5 mg total) by mouth 2 (two) times daily as needed.  Dispense: 60 tablet; Refill: 0  7. Peripheral edema Elevate legs when sitting - furosemide  (LASIX ) 40 MG tablet; Take 0.5 tablets (20 mg total) by mouth daily as needed for edema.  Dispense: 90 tablet; Refill: 1  8. Primary insomnia Bedtime routine - traZODone  (DESYREL ) 50 MG tablet; Take 2 tablets (100 mg total) by mouth at bedtime.  Dispense: 180 tablet; Refill: 1  9. Idiopathic chronic gout of multiple sites without tophus  10. Severe obesity (BMI >= 40) (HCC) Discussed diet and exercise for person with BMI >25 Will recheck weight in 3-6 months   11. Hyperlipidemia with target LDL less than 100 Low fat diet - rosuvastatin  (CRESTOR ) 5 MG tablet; Take 1 tablet (5 mg total)  by mouth every other day.  Dispense: 45 tablet; Refill: 1 - Lipid panel  12. Osteoarthritis left knee Joint injection today   Labs pending Health Maintenance reviewed Diet and exercise encouraged  Follow up plan: 3 months   Mary-Margaret Gladis, FNP

## 2024-11-25 ENCOUNTER — Other Ambulatory Visit: Payer: Self-pay | Admitting: Nurse Practitioner

## 2024-11-25 DIAGNOSIS — M1A09X0 Idiopathic chronic gout, multiple sites, without tophus (tophi): Secondary | ICD-10-CM

## 2024-11-29 ENCOUNTER — Other Ambulatory Visit: Payer: Self-pay | Admitting: Nurse Practitioner

## 2024-11-29 DIAGNOSIS — I10 Essential (primary) hypertension: Secondary | ICD-10-CM

## 2025-02-10 ENCOUNTER — Ambulatory Visit: Admitting: Nurse Practitioner

## 2025-05-22 ENCOUNTER — Ambulatory Visit: Payer: Self-pay

## 2025-07-06 ENCOUNTER — Ambulatory Visit: Admitting: Urology
